# Patient Record
Sex: Female | Born: 1940
Health system: Southern US, Community
[De-identification: ages and names within clinical notes are randomized; demographics above are authoritative.]

## PROBLEM LIST (undated history)

## (undated) DIAGNOSIS — S43006A Unspecified dislocation of unspecified shoulder joint, initial encounter: Secondary | ICD-10-CM

## (undated) DIAGNOSIS — E785 Hyperlipidemia, unspecified: Secondary | ICD-10-CM

## (undated) DIAGNOSIS — M1710 Unilateral primary osteoarthritis, unspecified knee: Secondary | ICD-10-CM

## (undated) DIAGNOSIS — G4733 Obstructive sleep apnea (adult) (pediatric): Principal | ICD-10-CM

## (undated) DIAGNOSIS — M179 Osteoarthritis of knee, unspecified: Secondary | ICD-10-CM

## (undated) DIAGNOSIS — E119 Type 2 diabetes mellitus without complications: Secondary | ICD-10-CM

## (undated) DIAGNOSIS — I10 Essential (primary) hypertension: Secondary | ICD-10-CM

## (undated) HISTORY — DX: Obstructive sleep apnea (adult) (pediatric): G47.33

## (undated) HISTORY — DX: Unilateral primary osteoarthritis, unspecified knee: M17.10

## (undated) HISTORY — DX: Morbid (severe) obesity due to excess calories: E66.01

## (undated) HISTORY — DX: Type 2 diabetes mellitus without complications: E11.9

## (undated) HISTORY — DX: Unspecified dislocation of unspecified shoulder joint, initial encounter: S43.006A

## (undated) HISTORY — DX: Hyperlipidemia, unspecified: E78.5

## (undated) HISTORY — DX: Essential (primary) hypertension: I10

## (undated) HISTORY — DX: Osteoarthritis of knee, unspecified: M17.9

---

## 1977-11-14 HISTORY — PX: TUBAL LIGATION: SHX77

## 1987-11-15 HISTORY — PX: CHOLECYSTECTOMY: SHX55

## 1998-12-17 ENCOUNTER — Ambulatory Visit (HOSPITAL_COMMUNITY): Admission: RE | Admit: 1998-12-17 | Discharge: 1998-12-17 | Payer: Self-pay | Admitting: Internal Medicine

## 1998-12-17 ENCOUNTER — Encounter: Payer: Self-pay | Admitting: Internal Medicine

## 2000-01-31 ENCOUNTER — Other Ambulatory Visit: Admission: RE | Admit: 2000-01-31 | Discharge: 2000-01-31 | Payer: Self-pay | Admitting: Internal Medicine

## 2000-02-02 ENCOUNTER — Ambulatory Visit (HOSPITAL_COMMUNITY): Admission: RE | Admit: 2000-02-02 | Discharge: 2000-02-02 | Payer: Self-pay | Admitting: Internal Medicine

## 2000-02-02 ENCOUNTER — Encounter: Payer: Self-pay | Admitting: Internal Medicine

## 2002-10-25 ENCOUNTER — Other Ambulatory Visit: Admission: RE | Admit: 2002-10-25 | Discharge: 2002-10-25 | Payer: Self-pay | Admitting: Internal Medicine

## 2005-07-08 ENCOUNTER — Ambulatory Visit (HOSPITAL_COMMUNITY): Admission: RE | Admit: 2005-07-08 | Discharge: 2005-07-08 | Payer: Self-pay | Admitting: Internal Medicine

## 2006-08-11 ENCOUNTER — Ambulatory Visit (HOSPITAL_COMMUNITY): Admission: RE | Admit: 2006-08-11 | Discharge: 2006-08-11 | Payer: Self-pay | Admitting: Internal Medicine

## 2007-04-16 ENCOUNTER — Ambulatory Visit: Payer: Self-pay | Admitting: Gastroenterology

## 2007-04-30 ENCOUNTER — Ambulatory Visit: Payer: Self-pay | Admitting: Gastroenterology

## 2007-04-30 LAB — HM COLONOSCOPY

## 2008-08-18 ENCOUNTER — Ambulatory Visit (HOSPITAL_COMMUNITY): Admission: RE | Admit: 2008-08-18 | Discharge: 2008-08-18 | Payer: Self-pay | Admitting: Internal Medicine

## 2008-08-18 LAB — CONVERTED CEMR LAB

## 2008-09-16 ENCOUNTER — Ambulatory Visit: Payer: Self-pay | Admitting: Internal Medicine

## 2008-09-16 DIAGNOSIS — M545 Low back pain, unspecified: Secondary | ICD-10-CM | POA: Insufficient documentation

## 2008-09-16 DIAGNOSIS — E118 Type 2 diabetes mellitus with unspecified complications: Secondary | ICD-10-CM

## 2008-09-16 DIAGNOSIS — E785 Hyperlipidemia, unspecified: Secondary | ICD-10-CM

## 2008-09-16 DIAGNOSIS — I10 Essential (primary) hypertension: Secondary | ICD-10-CM | POA: Insufficient documentation

## 2008-09-16 LAB — CONVERTED CEMR LAB
AST: 21 units/L (ref 0–37)
Albumin: 4.2 g/dL (ref 3.5–5.2)
Alkaline Phosphatase: 82 units/L (ref 39–117)
BUN: 16 mg/dL (ref 6–23)
Cholesterol: 190 mg/dL (ref 0–200)
Creatinine, Ser: 1 mg/dL (ref 0.4–1.2)
GFR calc Af Amer: 72 mL/min
GFR calc non Af Amer: 59 mL/min
Glucose, Bld: 116 mg/dL — ABNORMAL HIGH (ref 70–99)
Hemoglobin: 14.2 g/dL (ref 12.0–15.0)
Hgb A1c MFr Bld: 6.5 % — ABNORMAL HIGH (ref 4.6–6.0)
LDL Cholesterol: 109 mg/dL — ABNORMAL HIGH (ref 0–99)
Lymphocytes Relative: 32 % (ref 12.0–46.0)
MCHC: 33.1 g/dL (ref 30.0–36.0)
Monocytes Relative: 7.4 % (ref 3.0–12.0)
Neutro Abs: 3.3 10*3/uL (ref 1.4–7.7)
Neutrophils Relative %: 57.7 % (ref 43.0–77.0)
Potassium: 4.2 meq/L (ref 3.5–5.1)
RBC: 5.02 M/uL (ref 3.87–5.11)
RDW: 13.5 % (ref 11.5–14.6)
Total CHOL/HDL Ratio: 3
Total Protein: 7.4 g/dL (ref 6.0–8.3)
VLDL: 17 mg/dL (ref 0–40)

## 2008-09-22 ENCOUNTER — Telehealth: Payer: Self-pay | Admitting: Internal Medicine

## 2008-11-14 HISTORY — PX: KNEE ARTHROSCOPY: SUR90

## 2009-05-11 ENCOUNTER — Encounter: Payer: Self-pay | Admitting: Internal Medicine

## 2009-05-13 ENCOUNTER — Telehealth: Payer: Self-pay | Admitting: Internal Medicine

## 2009-06-08 ENCOUNTER — Telehealth: Payer: Self-pay | Admitting: Internal Medicine

## 2009-06-23 ENCOUNTER — Emergency Department (HOSPITAL_COMMUNITY): Admission: EM | Admit: 2009-06-23 | Discharge: 2009-06-23 | Payer: Self-pay | Admitting: Internal Medicine

## 2009-07-24 ENCOUNTER — Encounter: Admission: RE | Admit: 2009-07-24 | Discharge: 2009-07-24 | Payer: Self-pay | Admitting: Orthopedic Surgery

## 2009-08-02 ENCOUNTER — Encounter: Admission: RE | Admit: 2009-08-02 | Discharge: 2009-08-02 | Payer: Self-pay | Admitting: Orthopedic Surgery

## 2009-11-27 ENCOUNTER — Emergency Department (HOSPITAL_COMMUNITY): Admission: EM | Admit: 2009-11-27 | Discharge: 2009-11-28 | Payer: Self-pay | Admitting: Emergency Medicine

## 2010-02-12 ENCOUNTER — Ambulatory Visit: Payer: Self-pay | Admitting: Internal Medicine

## 2010-02-12 DIAGNOSIS — S43006A Unspecified dislocation of unspecified shoulder joint, initial encounter: Secondary | ICD-10-CM | POA: Insufficient documentation

## 2010-02-12 DIAGNOSIS — M1710 Unilateral primary osteoarthritis, unspecified knee: Secondary | ICD-10-CM

## 2010-02-12 LAB — CONVERTED CEMR LAB
ALT: 18 units/L (ref 0–35)
Alkaline Phosphatase: 84 units/L (ref 39–117)
Basophils Relative: 0.4 % (ref 0.0–3.0)
Bilirubin, Direct: 0.1 mg/dL (ref 0.0–0.3)
CO2: 26 meq/L (ref 19–32)
Eosinophils Absolute: 0.2 10*3/uL (ref 0.0–0.7)
Eosinophils Relative: 3.1 % (ref 0.0–5.0)
GFR calc non Af Amer: 71.18 mL/min (ref >60)
Glucose, Bld: 121 mg/dL — ABNORMAL HIGH (ref 70–99)
HCT: 41.3 % (ref 36.0–46.0)
LDL Cholesterol: 103 mg/dL — ABNORMAL HIGH (ref 0–99)
MCHC: 34 g/dL (ref 30.0–36.0)
MCV: 83.9 fL (ref 78.0–100.0)
Monocytes Absolute: 0.5 10*3/uL (ref 0.1–1.0)
Potassium: 3.5 meq/L (ref 3.5–5.1)
RBC: 4.93 M/uL (ref 3.87–5.11)
Sodium: 134 meq/L — ABNORMAL LOW (ref 135–145)
TSH: 3.12 microintl units/mL (ref 0.35–5.50)
Total Bilirubin: 0.8 mg/dL (ref 0.3–1.2)
VLDL: 24.4 mg/dL (ref 0.0–40.0)
WBC: 6.2 10*3/uL (ref 4.5–10.5)

## 2010-06-04 ENCOUNTER — Telehealth: Payer: Self-pay | Admitting: Internal Medicine

## 2010-06-18 ENCOUNTER — Ambulatory Visit (HOSPITAL_COMMUNITY): Admission: RE | Admit: 2010-06-18 | Discharge: 2010-06-18 | Payer: Self-pay | Admitting: Internal Medicine

## 2010-06-24 ENCOUNTER — Encounter: Payer: Self-pay | Admitting: Internal Medicine

## 2010-06-24 ENCOUNTER — Encounter: Admission: RE | Admit: 2010-06-24 | Discharge: 2010-06-24 | Payer: Self-pay | Admitting: Internal Medicine

## 2010-06-24 LAB — HM MAMMOGRAPHY

## 2010-08-03 ENCOUNTER — Ambulatory Visit: Payer: Self-pay | Admitting: Internal Medicine

## 2010-08-05 ENCOUNTER — Encounter: Payer: Self-pay | Admitting: Internal Medicine

## 2010-08-27 ENCOUNTER — Ambulatory Visit: Payer: Self-pay | Admitting: Internal Medicine

## 2010-08-27 LAB — CONVERTED CEMR LAB
ALT: 34 units/L (ref 0–35)
AST: 26 units/L (ref 0–37)
Alkaline Phosphatase: 95 units/L (ref 39–117)
BUN: 17 mg/dL (ref 6–23)
Bilirubin, Direct: 0.2 mg/dL (ref 0.0–0.3)
Cholesterol: 212 mg/dL — ABNORMAL HIGH (ref 0–200)
Creatinine, Ser: 1.1 mg/dL (ref 0.4–1.2)
GFR calc non Af Amer: 66.44 mL/min (ref >60)
Hgb A1c MFr Bld: 7.6 % — ABNORMAL HIGH (ref 4.6–6.5)
Total Protein: 7.5 g/dL (ref 6.0–8.3)

## 2010-08-29 ENCOUNTER — Encounter: Payer: Self-pay | Admitting: Internal Medicine

## 2010-09-08 ENCOUNTER — Telehealth: Payer: Self-pay | Admitting: Internal Medicine

## 2010-12-14 NOTE — Progress Notes (Signed)
  Phone Note Refill Request Message from:  Fax from Pharmacy on June 04, 2010 8:23 AM  Refills Requested: Medication #1:  METFORMIN HCL 500 MG TABS Take 1 tablet by mouth two times a day  Medication #2:  LISINOPRIL-HYDROCHLOROTHIAZIDE 20-25 MG TABS Take 1 tablet by mouth once a day  Medication #3:  SIMVASTATIN 20 MG TABS Take 1 tablet by mouth once a day    Prescriptions: METFORMIN HCL 500 MG TABS (METFORMIN HCL) Take 1 tablet by mouth two times a day  #180 Each x 3   Entered by:   Ami Bullins CMA   Authorized by:   Jacques Navy MD   Signed by:   Bill Salinas CMA on 06/04/2010   Method used:   Electronically to        [facility] (retail)       [address] [place], [place]  [address]       Ph: [phone]       Fax: [phone]   RxID:   [id number] SIMVASTATIN 20 MG TABS (SIMVASTATIN) Take 1 tablet by mouth once a day  #90 Each x 3   Entered by:   Ami Bullins CMA   Authorized by:   Jacques Navy MD   Signed by:   Bill Salinas CMA on 06/04/2010   Method used:   Electronically to        [facility] (retail)       [address] [place], [place]  [address]       Ph: [phone]       Fax: [phone]   RxID:   [id number] LISINOPRIL-HYDROCHLOROTHIAZIDE 20-25 MG TABS (LISINOPRIL-HYDROCHLOROTHIAZIDE) Take 1 tablet by mouth once a day  #90 Each x 3   Entered by:   Ami Bullins CMA   Authorized by:   Jacques Navy MD   Signed by:   Bill Salinas CMA on 06/04/2010   Method used:   Electronically to        [facility] (retail)       [address] [place], [place]  [address]       Ph: [phone]       Fax: [phone]   RxID:   [id number]

## 2010-12-14 NOTE — Letter (Signed)
[facility] [address] [place], [place]  [address] Phone: [phone]      September 02, 2010   [facility] [address] [place], [place] [address]  RE:  LAB RESULTS  Dear  Ms. Muckle,  The following is an interpretation of your most recent lab tests.  Please take note of any instructions provided or changes to medications that have resulted from your lab work.  ELECTROLYTES:  Good - no changes needed  KIDNEY FUNCTION TESTS:  Good - no changes needed  Health professionals look at cholesterol as more involved than just the total cholesterol. We consider the level of LDL (bad) cholesterol, HDL (good), cholesterol, and Triglycerides (Grease) in the blood.  1. Your LDL should be under 100, and the HDL should be over 45, if you have any vascular disease such as heart attack, angina, stroke, TIA (mini stroke), claudication (pain in the legs when you walk due to poor circulation),  Abdominal Aortic Aneurysm (AAA), diabetes or prediabetes.  2. Your LDL should be under 130 if you have any two of the following:     a. Smoke or chew tobacco,     b. High blood pressure (if you are on medication or over 140/90 without medication),     c. Female gender,    d. HDL below 40,    e. A female relative (father, brother, or son), who have had any vascular event          as described in #1. above under the age of 69, or a female relative (mother,       sister, or daughter) who had an event as described above under age 60. (An HDL over 60 will subtract one risk factor from the total, so if you have two items in # 2 above, but an HDL over 60, you then fall into category # 3 below).  3. Your LDL should be under 160 if you have any one of the above.  Triglycerides should be under 200 with the ideal being under 150.  For diabetes or pre-diabetes, the ideal HgbA1C should be under 6.0%.  If you fall into any of the above categories, you should make a follow up appointment to  discuss this with your physician.  LIPID PANEL:  Please see the enclosed Rx for a change in medication Triglyceride: 128.0   Cholesterol: 212   LDL: 103   HDL: 58.70   Chol/HDL%:  4   DIABETIC STUDIES:  Please see the enclosed Rx for a change in medication Blood Glucose: 128   HgbA1C: 7.6      A1C is too high and cholesterol is not well controlled. Will increase simvastatin to 40mg  once a day for better cholesterol control and increase metformin to  1000mg  two times a day for better diabetes control. Of course you need to continue to work on life-style management with diet and exercise. Will repeat lab work in 3 months.   You may double up the doses on the medications you have left and new Rx's have been eScribed to your pharmacy.  Please come see me if you have any questions about these lab results.   Sincerely Yours,    Jacques Navy MD  Patient: Holly [name] Note: All result statuses are Final unless otherwise noted.  Tests: (1) Lipid Panel (LIPID)   Cholesterol          [H]  212 mg/dL  0-200     ATP III Classification            Desirable:  < 200 mg/dL                    Borderline High:  200 - 239 mg/dL               High:  > = 240 mg/dL   Triglycerides             128.0 mg/dL                 4.6-962.9     Normal:  <150 mg/dL     Borderline High:  528 - 199 mg/dL   HDL                       41.32 mg/dL                 >44.01   VLDL Cholesterol          25.6 mg/dL                  0.2-72.5  CHO/HDL Ratio:  CHD Risk                             4                    Men          Women     1/2 Average Risk     3.4          3.3     Average Risk          5.0          4.4     2X Average Risk          9.6          7.1     3X Average Risk          15.0          11.0                           Tests: (2) Hepatic/Liver Function Panel (HEPATIC)   Total Bilirubin      [H]  1.8 mg/dL                   3.6-6.4   Direct Bilirubin          0.2 mg/dL                    4.0-3.4   Alkaline Phosphatase      95 U/L                      39-117   AST                       26 U/L                      0-37   ALT                       34 U/L                      0-35   Total  Protein             7.5 g/dL                    5.5-7.3   Albumin                   4.1 g/dL                    2.2-0.2  Tests: (3) BMP (METABOL)   Sodium                    137 mEq/L                   135-145   Potassium                 4.1 mEq/L                   3.5-5.1   Chloride                  102 mEq/L                   96-112   Carbon Dioxide            27 mEq/L                    19-32   Glucose              [H]  128 mg/dL                   54-27   BUN                       17 mg/dL                    0-62   Creatinine                1.1 mg/dL                   3.7-6.2   Calcium                   9.6 mg/dL                   8.3-15.1   GFR                       66.44 mL/min                >60  Tests: (4) Hemoglobin A1C (A1C)   Hemoglobin A1C       [H]  7.6 %                       4.6-6.5     Glycemic Control Guidelines for People with Diabetes:     Non Diabetic:  <6%     Goal of Therapy: <7%     Additional Action Suggested:  >8%   Tests: (5) Cholesterol LDL - Direct (DIRLDL)  Cholesterol LDL - Direct                             134.8 mg/dL Prescriptions: SIMVASTATIN 40 MG TABS (SIMVASTATIN) 1 by mouth qPM for cholesterol control  #30 x 12   Entered and Authorized by:  Jacques Navy MD   Signed by:   Jacques Navy MD on 08/29/2010   Method used:   Electronically to        [facility] [address] (retail)       [phone]       [place], [place]  [address]       Ph: [phone]       Fax: [phone]   RxID:   [id number] METFORMIN HCL 1000 MG TABS (METFORMIN HCL) 1 by mouth two times a day for diabetes control  #60 x 12   Entered and Authorized by:   Jacques Navy MD   Signed by:   Jacques Navy MD on  08/29/2010   Method used:   Electronically to        [facility] [address] (retail)       [phone]       [place], [place]  [address]       Ph: [phone]       Fax: [phone]   RxID:   [id number]

## 2010-12-14 NOTE — Assessment & Plan Note (Signed)
Summary: YEARLY FU/ [facility] GOLD/  LABS SAME DAY OR LATER/NWS  #    Vital Signs:  Patient profile:   70 year old female Height:      69.5 inches (176.53 cm) Weight:      326.50 pounds (148.41 kg) BMI:     47.70 O2 Sat:      94 % on Room air Temp:     97.8 degrees F (36.56 degrees C) oral Pulse rate:   84 / minute BP sitting:   112 / 70  (left arm) Cuff size:   large  Vitals Entered By: Bill Salinas CMA (February 12, 2010 11:08 AM)/ Sydell Axon, SMA  O2 Flow:  Room air CC: 1 year follow up, pt had a flu shot 08/2009, pt states she had pneumovax at last visit, pt never had bone density, last mammogram was 09/02/08, last pap 08/18/08, per pt, eye exam is overdue. //[place]   Primary Care Provider:  Jacques Navy MD  CC:  1 year follow up, pt had a flu shot 08/2009, pt states she had pneumovax at last visit, pt never had bone density, last mammogram was 09/02/08, last pap 08/18/08, per pt, and eye exam is overdue. //[place].  History of Present Illness: Patients for follow-up of HTN, Lipids, DM. she was last seen in '09. she does monitor her BP at home which has been 120/70s, CBGs 130-90 most days. She has not had any health maintenance _ no mammogram and no labs. She does report having an appt for mammogram next week.     She had arthrsocopy left knee Sept '10 (Dr. Lajoyce Corners) - torn cartilage. She fell and sustained dislocated right shoulder requiring reduction under anesthesia [facility]). She had PT and has recovered normal range of motion.   Current Medications (verified): 1)  Claritin 10 Mg Tabs (Loratadine) .... Take 1 Tablet By Mouth Once A Day 2)  Lisinopril-Hydrochlorothiazide 20-25 Mg Tabs (Lisinopril-Hydrochlorothiazide) .... Take 1 Tablet By Mouth Once A Day 3)  Simvastatin 20 Mg Tabs (Simvastatin) .... Take 1 Tablet By Mouth Once A Day 4)  Metformin Hcl 500 Mg Tabs (Metformin Hcl) .... Take 1 Tablet By Mouth Two Times A Day 5)  Aspirin Adult Low Strength 81 Mg Tbec (Aspirin) .... Take 1  Tablet By Mouth Once A Day 6)  Vitamin D 400 Unit Tabs (Cholecalciferol) .... Take 1 Tablet By Mouth Once A Day 7)  Multivitamins   Tabs (Multiple Vitamin) .... Take 1 Tablet By Mouth Once A Day  Allergies (verified): 1)  ! Pcn  Past History:  Past Medical History: CLOSED DISLOCATION OF SHOULDER UNSPECIFIED SITE (ICD-831.00) DEGENERATIVE JOINT DISEASE, LEFT KNEE (ICD-715.96) ROUTINE GENERAL MEDICAL EXAM@HEALTH  CARE FACL (ICD-V70.0) OBESITY, CLASS III (ICD-278.01) LOW BACK PAIN, CHRONIC (ICD-724.2) HYPERTENSION (ICD-401.9) HYPERLIPIDEMIA (ICD-272.4) DIABETES-TYPE 2 (ICD-250.00)    G2P2  Past Surgical History: Tubal ligation (2376) Cholecystectomy (1989) Arthoscopy left Knee '10 Lajoyce Corners)  Family History: Reviewed history from 09/16/2008 and no changes required. Father died of kidney failure Mother died of massive MI one brother survived ruptured AAA second brother died from head and neck malignancy  Social History: Retired; worked on the [employer] at [employer], then at [employer] at [employer] married for 23 years, currently divorced  has a daughter born '65 and a son born '69; 2 grandchildren - 1 here, 1 in [place] exercises x 3days/week  Review of Systems       lost about 10 lbs.  Physical Exam  General:  Well-developed,well-nourished,in no acute distress; alert,appropriate and cooperative throughout examination Head:  Normocephalic and atraumatic without obvious abnormalities. No apparent alopecia or balding. Eyes:  vision grossly intact, pupils equal, pupils round, corneas and lenses clear, and no injection.   Ears:  R ear normal and L ear normal.   Nose:  no external deformity and no external erythema.   Mouth:  Oral mucosa and oropharynx without lesions or exudates.  Teeth in good repair. Neck:  supple, no masses, and no thyromegaly.   Chest Wall:  no deformities.   Breasts:  No mass, nodules, thickening, tenderness, bulging, retraction,  inflamation, nipple discharge or skin changes noted.   Lungs:  normal respiratory effort and normal breath sounds.   Heart:  normal rate, regular rhythm, and no murmur.   Abdomen:  soft, non-tender, and normal bowel sounds.   Msk:  normal ROM, no joint tenderness, no joint swelling, no joint warmth, and no joint deformities.   Pulses:  2+ radial Extremities:  No clubbing, cyanosis, edema, or deformity noted with normal full range of motion of all joints.   Neurologic:  alert & oriented X3, cranial nerves II-XII intact, strength normal in all extremities, gait normal, and DTRs symmetrical and normal.   Skin:  turgor normal, color normal, no rashes, and no suspicious lesions.   Cervical Nodes:  no anterior cervical adenopathy and no posterior cervical adenopathy.   Psych:  Oriented X3, memory intact for recent and remote, normally interactive, and good eye contact.     Impression & Recommendations:  Problem # 1:  CLOSED DISLOCATION OF SHOULDER UNSPECIFIED SITE (ICD-831.00) Good recovery  Problem # 2:  DEGENERATIVE JOINT DISEASE, LEFT KNEE (ICD-715.96) Doing well. Will check CBC since on asa.  Her updated medication list for this problem includes:    Aspirin Adult Low Strength 81 Mg Tbec (Aspirin) .Marland Kitchen... Take 1 tablet by mouth once a day  Orders: TLB-CBC Platelet - w/Differential (85025-CBCD)  Addendum - normal lab.  Problem # 3:  HYPERTENSION (ICD-401.9)  Her updated medication list for this problem includes:    Lisinopril-hydrochlorothiazide 20-25 Mg Tabs (Lisinopril-hydrochlorothiazide) .Marland Kitchen... Take 1 tablet by mouth once a day  Orders: TLB-BMP (Basic Metabolic Panel-BMET) (80048-METABOL)  BP today: 112/70 Prior BP: 138/84 (09/16/2008)  Excellent control.  Plan continue present regimen  Problem # 4:  HYPERLIPIDEMIA (ICD-272.4)  Her updated medication list for this problem includes:    Simvastatin 20 Mg Tabs (Simvastatin) .Marland Kitchen... Take 1 tablet by mouth once a  day  Orders: TLB-Lipid Panel (80061-LIPID) TLB-Hepatic/Liver Function Pnl (80076-HEPATIC) TLB-TSH (Thyroid Stimulating Hormone) (84443-TSH)  Addendum - LDL very close to goal of 440. No need to change medication. Needs to follow careful low fat diet.   Problem # 5:  DIABETES-TYPE 2 (ICD-250.00)  Her updated medication list for this problem includes:    Lisinopril-hydrochlorothiazide 20-25 Mg Tabs (Lisinopril-hydrochlorothiazide) .Marland Kitchen... Take 1 tablet by mouth once a day    Metformin Hcl 500 Mg Tabs (Metformin hcl) .Marland Kitchen... Take 1 tablet by mouth two times a day    Aspirin Adult Low Strength 81 Mg Tbec (Aspirin) .Marland Kitchen... Take 1 tablet by mouth once a day  Orders: TLB-A1C / Hgb A1C (Glycohemoglobin) (83036-A1C)  Addendum - A1C 6.6% indicating good control on present regimen.  Problem # 6:  Preventive Health Care (ICD-V70.0) Unremarkable history except for ortho. Normal limited physical exam. Lab  results within normal limits with no derangements. She is scheduled, by her report, for mammography. Last colonosocpy in '08.  In summary -  a nice woman with well controlled chronic medical problems. She is asked to return in 6 months and to keep a twice a year visit schedule. She is adamantly encouraged to get an opthal exam and to keep her appt for mammography.  Complete Medication List: 1)  Claritin 10 Mg Tabs (Loratadine) .... Take 1 tablet by mouth once a day 2)  Lisinopril-hydrochlorothiazide 20-25 Mg Tabs (Lisinopril-hydrochlorothiazide) .... Take 1 tablet by mouth once a day 3)  Simvastatin 20 Mg Tabs (Simvastatin) .... Take 1 tablet by mouth once a day 4)  Metformin Hcl 500 Mg Tabs (Metformin hcl) .... Take 1 tablet by mouth two times a day 5)  Aspirin Adult Low Strength 81 Mg Tbec (Aspirin) .... Take 1 tablet by mouth once a day 6)  Vitamin D 400 Unit Tabs (Cholecalciferol) .... Take 1 tablet by mouth once a day 7)  Multivitamins Tabs (Multiple vitamin) .... Take 1 tablet by mouth once a  day  Patient: Holly [name] Note: All result statuses are Final unless otherwise noted.  Tests: (1) BMP (METABOL)   Sodium               [L]  134 mEq/L                   135-145   Potassium                 3.5 mEq/L                   3.5-5.1   Chloride                  101 mEq/L                   96-112   Carbon Dioxide            26 mEq/L                    19-32   Glucose              [H]  121 mg/dL                   57-84   BUN                       17 mg/dL                    6-96   Creatinine                1.0 mg/dL                   2.9-5.2   Calcium                   9.6 mg/dL                   8.4-13.2   GFR                       71.18 mL/min                >60  Tests: (2) Lipid Panel (LIPID)   Cholesterol               188 mg/dL                   4-401     ATP  III Classification            Desirable:  < 200 mg/dL                    Borderline High:  200 - 239 mg/dL               High:  > = 240 mg/dL   Triglycerides             122.0 mg/dL                 7.3-220.2     Normal:  <150 mg/dL     Borderline High:  542 - 199 mg/dL   HDL                       70.62 mg/dL                 >37.62   VLDL Cholesterol          24.4 mg/dL                  8.3-15.1   LDL Cholesterol      [H]  761 mg/dL                   6-07  CHO/HDL Ratio:  CHD Risk                             3                    Men          Women     1/2 Average Risk     3.4          3.3     Average Risk          5.0          4.4     2X Average Risk          9.6          7.1     3X Average Risk          15.0          11.0                           Tests: (3) Hepatic/Liver Function Panel (HEPATIC)   Total Bilirubin           0.8 mg/dL                   3.7-1.0   Direct Bilirubin          0.1 mg/dL                   6.2-6.9   Alkaline Phosphatase      84 U/L                      39-117   AST                       19 U/L                      0-37   ALT  18 U/L                      0-35    Total Protein             7.8 g/dL                    6.2-1.3   Albumin                   4.1 g/dL                    0.8-6.5  Tests: (4) Hemoglobin A1C (A1C)   Hemoglobin A1C       [H]  6.6 %                       4.6-6.5     Glycemic Control Guidelines for People with Diabetes:     Non Diabetic:  <6%     Goal of Therapy: <7%     Additional Action Suggested:  >8%   Tests: (5) CBC Platelet w/Diff (CBCD)   White Cell Count          6.2 K/uL                    4.5-10.5   Red Cell Count            4.93 Mil/uL                 3.87-5.11   Hemoglobin                14.1 g/dL                   78.4-69.6   Hematocrit                41.3 %                      36.0-46.0   MCV                       83.9 fl                     78.0-100.0   MCHC                      34.0 g/dL                   29.5-28.4   RDW                       14.5 %                      11.5-14.6   Platelet Count            237.0 K/uL                  150.0-400.0   Neutrophil %              56.3 %                      43.0-77.0   Lymphocyte %              31.8 %  12.0-46.0   Monocyte %                8.4 %                       3.0-12.0   Eosinophils%              3.1 %                       0.0-5.0   Basophils %               0.4 %                       0.0-3.0   Neutrophill Absolute      3.5 K/uL                    1.4-7.7   Lymphocyte Absolute       2.0 K/uL                    0.7-4.0   Monocyte Absolute         0.5 K/uL                    0.1-1.0  Eosinophils, Absolute                             0.2 K/uL                    0.0-0.7   Basophils Absolute        0.0 K/uL                    0.0-0.1  Tests: (6) TSH (TSH)   FastTSH                   3.12 uIU/mL                 0.35-5.50

## 2010-12-14 NOTE — Progress Notes (Signed)
  Phone Note Call from Patient   Summary of Call: Patient is requesting a call back regarding cholesterol medication. Initial call taken by: Lamar Sprinkles, CMA,  September 08, 2010 9:09 AM  Follow-up for Phone Call        Clarified questions. Needs rx to go to mail order pharmacy Follow-up by: Lamar Sprinkles, CMA,  September 08, 2010 10:39 AM    Prescriptions: METFORMIN HCL 1000 MG TABS (METFORMIN HCL) 1 by mouth two times a day for diabetes control  #180 x 3   Entered by:   Lamar Sprinkles, CMA   Authorized by:   Jacques Navy MD   Signed by:   Lamar Sprinkles, CMA on 09/08/2010   Method used:   Faxed to ...       [facility] (mail-order)             , [place]         Ph: [phone]       Fax: [phone]   RxID:   [id number] SIMVASTATIN 40 MG TABS (SIMVASTATIN) 1 by mouth qPM for cholesterol control  #90 x 3   Entered by:   Lamar Sprinkles, CMA   Authorized by:   Jacques Navy MD   Signed by:   Lamar Sprinkles, CMA on 09/08/2010   Method used:   Faxed to ...       [facility] (mail-order)             , [place]         Ph: [phone]       Fax: [phone]   RxID:   [id number]

## 2010-12-14 NOTE — Progress Notes (Signed)
Summary: Jury Duty  Phone Note Call from Patient Call back at [facility] Phone [phone]   Summary of Call: Pt has jury summons. She wants to get out of it b/c she walks w/a limp from her knee surgery and has trouble w/steps and walking long distances.  Initial call taken by: Lamar Sprinkles, CMA,  September 08, 2010 10:40 AM  Follow-up for Phone Call        the courts will be glad to accomodate her. The courthouse has ramps.  Follow-up by: Jacques Navy MD,  September 08, 2010 2:01 PM  Additional Follow-up for Phone Call Additional follow up Details #1::        Pt informed  Additional Follow-up by: Lamar Sprinkles, CMA,  September 09, 2010 4:02 PM

## 2010-12-14 NOTE — Consult Note (Signed)
Summary: [facility]  [facility]   Imported By: Sherian Rein 08/20/2010 08:05:29  _____________________________________________________________________  External Attachment:    Type:   Image     Comment:   External Document

## 2010-12-14 NOTE — Assessment & Plan Note (Signed)
Summary: ?rash on inner leg/Sd   Vital Signs:  Patient profile:   70 year old female Height:      69.5 inches Weight:      334 pounds BMI:     48.79 O2 Sat:      97 % on Room air Temp:     97.3 degrees F oral Pulse rate:   76 / minute BP sitting:   126 / 78  (left arm) Cuff size:   large  Vitals Entered By: Ami Bullins CMA (August 03, 2010 11:23 AM)  O2 Flow:  Room air CC: pt has blotches with itching on both lower legs x 2 weeks/ ab   Primary Care Provider:  Jacques Navy MD  CC:  pt has blotches with itching on both lower legs x 2 weeks/ ab.  History of Present Illness: Patient presents with a 2 week history of a pruritic rash that started on the medial right ankle, extended up the leg to mid-calve and now involves the medial aspect distal left LE. This is very pruritic. she has not used any new creams or lotions or stockings or detergents. No pain.   Current Medications (verified): 1)  Claritin 10 Mg Tabs (Loratadine) .... Take 1 Tablet By Mouth Once A Day 2)  Lisinopril-Hydrochlorothiazide 20-25 Mg Tabs (Lisinopril-Hydrochlorothiazide) .... Take 1 Tablet By Mouth Once A Day 3)  Simvastatin 20 Mg Tabs (Simvastatin) .... Take 1 Tablet By Mouth Once A Day 4)  Metformin Hcl 500 Mg Tabs (Metformin Hcl) .... Take 1 Tablet By Mouth Two Times A Day 5)  Aspirin Adult Low Strength 81 Mg Tbec (Aspirin) .... Take 1 Tablet By Mouth Once A Day 6)  Vitamin D 400 Unit Tabs (Cholecalciferol) .... Take 1 Tablet By Mouth Once A Day 7)  Multivitamins   Tabs (Multiple Vitamin) .... Take 1 Tablet By Mouth Once A Day  Allergies (verified): 1)  ! Pcn  Past History:  Past Medical History: Last updated: 02/12/2010 CLOSED DISLOCATION OF SHOULDER UNSPECIFIED SITE (ICD-831.00) DEGENERATIVE JOINT DISEASE, LEFT KNEE (ICD-715.96) ROUTINE GENERAL MEDICAL EXAM@HEALTH  CARE FACL (ICD-V70.0) OBESITY, CLASS III (ICD-278.01) LOW BACK PAIN, CHRONIC (ICD-724.2) HYPERTENSION  (ICD-401.9) HYPERLIPIDEMIA (ICD-272.4) DIABETES-TYPE 2 (ICD-250.00)    G2P2  Past Surgical History: Last updated: 02/12/2010 Tubal ligation (1610) Cholecystectomy (1989) Arthoscopy left Knee '10 ([facility]) PSH reviewed for relevance, FH reviewed for relevance  Review of Systems       The patient complains of suspicious skin lesions.  The patient denies anorexia, fever, weight loss, chest pain, dyspnea on exertion, abdominal pain, unusual weight change, and enlarged lymph nodes.    Physical Exam  General:  heavyset AA female in no distress Head:  normocephalic and atraumatic.   Eyes:  corneas and lenses clear.   Lungs:  normal respiratory effort.   Heart:  normal rate and regular rhythm.   Skin:  macular rash medial aspect right ankle to mid-calve; similar rash left leg butit extend higher. NO open lesions or vesicles.   Impression & Recommendations:  Problem # 1:  UNSPECIFIED PRURITIC DISORDER (ICD-698.9) Patient with pruritic rash.  Plan Loratdine 10mg  two times a day (stop zyrtec) and ranitidine 150mg  two times a day for itching        refer to dermatology.   Orders: Dermatology Referral ([facility])  Complete Medication List: 1)  Claritin 10 Mg Tabs (Loratadine) .... Take 1 tablet by mouth once a day 2)  Lisinopril-hydrochlorothiazide 20-25 Mg Tabs (Lisinopril-hydrochlorothiazide) .... Take 1 tablet by mouth once a  day 3)  Simvastatin 20 Mg Tabs (Simvastatin) .... Take 1 tablet by mouth once a day 4)  Metformin Hcl 500 Mg Tabs (Metformin hcl) .... Take 1 tablet by mouth two times a day 5)  Aspirin Adult Low Strength 81 Mg Tbec (Aspirin) .... Take 1 tablet by mouth once a day 6)  Vitamin D 400 Unit Tabs (Cholecalciferol) .... Take 1 tablet by mouth once a day 7)  Multivitamins Tabs (Multiple vitamin) .... Take 1 tablet by mouth once a day  Patient Instructions: 1)  Itching rash on legs - will arrange referral to a dermatologist to help make the diagnosis and recommend  treatment. For itching take over the counter loratadine 10mg  two times a day and rantidine 150mg  two times a day. Cool soaks will also help. Continue all other meds.    Immunization History:  Influenza Immunization History:    Influenza:  historical (07/26/2010)

## 2010-12-14 NOTE — Assessment & Plan Note (Signed)
Summary: 6 month follow up-lb   Vital Signs:  Patient profile:   70 year old female Height:      69.5 inches Weight:      335 pounds BMI:     48.94 O2 Sat:      95 % on Room air Temp:     97.9 degrees F oral Pulse rate:   73 / minute BP sitting:   132 / 92  (left arm) Cuff size:   large  Vitals Entered By: Bill Salinas CMA (August 27, 2010 10:58 AM)  O2 Flow:  Room air CC: pt here for 6 month follow up/ ab   Primary Care Ruthann Angulo:  Jacques Navy MD  CC:  pt here for 6 month follow up/ ab.  History of Present Illness: Patinet reurns for medical follow-up with her last full exam in A;pril '11. In the interval she had a rash which dermatology dx'd as eczematous-dermatitis and is treating with traimcinolone 0.1% cream with good results. She is still having itching.  She has otherwise been healthy. No problems with medications. She is due for follow-up lab today.   Current Medications (verified): 1)  Claritin 10 Mg Tabs (Loratadine) .... Take 1 Tablet By Mouth Once A Day 2)  Lisinopril-Hydrochlorothiazide 20-25 Mg Tabs (Lisinopril-Hydrochlorothiazide) .... Take 1 Tablet By Mouth Once A Day 3)  Simvastatin 20 Mg Tabs (Simvastatin) .... Take 1 Tablet By Mouth Once A Day 4)  Metformin Hcl 500 Mg Tabs (Metformin Hcl) .... Take 1 Tablet By Mouth Two Times A Day 5)  Aspirin Adult Low Strength 81 Mg Tbec (Aspirin) .... Take 1 Tablet By Mouth Once A Day 6)  Vitamin D 400 Unit Tabs (Cholecalciferol) .... Take 1 Tablet By Mouth Once A Day 7)  Multivitamins   Tabs (Multiple Vitamin) .... Take 1 Tablet By Mouth Once A Day  Allergies (verified): 1)  ! Pcn  Past History:  Past Medical History: Last updated: 02/12/2010 CLOSED DISLOCATION OF SHOULDER UNSPECIFIED SITE (ICD-831.00) DEGENERATIVE JOINT DISEASE, LEFT KNEE (ICD-715.96) ROUTINE GENERAL MEDICAL EXAM@HEALTH  CARE FACL (ICD-V70.0) OBESITY, CLASS III (ICD-278.01) LOW BACK PAIN, CHRONIC (ICD-724.2) HYPERTENSION  (ICD-401.9) HYPERLIPIDEMIA (ICD-272.4) DIABETES-TYPE 2 (ICD-250.00)    G2P2  Past Surgical History: Last updated: 02/12/2010 Tubal ligation (1979) Cholecystectomy (1989) Arthoscopy left Knee '10 ([facility]) PSH reviewed for relevance, FH reviewed for relevance  Review of Systems  The patient denies anorexia, fever, weight loss, weight gain, decreased hearing, hoarseness, chest pain, dyspnea on exertion, prolonged cough, hemoptysis, abdominal pain, hematuria, muscle weakness, difficulty walking, and abnormal bleeding.    Physical Exam  General:  verweight woman in no distress Head:  normocephalic and no abnormalities observed.   Eyes:  vision grossly intact, pupils equal, and pupils round.   Ears:  R ear normal and L ear normal.   Nose:  no external deformity and no external erythema.   Neck:  supple and no thyromegaly.   Lungs:  normal respiratory effort and normal breath sounds.   Heart:  normal rate and regular rhythm.   Abdomen:  soft and normal bowel sounds.   Msk:  normal ROM, no joint tenderness, no joint warmth, and no redness over joints.   Pulses:  2+ radial Extremities:  no edema, no deformity Neurologic:  alert & oriented X3, cranial nerves II-XII intact, gait normal, and DTRs symmetrical and normal.   Skin:  turgor normal and color normal.   Cervical Nodes:  no anterior cervical adenopathy and no posterior cervical adenopathy.   Psych:  Oriented X3, normally interactive, and good eye contact.     Impression & Recommendations:  Problem # 1:  LOW BACK PAIN, CHRONIC (ICD-724.2) Stable. No limitation in ADLs  Her updated medication list for this problem includes:    Aspirin Adult Low Strength 81 Mg Tbec (Aspirin) .Marland Kitchen... Take 1 tablet by mouth once a day  Problem # 2:  HYPERTENSION (ICD-401.9)  Her updated medication list for this problem includes:    Lisinopril-hydrochlorothiazide 20-25 Mg Tabs (Lisinopril-hydrochlorothiazide) .Marland Kitchen... Take 1 tablet by mouth once a  day  Orders: TLB-BMP (Basic Metabolic Panel-BMET) (80048-METABOL)  BP today: 132/92 Prior BP: 126/78 (08/03/2010)  adequate control on present medications.  Problem # 3:  HYPERLIPIDEMIA (ICD-272.4) Due for routine follow-up lab with recommendations to follow.   Her updated medication list for this problem includes:    Simvastatin 40 Mg Tabs (Simvastatin) .Marland Kitchen... 1 by mouth qpm for cholesterol control  Orders: TLB-Lipid Panel (80061-LIPID) TLB-Hepatic/Liver Function Pnl (80076-HEPATIC)  Addendum - LDL 134.8 - above goal of 161 or less.   Plan - will increase simvastatin to 40mg  once daily.  Problem # 4:  DIABETES-TYPE 2 (ICD-250.00) Due for follow-up lab with recomendations to follow.  Her updated medication list for this problem includes:    Lisinopril-hydrochlorothiazide 20-25 Mg Tabs (Lisinopril-hydrochlorothiazide) .Marland Kitchen... Take 1 tablet by mouth once a day    Metformin Hcl 1000 Mg Tabs (Metformin hcl) .Marland Kitchen... 1 by mouth two times a day for diabetes control    Aspirin Adult Low Strength 81 Mg Tbec (Aspirin) .Marland Kitchen... Take 1 tablet by mouth once a day  Orders: TLB-BMP (Basic Metabolic Panel-BMET) (80048-METABOL) TLB-A1C / Hgb A1C (Glycohemoglobin) (83036-A1C)  Addendum - A1C 7.6% - above goal of 7% or less.  Plan - increase metformin to 1g two times a day.   Complete Medication List: 1)  Claritin 10 Mg Tabs (Loratadine) .... Take 1 tablet by mouth once a day 2)  Lisinopril-hydrochlorothiazide 20-25 Mg Tabs (Lisinopril-hydrochlorothiazide) .... Take 1 tablet by mouth once a day 3)  Simvastatin 40 Mg Tabs (Simvastatin) .Marland Kitchen.. 1 by mouth qpm for cholesterol control 4)  Metformin Hcl 1000 Mg Tabs (Metformin hcl) .Marland Kitchen.. 1 by mouth two times a day for diabetes control 5)  Aspirin Adult Low Strength 81 Mg Tbec (Aspirin) .... Take 1 tablet by mouth once a day 6)  Vitamin D 400 Unit Tabs (Cholecalciferol) .... Take 1 tablet by mouth once a day 7)  Multivitamins Tabs (Multiple vitamin) ....  Take 1 tablet by mouth once a day

## 2011-01-28 LAB — HM DIABETES FOOT EXAM: HM Diabetic Foot Exam: DECREASED

## 2011-02-28 ENCOUNTER — Other Ambulatory Visit (INDEPENDENT_AMBULATORY_CARE_PROVIDER_SITE_OTHER): Payer: Medicare PPO

## 2011-02-28 ENCOUNTER — Ambulatory Visit (HOSPITAL_COMMUNITY)
Admission: RE | Admit: 2011-02-28 | Discharge: 2011-02-28 | Disposition: A | Discharge status: Home / Self Care | Payer: Medicare PPO | Source: Ambulatory Visit | Attending: Internal Medicine | Admitting: Internal Medicine

## 2011-02-28 ENCOUNTER — Ambulatory Visit (INDEPENDENT_AMBULATORY_CARE_PROVIDER_SITE_OTHER): Payer: Medicare PPO | Admitting: Internal Medicine

## 2011-02-28 DIAGNOSIS — M25569 Pain in unspecified knee: Secondary | ICD-10-CM

## 2011-02-28 DIAGNOSIS — I10 Essential (primary) hypertension: Secondary | ICD-10-CM

## 2011-02-28 DIAGNOSIS — E785 Hyperlipidemia, unspecified: Secondary | ICD-10-CM

## 2011-02-28 DIAGNOSIS — E119 Type 2 diabetes mellitus without complications: Secondary | ICD-10-CM

## 2011-02-28 DIAGNOSIS — M25561 Pain in right knee: Secondary | ICD-10-CM

## 2011-02-28 DIAGNOSIS — M2569 Stiffness of other specified joint, not elsewhere classified: Secondary | ICD-10-CM | POA: Insufficient documentation

## 2011-02-28 LAB — COMPREHENSIVE METABOLIC PANEL
AST: 22 U/L (ref 0–37)
Albumin: 3.9 g/dL (ref 3.5–5.2)
Alkaline Phosphatase: 76 U/L (ref 39–117)
BUN: 20 mg/dL (ref 6–23)
Glucose, Bld: 102 mg/dL — ABNORMAL HIGH (ref 70–99)
Potassium: 4 mEq/L (ref 3.5–5.1)
Total Bilirubin: 1.4 mg/dL — ABNORMAL HIGH (ref 0.3–1.2)

## 2011-02-28 LAB — HEMOGLOBIN A1C: Hgb A1c MFr Bld: 7.1 % — ABNORMAL HIGH (ref 4.6–6.5)

## 2011-02-28 LAB — LIPID PANEL
Cholesterol: 193 mg/dL (ref 0–200)
HDL: 58.3 mg/dL (ref >39.00)
LDL Cholesterol: 108 mg/dL — ABNORMAL HIGH (ref 0–99)
Total CHOL/HDL Ratio: 3
VLDL: 26.8 mg/dL (ref 0.0–40.0)

## 2011-02-28 LAB — HEPATIC FUNCTION PANEL
ALT: 21 U/L (ref 0–35)
Alkaline Phosphatase: 76 U/L (ref 39–117)
Total Protein: 7.2 g/dL (ref 6.0–8.3)

## 2011-02-28 MED ORDER — METHYLPREDNISOLONE ACETATE 80 MG/ML IJ SUSP
80.0000 mg | Freq: Once | INTRAMUSCULAR | Status: AC
  Administered 2011-02-28: 80 mg via INTRA_ARTICULAR

## 2011-02-28 NOTE — Progress Notes (Signed)
Subjective:    Patient ID: Holly [name], female    DOB: Nov 24, 1940, 70 y.o.   MRN: [id number]  HPI Holly [name] presents for acute right knee pain over the past 6 months and is progressive. She has had a problem in the past after an injury and had good results with a steroid injection. She has tried OTC NSAIDs without much relief; tried lineaments. She does not have pain with weight bearing but it is hard to get up and down. There has been no swelling.   She reports that her DM has been variable controlled - which she attributes to decrease in exercise.     Review of Systems  Constitutional: Negative.   HENT: Negative.   Eyes: Negative.   Respiratory: Negative.  Negative for cough, shortness of breath and wheezing.   Cardiovascular: Negative.  Negative for chest pain and palpitations.  Gastrointestinal: Negative.   Musculoskeletal:       [Severe right knee pain without swelling Skin: Negative.   Neurological: Negative for dizziness, tremors, weakness and headaches.  Hematological: Negative.   Psychiatric/Behavioral: Negative.        Objective:   Physical Exam  Constitutional: She is oriented to person, place, and time.       Obese AA female in distress due to knee pain.   HENT:  Head: Normocephalic and atraumatic.  Eyes: Conjunctivae and EOM are normal. Pupils are equal, round, and reactive to light.  Neck: Normal range of motion. Neck supple. No thyromegaly present.  Cardiovascular: Normal rate, regular rhythm and normal heart sounds.   Pulmonary/Chest: Effort normal and breath sounds normal. She has no wheezes.  Musculoskeletal:       Right knee without effusion, erythema. ROM is 90% of normal with some limitation in full extension. Minor crepitus is noted. No instability of the knee  Neurological: She is alert and oriented to person, place, and time. She has normal reflexes.  Skin: Skin is warm and dry.  Psychiatric: She has a normal mood and affect. Her behavior is  normal.    Procedure Note :     Procedure :Joint Injection,   knee   Indication:  Joint osteoarthritis with refractory  chronic pain.   Risks including unsuccessful procedure , bleeding, infection, bruising, skin atrophy and others were explained to the patient in detail as well as the benefits. Informed consent was obtained and signed.   Tthe patient was placed in a comfortable position. Lateral approach was used. Skin was prepped with Betadine and alcohol  and anesthetized with 2 cc of 2% lidocaine and epinephrine, using a 23-gauge 1 inch needle. Then, a 5 cc syringe with a 1 inch long 3-gauge needle was used for a joint injection.. The needle was advanced  Into the knee joint cavity. I aspirated a small amount of intra-articular fluid to confirm correct placement of the needle and injected the joint with .5 mL of 2% lidocaine and 80 mg of Depo-Medrol .  Band-Aid was applied.   Tolerated well. Complications: None. Good pain relief following the procedure.   Postprocedure instructions :    A Band-Aid should be left on for 12 hours. Injection therapy is not a cure itself. It is used in conjunction with other modalities. You can use nonsteroidal anti-inflammatories like ibuprofen , hot and cold compresses. Rest is recommended in the next 24 hours. You need to report immediately  if fever, chills or any signs of infection develop.       Assessment & Plan:  1. Knee pain - suspect OA. Injection done with good results and relief of pain. She is sent for x-ray 2 views right knee.   2. Diabets- for lab today with recommendations to follow.   3. Hyperlipidemia - for lab today with recommendations to follow.  4. Hypertension - good control.

## 2011-03-06 ENCOUNTER — Encounter: Payer: Self-pay | Admitting: Internal Medicine

## 2011-03-06 ENCOUNTER — Telehealth: Payer: Self-pay | Admitting: Internal Medicine

## 2011-03-06 NOTE — Telephone Encounter (Signed)
Right knee x-rays compatible with degenerative joint disease.

## 2011-03-07 NOTE — Telephone Encounter (Signed)
Patient informed. 

## 2011-06-20 ENCOUNTER — Other Ambulatory Visit: Payer: Self-pay | Admitting: *Deleted

## 2011-06-20 MED ORDER — LISINOPRIL-HYDROCHLOROTHIAZIDE 20-25 MG PO TABS: 1.0000 | ORAL_TABLET | Freq: Every day | ORAL | Status: DC

## 2011-06-20 MED ORDER — METFORMIN HCL 1000 MG PO TABS: 1000.0000 mg | ORAL_TABLET | Freq: Two times a day (BID) | ORAL | Status: DC

## 2011-06-20 MED ORDER — SIMVASTATIN 40 MG PO TABS: 40.0000 mg | ORAL_TABLET | Freq: Every day | ORAL | Status: DC

## 2011-06-20 MED ORDER — LORATADINE 10 MG PO TABS: 10.0000 mg | ORAL_TABLET | Freq: Every day | ORAL | Status: DC

## 2011-06-20 NOTE — Telephone Encounter (Signed)
Patient informed. 

## 2011-06-20 NOTE — Telephone Encounter (Signed)
Patient requesting RF of Bp med, cholesterol, & diabetic meds to go to [facility]. She also would like a call back when complete.

## 2011-07-13 ENCOUNTER — Other Ambulatory Visit: Payer: Self-pay | Admitting: Internal Medicine

## 2011-07-13 DIAGNOSIS — Z1231 Encounter for screening mammogram for malignant neoplasm of breast: Secondary | ICD-10-CM

## 2011-07-19 ENCOUNTER — Other Ambulatory Visit (INDEPENDENT_AMBULATORY_CARE_PROVIDER_SITE_OTHER): Payer: Medicare PPO

## 2011-07-19 ENCOUNTER — Ambulatory Visit (INDEPENDENT_AMBULATORY_CARE_PROVIDER_SITE_OTHER): Payer: Medicare PPO | Admitting: Internal Medicine

## 2011-07-19 DIAGNOSIS — I10 Essential (primary) hypertension: Secondary | ICD-10-CM

## 2011-07-19 DIAGNOSIS — M1710 Unilateral primary osteoarthritis, unspecified knee: Secondary | ICD-10-CM

## 2011-07-19 DIAGNOSIS — M1711 Unilateral primary osteoarthritis, right knee: Secondary | ICD-10-CM | POA: Insufficient documentation

## 2011-07-19 DIAGNOSIS — E119 Type 2 diabetes mellitus without complications: Secondary | ICD-10-CM

## 2011-07-19 DIAGNOSIS — Z23 Encounter for immunization: Secondary | ICD-10-CM

## 2011-07-19 DIAGNOSIS — E785 Hyperlipidemia, unspecified: Secondary | ICD-10-CM

## 2011-07-19 LAB — HEPATIC FUNCTION PANEL
Albumin: 4.2 g/dL (ref 3.5–5.2)
Alkaline Phosphatase: 78 U/L (ref 39–117)
Bilirubin, Direct: 0.2 mg/dL (ref 0.0–0.3)

## 2011-07-19 LAB — COMPREHENSIVE METABOLIC PANEL
ALT: 15 U/L (ref 0–35)
Albumin: 4.2 g/dL (ref 3.5–5.2)
BUN: 18 mg/dL (ref 6–23)
CO2: 26 mEq/L (ref 19–32)
Calcium: 9.5 mg/dL (ref 8.4–10.5)
Chloride: 103 mEq/L (ref 96–112)
Creatinine, Ser: 0.9 mg/dL (ref 0.4–1.2)
GFR: 78.03 mL/min (ref >60.00)
Potassium: 3.8 mEq/L (ref 3.5–5.1)

## 2011-07-19 LAB — HEMOGLOBIN A1C: Hgb A1c MFr Bld: 7.4 % — ABNORMAL HIGH (ref 4.6–6.5)

## 2011-07-19 LAB — LIPID PANEL
LDL Cholesterol: 80 mg/dL (ref 0–99)
Total CHOL/HDL Ratio: 3

## 2011-07-19 MED ORDER — IBUPROFEN 400 MG PO TABS: 400.0000 mg | ORAL_TABLET | Freq: Three times a day (TID) | ORAL | Status: AC | PRN

## 2011-07-19 NOTE — Assessment & Plan Note (Signed)
She reports persistent pain in the right knee but is able to bear weight and has no limitations in activity.  Plan - continue NSAIDs - GI precautions           For failure to respond can consider synvisc injections.           Glucosamine

## 2011-07-19 NOTE — Patient Instructions (Signed)
Knee pain - by x-ray degenerative changes consistent with osteoarthritis. Plan - take ibuprofen 400mg  three times a day - be careful and watch for any stomach irritation or change in stools; glucosamine daily as instructed on the botte - an over the counter product; weight management/loss. For continued and unrelieved pain we can arrange for synvisc injections - a product that helps to replace some of the natural lubricant of the joint.   Knee Pain, General The knee is the complex joint between your thigh and your lower leg. It is made up of bones, tendons, ligaments, and cartilage. The bones that make up the knee are:  The femur in the thigh.   The tibia and fibula in the lower leg.   The patella or kneecap riding in the groove on the lower femur.  CAUSES Knee pain is a common complaint with many causes.  A few of these causes are:  Injury, such as:   A ruptured ligament or tendon injury.     Torn cartilage.       Medical conditions, such as:   Gout   Arthritis   Infections   Overuse, over training or overdoing a physical activity.  Knee pain can be minor or severe. Knee pain can accompany debilitating injury. Minor knee problems often respond well to self-care measures or get well on their own. More serious injuries may need medical intervention or even surgery. Symptoms The knee is complex. Symptoms of knee problems can vary widely. Some of the problems are:  Pain with movement and weight bearing.   Swelling and tenderness.     Buckling of the knee.     Inability to straighten or extend your knee.     Your knee locks and you cannot straighten it.   Warmth and redness with pain and fever.     Deformity or dislocation of the kneecap.     DIAGNOSIS Determining what is wrong may be very straight forward such as when there is an injury. It can also be challenging because of the complexity of the knee. Tests to make a diagnosis may include:  Your caregiver taking a  history and doing a physical exam.   Routine X-rays can be used to rule out other problems. X-rays will not reveal a cartilage tear. Some injuries of the knee can be diagnosed by:   Arthroscopy a surgical technique by which a small video camera is inserted through tiny incisions on the sides of the knee. This procedure is used to examine and repair internal knee joint problems. Tiny instruments can be used during arthroscopy to repair the torn knee cartilage (meniscus).   Arthrography is a radiology technique. A contrast liquid is directly injected into the knee joint. Internal structures of the knee joint then become visible on X-ray film.   An MRI scan is a non x-ray radiology procedure in which magnetic fields and a computer produce two- or three-dimensional images of the inside of the knee. Cartilage tears are often visible using an MRI scanner. MRI scans have largely replaced arthrography in diagnosing cartilage tears of the knee.   Blood work.   Examination of the fluid that helps to lubricate the knee joint (synovial fluid). This is done by taking a sample out using a needle and a syringe.  treatment The treatment of knee problems depends on the cause. Some of these treatments are:  Depending on the injury, proper casting, splinting, surgery or physical therapy care will be needed.   Give yourself  adequate recovery time. Do not overuse your joints. If you begin to get sore during workout routines, back off. Slow down or do fewer repetitions.   For repetitive activities such as cycling or running, maintain your strength and nutrition.   Alternate muscle groups. For example if you are a weight lifter, work the upper body on one day and the lower body the next.   Either tight or weak muscles do not give the proper support for your knee. Tight or weak muscles do not absorb the stress placed on the knee joint. Keep the muscles surrounding the knee strong.   Take care of mechanical  problems.   If you have flat feet, orthotics or special shoes may help. See your caregiver if you need help.   Arch supports, sometimes with wedges on the inner or outer aspect of the heel, can help. These can shift pressure away from the side of the knee most bothered by osteoarthritis.   A brace called an "unloader" brace also may be used to help ease the pressure on the most arthritic side of the knee.   If your caregiver has prescribed crutches, braces, wraps or ice, use as directed. The acronym for this is PRICE. This means protection, rest, ice, compression and elevation.   Nonsteroidal anti-inflammatory drugs (NSAID's), can help relieve pain. But if taken immediately after an injury, they may actually increase swelling. Take NSAID's with food in your stomach. Stop them if you develop stomach problems. Do not take these if you have a history of ulcers, stomach pain or bleeding from the bowel. Do not take without your caregiver's approval if you have problems with fluid retention, heart failure, or kidney problems.   For ongoing knee problems, physical therapy may be helpful.   Glucosamine and chondroitin are over-the-counter dietary supplements. Both may help relieve the pain of osteoarthritis in the knee. These medicines are different from the usual anti-inflammatory drugs. Glucosamine may decrease the rate of cartilage destruction.   Injections of a corticosteroid drug into your knee joint may help reduce the symptoms of an arthritis flare-up. They may provide pain relief that lasts a few months. You may have to wait a few months between injections. The injections do have a small increased risk of infection, water retention and elevated blood sugar levels.   Hyaluronic acid injected into damaged joints may ease pain and provide lubrication. These injections may work by reducing inflammation. A series of shots may give relief for as long as 6 months.   Topical painkillers. Applying certain  ointments to your skin may help relieve the pain and stiffness of osteoarthritis. Ask your pharmacist for suggestions. Many over the-counter products are approved for temporary relief of arthritis pain.   In some countries, doctors often prescribe topical NSAID's for relief of chronic conditions such as arthritis and tendinitis. A review of treatment with NSAID creams found that they worked as well as oral medications but without the serious side effects.  Prevention  Maintain a healthy weight. Extra pounds put more strain on your joints.   Get strong, stay limber. Weak muscles are a common cause of knee injuries. Stretching is important. Include flexibility exercises in your workouts.   Be smart about exercise. If you have osteoarthritis, chronic knee pain or recurring injuries, you may need to change the way you exercise. This does not mean you have to stop being active. If your knees ache after jogging or playing basketball, consider switching to swimming, water aerobics or other  low-impact activities, at least for a few days a week. Sometimes limiting high-impact activities will provide relief.   Make sure your shoes fit well. Choose footwear that is right for your sport.   Protect your knees. Use the proper gear for knee-sensitive activities. Use kneepads when playing volleyball or laying carpet. Buckle your seat belt every time you drive. Most shattered kneecaps occur in car accidents.   Rest when you are tired.  SEEK MEDICAL CARE IF: You have knee pain that is continual and does not seem to be getting better.   Seek IMMEDIATE MEDICAL CARE IF:  Your knee joint feels hot to the touch and you have a high fever. MAKE SURE YOU:    Understand these instructions.   Will watch your condition.   Will get help right away if you are not doing well or get worse.  Document Released: 08/28/2007 Document Re-Released: 01/25/2010 [facility] Patient Information 2011 [place], [place].

## 2011-07-19 NOTE — Progress Notes (Signed)
  Subjective:    Patient ID: Holly [name], female    DOB: [birth year], 70 y.o.   MRN: [id number]  HPI Holly [name] presents for continue pain in the right knee. She has had x-ray in April (reviewed) with significant degenerative changes. She had improvement with steroid injection in April. She is able to get around and is not ready to consult with an orthopedic surgeon. She is taking ibuprofen 400mg  once a day.   I have reviewed the patient's medical history in detail and updated the computerized patient record.    Review of Systems System review is negative for any constitutional, cardiac, pulmonary, GI or neuro symptoms or complaints     Objective:   Physical Exam Vitals - stable Gen'l - heavy set AA woman in no distress Chest - clear Cor - RRR Ext - no swelling of the knee, no effusion, normal gait and station  Lab Results  Component Value Date   WBC 6.2 02/12/2010   HGB 14.1 02/12/2010   HCT 41.3 02/12/2010   PLT 237.0 02/12/2010   CHOL 171 07/19/2011   TRIG 147.0 07/19/2011   HDL 61.20 07/19/2011   LDLDIRECT 134.8 08/27/2010   ALT 15 07/19/2011   ALT 15 07/19/2011   AST 21 07/19/2011   AST 21 07/19/2011   NA 139 07/19/2011   K 3.8 07/19/2011   CL 103 07/19/2011   CREATININE 0.9 07/19/2011   BUN 18 07/19/2011   CO2 26 07/19/2011   TSH 4.02 07/19/2011   HGBA1C 7.4* 07/19/2011   Lab Results  Component Value Date   LDLCALC 80 07/19/2011           Assessment & Plan:

## 2011-07-19 NOTE — Assessment & Plan Note (Signed)
BP Readings from Last 3 Encounters:  07/19/11 136/88  02/28/11 138/88  08/27/10 132/92   Good control on present medications. Renal function normal with Creatinine of 0.9  Plan - continue present medications

## 2011-07-19 NOTE — Assessment & Plan Note (Signed)
A1C 7.4 - acceptable control.  Plan - continue present regimen

## 2011-07-19 NOTE — Assessment & Plan Note (Signed)
Lab today with calculated LDL of 80 - great control and definitely below goal of 100.  Plan - continue present meds.

## 2011-07-21 ENCOUNTER — Ambulatory Visit (HOSPITAL_COMMUNITY)
Admission: RE | Admit: 2011-07-21 | Discharge: 2011-07-21 | Disposition: A | Discharge status: Home / Self Care | Payer: Medicare PPO | Source: Ambulatory Visit | Attending: Internal Medicine | Admitting: Internal Medicine

## 2011-07-21 DIAGNOSIS — Z1231 Encounter for screening mammogram for malignant neoplasm of breast: Secondary | ICD-10-CM | POA: Insufficient documentation

## 2011-07-25 ENCOUNTER — Encounter: Payer: Self-pay | Admitting: Internal Medicine

## 2011-10-13 ENCOUNTER — Telehealth: Payer: Self-pay | Admitting: *Deleted

## 2011-10-13 NOTE — Telephone Encounter (Signed)
Pt states that [facility] said her BP Rx was expired and we needed to call them at [phone].

## 2011-10-14 ENCOUNTER — Other Ambulatory Visit: Payer: Self-pay | Admitting: Internal Medicine

## 2011-10-17 ENCOUNTER — Telehealth: Payer: Self-pay

## 2011-10-17 NOTE — Telephone Encounter (Signed)
[facility] Triage Call Report Triage Record Num: [id number] Operator: Virgel Bouquet Patient Name: Holly [name] Call Date & Time: 10/15/2011 1:04:31PM Patient Phone: [phone] PCP: Illene Regulus Patient Gender: Female PCP Fax : [phone] Patient DOB: 07-06-1941 Practice Name: [facility] Reason for Call: Caller: Holly [name]; PCP: Illene Regulus; CB#: [phone]; Call Reason: Patient out of Blood pressure mediation. Pharmacy-[facility] [phone] . Lisinop-hctz 20-25mg . 1 daily. Will be 7-10 days before delivery pharmacy can deliver medication. 8 tabs called in per SO to get her through until delivery. Protocol(s) Used: Office Note Recommended Outcome per Protocol: Information Noted and Sent to Office Reason for Outcome: Caller information to office Care Advice: ~ 12/

## 2011-10-17 NOTE — Telephone Encounter (Signed)
Ok to renew Rxs

## 2012-01-16 ENCOUNTER — Ambulatory Visit: Payer: Medicare PPO | Admitting: Internal Medicine

## 2012-02-14 ENCOUNTER — Other Ambulatory Visit (INDEPENDENT_AMBULATORY_CARE_PROVIDER_SITE_OTHER): Payer: Medicare PPO

## 2012-02-14 ENCOUNTER — Encounter: Payer: Self-pay | Admitting: Internal Medicine

## 2012-02-14 ENCOUNTER — Ambulatory Visit (INDEPENDENT_AMBULATORY_CARE_PROVIDER_SITE_OTHER): Payer: Medicare PPO | Admitting: Internal Medicine

## 2012-02-14 VITALS — BP 142/84 | HR 108 | Temp 97.8°F | Resp 18 | Wt 350.0 lb

## 2012-02-14 DIAGNOSIS — Z23 Encounter for immunization: Secondary | ICD-10-CM

## 2012-02-14 DIAGNOSIS — M1710 Unilateral primary osteoarthritis, unspecified knee: Secondary | ICD-10-CM

## 2012-02-14 DIAGNOSIS — E785 Hyperlipidemia, unspecified: Secondary | ICD-10-CM

## 2012-02-14 DIAGNOSIS — I10 Essential (primary) hypertension: Secondary | ICD-10-CM

## 2012-02-14 DIAGNOSIS — E119 Type 2 diabetes mellitus without complications: Secondary | ICD-10-CM

## 2012-02-14 DIAGNOSIS — IMO0002 Reserved for concepts with insufficient information to code with codable children: Secondary | ICD-10-CM

## 2012-02-14 LAB — TSH: TSH: 0.31 u[IU]/mL — ABNORMAL LOW (ref 0.35–5.50)

## 2012-02-14 LAB — COMPREHENSIVE METABOLIC PANEL
Albumin: 4.2 g/dL (ref 3.5–5.2)
Alkaline Phosphatase: 80 U/L (ref 39–117)
BUN: 16 mg/dL (ref 6–23)
CO2: 31 mEq/L (ref 19–32)
GFR: 71.58 mL/min (ref >60.00)
Glucose, Bld: 161 mg/dL — ABNORMAL HIGH (ref 70–99)
Potassium: 3.8 mEq/L (ref 3.5–5.1)
Sodium: 138 mEq/L (ref 135–145)
Total Protein: 7.6 g/dL (ref 6.0–8.3)

## 2012-02-14 LAB — LIPID PANEL
HDL: 57.6 mg/dL (ref >39.00)
LDL Cholesterol: 52 mg/dL (ref 0–99)
Total CHOL/HDL Ratio: 2
Triglycerides: 120 mg/dL (ref 0.0–149.0)
VLDL: 24 mg/dL (ref 0.0–40.0)

## 2012-02-14 LAB — HEPATIC FUNCTION PANEL
Albumin: 4.2 g/dL (ref 3.5–5.2)
Total Bilirubin: 1.5 mg/dL — ABNORMAL HIGH (ref 0.3–1.2)

## 2012-02-14 LAB — HEMOGLOBIN A1C: Hgb A1c MFr Bld: 7.6 % — ABNORMAL HIGH (ref 4.6–6.5)

## 2012-02-14 NOTE — Assessment & Plan Note (Signed)
BP Readings from Last 3 Encounters:  02/14/12 142/84  07/19/11 136/88  02/28/11 138/88   subopitmal control. On ACE-I and diuretic.  Plan - weight loss.

## 2012-02-14 NOTE — Assessment & Plan Note (Signed)
Have discussed in the past: major health threat for OA, DM, HTN.  Encouraged to loose weight - smart food choices, PORTION SIZE control.

## 2012-02-14 NOTE — Assessment & Plan Note (Addendum)
Tolerating "statin" therapy w/o side effects.  Plan - lipid panel and LFTs with recommendations to follow  Addendum: LDL is above goal of 100 or less. Plan - better dietary adherence - very low fat diet and more exercise, e.g. Water based exercise to make it easier on her knees.           Follow-up lab in 3 months

## 2012-02-14 NOTE — Assessment & Plan Note (Addendum)
Reports she is doing well. There is evidence of peripheral neuropathy on exam.  Plan - A1C with recommendations to follow.  Addendum: A1C at 7.6% is above goal. Plan - continue present medications           Improve dietary adherence.           Repeat A1C 3 months

## 2012-02-14 NOTE — Progress Notes (Signed)
Subjective:    Patient ID: Holly [name], female    DOB: 06/07/1941, 71 y.o.   MRN: [id number]  HPI Mrs. Rodriges presents for routine medical follow-up. She has been doing ok - "samo samo." Her arthritis still bothers her. She is otherwise feeling good.   Past Medical History  Diagnosis Date  . Closed dislocation of shoulder     unspecified site  . DJD (degenerative joint disease) of knee     LEFT  . Obesity, morbid   . Hypertension   . Hyperlipidemia   . DM (diabetes mellitus)    Past Surgical History  Procedure Date  . Tubal ligation 1979  . Cholecystectomy 1989  . Knee arthroscopy 2010    LEFT/ Dr Lajoyce Corners   Family History  Problem Relation Age of Onset  . Heart disease Mother   . Kidney disease Father   . Aortic aneurysm Brother     Survived rupture   History   Social History  . Marital Status: Divorced    Spouse Name: N/A    Number of Children: 2  . Years of Education: N/A   Occupational History  . 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 Smoking status: Never Smoker   . Smokeless tobacco: Not on file  . Alcohol Use: Not on file  . Drug Use: Not on file  . Sexually Active: Not on file   Other Topics Concern  . Not on file   Social History Narrative   RETIREDMarried 23 yrs, currently divorcedDaughter born '65, Son born '69; 2 grandchildren - 1 [place], [place] 40 min x 3 days/wk       Review of Systems Constitutional:  Negative for fever, chills, activity change and unexpected weight change.  HEENT:  Negative for hearing loss, ear pain, congestion, neck stiffness and postnasal drip. Negative for sore throat or swallowing problems. Negative for dental complaints.   Eyes: Negative for vision loss or change in visual acuity.  Respiratory: Negative for chest tightness and wheezing. Negative for DOE.   Cardiovascular: Negative for chest pain or palpitations. No decreased exercise  tolerance Gastrointestinal: No change in bowel habit. No bloating or gas. No reflux or indigestion Genitourinary: Negative for urgency, frequency, flank pain and difficulty urinating.  Musculoskeletal: Negative for myalgias, back pain, arthralgias and gait problem.  Neurological: Negative for dizziness, tremors, weakness and headaches.  Hematological: Negative for adenopathy.  Psychiatric/Behavioral: Negative for behavioral problems and dysphoric mood.      Objective:   Physical Exam Filed Vitals:   02/14/12 0937  BP: 142/84  Pulse: 108  Temp: 97.8 F (36.6 C)  Resp: 18   Weight: 350 lb (158.759 kg)   Gen'l- obese AA woman in no distress HEENT- C&S clear, PERRLA, Fundi - deferred to OD exam August '12 Pulm - normal respirations, lungs CTAP Cor - 2+ radial and DP pulses, RRR Abd - obese Ext - nbo deformity, no peripheral edema Neuro - A&O x 3, CN II-XII normal, Decreased deep vibratory sensation ankle to toe, nl sensation light touch and pin-prick  Lab Results  Component Value Date   WBC 6.2 02/12/2010   HGB 14.1 02/12/2010   HCT 41.3 02/12/2010   PLT 237.0 02/12/2010   GLUCOSE 161* 02/14/2012   CHOL 134 02/14/2012   TRIG 120.0 02/14/2012   HDL 57.60 02/14/2012   LDLDIRECT 134.8 08/27/2010   LDLCALC 52 02/14/2012  ALT 28 02/14/2012   ALT 28 02/14/2012   AST 25 02/14/2012   AST 25 02/14/2012   NA 138 02/14/2012   K 3.8 02/14/2012   CL 99 02/14/2012   CREATININE 1.0 02/14/2012   BUN 16 02/14/2012   CO2 31 02/14/2012   TSH 0.31* 02/14/2012   HGBA1C 7.6* 02/14/2012         Assessment & Plan:

## 2012-02-14 NOTE — Assessment & Plan Note (Signed)
Stable with no worsening. Uses cane for ambulation.

## 2012-02-19 ENCOUNTER — Encounter: Payer: Self-pay | Admitting: Internal Medicine

## 2012-04-24 ENCOUNTER — Other Ambulatory Visit: Payer: Self-pay | Admitting: Internal Medicine

## 2012-04-25 NOTE — Telephone Encounter (Signed)
Medication refill sent to [facility]. Lisinopril/HCTZ

## 2012-05-30 ENCOUNTER — Ambulatory Visit (INDEPENDENT_AMBULATORY_CARE_PROVIDER_SITE_OTHER): Payer: Medicare PPO | Admitting: Internal Medicine

## 2012-05-30 ENCOUNTER — Encounter: Payer: Self-pay | Admitting: Internal Medicine

## 2012-05-30 VITALS — BP 140/98 | HR 84 | Temp 99.5°F | Ht 69.5 in | Wt 342.2 lb

## 2012-05-30 DIAGNOSIS — J019 Acute sinusitis, unspecified: Secondary | ICD-10-CM

## 2012-05-30 DIAGNOSIS — R05 Cough: Secondary | ICD-10-CM

## 2012-05-30 DIAGNOSIS — E119 Type 2 diabetes mellitus without complications: Secondary | ICD-10-CM

## 2012-05-30 MED ORDER — AZITHROMYCIN 250 MG PO TABS: ORAL_TABLET | ORAL | Status: AC

## 2012-05-30 MED ORDER — PROMETHAZINE-CODEINE 6.25-10 MG/5ML PO SYRP: 5.0000 mL | ORAL_SOLUTION | ORAL | Status: AC | PRN

## 2012-05-30 NOTE — Progress Notes (Signed)
  Subjective:    HPI  complains of cold symptoms  Onset 5 days ago, progressive symptoms  associated with rhinorrhea, sneezing, sore throat, mild frontal headache and low grade fever Also myalgias, sinus pressure and shortness of breath, cough - min sputum but thick and green mild relief with OTC meds - but symptoms still progressively worse Precipitated by sick contacts (grandson)  Past Medical History  Diagnosis Date  . Closed dislocation of shoulder     unspecified site  . DJD (degenerative joint disease) of knee     LEFT  . Obesity, morbid   . Hypertension   . Hyperlipidemia   . DM (diabetes mellitus)     Review of Systems Constitutional: No night sweats, no unexpected weight change Pulmonary: No pleurisy or hemoptysis Cardiovascular: No chest pain or palpitations     Objective:   Physical Exam BP 140/98  Pulse 84  Temp 99.5 F (37.5 C) (Oral)  Ht 5' 9.5" (1.765 m)  Wt 342 lb 3.2 oz (155.221 kg)  BMI 49.81 kg/m2  SpO2 95% GEN: overweight, mildly ill appearing and audible head/chest congestion HENT: NCAT, mild sinus tenderness bilaterally, nares without discharge, oropharynx mild erythema, no exudate Eyes: Vision grossly intact, no conjunctivitis Lungs: Clear to auscultation with few rhonchi, no wheeze, no increased work of breathing at rest but cough spasms in office Cardiovascular: Regular rate and rhythm, no bilateral edema  Lab Results  Component Value Date   WBC 6.2 02/12/2010   HGB 14.1 02/12/2010   HCT 41.3 02/12/2010   PLT 237.0 02/12/2010   GLUCOSE 161* 02/14/2012   CHOL 134 02/14/2012   TRIG 120.0 02/14/2012   HDL 57.60 02/14/2012   LDLDIRECT 134.8 08/27/2010   LDLCALC 52 02/14/2012   ALT 28 02/14/2012   ALT 28 02/14/2012   AST 25 02/14/2012   AST 25 02/14/2012   NA 138 02/14/2012   K 3.8 02/14/2012   CL 99 02/14/2012   CREATININE 1.0 02/14/2012   BUN 16 02/14/2012   CO2 31 02/14/2012   TSH 0.31* 02/14/2012   HGBA1C 7.6* 02/14/2012       Assessment & Plan:  Viral URI >  acute sinusitis  Cough, postnasal drip related to above DM2 - exac with acute illness    Empiric antibiotics prescribed due to symptom duration and comorbid dz Prescription cough suppression - new prescriptions done Symptomatic care with Tylenol or Advil, hydration and rest -  salt gargle advised as needed patient advised on anticipated increase in cbgs due to acute illness for next several days - pt will call if cbg>200

## 2012-05-30 NOTE — Patient Instructions (Addendum)
It was good to see you today. Zpak antibiotics and codeine cough syrup - Your prescription(s) have been submitted to your pharmacy. Please take as directed and contact our office if you believe you are  having problem(s) with the medication(s). Alternate between ibuprofen and tylenol for aches, pain and fever symptoms as discussed Watch your sugars and call if over 200 in next few days - being sick can will increase your sugars more than usual for next few days!  Salt Water Gargle This solution will help make your mouth and throat feel better. HOME CARE INSTRUCTIONS    Mix 1 teaspoon of salt in 8 ounces of warm water.   Gargle with this solution as much or often as you need or as directed. Swish and gargle gently if you have any sores or wounds in your mouth.   Do not swallow this mixture.  Document Released: 08/04/2004 Document Revised: 10/20/2011 Document Reviewed: 12/26/2008 [facility] Patient Information 2012 [place], [place].

## 2012-06-26 ENCOUNTER — Other Ambulatory Visit: Payer: Self-pay | Admitting: Internal Medicine

## 2012-07-25 ENCOUNTER — Other Ambulatory Visit: Payer: Self-pay | Admitting: Internal Medicine

## 2012-07-25 DIAGNOSIS — Z1231 Encounter for screening mammogram for malignant neoplasm of breast: Secondary | ICD-10-CM

## 2012-07-31 ENCOUNTER — Ambulatory Visit (HOSPITAL_COMMUNITY)
Admission: RE | Admit: 2012-07-31 | Discharge: 2012-07-31 | Disposition: A | Discharge status: Home / Self Care | Payer: Medicare PPO | Source: Ambulatory Visit | Attending: Internal Medicine | Admitting: Internal Medicine

## 2012-07-31 DIAGNOSIS — Z1231 Encounter for screening mammogram for malignant neoplasm of breast: Secondary | ICD-10-CM | POA: Insufficient documentation

## 2012-08-15 ENCOUNTER — Other Ambulatory Visit (INDEPENDENT_AMBULATORY_CARE_PROVIDER_SITE_OTHER): Payer: Medicare PPO

## 2012-08-15 ENCOUNTER — Encounter: Payer: Self-pay | Admitting: Internal Medicine

## 2012-08-15 ENCOUNTER — Ambulatory Visit (INDEPENDENT_AMBULATORY_CARE_PROVIDER_SITE_OTHER): Payer: Medicare PPO | Admitting: Internal Medicine

## 2012-08-15 VITALS — BP 134/84 | HR 85 | Temp 97.3°F | Resp 16 | Wt 334.0 lb

## 2012-08-15 DIAGNOSIS — E119 Type 2 diabetes mellitus without complications: Secondary | ICD-10-CM

## 2012-08-15 DIAGNOSIS — I10 Essential (primary) hypertension: Secondary | ICD-10-CM

## 2012-08-15 DIAGNOSIS — Z23 Encounter for immunization: Secondary | ICD-10-CM

## 2012-08-15 DIAGNOSIS — E785 Hyperlipidemia, unspecified: Secondary | ICD-10-CM

## 2012-08-15 LAB — HEPATIC FUNCTION PANEL
ALT: 15 U/L (ref 0–35)
AST: 18 U/L (ref 0–37)
Albumin: 4 g/dL (ref 3.5–5.2)
Alkaline Phosphatase: 74 U/L (ref 39–117)
Bilirubin, Direct: 0.2 mg/dL (ref 0.0–0.3)
Total Protein: 7.6 g/dL (ref 6.0–8.3)

## 2012-08-15 LAB — COMPREHENSIVE METABOLIC PANEL
ALT: 15 U/L (ref 0–35)
AST: 18 U/L (ref 0–37)
Alkaline Phosphatase: 74 U/L (ref 39–117)
BUN: 18 mg/dL (ref 6–23)
Chloride: 102 mEq/L (ref 96–112)
Creatinine, Ser: 1 mg/dL (ref 0.4–1.2)
Total Bilirubin: 1.2 mg/dL (ref 0.3–1.2)

## 2012-08-15 LAB — LIPID PANEL
HDL: 55.5 mg/dL (ref >39.00)
Triglycerides: 140 mg/dL (ref 0.0–149.0)
VLDL: 28 mg/dL (ref 0.0–40.0)

## 2012-08-15 NOTE — Patient Instructions (Addendum)
Keep up the good work: sticking to a NO sugar, low carb diet. Continue all your medications. Lab today: results by mail or MyChart  We just want you to be healthy

## 2012-08-17 NOTE — Assessment & Plan Note (Signed)
Holly [name] has lost 16 lbs since April '13 -   Plan - continue weight management program.

## 2012-08-17 NOTE — Assessment & Plan Note (Signed)
A1c @ 7.1% is close to goal.  Plan  Continue diet and exercise and weight loss  Continue present medical regimen.

## 2012-08-17 NOTE — Assessment & Plan Note (Signed)
BP Readings from Last 3 Encounters:  08/15/12 134/84  05/30/12 140/98  02/14/12 142/84   Adequate control on present regimen.  Plan - continue present medications.

## 2012-08-17 NOTE — Progress Notes (Signed)
Subjective:    Patient ID: Holly [name], female    DOB: 02/14/1941, 71 y.o.   MRN: [id number]  HPI Holly [name] presents for follow-up of DM, HTN and lipids. In the interval since her last visit in April she has been healthy, working on good dietary and exercise adherence but is limited by her co-morbidities including weight ,joint pain and stiffness and peripheral edema. She denies chest pain, change in respiratory function or GI complaints.  Past Medical History  Diagnosis Date  . Closed dislocation of shoulder     unspecified site  . DJD (degenerative joint disease) of knee     LEFT  . Obesity, morbid   . Hypertension   . Hyperlipidemia   . DM (diabetes mellitus)    Past Surgical History  Procedure Date  . Tubal ligation 1979  . Cholecystectomy 1989  . Knee arthroscopy 2010    LEFT/ Dr Lajoyce Corners   Family History  Problem Relation Age of Onset  . Heart disease Mother   . Kidney disease Father   . Aortic aneurysm Brother     Survived rupture   History   Social History  . Marital Status: Divorced    Spouse Name: N/A    Number of Children: 2  . Years of Education: N/A   Occupational History  . 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 Smoking status: Never Smoker   . Smokeless tobacco: Not on file  . Alcohol Use: Not on file  . Drug Use: Not on file  . Sexually Active: Not on file   Other Topics Concern  . Not on file   Social History Narrative   RETIREDMarried 23 yrs, currently divorcedDaughter born '65, Son born '69; 2 grandchildren - 1 [place], [place] 40 min x 3 days/wk    Current Outpatient Prescriptions on File Prior to Visit  Medication Sig Dispense Refill  . aspirin 81 MG EC tablet Take 81 mg by mouth daily.        . cetirizine (ZYRTEC ALLERGY) 10 MG tablet Take 10 mg by mouth daily.        . Cholecalciferol (VITAMIN D) 400 UNIT capsule Take 400 Units by mouth daily.        Marland Kitchen  lisinopril-hydrochlorothiazide (PRINZIDE,ZESTORETIC) 20-25 MG per tablet TAKE 1 TABLET DAILY  90 tablet  3  . loratadine (CLARITIN) 10 MG tablet Take 1 tablet (10 mg total) by mouth daily.  90 tablet  3  . metFORMIN (GLUCOPHAGE) 1000 MG tablet TAKE 1 TABLET TWICE DAILY FOR DIABETES CONTROL  180 tablet  0  . multivitamin (THERAGRAN) per tablet Take 1 tablet by mouth daily.        . simvastatin (ZOCOR) 40 MG tablet TAKE 1 TABLET EVERY EVENING FOR CHOLESTEROL CONTROL  90 tablet  0  . vitamin B-12 (CYANOCOBALAMIN) 100 MCG tablet Take 100 mcg by mouth daily.         Review of Systems System review is negative for any constitutional, cardiac, pulmonary, GI or neuro symptoms or complaints other than as described in the HPI.     Objective:   Physical Exam Filed Vitals:   08/15/12 0939  BP: 134/84  Pulse: 85  Temp: 97.3 F (36.3 C)  Resp: 16   Wt Readings from Last 3 Encounters:  08/15/12 334 lb (151.501 kg)  05/30/12 342 lb 3.2 oz (155.221 kg)  02/14/12 350  lb (158.759 kg)   Gen'l- obese AA woman in no distress HEENT- C&S clear, PERRLA Cor- 2+ radial pulse and RRR Pulm - no increased WOB, CTAP Neuro- A&Ox 3 , able to stand w/o assistance and has normal gait.   Lab Results  Component Value Date   WBC 6.2 02/12/2010   HGB 14.1 02/12/2010   HCT 41.3 02/12/2010   PLT 237.0 02/12/2010   GLUCOSE 116* 08/15/2012   CHOL 188 08/15/2012   TRIG 140.0 08/15/2012   HDL 55.50 08/15/2012   LDLDIRECT 134.8 08/27/2010   LDLCALC 105* 08/15/2012        ALT 15 08/15/2012   AST 18 08/15/2012        NA 138 08/15/2012   K 3.4* 08/15/2012   CL 102 08/15/2012   CREATININE 1.0 08/15/2012   BUN 18 08/15/2012   CO2 27 08/15/2012   TSH 0.31* 02/14/2012   HGBA1C 7.1* 08/15/2012          Assessment & Plan:

## 2012-08-17 NOTE — Assessment & Plan Note (Signed)
LDL is very close to goal of 100 or less; HDL is robust.  Plan Continue present medication.

## 2012-10-02 ENCOUNTER — Other Ambulatory Visit: Payer: Self-pay | Admitting: Internal Medicine

## 2012-11-12 ENCOUNTER — Telehealth: Payer: Self-pay | Admitting: Internal Medicine

## 2012-11-12 NOTE — Telephone Encounter (Signed)
Please call pt and schedule appt, to be seen as soon as possible.

## 2012-11-12 NOTE — Telephone Encounter (Signed)
Appt made for Dec 31.

## 2012-11-12 NOTE — Telephone Encounter (Signed)
Patient Information:  Caller Name: Javona  Phone: [phone]  Patient: Holly [name], Holly [name]  Gender: Female  DOB: 27-May-1941  Age: 71 Years  PCP: Illene Regulus (Adults only)  Office Follow Up:  Does the office need to follow up with this patient?: Yes  Instructions For The Office: Needs appt. for dry cough with exertional shortness of breath and wheezing /diabetic   Symptoms  Reason For Call & Symptoms: Has had a dry cough for about a week.  Has some exertional shortness of breath even with talking.  Reviewed Health History In EMR: Yes  Reviewed Medications In EMR: Yes  Reviewed Allergies In EMR: Yes  Reviewed Surgeries / Procedures: Yes  Date of Onset of Symptoms: 11/06/2012  Treatments Tried: Codeine promethizine cough syrup   Used her brothers inhaler -pro-air -this helped the most  Treatments Tried Worked: No  Guideline(s) Used:  Cough  Disposition Per Guideline:   Go to Office Now  Reason For Disposition Reached:   Wheezing is present  Advice Given:  Coughing Spasms:  Drink warm fluids. Inhale warm mist (Reason: both relax the airway and loosen up the phlegm).

## 2012-11-13 ENCOUNTER — Ambulatory Visit (INDEPENDENT_AMBULATORY_CARE_PROVIDER_SITE_OTHER): Payer: Medicare PPO | Admitting: Internal Medicine

## 2012-11-13 ENCOUNTER — Encounter: Payer: Self-pay | Admitting: Internal Medicine

## 2012-11-13 VITALS — BP 120/82 | HR 82 | Temp 98.3°F | Resp 10 | Wt 336.1 lb

## 2012-11-13 DIAGNOSIS — J069 Acute upper respiratory infection, unspecified: Secondary | ICD-10-CM

## 2012-11-13 MED ORDER — PROMETHAZINE-CODEINE 6.25-10 MG/5ML PO SYRP: 5.0000 mL | ORAL_SOLUTION | ORAL | Status: DC | PRN

## 2012-11-13 MED ORDER — BENZONATATE 100 MG PO CAPS: 100.0000 mg | ORAL_CAPSULE | Freq: Three times a day (TID) | ORAL | Status: DC | PRN

## 2012-11-13 NOTE — Patient Instructions (Addendum)
Viral Upper respiratory infection with cough - no sign of bacterial infection or need for antibiotics  Plan Phenergan with codiene for cough every 4 hrs   Tessalon perle 100 mg three times a day for cough  For sinus pressure it is ok to take sudafed (generic) 30 mg twice a day  Hydrate, take 1500 mg daily of vitamin C  Try to have a Happy New Year

## 2012-11-13 NOTE — Progress Notes (Signed)
  Subjective:    Patient ID: Holly [name], female    DOB: 1941-06-30, 71 y.o.   MRN: [id number]  HPI Mrs. Coral presents c/o upper respiratory congestion and persistent cough that is non-productive. She denies F/S/C. She has not had any N/V/D. No increase SOB  PMH, FamHx and SocHx reviewed for any changes and relevance. Current Outpatient Prescriptions on File Prior to Visit  Medication Sig Dispense Refill  . aspirin 81 MG EC tablet Take 81 mg by mouth daily.        . cetirizine (ZYRTEC ALLERGY) 10 MG tablet Take 10 mg by mouth daily.        . Cholecalciferol (VITAMIN D) 400 UNIT capsule Take 400 Units by mouth daily.        Marland Kitchen lisinopril-hydrochlorothiazide (PRINZIDE,ZESTORETIC) 20-25 MG per tablet TAKE 1 TABLET DAILY  90 tablet  3  . loratadine (CLARITIN) 10 MG tablet Take 1 tablet (10 mg total) by mouth daily.  90 tablet  3  . metFORMIN (GLUCOPHAGE) 1000 MG tablet TAKE 1 TABLET TWICE DAILY FOR DIABETES CONTROL  180 tablet  PRN  . multivitamin (THERAGRAN) per tablet Take 1 tablet by mouth daily.        . simvastatin (ZOCOR) 40 MG tablet TAKE 1 TABLET EVERY EVENING FOR CHOLESTEROL CONTROL  90 tablet  PRN  . vitamin B-12 (CYANOCOBALAMIN) 100 MCG tablet Take 100 mcg by mouth daily.         Review of Systems System review is negative for any constitutional, cardiac, pulmonary, GI or neuro symptoms or complaints other than as described in the HPI.     Objective:   Physical Exam Filed Vitals:   11/13/12 1537  BP: 120/82  Pulse: 82  Temp: 98.3 F (36.8 C)  Resp: 10   Gen'l- overweight AA woman in no distress HEENT- no sinus tenderness, throat is clear Cor- RRR Pulm - normal respirations, no rales, wheezes or rhonchi        Assessment & Plan:  Viral Upper respiratory infection with cough - no sign of bacterial infection or need for antibiotics  Plan Phenergan with codiene for cough every 4 hrs   Tessalon perle 100 mg three times a day for cough  For sinus pressure it is  ok to take sudafed (generic) 30 mg twice a day  Hydrate, take 1500 mg daily of vitamin C

## 2012-11-26 ENCOUNTER — Telehealth: Payer: Self-pay | Admitting: Internal Medicine

## 2012-11-26 NOTE — Telephone Encounter (Signed)
Patient Information:  Caller Name: Macala  Phone: [phone]  Patient: Holly, [name]  Gender: Female  DOB: 10-23-1941  Age: 72 Years  PCP: Illene Regulus (Adults only)  Office Follow Up:  Does the office need to follow up with this patient?: Yes  Instructions For The Office: OFFICE PLEASE FOLLOW UP WITH PATIENT IF MEDICATION CAN BE CALLED IN FOR PT  RN Note:  pt is also trying warm tea with honey nothing is helping  Symptoms  Reason For Call & Symptoms: pt reports she was seen in the office on 11/13/12 for cough.  Pt was given cough syrup with codeine and pills for cough.  pt reports cough is not any better.  Pt is not coughing anything up  Reviewed Health History In EMR: Yes  Reviewed Medications In EMR: Yes  Reviewed Allergies In EMR: Yes  Reviewed Surgeries / Procedures: Yes  Date of Onset of Symptoms: 11/13/2012  Treatments Tried: pt used brothers inhaler which seemed to help  Treatments Tried Worked: No  Guideline(s) Used:  Cough  Disposition Per Guideline:   See Today or Tomorrow in Office  Reason For Disposition Reached:   Continuous (nonstop) coughing interferes with work or school and no improvement using cough treatment per Care Advice  Advice Given:  N/A  Patient Refused Recommendation:  Patient Requests Prescription  pt states that she has had this before and was prescribed a Zpak for it,which seemed to help.

## 2012-11-27 NOTE — Telephone Encounter (Signed)
Can you scheduled this pt for an appointment-thanks.

## 2012-11-27 NOTE — Telephone Encounter (Signed)
OV today or tomorrow 

## 2013-03-01 ENCOUNTER — Other Ambulatory Visit: Payer: Self-pay | Admitting: Internal Medicine

## 2013-03-07 ENCOUNTER — Encounter: Payer: Self-pay | Admitting: Gastroenterology

## 2013-05-06 ENCOUNTER — Encounter: Payer: Self-pay | Admitting: Internal Medicine

## 2013-05-06 ENCOUNTER — Other Ambulatory Visit (INDEPENDENT_AMBULATORY_CARE_PROVIDER_SITE_OTHER): Payer: Medicare PPO

## 2013-05-06 ENCOUNTER — Ambulatory Visit (INDEPENDENT_AMBULATORY_CARE_PROVIDER_SITE_OTHER): Payer: Medicare PPO | Admitting: Internal Medicine

## 2013-05-06 VITALS — BP 158/90 | HR 87 | Temp 97.9°F | Ht 69.5 in | Wt 342.4 lb

## 2013-05-06 DIAGNOSIS — I10 Essential (primary) hypertension: Secondary | ICD-10-CM

## 2013-05-06 DIAGNOSIS — E785 Hyperlipidemia, unspecified: Secondary | ICD-10-CM

## 2013-05-06 DIAGNOSIS — Z0000 Encounter for general adult medical examination without abnormal findings: Secondary | ICD-10-CM

## 2013-05-06 DIAGNOSIS — E119 Type 2 diabetes mellitus without complications: Secondary | ICD-10-CM

## 2013-05-06 LAB — COMPREHENSIVE METABOLIC PANEL
ALT: 16 U/L (ref 0–35)
AST: 19 U/L (ref 0–37)
Albumin: 3.9 g/dL (ref 3.5–5.2)
BUN: 16 mg/dL (ref 6–23)
Calcium: 9 mg/dL (ref 8.4–10.5)
Chloride: 103 mEq/L (ref 96–112)
Potassium: 3.7 mEq/L (ref 3.5–5.1)

## 2013-05-06 LAB — HEMOGLOBIN A1C: Hgb A1c MFr Bld: 8.2 % — ABNORMAL HIGH (ref 4.6–6.5)

## 2013-05-06 LAB — HEPATIC FUNCTION PANEL
Albumin: 3.9 g/dL (ref 3.5–5.2)
Alkaline Phosphatase: 80 U/L (ref 39–117)

## 2013-05-06 LAB — TSH: TSH: 3.41 u[IU]/mL (ref 0.35–5.50)

## 2013-05-06 LAB — LIPID PANEL
HDL: 67.5 mg/dL (ref >39.00)
Total CHOL/HDL Ratio: 3

## 2013-05-06 MED ORDER — PIOGLITAZONE HCL 30 MG PO TABS: 30.0000 mg | ORAL_TABLET | Freq: Every day | ORAL | Status: DC

## 2013-05-06 NOTE — Progress Notes (Signed)
Subjective:    Patient ID: Holly [name], female    DOB: [birth year], 72 y.o.   MRN: [id number]  HPI Holly [name] presents annual Medicare wellness examination and management of other chronic and acute problems.   She is having itching and discomfort at the right EAC.  She reports CBGs have been in the 170-180 range. Last A1C October '13  7.1%   The risk factors are reflected in the social history.  The roster of all physicians providing medical care to patient - is listed in the Snapshot section of the chart.  Activities of daily living:  The patient is 100% inedpendent in all ADLs: dressing, toileting, feeding as well as independent mobility  Home safety : The patient has smoke detectors in the home. Falls - no falls in the last 12 months; has fall safe home. They wear seatbelts. Firearm in the home under lock and key. There is no violence in the home.   There is no risks for hepatitis, STDs or HIV. There is no history of blood transfusion. They have no travel history to infectious disease endemic areas of the world.  The patient has seen their dentist in the last six month. They have seen their eye doctor in the last year. They deny any hearing difficulty and have not had audiologic testing in the last year.    They do not  have excessive sun exposure. Discussed the need for sun protection: hats, long sleeves and use of sunscreen if there is significant sun exposure.   Diet: the importance of a healthy diet is discussed. They do have a healthy diet - tries to follow sugar free diet..  The patient has no regular exercise program.  The benefits of regular aerobic exercise were discussed.  Depression screen: there are no signs or vegative symptoms of depression- irritability, change in appetite, anhedonia, sadness/tearfullness.  Cognitive assessment: the patient manages all their financial and personal affairs and is actively engaged.   The following portions of the patient's history  were reviewed and updated as appropriate: allergies, current medications, past family history, past medical history,  past surgical history, past social history  and problem list.  Vision, hearing, body mass index were assessed and reviewed.   During the course of the visit the patient was educated and counseled about appropriate screening and preventive services including : fall prevention , diabetes screening, nutrition counseling, colorectal cancer screening, and recommended immunizations.  Past Medical History  Diagnosis Date  . Closed dislocation of shoulder     unspecified site  . DJD (degenerative joint disease) of knee     LEFT  . Obesity, morbid   . Hypertension   . Hyperlipidemia   . DM (diabetes mellitus)    Past Surgical History  Procedure Laterality Date  . Tubal ligation  1979  . Cholecystectomy  1989  . Knee arthroscopy  2010    LEFT/ Dr Lajoyce Corners   Family History  Problem Relation Age of Onset  . Heart disease Mother   . Kidney disease Father   . Aortic aneurysm Brother     Survived rupture   History   Social History  . Marital Status: Divorced    Spouse Name: N/A    Number of Children: 2  . Years of Education: N/A   Occupational History  . 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 Smoking status: Never Smoker   .  Smokeless tobacco: Not on file  . Alcohol Use: Not on file  . Drug Use: Not on file  . Sexually Active: Not on file   Other Topics Concern  . Not on file   Social History Narrative   RETIRED   Married 23 yrs, currently divorced   Daughter born '65, Son born '69; 2 grandchildren - 1 here, 1 in [place]      Current Outpatient Prescriptions on File Prior to Visit  Medication Sig Dispense Refill  . aspirin 81 MG EC tablet Take 81 mg by mouth daily.        . benzonatate (TESSALON) 100 MG capsule Take 1 capsule (100 mg total) by mouth 3 (three) times daily as needed for cough.  30  capsule  1  . cetirizine (ZYRTEC ALLERGY) 10 MG tablet Take 10 mg by mouth daily.        . Cholecalciferol (VITAMIN D) 400 UNIT capsule Take 400 Units by mouth daily.        Marland Kitchen lisinopril-hydrochlorothiazide (PRINZIDE,ZESTORETIC) 20-25 MG per tablet TAKE 1 TABLET DAILY  90 tablet  PRN  . metFORMIN (GLUCOPHAGE) 1000 MG tablet TAKE 1 TABLET TWICE DAILY FOR DIABETES CONTROL  180 tablet  PRN  . multivitamin (THERAGRAN) per tablet Take 1 tablet by mouth daily.        . simvastatin (ZOCOR) 40 MG tablet TAKE 1 TABLET EVERY EVENING FOR CHOLESTEROL CONTROL  90 tablet  PRN  . vitamin B-12 (CYANOCOBALAMIN) 100 MCG tablet Take 100 mcg by mouth daily.       No current facility-administered medications on file prior to visit.       Review of Systems Constitutional:  Negative for fever, chills, activity change and unexpected weight change.  HEENT:  Negative for hearing loss, ear pain, congestion, neck stiffness and postnasal drip. Negative for sore throat or swallowing problems. Negative for dental complaints.   Eyes: Negative for vision loss or change in visual acuity.  Respiratory: Negative for chest tightness and wheezing. Negative for DOE.   Cardiovascular: Negative for chest pain or palpitations. No decreased exercise tolerance Gastrointestinal: No change in bowel habit. No bloating or gas. No reflux or indigestion Genitourinary: Negative for urgency, frequency, flank pain and difficulty urinating.  Musculoskeletal: Negative for myalgias, back pain, and gait problem. Has bilateral knee pain, R>L Neurological: Negative for dizziness, tremors, weakness and headaches.  Hematological: Negative for adenopathy.  Psychiatric/Behavioral: Negative for behavioral problems and dysphoric mood.       Objective:   Physical Exam Filed Vitals:   05/06/13 0858  BP: 158/90  Pulse: 87  Temp: 97.9 F (36.6 C)   Wt Readings from Last 3 Encounters:  05/06/13 342 lb 6.4 oz (155.312 kg)  11/13/12 336 lb 1.9 oz  (152.463 kg)  08/15/12 334 lb (151.501 kg)   Gen'l: well nourished, well developed, obese, AA Woman in no distress HEENT - Cass/AT, EACs/TMs normal, oropharynx with native dentition in good condition but missing many molars and premolars, no buccal or palatal lesions, posterior pharynx clear, mucous membranes moist. C&S clear, PERRLA, fundi - normal Neck - supple, no thyromegaly Nodes- negative submental, cervical, supraclavicular regions Chest - no deformity, no CVAT Lungs - clear without rales, wheezes. No increased work of breathing Breast - - Skin normal, nipples w/o discharge, no fixed mass or lesion, many small fibrocystic lesions both breasts with no suspicious lesions, no axillary adenopathy. Cardiovascular - regular rate and rhythm, quiet precordium, no murmurs, rubs or gallops, 2+ radial, DP and  PT pulses Abdomen - BS+ x 4, no HSM, no guarding or rebound or tenderness Pelvic - deferred Rectal - deferred Extremities - no clubbing, cyanosis, edema or deformity.  Neuro - A&O x 3, CN II-XII normal, motor strength normal and equal, DTRs 2+ and symmetrical biceps, radial, and patellar tendons. Cerebellar - no tremor, no rigidity, fluid movement and normal gait. Derm - Head, neck, back, abdomen and extremities without suspicious lesions  Recent Results (from the past 2160 hour(s))  LIPID PANEL     Status: Abnormal   Collection Time    05/06/13 10:09 AM      Result Value Range   Cholesterol 176  0 - 200 mg/dL   Comment: ATP III Classification       Desirable:  < 200 mg/dL               Borderline High:  200 - 239 mg/dL          High:  > = 604 mg/dL   Triglycerides 540.9 (*) 0.0 - 149.0 mg/dL   Comment: Normal:  <811 mg/dLBorderline High:  150 - 199 mg/dL   HDL 91.47  >82.95 mg/dL   VLDL 62.1  0.0 - 30.8 mg/dL   LDL Cholesterol 76  0 - 99 mg/dL   Total CHOL/HDL Ratio 3     Comment:                Men          Women1/2 Average Risk     3.4          3.3Average Risk          5.0           4.42X Average Risk          9.6          7.13X Average Risk          15.0          11.0                      TSH     Status: None   Collection Time    05/06/13 10:09 AM      Result Value Range   TSH 3.41  0.35 - 5.50 uIU/mL  HEPATIC FUNCTION PANEL     Status: Abnormal   Collection Time    05/06/13 10:09 AM      Result Value Range   Total Bilirubin 1.4 (*) 0.3 - 1.2 mg/dL   Bilirubin, Direct 0.2  0.0 - 0.3 mg/dL   Alkaline Phosphatase 80  39 - 117 U/L   AST 19  0 - 37 U/L   ALT 16  0 - 35 U/L   Total Protein 7.4  6.0 - 8.3 g/dL   Albumin 3.9  3.5 - 5.2 g/dL  HEMOGLOBIN M5H     Status: Abnormal   Collection Time    05/06/13 10:09 AM      Result Value Range   Hemoglobin A1C 8.2 (*) 4.6 - 6.5 %   Comment: Glycemic Control Guidelines for People with Diabetes:Non Diabetic:  <6%Goal of Therapy: <7%Additional Action Suggested:  >8%   COMPREHENSIVE METABOLIC PANEL     Status: Abnormal   Collection Time    05/06/13 10:09 AM      Result Value Range   Sodium 139  135 - 145 mEq/L   Potassium 3.7  3.5 - 5.1 mEq/L   Chloride 103  96 -  112 mEq/L   CO2 27  19 - 32 mEq/L   Glucose, Bld 169 (*) 70 - 99 mg/dL   BUN 16  6 - 23 mg/dL   Creatinine, Ser 1.1  0.4 - 1.2 mg/dL   Total Bilirubin 1.4 (*) 0.3 - 1.2 mg/dL   Alkaline Phosphatase 80  39 - 117 U/L   AST 19  0 - 37 U/L   ALT 16  0 - 35 U/L   Total Protein 7.4  6.0 - 8.3 g/dL   Albumin 3.9  3.5 - 5.2 g/dL   Calcium 9.0  8.4 - 16.1 mg/dL   GFR 09.60  >45.40 mL/min         Assessment & Plan:

## 2013-05-06 NOTE — Assessment & Plan Note (Signed)
Advised to loose weight - see AVS

## 2013-05-06 NOTE — Assessment & Plan Note (Addendum)
For lab today with recommendations to follow. For now continue your present medication.  Addendum - LDL well controlled - better than goal of 100 or less

## 2013-05-06 NOTE — Assessment & Plan Note (Signed)
BP Readings from Last 3 Encounters:  05/06/13 158/90  11/13/12 120/82  08/15/12 134/84   Generally good control. Will have her monitor BP at home and report back if SBP > 140

## 2013-05-06 NOTE — Assessment & Plan Note (Addendum)
For A1C today with recommendations to follow.  Addendum - A1C 8.2% which is too high  Plan Redouble efforts to follow NO sugar, low carb diet  Regular exercise as discussed.  Add Actos 30 mg once a day to medications - Rx sent to [facility] pharmacy  Repeat A1C in 3 months (order entered)

## 2013-05-06 NOTE — Patient Instructions (Addendum)
Thanks for coming to see me.  Everything looks good. On the EKG you had HARMLESS premature contraction but no sign of any blockage or damage.  For itching ear: you can use sweet oil or a 50:50 mix of white vinegar and rubbing alcohol using a few drops in the ear twice a week until the itch is gone.  Weight - you need to loose weight: Diet management: smart food choices, PORTION SIZE CONTROL, regular exercise. Goal - to loose 1-2 lbs.month. Target weight - 200 lbs  You will get a report that will include your lab results. You will be called for anything really abnormal. If you use a computer please sign up for MyChart.  If all goes well I will see you in 6 month.

## 2013-05-06 NOTE — Assessment & Plan Note (Signed)
Interval history is w/o major medical illness, surgery or injury. Physical exam, sans pelvic, is normal except for weight issues. She is current with colorectal and breast cancer screening. Immunizations are up to date except for shingle - she will check on coverage.  In summary - a very nice woman who needs to work hard on life-style management of diabetes and obesity: Diet management: smart food choices, PORTION SIZE CONTROL, regular exercise. Goal - to loose 1-2 lbs.month. Target weight - 200 lbs

## 2013-06-05 ENCOUNTER — Encounter (HOSPITAL_COMMUNITY): Payer: Self-pay | Admitting: Emergency Medicine

## 2013-06-05 ENCOUNTER — Emergency Department (HOSPITAL_COMMUNITY)
Admission: EM | Admit: 2013-06-05 | Discharge: 2013-06-06 | Disposition: A | Discharge status: Home / Self Care | Payer: Medicare PPO | Attending: Emergency Medicine | Admitting: Emergency Medicine

## 2013-06-05 DIAGNOSIS — E119 Type 2 diabetes mellitus without complications: Secondary | ICD-10-CM | POA: Insufficient documentation

## 2013-06-05 DIAGNOSIS — Z87828 Personal history of other (healed) physical injury and trauma: Secondary | ICD-10-CM | POA: Insufficient documentation

## 2013-06-05 DIAGNOSIS — E785 Hyperlipidemia, unspecified: Secondary | ICD-10-CM | POA: Insufficient documentation

## 2013-06-05 DIAGNOSIS — Z8739 Personal history of other diseases of the musculoskeletal system and connective tissue: Secondary | ICD-10-CM | POA: Insufficient documentation

## 2013-06-05 DIAGNOSIS — I10 Essential (primary) hypertension: Secondary | ICD-10-CM | POA: Insufficient documentation

## 2013-06-05 DIAGNOSIS — Z7982 Long term (current) use of aspirin: Secondary | ICD-10-CM | POA: Insufficient documentation

## 2013-06-05 DIAGNOSIS — IMO0001 Reserved for inherently not codable concepts without codable children: Secondary | ICD-10-CM | POA: Insufficient documentation

## 2013-06-05 DIAGNOSIS — M79609 Pain in unspecified limb: Secondary | ICD-10-CM | POA: Insufficient documentation

## 2013-06-05 DIAGNOSIS — Z79899 Other long term (current) drug therapy: Secondary | ICD-10-CM | POA: Insufficient documentation

## 2013-06-05 DIAGNOSIS — M79661 Pain in right lower leg: Secondary | ICD-10-CM

## 2013-06-05 MED ORDER — ENOXAPARIN SODIUM 80 MG/0.8ML ~~LOC~~ SOLN
80.0000 mg | Freq: Once | SUBCUTANEOUS | Status: AC
  Administered 2013-06-05: 80 mg via SUBCUTANEOUS
  Filled 2013-06-05: qty 0.8

## 2013-06-05 MED ORDER — ENOXAPARIN SODIUM 80 MG/0.8ML ~~LOC~~ SOLN: 80.0000 mg | SUBCUTANEOUS | Status: DC

## 2013-06-05 NOTE — Progress Notes (Signed)
ANTICOAGULATION CONSULT NOTE - Initial Consult  Pharmacy Consult for Lovenox Indication:  DVT Prophylaxis  Allergies  Allergen Reactions  . Penicillins     REACTION: Hives, fever   Patient Measurements:    Wt Readings from Last 3 Encounters:  05/06/13 342 lb 6.4 oz (155.312 kg)  11/13/12 336 lb 1.9 oz (152.463 kg)  08/15/12 334 lb (151.501 kg)   Ht Readings from Last 3 Encounters:  05/06/13 5' 9.5" (1.765 m)  05/30/12 5' 9.5" (1.765 m)  02/28/11 5\' 8"  (1.727 m)   Ideal Body Weight (IBW): 67.35 kg Adjusted Body Weight: 102.6 kg Percent over ibw: 130.88 %  Vital Signs: Temp: 97.4 F (36.3 C) (07/23 2101) Temp src: Oral (07/23 2101) BP: 167/81 mmHg (07/23 2225) Pulse Rate: 75 (07/23 2225)   Medical History: Past Medical History  Diagnosis Date  . Closed dislocation of shoulder     unspecified site  . DJD (degenerative joint disease) of knee     Right knee  . Obesity, morbid   . Hypertension   . Hyperlipidemia   . DM (diabetes mellitus)     Assessment: 72 yo female admitted with c/o right calf pain.  She is morbidly obese with a TBW of 155 kg.  We have been asked to dose adjust her Lovenox for DVT prophylaxis.  Her last labs in June of this year revealed a normal creatinine.  She has no noted bleeding currently and her labs are in process.  Goal of Therapy:  DVT prevention  Plan:  1.  Begin 0.5mg /kg TBW (Lovenox 80mg ) SQ every 24 hours, this will need to be adjusted should she have an actual DVT. 2.  Monitor CBC, renal function  3.  F/U for the need to change to therapeutic vs. prophylaxis dosing.  Nadara Mustard, PharmD., MS Clinical Pharmacist Pager:  [phone] Thank you for allowing pharmacy to be part of this patients care team. 06/05/2013,11:31 PM

## 2013-06-05 NOTE — ED Provider Notes (Signed)
History    CSN: [id number] Arrival date & time 06/05/13  2053  First MD Initiated Contact with Patient 06/05/13 2244     Chief Complaint  Patient presents with  . Leg Pain   (Consider location/radiation/quality/duration/timing/severity/associated sxs/prior Treatment) HPI Comments: 72 y.o. Female with PMHx of morbid obesity, DM, HTN, and left knee arthroscopy presents today complaining of right posterior calf pain. Acute onset Monday when she woke up and stepped out of bed. Pt states pain is severe, worse with walking, better with rest, localized. Pt has taken no interventions. Pt denies a history of travel, immobilization, surgery, fevers, cancer, oral contraceptives or hormone use. The patient has no history of venous thromboembolism.    Patient is a 72 y.o. female presenting with leg pain.  Leg Pain Associated symptoms: no fever and no neck pain    Past Medical History  Diagnosis Date  . Closed dislocation of shoulder     unspecified site  . DJD (degenerative joint disease) of knee     Right knee  . Obesity, morbid   . Hypertension   . Hyperlipidemia   . DM (diabetes mellitus)    Past Surgical History  Procedure Laterality Date  . Tubal ligation  1979  . Cholecystectomy  1989  . Knee arthroscopy  2010    LEFT/ Dr Lajoyce Corners   Family History  Problem Relation Age of Onset  . Heart disease Mother   . Kidney disease Father   . Aortic aneurysm Brother     Survived rupture   History  Substance Use Topics  . Smoking status: Never Smoker   . Smokeless tobacco: Not on file  . Alcohol Use: Not on file   OB History   Grav Para Term Preterm Abortions TAB SAB Ect Mult Living   2 2             Review of Systems  Constitutional: Negative for fever and diaphoresis.  HENT: Negative for neck pain and neck stiffness.   Eyes: Negative for visual disturbance.  Respiratory: Negative for apnea, cough, chest tightness and shortness of breath.   Cardiovascular: Negative for chest  pain and palpitations.  Gastrointestinal: Negative for nausea, vomiting, diarrhea and constipation.  Genitourinary: Negative for dysuria.  Musculoskeletal: Positive for myalgias. Negative for gait problem.       Posterior calf pain  Skin: Negative for rash.  Neurological: Negative for dizziness, weakness, light-headedness, numbness and headaches.    Allergies  Penicillins  Home Medications   Current Outpatient Rx  Name  Route  Sig  Dispense  Refill  . aspirin 81 MG EC tablet   Oral   Take 81 mg by mouth daily.           . cetirizine (ZYRTEC ALLERGY) 10 MG tablet   Oral   Take 10 mg by mouth daily.           . Cholecalciferol (VITAMIN D) 400 UNIT capsule   Oral   Take 400 Units by mouth daily.           Marland Kitchen ibuprofen (ADVIL,MOTRIN) 200 MG tablet   Oral   Take 400-600 mg by mouth every 6 (six) hours as needed for pain.         Marland Kitchen lisinopril-hydrochlorothiazide (PRINZIDE,ZESTORETIC) 20-25 MG per tablet   Oral   Take 1 tablet by mouth daily.         . metFORMIN (GLUCOPHAGE) 1000 MG tablet   Oral   Take 1,000 mg by mouth 2 (  two) times daily with a meal.         . simvastatin (ZOCOR) 40 MG tablet   Oral   Take 40 mg by mouth at bedtime.          BP 167/81  Pulse 75  Temp(Src) 97.4 F (36.3 C) (Oral)  Resp 20  SpO2 97% Physical Exam  Nursing note and vitals reviewed. Constitutional: She is oriented to person, place, and time. No distress.  Morbidly obese  HENT:  Head: Normocephalic and atraumatic.  Eyes: Conjunctivae and EOM are normal.  Neck: Normal range of motion. Neck supple.  No meningeal signs  Cardiovascular: Normal rate, regular rhythm, normal heart sounds and intact distal pulses.  Exam reveals no gallop and no friction rub.   No murmur heard. Pulmonary/Chest: Effort normal and breath sounds normal. No respiratory distress. She has no wheezes. She has no rales. She exhibits no tenderness.  Abdominal: Soft. Bowel sounds are normal. She  exhibits no distension. There is no tenderness. There is no rebound and no guarding.  Musculoskeletal: Normal range of motion. She exhibits no edema and no tenderness.  FROM to upper and lower extremities   Neurological: She is alert and oriented to person, place, and time. No cranial nerve deficit.  Speech is clear and goal oriented, follows commands Sensation normal to light touch and two point discrimination Moves extremities without ataxia, coordination intact Normal strength in upper and lower extremities bilaterally including dorsiflexion and plantar flexion, strong and equal grip strength   Skin: Skin is warm and dry. She is not diaphoretic. No erythema.  Psychiatric: She has a normal mood and affect.    ED Course  Procedures (including critical care time) Labs Reviewed - No data to display No results found. 1. Pain of right lower leg     MDM  Although pt is perc negative, obesity and sedentary life style can pose a risk for DVT. No edema, erythema, or warmth. Good color. No mottling. Good cap refill. After hours for vascular ultrasound. Pt case discussed with and seen by Dr. Ignacia Palma who recommends dose of Lovenox with outpt follow up for ultrasound tomorrow morning at 9am. Left message with vascular lab and provided pt information on discharge papers. Discussed reasons to seek immediate care. Patient expresses understanding and agrees with plan.   Charis Juliana, PA-C 06/06/13 [phone]

## 2013-06-05 NOTE — ED Notes (Signed)
Right Leg pain since Monday am. Right lateral calf. No erythema or swelling. No increased pain with palpation. Negative homans. NO CP, SOB. PT endorses increased pain when standing

## 2013-06-06 ENCOUNTER — Ambulatory Visit (HOSPITAL_COMMUNITY)
Admission: RE | Admit: 2013-06-06 | Discharge: 2013-06-06 | Disposition: A | Discharge status: Home / Self Care | Payer: Medicare PPO | Source: Ambulatory Visit | Attending: Internal Medicine | Admitting: Internal Medicine

## 2013-06-06 ENCOUNTER — Telehealth: Payer: Self-pay | Admitting: *Deleted

## 2013-06-06 DIAGNOSIS — M79609 Pain in unspecified limb: Secondary | ICD-10-CM | POA: Insufficient documentation

## 2013-06-06 NOTE — Progress Notes (Addendum)
VASCULAR LAB PRELIMINARY  PRELIMINARY  PRELIMINARY  PRELIMINARY   Holly [name] October 07, 1941  Right lower extremity venous duplex completed.    Preliminary report:  Right:  No evidence of DVT, superficial thrombosis, or Baker's cyst.  Pastor Sgro, RVT 06/06/2013, 10:22 AM

## 2013-06-06 NOTE — Telephone Encounter (Signed)
[facility] Triage Call Report Triage Record Num: [id number] Operator: Alphonsa Overall Patient Name: Holly [name] Call Date & Time: 06/05/2013 7:58:20PM Patient Phone: [phone] PCP: Illene Regulus Patient Gender: Female PCP Fax : [phone] Patient DOB: [birth year] Practice Name: [facility] Reason for Call: [name] calling about right calf pain. Onset 06/03/13 am. Heating pad, drank vinegar for muscle spasm, Ibuprofen- nothing helping. Tingling pain in lower calf. No redness/swelling. Using cane to get up. Foot right slightly swollen. 6/10pain level. New onset to take unassisted weight bearing steps. See ED immediately. Care advice given per Leg Non injury Protocol. Pt will go to [facility]. Protocol(s) Used: Leg Non-Injury Recommended Outcome per Protocol: See ED Immediately Reason for Outcome: New onset of inability to take unassisted weight-bearing steps Care Advice: ~ Protect the patient from falling or other harm. ~ Another adult should drive. ~ Do not give the patient anything to eat or drink. ~ IMMEDIATE ACTION ~ DO NOT attempt to place any weight on the affected extremity until evaluated by provider. Write down provider's name. List or place the following in a bag for transport with the patient: current prescription and/or nonprescription medications; alternative treatments, therapies and medications; and street drugs. ~ ~ Support part in position of comfort to reduce pain and swelling; avoid unnecessary movement. 06/05/2013 8:21:30PM Page 1 of 1 [email or web address]

## 2013-06-06 NOTE — ED Provider Notes (Signed)
Medical screening examination/treatment/procedure(s) were conducted as a shared visit with non-physician practitioner(s) and myself.  I personally evaluated the patient during the encounter. Pt with right calf pain, suffering from right calf pain.  Advised Lovenox, venous doppler tomorrow morning.   Carleene Cooper III, MD 06/06/13 [phone]

## 2013-06-10 ENCOUNTER — Ambulatory Visit (INDEPENDENT_AMBULATORY_CARE_PROVIDER_SITE_OTHER): Payer: Medicare PPO | Admitting: Internal Medicine

## 2013-06-10 ENCOUNTER — Encounter: Payer: Self-pay | Admitting: Internal Medicine

## 2013-06-10 VITALS — BP 160/90 | HR 74 | Temp 97.9°F | Ht 69.0 in | Wt 342.4 lb

## 2013-06-10 DIAGNOSIS — M79609 Pain in unspecified limb: Secondary | ICD-10-CM

## 2013-06-10 DIAGNOSIS — M79604 Pain in right leg: Secondary | ICD-10-CM

## 2013-06-10 NOTE — Progress Notes (Signed)
  Subjective:    Patient ID: Holly [name], female    DOB: Feb 07, 1941, 72 y.o.   MRN: [id number]  HPI Patient was seen in the ED for the onset of severe right calve pain/cramping.  Hospital records reviewed. She did come to LE venous doppler July 24th that was negative for DVT. She is still having pain but it is not as bad. She does get some relief from ibuprofen. She has not had any injury or had any strain. There is no swelling.   Last lab June 23rd - normal Bmet including potassium, LFTs.  PMH, FamHx and SocHx reviewed for any changes and relevance. Current Outpatient Prescriptions on File Prior to Visit  Medication Sig Dispense Refill  . aspirin 81 MG EC tablet Take 81 mg by mouth daily.        . cetirizine (ZYRTEC ALLERGY) 10 MG tablet Take 10 mg by mouth daily.        . Cholecalciferol (VITAMIN D) 400 UNIT capsule Take 400 Units by mouth daily.        Marland Kitchen ibuprofen (ADVIL,MOTRIN) 200 MG tablet Take 400-600 mg by mouth every 6 (six) hours as needed for pain.      Marland Kitchen lisinopril-hydrochlorothiazide (PRINZIDE,ZESTORETIC) 20-25 MG per tablet Take 1 tablet by mouth daily.      . metFORMIN (GLUCOPHAGE) 1000 MG tablet Take 1,000 mg by mouth 2 (two) times daily with a meal.      . simvastatin (ZOCOR) 40 MG tablet Take 40 mg by mouth at bedtime.       No current facility-administered medications on file prior to visit.      Review of Systems System review is negative for any constitutional, cardiac, pulmonary, GI or neuro symptoms or complaints other than as described in the HPI.     Objective:   Physical Exam Filed Vitals:   06/10/13 1112  BP: 160/90  Pulse: 74  Temp: 97.9 F (36.6 C)   Wt Readings from Last 3 Encounters:  06/10/13 342 lb 6.4 oz (155.312 kg)  05/06/13 342 lb 6.4 oz (155.312 kg)  11/13/12 336 lb 1.9 oz (152.463 kg)   Gen'l - obese AA woman in no distress Cor- 2+ pulse Pulm - normal respirations Ext - no swelling of the calve right, no mass, no palpable muscle  spasm. Very tender along the achilles tendons in the distal calve.         Assessment & Plan:  Leg pain right - suspect muscle injury vs tendonitis.  PLan Continue rub of choice and heat  Continue ibuprofen every 6 hours  For continued pain after another week will need sports medicine follow up with u/s

## 2013-06-10 NOTE — Patient Instructions (Addendum)
Leg pain right - suspect muscle injury vs tendonitis.  PLan Continue rub of choice and heat  Continue ibuprofen every 6 hours  For continued pain after another week will need sports medicine follow up with u/s

## 2013-06-24 ENCOUNTER — Encounter: Payer: Self-pay | Admitting: Family Medicine

## 2013-06-24 ENCOUNTER — Ambulatory Visit (INDEPENDENT_AMBULATORY_CARE_PROVIDER_SITE_OTHER): Payer: Medicare PPO | Admitting: Family Medicine

## 2013-06-24 VITALS — BP 144/88 | HR 85 | Wt 341.0 lb

## 2013-06-24 DIAGNOSIS — S86119A Strain of other muscle(s) and tendon(s) of posterior muscle group at lower leg level, unspecified leg, initial encounter: Secondary | ICD-10-CM | POA: Insufficient documentation

## 2013-06-24 DIAGNOSIS — S86111A Strain of other muscle(s) and tendon(s) of posterior muscle group at lower leg level, right leg, initial encounter: Secondary | ICD-10-CM

## 2013-06-24 DIAGNOSIS — S838X9A Sprain of other specified parts of unspecified knee, initial encounter: Secondary | ICD-10-CM

## 2013-06-24 NOTE — Progress Notes (Signed)
I'm seeing this patient by the request  of:  Dr. Debby Bud  CC: Right calf pain  HPI: Patient is a very pleasant 72 year old female coming in with right calf pain. Patient does not remember any injury but does remember after going to the beach and walking on the sand but the next morning she had trouble with ambulation. Patient states that the pain started been approximately 3 weeks ago. Patient states since that time it is starting to improve slowly. Patient has been doing some stretching that she thinks has been beneficial. Patient states that the pain is mostly on the proximal lateral aspect of the calf and denies any significant radiation. Patient does have known history of degenerative arthritis of the right knee but states this feels like a different pain. Patient describes this as more of a nagging sensation that can catch her from time to time. Patient has been in doing with the aid of a cane. Patient has not tried ice or heat and has stopped her exercising until she came here. Patient puts the severity of 6/10.  Past medical, surgical, family and social history reviewed. Medications reviewed all in the electronic medical record.   Review of Systems: No headache, visual changes, nausea, vomiting, diarrhea, constipation, dizziness, abdominal pain, skin rash, fevers, chills, night sweats, weight loss, swollen lymph nodes, body aches, joint swelling, muscle aches, chest pain, shortness of breath, mood changes.   Objective:    Blood pressure 144/88, pulse 85, weight 341 lb (154.677 kg), SpO2 95.00%.   General: No apparent distress alert and oriented x3 mood and affect normal, dressed appropriately.  HEENT: Pupils equal, extraocular movements intact Respiratory: Patient's speak in full sentences and does not appear short of breath Cardiovascular: No lower extremity edema, non tender, no erythema Skin: Warm dry intact with no signs of infection or rash on extremities or on axial  skeleton. Abdomen: Soft nontender Neuro: Cranial nerves II through XII are intact, neurovascularly intact in all extremities with 2+ DTRs and 2+ pulses. Lymph: No lymphadenopathy of posterior or anterior cervical chain or axillae bilaterally.  Gait this patient with an antalgic gait walking with a cane keeping the foot in dorsiflexion on the right side. MSK: Non tender with full range of motion and good stability and symmetric strength and tone of shoulders, elbows, wrist and ankles bilaterally.  Right calf exam shows the patient is tender to palpation at the musculotendinous junction mostly on the lateral aspect. No defect palpated. Ankle: Right Trace effusion but no swelling. Range of motion is full in all directions. Strength is 5/5 in all directions. Stable lateral and medial ligaments; squeeze test and kleiger test unremarkable; Talar dome nontender; No pain at base of 5th MT; No tenderness over cuboid; No tenderness over N spot or navicular prominence No tenderness on posterior aspects of lateral and medial malleolus No sign of peroneal tendon subluxations or tenderness to palpation Negative tarsal tunnel tinel's Able to walk 4 steps.  Procedure: Limited ultrasound of right calf Device: GE logiq E. Findings: Patient's right calf does show an area of hypoechoic changes ray near the tendo muscular junction. This appears that this would be blood and seems to be reabsorbing. Patient does have an approximate 0.5 cm diameter tear in the lateral head at the musculotendinous junction. There is good neovascularization. It appears that the healing process is Arty started. Impression: Right calf tear Images permanently stored in the unit and are available for review. Impression and Recommendations:     This case  required medical decision making of moderate complexity.

## 2013-06-24 NOTE — Patient Instructions (Signed)
Very nice to meet you Try the compression.  I think it will help I also want you to get a calf compression sleeve to try.  You can get it at [employer], or other sporting goods store.  Do the exercises daily Pool exercises are great. Ice 20 minute 2 times a day Heat before stretching.  Come back again in 3-4 weeks.  At that time if not much bette or knee is worse.

## 2013-06-24 NOTE — Assessment & Plan Note (Signed)
Discussed at length about prognosis and diagnosis. Home exercise program given today. Ace bandage wrap applied and given patient extra and shown how this could be helpful. Discuss getting compression sleeve over-the-counter that can be helpful as well. Icing and ibuprofen protocol suggested. Encouraged pool exercises. Return in 3 weeks for further evaluation. He continued to have pain would look for referred pain such as from her right knee DJD. At followup would get x-rays of knee.

## 2013-07-31 LAB — HM MAMMOGRAPHY

## 2013-08-02 ENCOUNTER — Other Ambulatory Visit: Payer: Self-pay | Admitting: Internal Medicine

## 2013-08-12 ENCOUNTER — Other Ambulatory Visit: Payer: Self-pay | Admitting: Internal Medicine

## 2013-08-12 DIAGNOSIS — Z1231 Encounter for screening mammogram for malignant neoplasm of breast: Secondary | ICD-10-CM

## 2013-08-16 ENCOUNTER — Ambulatory Visit (HOSPITAL_COMMUNITY)
Admission: RE | Admit: 2013-08-16 | Discharge: 2013-08-16 | Disposition: A | Discharge status: Home / Self Care | Payer: Medicare PPO | Source: Ambulatory Visit | Attending: Internal Medicine | Admitting: Internal Medicine

## 2013-08-16 DIAGNOSIS — Z1231 Encounter for screening mammogram for malignant neoplasm of breast: Secondary | ICD-10-CM | POA: Insufficient documentation

## 2013-09-04 ENCOUNTER — Ambulatory Visit (INDEPENDENT_AMBULATORY_CARE_PROVIDER_SITE_OTHER): Payer: Medicare PPO

## 2013-09-04 DIAGNOSIS — Z23 Encounter for immunization: Secondary | ICD-10-CM

## 2013-11-05 ENCOUNTER — Other Ambulatory Visit (INDEPENDENT_AMBULATORY_CARE_PROVIDER_SITE_OTHER): Payer: Medicare PPO

## 2013-11-05 ENCOUNTER — Ambulatory Visit (INDEPENDENT_AMBULATORY_CARE_PROVIDER_SITE_OTHER): Payer: Medicare PPO | Admitting: Internal Medicine

## 2013-11-05 ENCOUNTER — Encounter: Payer: Self-pay | Admitting: Internal Medicine

## 2013-11-05 VITALS — BP 150/98 | HR 84 | Temp 98.1°F | Wt 340.0 lb

## 2013-11-05 DIAGNOSIS — R32 Unspecified urinary incontinence: Secondary | ICD-10-CM

## 2013-11-05 DIAGNOSIS — E785 Hyperlipidemia, unspecified: Secondary | ICD-10-CM

## 2013-11-05 DIAGNOSIS — E119 Type 2 diabetes mellitus without complications: Secondary | ICD-10-CM

## 2013-11-05 DIAGNOSIS — I10 Essential (primary) hypertension: Secondary | ICD-10-CM

## 2013-11-05 LAB — BASIC METABOLIC PANEL
Calcium: 9.5 mg/dL (ref 8.4–10.5)
GFR: 71.22 mL/min (ref >60.00)
Glucose, Bld: 141 mg/dL — ABNORMAL HIGH (ref 70–99)
Potassium: 3.6 mEq/L (ref 3.5–5.1)
Sodium: 139 mEq/L (ref 135–145)

## 2013-11-05 LAB — HEPATIC FUNCTION PANEL
AST: 17 U/L (ref 0–37)
Albumin: 4.2 g/dL (ref 3.5–5.2)
Alkaline Phosphatase: 74 U/L (ref 39–117)
Bilirubin, Direct: 0.2 mg/dL (ref 0.0–0.3)
Total Bilirubin: 1.3 mg/dL — ABNORMAL HIGH (ref 0.3–1.2)

## 2013-11-05 LAB — LIPID PANEL
HDL: 59.7 mg/dL (ref >39.00)
LDL Cholesterol: 95 mg/dL (ref 0–99)
VLDL: 33.8 mg/dL (ref 0.0–40.0)

## 2013-11-05 MED ORDER — AMLODIPINE BESYLATE 10 MG PO TABS: 10.0000 mg | ORAL_TABLET | Freq: Every day | ORAL | Status: DC

## 2013-11-05 NOTE — Patient Instructions (Signed)
Happy Holidays  Your blood pressure is running up a little. BP Readings from Last 3 Encounters:  11/05/13 150/98  06/24/13 144/88  06/10/13 160/90   Plan Continue lisinopril/hct  Add amlodipine 10mg  once a day  Diabetes - your last A1C was 7.5% with a goal of 7%.  Plan  No change in medication  Please be on a strict NO SUGAR, low carb diet   Weight management - Diet management: smart food choices, PORTION SIZE CONTROL, regular exercise. Goal - to loose 1-2 lbs.month. Target weight - 300 lbs. This will still be way over weight but it will make a big difference in diabetes and blood pressure management. This should take you 3 years!!

## 2013-11-05 NOTE — Progress Notes (Signed)
Pre visit review using our clinic review tool, if applicable. No additional management support is needed unless otherwise documented below in the visit note. 

## 2013-11-05 NOTE — Progress Notes (Signed)
   Subjective:    Patient ID: Holly [name], female    DOB: 01-Nov-1941, 72 y.o.   MRN: [id number]  HPI Holly [name] presents for follow-up. She continues to have TMJ right with pain. She has continence which is getting worse. She reports CBGs in the 140's. BP has been running high.  She did have [employer] NP home visit with full exam and lab: A1C October '14 was 7.5%; Fecal immunochemical test - negative. She did have an eye exam, [place], slight glaucoma, no diabetic changes (report pending).    Review of Systems     Objective:   Physical Exam        Assessment & Plan:  h

## 2013-11-06 DIAGNOSIS — R32 Unspecified urinary incontinence: Secondary | ICD-10-CM | POA: Insufficient documentation

## 2013-11-06 NOTE — Assessment & Plan Note (Signed)
Patient c/o incontinence - unable to be specific in her complaint  Plan Refer to [facility] for incontinence clinic evaluation.

## 2013-11-06 NOTE — Assessment & Plan Note (Addendum)
BP Readings from Last 3 Encounters:  11/05/13 150/98  06/24/13 144/88  06/10/13 160/90   Poor control.  Plan Add amlodipine  F/u BP check

## 2013-11-06 NOTE — Assessment & Plan Note (Addendum)
Lab Results  Component Value Date   HGBA1C 8.2* 05/06/2013   Last A1C at outside lab was 7.5%  Plan A1C less than threshold for med change  Better diet and exercise with repeat A1C in March-April '15

## 2013-11-06 NOTE — Assessment & Plan Note (Signed)
Body mass index is 50.19 kg/(m^2).  Diet management: smart food choices, PORTION SIZE CONTROL, regular exercise. Goal - to loose 1-2 lbs.month. Target weight - 300 lbs

## 2013-11-06 NOTE — Assessment & Plan Note (Signed)
Taking and tolerating "statin" therapy w/o adverse effects.   Lab reveals good control with LDL better than goal of 100 or less and HDL better than goal of 40+. Liver functions were normal.

## 2013-11-11 ENCOUNTER — Encounter: Payer: Self-pay | Admitting: Internal Medicine

## 2013-12-10 ENCOUNTER — Encounter: Payer: Self-pay | Admitting: Internal Medicine

## 2013-12-10 ENCOUNTER — Ambulatory Visit (INDEPENDENT_AMBULATORY_CARE_PROVIDER_SITE_OTHER): Payer: Medicare PPO | Admitting: Internal Medicine

## 2013-12-10 VITALS — BP 160/100 | HR 91 | Temp 97.7°F | Wt 336.4 lb

## 2013-12-10 DIAGNOSIS — E119 Type 2 diabetes mellitus without complications: Secondary | ICD-10-CM

## 2013-12-10 DIAGNOSIS — I10 Essential (primary) hypertension: Secondary | ICD-10-CM

## 2013-12-10 MED ORDER — OLMESARTAN MEDOXOMIL-HCTZ 40-25 MG PO TABS: 1.0000 | ORAL_TABLET | Freq: Every day | ORAL | Status: DC

## 2013-12-10 NOTE — Assessment & Plan Note (Signed)
Lab Results  Component Value Date   HGBA1C 8.2* 05/06/2013   Plan Return in 1-2 weeks for A1c with recommendations to follow

## 2013-12-10 NOTE — Patient Instructions (Signed)
Thanks for conming to see me.  1. Blood pressure - Poor control on present medications.  Plan Stop lisinopril/Hct  Start Benicar/Hct 40/25 once a day  Return for lab in 7-10 days to be sure there is no kidney insult  2. Over active Bladder - continue the Toviaz  3. Diabetes - last A1C June '14 8.2% - too high Plan Return in 7-10 days for repeat A1C with recommendations to follow.

## 2013-12-10 NOTE — Progress Notes (Signed)
   Subjective:    Patient ID: Holly [name], female    DOB: [birth year], 73 y.o.   MRN: [id number]  HPI Holly [name] presents for f/u blood pressure. On Lisinopril/hct 20/25 BP is 160/100. She is asymptomatic. She does not check BP at home  She is followed by Dr. McDiarmid for OAB and is on Toviaz. She tolerates this well. She has been advised to avoid caffeine and citrus (?).   Last A1C June '14 = 8.2%, last serum glucose 141.  PMH, FamHx and SocHx reviewed for any changes and relevance.  Current Outpatient Prescriptions on File Prior to Visit  Medication Sig Dispense Refill  . amLODipine (NORVASC) 10 MG tablet Take 1 tablet (10 mg total) by mouth daily.  90 tablet  3  . aspirin 81 MG EC tablet Take 81 mg by mouth daily.        . cetirizine (ZYRTEC ALLERGY) 10 MG tablet Take 10 mg by mouth daily.        . Cholecalciferol (VITAMIN D) 400 UNIT capsule Take 400 Units by mouth daily.        Marland Kitchen ibuprofen (ADVIL,MOTRIN) 200 MG tablet Take 400-600 mg by mouth every 6 (six) hours as needed for pain.      Marland Kitchen lisinopril-hydrochlorothiazide (PRINZIDE,ZESTORETIC) 20-25 MG per tablet Take 1 tablet by mouth daily.      . metFORMIN (GLUCOPHAGE) 1000 MG tablet TAKE 1 TABLET TWICE DAILY FOR DIABETES CONTROL  180 tablet  3  . simvastatin (ZOCOR) 40 MG tablet TAKE 1 TABLET EVERY EVENING FOR CHOLESTEROL CONTROL  90 tablet  3   No current facility-administered medications on file prior to visit.        Review of Systems System review is negative for any constitutional, cardiac, pulmonary, GI or neuro symptoms or complaints other than as described in the HPI.     Objective:   Physical Exam Filed Vitals:   12/10/13 1019  BP: 160/100  Pulse: 91  Temp: 97.7 F (36.5 C)   Wt Readings from Last 3 Encounters:  12/10/13 336 lb 6.4 oz (152.59 kg)  11/05/13 340 lb (154.223 kg)  06/24/13 341 lb (154.677 kg)   Gen'l- obese woman in no distress HEENT- C&S clear Cor - RRR Pulm - normal  respirations Neuro - A&O       Assessment & Plan:

## 2013-12-10 NOTE — Assessment & Plan Note (Signed)
Poor control on present medications.  Plan Stop lisinopril/Hct  Start Benicar/Hct 40/25 once a day  Return for lab in 7-10 days to be sure there is no kidney insult

## 2013-12-10 NOTE — Progress Notes (Signed)
Pre visit review using our clinic review tool, if applicable. No additional management support is needed unless otherwise documented below in the visit note. 

## 2013-12-11 NOTE — Assessment & Plan Note (Signed)
Body mass index is 49.65 kg/(m^2).  She is encouraged to work on [employer]. She does know the importance of this problem.

## 2013-12-13 ENCOUNTER — Telehealth: Payer: Self-pay

## 2013-12-13 NOTE — Telephone Encounter (Signed)
Relevant patient education mailed to patient.  

## 2013-12-18 ENCOUNTER — Telehealth: Payer: Self-pay | Admitting: Internal Medicine

## 2013-12-18 NOTE — Telephone Encounter (Signed)
Relevant patient education mailed to patient.  

## 2013-12-23 ENCOUNTER — Ambulatory Visit: Payer: Medicare PPO | Admitting: *Deleted

## 2013-12-23 ENCOUNTER — Other Ambulatory Visit (INDEPENDENT_AMBULATORY_CARE_PROVIDER_SITE_OTHER): Payer: Medicare PPO

## 2013-12-23 VITALS — BP 140/94

## 2013-12-23 DIAGNOSIS — I10 Essential (primary) hypertension: Secondary | ICD-10-CM

## 2013-12-23 DIAGNOSIS — E119 Type 2 diabetes mellitus without complications: Secondary | ICD-10-CM

## 2013-12-23 LAB — BASIC METABOLIC PANEL
BUN: 26 mg/dL — ABNORMAL HIGH (ref 6–23)
CALCIUM: 9.7 mg/dL (ref 8.4–10.5)
CO2: 24 meq/L (ref 19–32)
Chloride: 102 mEq/L (ref 96–112)
Creatinine, Ser: 1.1 mg/dL (ref 0.4–1.2)
GFR: 62.39 mL/min (ref >60.00)
GLUCOSE: 133 mg/dL — AB (ref 70–99)
Potassium: 3.8 mEq/L (ref 3.5–5.1)
SODIUM: 137 meq/L (ref 135–145)

## 2013-12-23 LAB — HEMOGLOBIN A1C: Hgb A1c MFr Bld: 8.1 % — ABNORMAL HIGH (ref 4.6–6.5)

## 2013-12-24 ENCOUNTER — Telehealth: Payer: Self-pay | Admitting: *Deleted

## 2013-12-24 NOTE — Telephone Encounter (Signed)
Please consider a MyChart account. Bmet normal A1C 8.1% too high - needs OV to discuss change in treatment.

## 2013-12-24 NOTE — Telephone Encounter (Signed)
Patient phoned requesting lab results from 12/23/13.  Please advise.  CB# [phone]

## 2013-12-25 NOTE — Telephone Encounter (Signed)
Notified patient of MD response and recommendations-she doesn't have a computer. Transferred to scheduling for an OV

## 2013-12-26 ENCOUNTER — Ambulatory Visit: Payer: Medicare PPO | Admitting: Internal Medicine

## 2013-12-26 ENCOUNTER — Ambulatory Visit (INDEPENDENT_AMBULATORY_CARE_PROVIDER_SITE_OTHER): Payer: Medicare PPO | Admitting: Internal Medicine

## 2013-12-26 ENCOUNTER — Encounter: Payer: Self-pay | Admitting: Internal Medicine

## 2013-12-26 VITALS — BP 130/82 | HR 84 | Temp 97.7°F | Wt 335.0 lb

## 2013-12-26 DIAGNOSIS — E119 Type 2 diabetes mellitus without complications: Secondary | ICD-10-CM

## 2013-12-26 MED ORDER — LINAGLIPTIN 5 MG PO TABS: 5.0000 mg | ORAL_TABLET | Freq: Every day | ORAL | Status: DC

## 2013-12-26 NOTE — Patient Instructions (Signed)
Your last A1C was 8.1% and we need to make a change  Plan Continue Metformin 1,000mg  twice a day  Start Tradjenta 5 mg once a day - #14 tablets as samples. This is on the [employer] formulary as a tier 3 product.   If you tolerate the tradjenta well - we can prescribe a combination product of tradjenta/metformin in the same pill. Call in 7 to 10 days to report tolerability  Continue to follow a sugar free low carb diet and continue to exercise.  Next A1C in 3 months

## 2013-12-26 NOTE — Progress Notes (Signed)
Pre visit review using our clinic review tool, if applicable. No additional management support is needed unless otherwise documented below in the visit note. 

## 2013-12-27 ENCOUNTER — Telehealth: Payer: Self-pay | Admitting: *Deleted

## 2013-12-27 MED ORDER — RELION ULTIMA GLUCOSE SYSTEM W/DEVICE KIT: 1.0000 | PACK | Freq: Two times a day (BID) | Status: DC

## 2013-12-27 MED ORDER — GLUCOSE BLOOD VI STRP: ORAL_STRIP | Status: DC

## 2013-12-27 NOTE — Telephone Encounter (Signed)
Patient phoned office, at PCP request, to notify us of her blood glucose monitor RELION ULTIMA glucose meter so that it could be added to her MAR and test strip refills submitted to [facility].  Added to [facility] and test strips sent to pharmacy.

## 2013-12-28 NOTE — Assessment & Plan Note (Signed)
Lab Results  Component Value Date   HGBA1C 8.1* 12/23/2013   Plan Add DPP4 to her regimen  Continue diet, try to add exercise  Follow up A1C in 3 months

## 2013-12-28 NOTE — Progress Notes (Signed)
   Subjective:    Patient ID: Holly [name], female    DOB: [birth year], 73 y.o.   MRN: [id number]  HPI Holly [name] presents for adjustment in medication for diabetes control with last A1C of 8.1%. She does continue to work on good diet. Exercise is limited due to weight and knee pain.  PMH, FamHx and SocHx reviewed for any changes and relevance.  Current Outpatient Prescriptions on File Prior to Visit  Medication Sig Dispense Refill  . amLODipine (NORVASC) 10 MG tablet Take 1 tablet (10 mg total) by mouth daily.  90 tablet  3  . aspirin 81 MG EC tablet Take 81 mg by mouth daily.        . cetirizine (ZYRTEC ALLERGY) 10 MG tablet Take 10 mg by mouth daily.        . Cholecalciferol (VITAMIN D) 400 UNIT capsule Take 400 Units by mouth daily.        . fesoterodine (TOVIAZ) 8 MG TB24 tablet Take 8 mg by mouth daily.      Marland Kitchen ibuprofen (ADVIL,MOTRIN) 200 MG tablet Take 400-600 mg by mouth every 6 (six) hours as needed for pain.      . metFORMIN (GLUCOPHAGE) 1000 MG tablet TAKE 1 TABLET TWICE DAILY FOR DIABETES CONTROL  180 tablet  3  . olmesartan-hydrochlorothiazide (BENICAR HCT) 40-25 MG per tablet Take 1 tablet by mouth daily.      . simvastatin (ZOCOR) 40 MG tablet TAKE 1 TABLET EVERY EVENING FOR CHOLESTEROL CONTROL  90 tablet  3   No current facility-administered medications on file prior to visit.      Review of Systems System review is negative for any constitutional, cardiac, pulmonary, GI or neuro symptoms or complaints other than as described in the HPI.     Objective:   Physical Exam Filed Vitals:   12/26/13 1615  BP: 130/82  Pulse: 84  Temp: 97.7 F (36.5 C)   Wt Readings from Last 3 Encounters:  12/26/13 335 lb (151.955 kg)  12/10/13 336 lb 6.4 oz (152.59 kg)  11/05/13 340 lb (154.223 kg)   Gen'l  Obese woman in no acute distress         Assessment & Plan:  (greater than 50% of 20 min visit spent on education and counseling)

## 2014-01-02 ENCOUNTER — Other Ambulatory Visit: Payer: Self-pay

## 2014-01-02 MED ORDER — ACCU-CHEK FASTCLIX LANCETS MISC: Status: DC

## 2014-01-02 MED ORDER — ACCU-CHEK NANO SMARTVIEW W/DEVICE KIT: PACK | Status: DC

## 2014-01-02 MED ORDER — GLUCOSE BLOOD VI STRP: ORAL_STRIP | Status: DC

## 2014-01-06 ENCOUNTER — Telehealth: Payer: Self-pay | Admitting: *Deleted

## 2014-01-06 NOTE — Telephone Encounter (Signed)
Rx may be sent in for Benicar/hct and tradjenta per med list.  Routine lab in 3 months.

## 2014-01-06 NOTE — Telephone Encounter (Signed)
Patient phoned inquiring about recent "add on" bp & diabetic meds that she was provided samples for in last last OV with PCP to see if they would be effective without causing any side effect.  Patient said no side f/x and her bp & bg's were declining.  Samples were provided and available for p/u  If PCP has any further instructions for patient--regarding f/u labs, etc., please advise

## 2014-01-22 LAB — HM DIABETES EYE EXAM

## 2014-02-06 ENCOUNTER — Ambulatory Visit (INDEPENDENT_AMBULATORY_CARE_PROVIDER_SITE_OTHER): Payer: Medicare PPO | Admitting: Internal Medicine

## 2014-02-06 ENCOUNTER — Encounter: Payer: Self-pay | Admitting: Internal Medicine

## 2014-02-06 VITALS — BP 150/88 | HR 87 | Temp 98.0°F | Wt 335.0 lb

## 2014-02-06 DIAGNOSIS — I10 Essential (primary) hypertension: Secondary | ICD-10-CM

## 2014-02-06 DIAGNOSIS — E119 Type 2 diabetes mellitus without complications: Secondary | ICD-10-CM

## 2014-02-06 MED ORDER — LINAGLIPTIN 5 MG PO TABS: 5.0000 mg | ORAL_TABLET | Freq: Every day | ORAL | Status: DC

## 2014-02-06 MED ORDER — OLMESARTAN MEDOXOMIL-HCTZ 40-25 MG PO TABS: 1.0000 | ORAL_TABLET | Freq: Every day | ORAL | Status: DC

## 2014-02-06 NOTE — Progress Notes (Signed)
Pre visit review using our clinic review tool, if applicable. No additional management support is needed unless otherwise documented below in the visit note. 

## 2014-02-06 NOTE — Progress Notes (Signed)
   Subjective:    Patient ID: Holly [name], female    DOB: 10/22/1941, 72 y.o.   MRN: [id number]  HPI Holly [name] presents for follow up - she was started Tradjenta along with metformin - she reports better blood sugar control. She was also changed to Benicar/hct and her BPs have been better.  She needs more samples and Rx sent to pharmacy.   PMH, FamHx and SocHx reviewed for any changes and relevance.  Current Outpatient Prescriptions on File Prior to Visit  Medication Sig Dispense Refill  . ACCU-CHEK FASTCLIX LANCETS MISC Use as directed to test blood sugars Dx: 250.00  102 each  12  . amLODipine (NORVASC) 10 MG tablet Take 1 tablet (10 mg total) by mouth daily.  90 tablet  3  . aspirin 81 MG EC tablet Take 81 mg by mouth daily.        . Blood Glucose Monitoring Suppl (ACCU-CHEK NANO SMARTVIEW) W/DEVICE KIT Use as directed to test blood sugars Dx: 250.00  1 kit  0  . Blood Glucose Monitoring Suppl (RELION ULTIMA GLUCOSE SYSTEM) W/DEVICE KIT 1 each by Does not apply route 2 (two) times daily.  1 kit  0  . cetirizine (ZYRTEC ALLERGY) 10 MG tablet Take 10 mg by mouth daily.        . Cholecalciferol (VITAMIN D) 400 UNIT capsule Take 400 Units by mouth daily.        . fesoterodine (TOVIAZ) 8 MG TB24 tablet Take 8 mg by mouth daily.      . glucose blood (ACCU-CHEK SMARTVIEW) test strip Use as instructed to test blood sugars Dx: 250.00  100 each  12  . glucose blood (RELION ULTIMA TEST) test strip Use as instructed.  Check twice daily and as needed.  100 each  12  . ibuprofen (ADVIL,MOTRIN) 200 MG tablet Take 400-600 mg by mouth every 6 (six) hours as needed for pain.      . metFORMIN (GLUCOPHAGE) 1000 MG tablet TAKE 1 TABLET TWICE DAILY FOR DIABETES CONTROL  180 tablet  3  . simvastatin (ZOCOR) 40 MG tablet TAKE 1 TABLET EVERY EVENING FOR CHOLESTEROL CONTROL  90 tablet  3   No current facility-administered medications on file prior to visit.     Review of Systems System review is  negative for any constitutional, cardiac, pulmonary, GI or neuro symptoms or complaints other than as described in the HPI.     Objective:   Physical Exam Filed Vitals:   02/06/14 0821  BP: 150/88  Pulse: 87  Temp: 98 F (36.7 C)   BP Readings from Last 3 Encounters:  02/06/14 150/88  12/26/13 130/82  12/23/13 140/94   Wt Readings from Last 3 Encounters:  02/06/14 335 lb (151.955 kg)  12/26/13 335 lb (151.955 kg)  12/10/13 336 lb 6.4 oz (152.59 kg)   Body mass index is 49.45 kg/(m^2). Gen'l - obese woman in no distress Cor - RRR Pul  Normal respirations Neuro - awaken and oriented x 3.        Assessment & Plan:   

## 2014-02-06 NOTE — Assessment & Plan Note (Signed)
Doing well on Benicar/Hct with home readings in the 130's/80's  Plan - Rx for Benicar/Hct 40/25

## 2014-02-06 NOTE — Assessment & Plan Note (Signed)
Lab Results  Component Value Date   HGBA1C 8.1* 12/23/2013   Tolerating tradjenta. Home CBGs in the 130's  Plan Continue present regimen.  F/u lab May 14th with OV to follow.

## 2014-02-06 NOTE — Patient Instructions (Signed)
So happy that the new medications are working well for you.  Come back for Lab work May 14th or after. F/u office visit end of May with one of our other providers.  New doctor: Vertell Novak, MD - she starts work in September.

## 2014-02-25 ENCOUNTER — Telehealth: Payer: Self-pay | Admitting: Internal Medicine

## 2014-02-25 DIAGNOSIS — E119 Type 2 diabetes mellitus without complications: Secondary | ICD-10-CM

## 2014-02-25 DIAGNOSIS — I10 Essential (primary) hypertension: Secondary | ICD-10-CM

## 2014-02-25 NOTE — Telephone Encounter (Signed)
Patient states that [facility] sent a fax requesting that a generic for her Linagliptin (TRADJENTA) 5 MG TABS tablet and olmesartan-hydrochlorothiazide (BENICAR HCT) 40-25 MG medications be sent to them. Patient says that the rx's that were sent will cost her $200 which she cannot afford. Patient was switched to Dr. Ronnald Ramp and has an appointment in May. Please advise.

## 2014-02-26 MED ORDER — LOSARTAN POTASSIUM-HCTZ 100-25 MG PO TABS: 1.0000 | ORAL_TABLET | Freq: Every day | ORAL | Status: DC

## 2014-02-26 NOTE — Telephone Encounter (Signed)
There is no generic for tradjenta so just stop that BenicarHCT was changed to a generic

## 2014-02-26 NOTE — Telephone Encounter (Signed)
Pt.notified

## 2014-03-14 ENCOUNTER — Telehealth: Payer: Self-pay | Admitting: Internal Medicine

## 2014-03-14 NOTE — Telephone Encounter (Signed)
[facility] is requesting a rx change from [place] Tab to one of the following: Pioglitazone Tab, Glipizide Tab, or Glipizide ER Tab. The reason for the request is the $ savings to the pt. If one of the alternative are approved please provide the strenght and dosage.  Please advise. Thanks!

## 2014-03-14 NOTE — Telephone Encounter (Signed)
Spoke with Clarene Critchley at [facility] advised of MDs message

## 2014-03-14 NOTE — Telephone Encounter (Signed)
   Her diabetes is poorly controlled at 8.1%; these medications would be relatively contraindicated because of her advanced age and high risk of potential adverse reaction (hypoglycemia or CHF).  If they're not willing to continue the present prescription; I recommend  consult with [facility]

## 2014-04-08 ENCOUNTER — Other Ambulatory Visit (INDEPENDENT_AMBULATORY_CARE_PROVIDER_SITE_OTHER): Payer: Medicare PPO

## 2014-04-08 ENCOUNTER — Encounter: Payer: Self-pay | Admitting: Internal Medicine

## 2014-04-08 ENCOUNTER — Ambulatory Visit (INDEPENDENT_AMBULATORY_CARE_PROVIDER_SITE_OTHER): Payer: Medicare PPO | Admitting: Internal Medicine

## 2014-04-08 VITALS — BP 124/82 | HR 85 | Temp 98.1°F | Resp 16 | Ht 69.0 in | Wt 332.5 lb

## 2014-04-08 DIAGNOSIS — Z23 Encounter for immunization: Secondary | ICD-10-CM

## 2014-04-08 DIAGNOSIS — E119 Type 2 diabetes mellitus without complications: Secondary | ICD-10-CM

## 2014-04-08 DIAGNOSIS — I10 Essential (primary) hypertension: Secondary | ICD-10-CM

## 2014-04-08 DIAGNOSIS — Z0000 Encounter for general adult medical examination without abnormal findings: Secondary | ICD-10-CM

## 2014-04-08 LAB — BASIC METABOLIC PANEL
BUN: 21 mg/dL (ref 6–23)
CALCIUM: 9.6 mg/dL (ref 8.4–10.5)
CO2: 28 mEq/L (ref 19–32)
Chloride: 100 mEq/L (ref 96–112)
Creatinine, Ser: 1 mg/dL (ref 0.4–1.2)
GFR: 73.7 mL/min (ref >60.00)
Glucose, Bld: 148 mg/dL — ABNORMAL HIGH (ref 70–99)
Potassium: 3.6 mEq/L (ref 3.5–5.1)
Sodium: 137 mEq/L (ref 135–145)

## 2014-04-08 LAB — HEMOGLOBIN A1C: Hgb A1c MFr Bld: 6.8 % — ABNORMAL HIGH (ref 4.6–6.5)

## 2014-04-08 LAB — HM DIABETES FOOT EXAM

## 2014-04-08 MED ORDER — PHENTERMINE HCL 37.5 MG PO TABS: 37.5000 mg | ORAL_TABLET | Freq: Every day | ORAL | Status: DC

## 2014-04-08 NOTE — Progress Notes (Signed)
Subjective:    Patient ID: Holly [name], female    DOB: 12/28/1940, 73 y.o.   MRN: [id number]  Hypertension This is a chronic problem. The current episode started more than 1 year ago. The problem has been gradually improving since onset. The problem is controlled. Associated symptoms include peripheral edema (ankle edema). Pertinent negatives include no anxiety, blurred vision, chest pain, headaches, malaise/fatigue, neck pain, orthopnea, palpitations, PND, shortness of breath or sweats. Agents associated with hypertension include NSAIDs. Past treatments include angiotensin blockers, diuretics and calcium channel blockers. The current treatment provides significant improvement. Compliance problems include diet, exercise and medication side effects (she has noticed ankle edema since starting the CCB).       Review of Systems  Constitutional: Negative.  Negative for fever, chills, malaise/fatigue, diaphoresis, appetite change and fatigue.  HENT: Negative.   Eyes: Negative.  Negative for blurred vision.  Respiratory: Negative.  Negative for cough, choking, chest tightness, shortness of breath and stridor.   Cardiovascular: Negative.  Negative for chest pain, palpitations, orthopnea, leg swelling and PND.  Gastrointestinal: Negative.  Negative for nausea, vomiting, abdominal pain, diarrhea, constipation and blood in stool.  Endocrine: Negative.  Negative for polydipsia, polyphagia and polyuria.  Genitourinary: Negative.   Musculoskeletal: Negative.  Negative for arthralgias, back pain, myalgias and neck pain.  Skin: Negative.   Allergic/Immunologic: Negative.   Neurological: Negative for dizziness, tremors, speech difficulty, weakness, light-headedness, numbness and headaches.  Hematological: Negative.  Negative for adenopathy. Does not bruise/bleed easily.  Psychiatric/Behavioral: Negative.        Objective:   Physical Exam  Vitals reviewed. Constitutional: She is oriented to  person, place, and time. She appears well-developed and well-nourished. No distress.  HENT:  Head: Normocephalic and atraumatic.  Mouth/Throat: Oropharynx is clear and moist. No oropharyngeal exudate.  Eyes: Conjunctivae are normal. Right eye exhibits no discharge. Left eye exhibits no discharge. No scleral icterus.  Neck: Normal range of motion. Neck supple. No JVD present. No tracheal deviation present. No thyromegaly present.  Cardiovascular: Normal rate, regular rhythm, normal heart sounds and intact distal pulses.  Exam reveals no gallop and no friction rub.   No murmur heard. Pulmonary/Chest: Effort normal and breath sounds normal. No stridor. No respiratory distress. She has no wheezes. She has no rales. She exhibits no tenderness.  Abdominal: Soft. Bowel sounds are normal. She exhibits no distension and no mass. There is no tenderness. There is no rebound and no guarding.  Musculoskeletal: Normal range of motion. She exhibits no edema and no tenderness.  Lymphadenopathy:    She has no cervical adenopathy.  Neurological: She is oriented to person, place, and time.  Skin: Skin is warm and dry. No rash noted. She is not diaphoretic. No erythema. No pallor.  Psychiatric: She has a normal mood and affect. Her behavior is normal. Judgment and thought content normal.     Lab Results  Component Value Date   WBC 6.2 02/12/2010   HGB 14.1 02/12/2010   HCT 41.3 02/12/2010   PLT 237.0 02/12/2010   GLUCOSE 133* 12/23/2013   CHOL 188 11/05/2013   TRIG 169.0* 11/05/2013   HDL 59.70 11/05/2013   LDLDIRECT 134.8 08/27/2010   LDLCALC 95 11/05/2013   ALT 19 11/05/2013   AST 17 11/05/2013   NA 137 12/23/2013   K 3.8 12/23/2013   CL 102 12/23/2013   CREATININE 1.1 12/23/2013   BUN 26* 12/23/2013   CO2 24 12/23/2013   TSH 3.41 05/06/2013   HGBA1C 8.1*  12/23/2013       Assessment & Plan:

## 2014-04-08 NOTE — Progress Notes (Signed)
Pre visit review using our clinic review tool, if applicable. No additional management support is needed unless otherwise documented below in the visit note. 

## 2014-04-08 NOTE — Assessment & Plan Note (Signed)
She will try phentermine

## 2014-04-08 NOTE — Patient Instructions (Signed)

## 2014-04-09 MED ORDER — LINAGLIPTIN 5 MG PO TABS: 5.0000 mg | ORAL_TABLET | Freq: Every day | ORAL | Status: DC

## 2014-04-09 NOTE — Assessment & Plan Note (Signed)
Her blood sugars are well controlled 

## 2014-04-09 NOTE — Assessment & Plan Note (Signed)

## 2014-04-09 NOTE — Assessment & Plan Note (Signed)
Her BP is well controlled but the addition of the CCB has caused ankle edema so will stop it Her lytes and renal function are stable

## 2014-06-10 ENCOUNTER — Encounter: Payer: Self-pay | Admitting: Internal Medicine

## 2014-06-10 ENCOUNTER — Ambulatory Visit (INDEPENDENT_AMBULATORY_CARE_PROVIDER_SITE_OTHER): Payer: Medicare PPO | Admitting: Internal Medicine

## 2014-06-10 VITALS — BP 134/86 | HR 88 | Temp 97.8°F | Resp 16 | Ht 69.0 in | Wt 326.0 lb

## 2014-06-10 DIAGNOSIS — N904 Leukoplakia of vulva: Secondary | ICD-10-CM

## 2014-06-10 DIAGNOSIS — N760 Acute vaginitis: Secondary | ICD-10-CM | POA: Insufficient documentation

## 2014-06-10 DIAGNOSIS — L940 Localized scleroderma [morphea]: Secondary | ICD-10-CM

## 2014-06-10 MED ORDER — FLUOCINONIDE-E 0.05 % EX CREA: 1.0000 "application " | TOPICAL_CREAM | Freq: Two times a day (BID) | CUTANEOUS | Status: DC

## 2014-06-10 NOTE — Assessment & Plan Note (Signed)
Will treat with lidex cream May consider a biopsy if not better in a few weeks

## 2014-06-10 NOTE — Progress Notes (Signed)
Pre visit review using our clinic review tool, if applicable. No additional management support is needed unless otherwise documented below in the visit note. 

## 2014-06-10 NOTE — Progress Notes (Signed)
   Subjective:    Patient ID: Holly [name], female    DOB: 10-10-1941, 73 y.o.   MRN: [id number]  HPI Comments: She complains of vaginal itching today. This has been present for several weeks, she has applied vaseline with no relief. She wants to start premarin cream.  Rash Pertinent negatives include no cough, diarrhea, fatigue, fever or shortness of breath.      Review of Systems  Constitutional: Negative.  Negative for fever, chills, diaphoresis, appetite change and fatigue.  HENT: Negative.   Eyes: Negative.   Respiratory: Negative.  Negative for cough, choking, chest tightness, shortness of breath and stridor.   Cardiovascular: Negative.  Negative for chest pain, palpitations and leg swelling.  Gastrointestinal: Negative.  Negative for nausea, abdominal pain, diarrhea, constipation and blood in stool.  Endocrine: Negative.  Negative for polydipsia, polyphagia and polyuria.  Genitourinary: Negative.  Negative for dysuria, urgency, frequency, hematuria, flank pain, decreased urine volume, vaginal bleeding, vaginal discharge, enuresis, difficulty urinating, genital sores, vaginal pain, menstrual problem, pelvic pain and dyspareunia.  Musculoskeletal: Negative.   Skin: Positive for rash.  Allergic/Immunologic: Negative.   Neurological: Negative.   Hematological: Negative.  Negative for adenopathy. Does not bruise/bleed easily.  Psychiatric/Behavioral: Negative.        Objective:   Physical Exam  Vitals reviewed. Constitutional: She is oriented to person, place, and time. She appears well-developed and well-nourished. No distress.  HENT:  Head: Normocephalic and atraumatic.  Mouth/Throat: Oropharynx is clear and moist. No oropharyngeal exudate.  Eyes: Conjunctivae are normal. Right eye exhibits no discharge. Left eye exhibits no discharge. No scleral icterus.  Neck: Normal range of motion. Neck supple. No JVD present. No tracheal deviation present. No thyromegaly present.    Cardiovascular: Normal rate, regular rhythm, normal heart sounds and intact distal pulses.  Exam reveals no gallop and no friction rub.   No murmur heard. Pulmonary/Chest: Effort normal and breath sounds normal. No stridor. No respiratory distress. She has no wheezes. She has no rales. She exhibits no tenderness.  Abdominal: Soft. Bowel sounds are normal. She exhibits no distension and no mass. There is no tenderness. There is no rebound and no guarding. Hernia confirmed negative in the right inguinal area and confirmed negative in the left inguinal area.  Genitourinary:    No labial fusion. There is rash on the right labia. There is no tenderness, lesion or injury on the right labia. There is rash and lesion on the left labia. There is no tenderness or injury on the left labia. No erythema, tenderness or bleeding around the vagina. No foreign body around the vagina. No signs of injury around the vagina. No vaginal discharge found.  Musculoskeletal: Normal range of motion. She exhibits no edema and no tenderness.  Lymphadenopathy:    She has no cervical adenopathy.       Right: No inguinal adenopathy present.       Left: No inguinal adenopathy present.  Neurological: She is oriented to person, place, and time.  Skin: Skin is warm and dry. No rash noted. She is not diaphoretic. No erythema. No pallor.  Psychiatric: She has a normal mood and affect. Her behavior is normal. Judgment and thought content normal.          Assessment & Plan:

## 2014-06-10 NOTE — Assessment & Plan Note (Signed)
Will check a PAP to screen for malignancy and wet prep to look for infection

## 2014-06-10 NOTE — Patient Instructions (Signed)
Lichen Sclerosus Lichen sclerosus is a skin problem. It can happen on any part of the body, but it commonly involves the anal or genital areas. Lichen sclerosus is not an infection or a fungus. Girls and women are more commonly affected than boys and men. CAUSES The cause is not known. It could be the result of an overactive immune system or a lack of certain hormones. Lichen sclerosus is not passed from one person to another (not contagious). SYMPTOMS Your skin may have:  Thin, wrinkled, white areas.  Thickened white areas.  Red and swollen patches.  Tears or cracks.  Bruising.  Blood blisters.  Severe itching. You may also have pain, itching, or burning with urination. Constipation is also common in people with lichen sclerosus. DIAGNOSIS Your caregiver will do a physical exam. Sometimes, a tissue sample (biopsy) may be sent for testing. TREATMENT Treatment may involve putting a thin layer of medicated cream (topical steroid) over the areas with lichen sclerosus. Use the cream only as directed by your caregiver.  HOME CARE INSTRUCTIONS  Only take over-the-counter or prescription medicines as directed by your caregiver.  Keep the vaginal area as clean and dry as possible. SEEK MEDICAL CARE IF: You develop increasing pain, swelling, or redness. Document Released: 03/23/2011 Document Revised: 01/23/2012 Document Reviewed: 03/23/2011 [employer] Patient Information 2015 [employer]. This information is not intended to replace advice given to you by your health care provider. Make sure you discuss any questions you have with your health care provider.  

## 2014-06-11 ENCOUNTER — Ambulatory Visit (INDEPENDENT_AMBULATORY_CARE_PROVIDER_SITE_OTHER): Payer: Medicare PPO | Admitting: Family Medicine

## 2014-06-11 ENCOUNTER — Encounter: Payer: Self-pay | Admitting: Family Medicine

## 2014-06-11 ENCOUNTER — Other Ambulatory Visit (INDEPENDENT_AMBULATORY_CARE_PROVIDER_SITE_OTHER): Payer: Medicare PPO

## 2014-06-11 VITALS — BP 134/86 | HR 79 | Ht 69.5 in | Wt 326.0 lb

## 2014-06-11 DIAGNOSIS — M1711 Unilateral primary osteoarthritis, right knee: Secondary | ICD-10-CM

## 2014-06-11 DIAGNOSIS — M25561 Pain in right knee: Secondary | ICD-10-CM

## 2014-06-11 DIAGNOSIS — M25569 Pain in unspecified knee: Secondary | ICD-10-CM

## 2014-06-11 DIAGNOSIS — M1710 Unilateral primary osteoarthritis, unspecified knee: Secondary | ICD-10-CM

## 2014-06-11 NOTE — Progress Notes (Signed)
CC: Right knee pain  HPI: Patient is a very pleasant 73 year old female coming in with right right knee pain. Patient was seen previously approximately one year ago. Patient had strain at that time. Patient states that now it seems to be more knee. Patient was going up significant amount of stairs this weekend and noticed more pain. Patient states most of the pain is on the anterior medial and posterior aspect of the right knee. States that there may be some swelling. Mild radiation into her calf. Has been wearing the calf compression sleeve with minimal benefit. Has not tried any other home modalities at this time. Patient rates the severity of 8/10. No nighttime awakening. Find it difficult to ambulate regularly secondary to pain.  Past medical, surgical, family and social history reviewed. Medications reviewed all in the electronic medical record.   Review of Systems: No headache, visual changes, nausea, vomiting, diarrhea, constipation, dizziness, abdominal pain, skin rash, fevers, chills, night sweats, weight loss, swollen lymph nodes, body aches, joint swelling, muscle aches, chest pain, shortness of breath, mood changes.   Objective:    Blood pressure 134/86, pulse 79, height 5' 9.5" (1.765 m), weight 326 lb (147.873 kg), SpO2 96.00%.   General: No apparent distress alert and oriented x3 mood and affect normal, dressed appropriately.  HEENT: Pupils equal, extraocular movements intact Respiratory: Patient's speak in full sentences and does not appear short of breath Cardiovascular: No lower extremity edema, non tender, no erythema Skin: Warm dry intact with no signs of infection or rash on extremities or on axial skeleton. Abdomen: Soft nontender Neuro: Cranial nerves II through XII are intact, neurovascularly intact in all extremities with 2+ DTRs and 2+ pulses. Lymph: No lymphadenopathy of posterior or anterior cervical chain or axillae bilaterally.  Gait this patient with an antalgic  gait walking with a cane keeping the foot in dorsiflexion on the right side. MSK: Non tender with full range of motion and good stability and symmetric strength and tone of shoulders, elbows, wrist and ankles bilaterally.  Knee: Right Normal to inspection with no erythema or effusion or obvious bony abnormalities. Palpation normal with no warmth, joint line tenderness, patellar tenderness, or condyle tenderness. ROM full in flexion and extension and lower leg rotation. Ligaments with solid consistent endpoints including ACL, PCL, LCL, MCL. Negative Mcmurray's, Apley's, and Thessalonian tests. Non painful patellar compression. Patellar glide without crepitus. Patellar and quadriceps tendons unremarkable. Hamstring and quadriceps strength is normal.    MSK US performed of: right knee.  This study was ordered, performed, and interpreted by Charlann Boxer D.O.  Knee: All structures visualized. Anteromedial, anterolateral, posteromedial, and posterolateral menisci unremarkable without tearing, fraying, effusion, or displacement. Moderate narrowing of medial joint line.  Patellar Tendon unremarkable on long and transverse views without effusion. No abnormality of prepatellar bursa. LCL and MCL unremarkable on long and transverse views. No abnormality of origin of medial or lateral head of the gastrocnemius.  IMPRESSION:  Moderate osteoarthritic change  Procedure: Real-time Ultrasound Guided Injection of right knee Device: GE Logiq E  Ultrasound guided injection is preferred based studies that show increased duration, increased effect, greater accuracy, decreased procedural pain, increased response rate, and decreased cost with ultrasound guided versus blind injection.  Verbal informed consent obtained.  Time-out conducted.  Noted no overlying erythema, induration, or other signs of local infection.  Skin prepped in a sterile fashion.  Local anesthesia: Topical Ethyl chloride.  With sterile  technique and under real time ultrasound guidance: With a 22-gauge 2  inch needle patient was injected with 4 cc of 0.5% Marcaine and 1 cc of Kenalog 40 mg/dL. This was from a superior lateral approach.  Completed without difficulty  Pain immediately resolved suggesting accurate placement of the medication.  Advised to call if fevers/chills, erythema, induration, drainage, or persistent bleeding.  Images permanently stored and available for review in the ultrasound unit.  Impression: Technically successful ultrasound guided injection.    Impression and Recommendations:     This case required medical decision making of moderate complexity.

## 2014-06-11 NOTE — Assessment & Plan Note (Signed)
Patient was given injection today. Previous x-rays were reviewed by me and showed the patient did have moderate osteophytic changes at that time. Patient was given a medial unloader brace was fitted by me today as well. Patient was given home exercise program and icing protocol. Patient will come back and see me again in 3-4 weeks for further evaluation and treatment. Patient continues to have trouble we need to consider formal physical therapy versus potential viscous supplementation.  Spent greater than 25 minutes with patient face-to-face and had greater than 50% of counseling including as described above in assessment and plan.

## 2014-06-11 NOTE — Patient Instructions (Signed)
Good to see you Vitamin D 2000 IU daily can help delay the arthritis.  Ice 20 minutes 2 times daily. Usually after activity and before bed. Tylenol can help 650mg  three times a day Wear brace with activity Gave you an injection today Wear good shoes Come back in 3-4 weeks to check in.

## 2014-07-07 ENCOUNTER — Ambulatory Visit (INDEPENDENT_AMBULATORY_CARE_PROVIDER_SITE_OTHER): Payer: Medicare PPO | Admitting: Family Medicine

## 2014-07-07 ENCOUNTER — Encounter: Payer: Self-pay | Admitting: Family Medicine

## 2014-07-07 VITALS — BP 152/94 | HR 78 | Ht 69.5 in | Wt 322.0 lb

## 2014-07-07 DIAGNOSIS — M1711 Unilateral primary osteoarthritis, right knee: Secondary | ICD-10-CM

## 2014-07-07 DIAGNOSIS — M1710 Unilateral primary osteoarthritis, unspecified knee: Secondary | ICD-10-CM

## 2014-07-07 MED ORDER — DICLOFENAC SODIUM 2 % TD SOLN: TRANSDERMAL | Status: DC

## 2014-07-07 NOTE — Assessment & Plan Note (Signed)
Discussed with patient at great length. We discussed about possibly doing another round of formal physical therapy which she declined. Patient has failed all other conservative therapy so she would warrant viscous supplementation. We did start series today with first injection. Postinjection instructions given. We discussed an icing regimen at home exercise program continuing. Patient will continue the brace but will start wearing a compression sleeve underneath. Patient will try these interventions and we'll see patient back in one week for second injection in the series of 3.

## 2014-07-07 NOTE — Progress Notes (Signed)
  CC: Right knee pain  HPI: Patient is a very pleasant 73 year old female coming in with right right knee pain. Patient was seen one month ago. Patient at corticosteroid injection into the right knee for her underlying knee arthritis. Patient was also given a brace, home exercise program, and icing protocol. Patient states no improvement. Patient is also failed physical therapy previously. Patient continues to wear the brace on a regular basis. Patient states that she is only approximately 5% better after steroid injection.  Past medical, surgical, family and social history reviewed. Medications reviewed all in the electronic medical record.   Review of Systems: No headache, visual changes, nausea, vomiting, diarrhea, constipation, dizziness, abdominal pain, skin rash, fevers, chills, night sweats, weight loss, swollen lymph nodes, body aches, joint swelling, muscle aches, chest pain, shortness of breath, mood changes.   Objective:    Blood pressure 152/94, pulse 78, height 5' 9.5" (1.765 m), weight 322 lb (146.058 kg), SpO2 97.00%.   General: No apparent distress alert and oriented x3 mood and affect normal, dressed appropriately.  HEENT: Pupils equal, extraocular movements intact Respiratory: Patient's speak in full sentences and does not appear short of breath Cardiovascular: No lower extremity edema, non tender, no erythema Skin: Warm dry intact with no signs of infection or rash on extremities or on axial skeleton. Abdomen: Soft nontender Neuro: Cranial nerves II through XII are intact, neurovascularly intact in all extremities with 2+ DTRs and 2+ pulses. Lymph: No lymphadenopathy of posterior or anterior cervical chain or axillae bilaterally.  Gait this patient with an antalgic gait walking with a cane keeping the foot in dorsiflexion on the right side. MSK: Non tender with full range of motion and good stability and symmetric strength and tone of shoulders, elbows, wrist and ankles  bilaterally.  Knee: Right Normal to inspection with no erythema or effusion or obvious bony abnormalities. Palpation normal with no warmth, joint line tenderness, patellar tenderness, or condyle tenderness. ROM full in flexion and extension and lower leg rotation. Ligaments with solid consistent endpoints including ACL, PCL, LCL, MCL. Negative Mcmurray's, Apley's, and Thessalonian tests. Non painful patellar compression. Patellar glide without crepitus. Patellar and quadriceps tendons unremarkable. Hamstring and quadriceps strength is normal.   After informed written and verbal consent, patient was seated on exam table. Right knee was prepped with alcohol swab and utilizing anterolateral approach, patient's right knee space was injected with16 mg/2.5 mL of Synvisc (sodium hyaluronate) in a prefilled syringe was injected easily into the knee through a 22-gauge needle.  Impression and Recommendations:     This case required medical decision making of moderate complexity.

## 2014-07-07 NOTE — Patient Instructions (Signed)
It is good to see you.  Ice is your friend Continue with the bracing and the exercises.  We started synvisc.  Conitnue the brace but get a sleeve of some kind.  See you next week.

## 2014-07-11 ENCOUNTER — Ambulatory Visit (INDEPENDENT_AMBULATORY_CARE_PROVIDER_SITE_OTHER): Payer: Medicare PPO | Admitting: Internal Medicine

## 2014-07-11 ENCOUNTER — Other Ambulatory Visit (INDEPENDENT_AMBULATORY_CARE_PROVIDER_SITE_OTHER): Payer: Medicare PPO

## 2014-07-11 ENCOUNTER — Encounter: Payer: Self-pay | Admitting: Internal Medicine

## 2014-07-11 VITALS — BP 136/88 | HR 91 | Temp 98.1°F | Resp 16 | Ht 69.5 in | Wt 322.0 lb

## 2014-07-11 DIAGNOSIS — I10 Essential (primary) hypertension: Secondary | ICD-10-CM

## 2014-07-11 DIAGNOSIS — IMO0001 Reserved for inherently not codable concepts without codable children: Secondary | ICD-10-CM

## 2014-07-11 DIAGNOSIS — N904 Leukoplakia of vulva: Secondary | ICD-10-CM

## 2014-07-11 DIAGNOSIS — E1165 Type 2 diabetes mellitus with hyperglycemia: Secondary | ICD-10-CM

## 2014-07-11 DIAGNOSIS — L940 Localized scleroderma [morphea]: Secondary | ICD-10-CM

## 2014-07-11 DIAGNOSIS — Z23 Encounter for immunization: Secondary | ICD-10-CM

## 2014-07-11 LAB — BASIC METABOLIC PANEL
BUN: 24 mg/dL — AB (ref 6–23)
CHLORIDE: 102 meq/L (ref 96–112)
CO2: 28 mEq/L (ref 19–32)
Calcium: 9.7 mg/dL (ref 8.4–10.5)
Creatinine, Ser: 1.3 mg/dL — ABNORMAL HIGH (ref 0.4–1.2)
GFR: 53.32 mL/min — ABNORMAL LOW (ref >60.00)
Glucose, Bld: 113 mg/dL — ABNORMAL HIGH (ref 70–99)
POTASSIUM: 3.9 meq/L (ref 3.5–5.1)
Sodium: 138 mEq/L (ref 135–145)

## 2014-07-11 LAB — HEMOGLOBIN A1C: Hgb A1c MFr Bld: 7 % — ABNORMAL HIGH (ref 4.6–6.5)

## 2014-07-11 MED ORDER — PHENTERMINE HCL 37.5 MG PO TABS: 37.5000 mg | ORAL_TABLET | Freq: Every day | ORAL | Status: DC

## 2014-07-11 MED ORDER — LISINOPRIL-HYDROCHLOROTHIAZIDE 20-25 MG PO TABS: 1.0000 | ORAL_TABLET | Freq: Every day | ORAL | Status: DC

## 2014-07-11 NOTE — Progress Notes (Signed)
Pre visit review using our clinic review tool, if applicable. No additional management support is needed unless otherwise documented below in the visit note. 

## 2014-07-11 NOTE — Progress Notes (Signed)
Subjective:    Patient ID: Holly [name], female    DOB: 1941-11-04, 73 y.o.   MRN: [id number]  Diabetes She presents for her follow-up diabetic visit. She has type 2 diabetes mellitus. Her disease course has been stable. There are no hypoglycemic associated symptoms. Pertinent negatives for hypoglycemia include no dizziness, headaches or tremors. There are no diabetic associated symptoms. Pertinent negatives for diabetes include no blurred vision, no chest pain, no fatigue, no foot paresthesias, no foot ulcerations, no polydipsia, no polyphagia, no polyuria, no visual change, no weakness and no weight loss. There are no hypoglycemic complications. There are no diabetic complications. Current diabetic treatment includes oral agent (dual therapy). She is compliant with treatment most of the time. Her weight is decreasing steadily. She is following a generally healthy diet. Meal planning includes avoidance of concentrated sweets. She has not had a previous visit with a dietician. She participates in exercise intermittently. An ACE inhibitor/angiotensin II receptor blocker is being taken. She does not see a podiatrist.Eye exam is current.      Review of Systems  Constitutional: Negative.  Negative for fever, chills, weight loss, diaphoresis, appetite change and fatigue.  HENT: Negative.   Eyes: Negative.  Negative for blurred vision.  Respiratory: Negative.  Negative for cough, choking, chest tightness, shortness of breath, wheezing and stridor.   Cardiovascular: Negative.  Negative for chest pain, palpitations and leg swelling.  Gastrointestinal: Negative.  Negative for nausea, abdominal pain, diarrhea, constipation and blood in stool.  Endocrine: Negative.  Negative for polydipsia, polyphagia and polyuria.  Genitourinary: Negative.   Musculoskeletal: Positive for arthralgias. Negative for back pain, gait problem, joint swelling, myalgias, neck pain and neck stiffness.  Skin: Negative.   Negative for rash.  Allergic/Immunologic: Negative.   Neurological: Negative.  Negative for dizziness, tremors, syncope, weakness, light-headedness, numbness and headaches.  Hematological: Negative.  Negative for adenopathy. Does not bruise/bleed easily.  Psychiatric/Behavioral: Negative.        Objective:   Physical Exam  Vitals reviewed. Constitutional: She is oriented to person, place, and time. She appears well-developed and well-nourished. No distress.  HENT:  Head: Normocephalic and atraumatic.  Mouth/Throat: Oropharynx is clear and moist. No oropharyngeal exudate.  Eyes: Conjunctivae are normal. Right eye exhibits no discharge. Left eye exhibits no discharge. No scleral icterus.  Neck: Normal range of motion. Neck supple. No JVD present. No tracheal deviation present. No thyromegaly present.  Cardiovascular: Normal rate, regular rhythm, normal heart sounds and intact distal pulses.  Exam reveals no gallop and no friction rub.   No murmur heard. Pulmonary/Chest: Effort normal and breath sounds normal. No stridor. No respiratory distress. She has no wheezes. She has no rales. She exhibits no tenderness.  Abdominal: Soft. Bowel sounds are normal. She exhibits no distension and no mass. There is no tenderness. There is no rebound and no guarding.  Genitourinary: There is no rash, tenderness, lesion or injury on the right labia. There is no rash, tenderness, lesion or injury on the left labia.  Musculoskeletal: Normal range of motion. She exhibits no edema and no tenderness.  Lymphadenopathy:    She has no cervical adenopathy.  Neurological: She is oriented to person, place, and time.  Skin: Skin is warm and dry. No rash noted. She is not diaphoretic. No erythema. No pallor.     Lab Results  Component Value Date   WBC 6.2 02/12/2010   HGB 14.1 02/12/2010   HCT 41.3 02/12/2010   PLT 237.0 02/12/2010   GLUCOSE  148* 04/08/2014   CHOL 188 11/05/2013   TRIG 169.0* 11/05/2013   HDL 59.70  11/05/2013   LDLDIRECT 134.8 08/27/2010   LDLCALC 95 11/05/2013   ALT 19 11/05/2013   AST 17 11/05/2013   NA 137 04/08/2014   K 3.6 04/08/2014   CL 100 04/08/2014   CREATININE 1.0 04/08/2014   BUN 21 04/08/2014   CO2 28 04/08/2014   TSH 3.41 05/06/2013   HGBA1C 6.8* 04/08/2014       Assessment & Plan:

## 2014-07-11 NOTE — Patient Instructions (Signed)

## 2014-07-12 ENCOUNTER — Encounter: Payer: Self-pay | Admitting: Internal Medicine

## 2014-07-12 NOTE — Assessment & Plan Note (Signed)
This has resolved with topical steroids

## 2014-07-12 NOTE — Assessment & Plan Note (Signed)
She has lost weight with phentermine and is tolerating it well Will cont for now

## 2014-07-12 NOTE — Assessment & Plan Note (Signed)
Her BP is well controlled but her renal function has declined some (I believe due to the nsaids) She was notified and will try to discontinue her nsaid usage

## 2014-07-12 NOTE — Assessment & Plan Note (Signed)
She can't afford tradjenta - will discontinue this Her blood sugars have been adequately well controlled

## 2014-07-14 ENCOUNTER — Encounter: Payer: Self-pay | Admitting: Family Medicine

## 2014-07-14 ENCOUNTER — Ambulatory Visit (INDEPENDENT_AMBULATORY_CARE_PROVIDER_SITE_OTHER): Payer: Medicare PPO | Admitting: Family Medicine

## 2014-07-14 VITALS — BP 150/92 | HR 82 | Ht 69.5 in | Wt 322.0 lb

## 2014-07-14 DIAGNOSIS — M1710 Unilateral primary osteoarthritis, unspecified knee: Secondary | ICD-10-CM

## 2014-07-14 DIAGNOSIS — M1711 Unilateral primary osteoarthritis, right knee: Secondary | ICD-10-CM

## 2014-07-14 NOTE — Patient Instructions (Signed)
Good to see you  Alvera Singh is your friend.  Continue the brace.  We will try to get a sleeve.  See you on wednesday of next weeks.

## 2014-07-14 NOTE — Progress Notes (Signed)
  CC: Right knee pain  HPI: Patient is a very pleasant 73 year old female coming in with right right knee pain. Patient was seen one month ago. Patient at corticosteroid injection into the right knee for her underlying knee arthritis. Patient was also given a brace, home exercise program, and icing protocol. Patient states no improvement. Patient is also failed physical therapy previously. Patient has failed all other conservative therapy. Patient is here for her second injection of Synvisc. Patient states that she went approximately 50% improvement after the first injection. Continuing the topical anti-inflammatory as well. Overall she does think she's improving.  Past medical, surgical, family and social history reviewed. Medications reviewed all in the electronic medical record.   Review of Systems: No headache, visual changes, nausea, vomiting, diarrhea, constipation, dizziness, abdominal pain, skin rash, fevers, chills, night sweats, weight loss, swollen lymph nodes, body aches, joint swelling, muscle aches, chest pain, shortness of breath, mood changes.   Objective:    Blood pressure 150/92, pulse 82, height 5' 9.5" (1.765 m), weight 322 lb (146.058 kg), SpO2 97.00%.   General: No apparent distress alert and oriented x3 mood and affect normal, dressed appropriately.  HEENT: Pupils equal, extraocular movements intact Respiratory: Patient's speak in full sentences and does not appear short of breath Cardiovascular: No lower extremity edema, non tender, no erythema Skin: Warm dry intact with no signs of infection or rash on extremities or on axial skeleton. Abdomen: Soft nontender Neuro: Cranial nerves II through XII are intact, neurovascularly intact in all extremities with 2+ DTRs and 2+ pulses. Lymph: No lymphadenopathy of posterior or anterior cervical chain or axillae bilaterally.  Gait this patient with an antalgic gait walking with a cane keeping the foot in dorsiflexion on the right  side. MSK: Non tender with full range of motion and good stability and symmetric strength and tone of shoulders, elbows, wrist and ankles bilaterally.  Knee: Right Normal to inspection with no erythema or effusion or obvious bony abnormalities. Palpation normal with no warmth, joint line tenderness, patellar tenderness, or condyle tenderness. ROM full in flexion and extension and lower leg rotation. Ligaments with solid consistent endpoints including ACL, PCL, LCL, MCL. Negative Mcmurray's, Apley's, and Thessalonian tests. Non painful patellar compression. Patellar glide without crepitus. Patellar and quadriceps tendons unremarkable. Hamstring and quadriceps strength is normal.   After informed written and verbal consent, patient was seated on exam table. Right knee was prepped with alcohol swab and utilizing anterolateral approach, patient's right knee space was injected with16 mg/2.5 mL of Synvisc (sodium hyaluronate) in a prefilled syringe was injected easily into the knee through a 22-gauge needle.  Impression and Recommendations:     This case required medical decision making of moderate complexity.

## 2014-07-14 NOTE — Assessment & Plan Note (Signed)
The patient was given second in a series of 3 injections of the Synvisc today. We discussed her continuing S. tubercle, home exercises, and topical anti-inflammatories. Patient will return and see me again in one week for third and final injection.

## 2014-07-17 ENCOUNTER — Other Ambulatory Visit: Payer: Self-pay

## 2014-07-17 MED ORDER — SIMVASTATIN 40 MG PO TABS: ORAL_TABLET | ORAL | Status: DC

## 2014-07-23 ENCOUNTER — Encounter: Payer: Self-pay | Admitting: Family Medicine

## 2014-07-23 ENCOUNTER — Ambulatory Visit (INDEPENDENT_AMBULATORY_CARE_PROVIDER_SITE_OTHER): Payer: Medicare PPO | Admitting: Family Medicine

## 2014-07-23 VITALS — BP 142/88 | HR 68 | Ht 69.5 in | Wt 320.0 lb

## 2014-07-23 DIAGNOSIS — M1711 Unilateral primary osteoarthritis, right knee: Secondary | ICD-10-CM

## 2014-07-23 DIAGNOSIS — M1710 Unilateral primary osteoarthritis, unspecified knee: Secondary | ICD-10-CM

## 2014-07-23 NOTE — Patient Instructions (Addendum)
It is so good to see you Continue the brace when you need it, ice at the end of activity Continue the oil See me again in 1 month.

## 2014-07-23 NOTE — Assessment & Plan Note (Signed)
Patient third and final Synvisc injection today. We discussed an icing protocol home exercises. Patient will try to wear the braces less and less frequently. Patient can continue the topical anti-inflammatory. We'll have patient followup in one month to make sure that she continues to do well.

## 2014-07-23 NOTE — Progress Notes (Signed)
  CC: Right knee pain  HPI: Patient is a very pleasant 73 year old female coming in with right knee pain. Patient was seen one month ago. Patient at corticosteroid injection into the right knee for her underlying knee arthritis. Patient was also given a brace, home exercise program, and icing protocol. Patient states no improvement. Patient is also failed physical therapy previously. Patient has failed all other conservative therapy. Patient is here for her third injection of Synvisc. Patient states she is approximately another 25% better. Patient is no she's been walking more. Patient continues with the icing protocol. Continues with a topical anti-inflammatory. No new symptoms   Past medical, surgical, family and social history reviewed. Medications reviewed all in the electronic medical record.   Review of Systems: No headache, visual changes, nausea, vomiting, diarrhea, constipation, dizziness, abdominal pain, skin rash, fevers, chills, night sweats, weight loss, swollen lymph nodes, body aches, joint swelling, muscle aches, chest pain, shortness of breath, mood changes.   Objective:    Blood pressure 142/88, pulse 68, height 5' 9.5" (1.765 m), weight 320 lb (145.151 kg).   General: No apparent distress alert and oriented x3 mood and affect normal, dressed appropriately.  HEENT: Pupils equal, extraocular movements intact Respiratory: Patient's speak in full sentences and does not appear short of breath Cardiovascular: No lower extremity edema, non tender, no erythema Skin: Warm dry intact with no signs of infection or rash on extremities or on axial skeleton. Abdomen: Soft nontender Neuro: Cranial nerves II through XII are intact, neurovascularly intact in all extremities with 2+ DTRs and 2+ pulses. Lymph: No lymphadenopathy of posterior or anterior cervical chain or axillae bilaterally.  Gait this patient with an antalgic gait walking with a cane keeping the foot in dorsiflexion on the  right side. MSK: Non tender with full range of motion and good stability and symmetric strength and tone of shoulders, elbows, wrist and ankles bilaterally.  Knee: Right Normal to inspection with no erythema or effusion or obvious bony abnormalities. Palpation normal with no warmth, joint line tenderness, patellar tenderness, or condyle tenderness. ROM full in flexion and extension and lower leg rotation. Ligaments with solid consistent endpoints including ACL, PCL, LCL, MCL. Negative Mcmurray's, Apley's, and Thessalonian tests. Non painful patellar compression. Patellar glide without crepitus. Patellar and quadriceps tendons unremarkable. Hamstring and quadriceps strength is normal.   After informed written and verbal consent, patient was seated on exam table. Right knee was prepped with alcohol swab and utilizing anterolateral approach, patient's right knee space was injected with16 mg/2.5 mL of Synvisc (sodium hyaluronate) in a prefilled syringe was injected easily into the knee through a 22-gauge needle.  Impression and Recommendations:     This case required medical decision making of moderate complexity.

## 2014-07-30 ENCOUNTER — Ambulatory Visit: Payer: Medicare PPO | Admitting: Family Medicine

## 2014-08-06 ENCOUNTER — Encounter: Payer: Self-pay | Admitting: Internal Medicine

## 2014-08-06 ENCOUNTER — Other Ambulatory Visit (INDEPENDENT_AMBULATORY_CARE_PROVIDER_SITE_OTHER): Payer: Medicare PPO

## 2014-08-06 ENCOUNTER — Ambulatory Visit (INDEPENDENT_AMBULATORY_CARE_PROVIDER_SITE_OTHER): Payer: Medicare PPO | Admitting: Internal Medicine

## 2014-08-06 VITALS — BP 130/86 | HR 93 | Temp 98.1°F | Resp 16 | Ht 69.5 in | Wt 315.0 lb

## 2014-08-06 DIAGNOSIS — I10 Essential (primary) hypertension: Secondary | ICD-10-CM

## 2014-08-06 DIAGNOSIS — N183 Chronic kidney disease, stage 3 unspecified: Secondary | ICD-10-CM

## 2014-08-06 LAB — URINALYSIS, ROUTINE W REFLEX MICROSCOPIC
BILIRUBIN URINE: NEGATIVE
KETONES UR: NEGATIVE
Nitrite: POSITIVE — AB
Specific Gravity, Urine: 1.025 (ref 1.000–1.030)
Total Protein, Urine: 100 — AB
UROBILINOGEN UA: 0.2 (ref 0.0–1.0)
Urine Glucose: NEGATIVE
pH: 5.5 (ref 5.0–8.0)

## 2014-08-06 LAB — BASIC METABOLIC PANEL
BUN: 19 mg/dL (ref 6–23)
CALCIUM: 9.8 mg/dL (ref 8.4–10.5)
CO2: 28 meq/L (ref 19–32)
CREATININE: 1.1 mg/dL (ref 0.4–1.2)
Chloride: 102 mEq/L (ref 96–112)
GFR: 63.6 mL/min (ref >60.00)
GLUCOSE: 142 mg/dL — AB (ref 70–99)
Potassium: 4.1 mEq/L (ref 3.5–5.1)
Sodium: 137 mEq/L (ref 135–145)

## 2014-08-06 MED ORDER — METFORMIN HCL 1000 MG PO TABS: ORAL_TABLET | ORAL | Status: DC

## 2014-08-06 NOTE — Assessment & Plan Note (Signed)
Her BP is well controlled 

## 2014-08-06 NOTE — Progress Notes (Signed)
Pre visit review using our clinic review tool, if applicable. No additional management support is needed unless otherwise documented below in the visit note. 

## 2014-08-06 NOTE — Progress Notes (Signed)
Subjective:    Patient ID: Holly [name], female    DOB: Oct 19, 1941, 73 y.o.   MRN: [id number]  Hypertension This is a chronic problem. The current episode started more than 1 year ago. The problem has been gradually improving since onset. The problem is controlled. Pertinent negatives include no anxiety, blurred vision, chest pain, headaches, malaise/fatigue, neck pain, orthopnea, palpitations, peripheral edema, PND, shortness of breath or sweats. Agents associated with hypertension include amphetamines. Past treatments include ACE inhibitors and diuretics. The current treatment provides moderate improvement. Compliance problems include diet and exercise.  Hypertensive end-organ damage includes kidney disease. Identifiable causes of hypertension include chronic renal disease.      Review of Systems  Constitutional: Negative.  Negative for fever, chills, malaise/fatigue, diaphoresis, appetite change and fatigue.  HENT: Negative.   Eyes: Negative.  Negative for blurred vision.  Respiratory: Negative.  Negative for cough, choking, chest tightness, shortness of breath and stridor.   Cardiovascular: Negative.  Negative for chest pain, palpitations, orthopnea, leg swelling and PND.  Gastrointestinal: Negative.  Negative for nausea, vomiting, abdominal pain, diarrhea, constipation and blood in stool.  Endocrine: Negative.   Genitourinary: Negative.  Negative for dysuria, urgency, frequency, hematuria, flank pain, decreased urine volume and difficulty urinating.  Musculoskeletal: Negative.  Negative for arthralgias, back pain and neck pain.  Skin: Negative.  Negative for rash.  Allergic/Immunologic: Negative.   Neurological: Negative.  Negative for dizziness, syncope, speech difficulty, weakness, light-headedness, numbness and headaches.  Hematological: Negative.  Negative for adenopathy. Does not bruise/bleed easily.  Psychiatric/Behavioral: Negative.        Objective:   Physical Exam    Vitals reviewed. Constitutional: She is oriented to person, place, and time. She appears well-developed and well-nourished. No distress.  HENT:  Head: Normocephalic and atraumatic.  Mouth/Throat: Oropharynx is clear and moist. No oropharyngeal exudate.  Eyes: Conjunctivae are normal. Right eye exhibits no discharge. Left eye exhibits no discharge. No scleral icterus.  Neck: Normal range of motion. Neck supple. No JVD present. No tracheal deviation present. No thyromegaly present.  Cardiovascular: Normal rate, regular rhythm, normal heart sounds and intact distal pulses.  Exam reveals no gallop and no friction rub.   No murmur heard. Pulmonary/Chest: Effort normal and breath sounds normal. No stridor. No respiratory distress. She has no wheezes. She has no rales. She exhibits no tenderness.  Abdominal: Soft. Bowel sounds are normal. She exhibits no distension and no mass. There is no tenderness. There is no rebound and no guarding.  Musculoskeletal: Normal range of motion. She exhibits no edema and no tenderness.  Lymphadenopathy:    She has no cervical adenopathy.  Neurological: She is oriented to person, place, and time.  Skin: Skin is warm and dry. No rash noted. She is not diaphoretic. No erythema. No pallor.  Psychiatric: She has a normal mood and affect. Her behavior is normal. Judgment and thought content normal.     Lab Results  Component Value Date   WBC 6.2 02/12/2010   HGB 14.1 02/12/2010   HCT 41.3 02/12/2010   PLT 237.0 02/12/2010   GLUCOSE 113* 07/11/2014   CHOL 188 11/05/2013   TRIG 169.0* 11/05/2013   HDL 59.70 11/05/2013   LDLDIRECT 134.8 08/27/2010   LDLCALC 95 11/05/2013   ALT 19 11/05/2013   AST 17 11/05/2013   NA 138 07/11/2014   K 3.9 07/11/2014   CL 102 07/11/2014   CREATININE 1.3* 07/11/2014   BUN 24* 07/11/2014   CO2 28 07/11/2014  TSH 3.41 05/06/2013   HGBA1C 7.0* 07/11/2014       Assessment & Plan:

## 2014-08-06 NOTE — Patient Instructions (Signed)

## 2014-08-06 NOTE — Assessment & Plan Note (Signed)
On recheck today her renal function has improved She will avoid nephrotoxic agents Will cont the current meds for now

## 2014-08-11 ENCOUNTER — Telehealth: Payer: Self-pay | Admitting: Internal Medicine

## 2014-08-11 NOTE — Telephone Encounter (Signed)
Patient states she received labs from Dr. Ronnald Ramp that were good.  She states she is to have an ultrasound tomorrow.  She would like to know if she still needs to go.

## 2014-08-12 ENCOUNTER — Ambulatory Visit
Admission: RE | Admit: 2014-08-12 | Discharge: 2014-08-12 | Disposition: A | Discharge status: Home / Self Care | Payer: Medicare PPO | Source: Ambulatory Visit | Attending: Internal Medicine | Admitting: Internal Medicine

## 2014-08-12 DIAGNOSIS — N183 Chronic kidney disease, stage 3 unspecified: Secondary | ICD-10-CM

## 2014-08-12 NOTE — Telephone Encounter (Signed)
Stretching, ice 20 minutes 2 times daily.  Compression sleeve on the calf.  If not better would have patient come in next week.

## 2014-08-12 NOTE — Telephone Encounter (Signed)
Spoke with patient and notified per MD. While speaking with patient, she c/o rear, bilateral calf and thigh pain x 3 days. States no swelling or change in color, and applying heat helps a little better than ice. She would like advisement from Dr. Tamala Julian as to what may be causing this and what can help to relieve these cramps. Thanks

## 2014-08-12 NOTE — Telephone Encounter (Signed)
yes

## 2014-08-13 NOTE — Telephone Encounter (Signed)
Pt notified and requested to wait and see pcp on 08-18-14.

## 2014-08-18 ENCOUNTER — Encounter: Payer: Self-pay | Admitting: Internal Medicine

## 2014-08-18 ENCOUNTER — Other Ambulatory Visit (INDEPENDENT_AMBULATORY_CARE_PROVIDER_SITE_OTHER): Payer: Medicare PPO

## 2014-08-18 ENCOUNTER — Telehealth: Payer: Self-pay | Admitting: Internal Medicine

## 2014-08-18 ENCOUNTER — Telehealth: Payer: Self-pay | Admitting: *Deleted

## 2014-08-18 ENCOUNTER — Ambulatory Visit (INDEPENDENT_AMBULATORY_CARE_PROVIDER_SITE_OTHER): Payer: Medicare PPO | Admitting: Internal Medicine

## 2014-08-18 VITALS — BP 124/88 | HR 90 | Temp 97.8°F | Resp 16 | Ht 69.5 in | Wt 315.0 lb

## 2014-08-18 DIAGNOSIS — R791 Abnormal coagulation profile: Secondary | ICD-10-CM

## 2014-08-18 DIAGNOSIS — M79662 Pain in left lower leg: Secondary | ICD-10-CM

## 2014-08-18 DIAGNOSIS — R7989 Other specified abnormal findings of blood chemistry: Secondary | ICD-10-CM | POA: Insufficient documentation

## 2014-08-18 DIAGNOSIS — M79661 Pain in right lower leg: Secondary | ICD-10-CM

## 2014-08-18 DIAGNOSIS — I70219 Atherosclerosis of native arteries of extremities with intermittent claudication, unspecified extremity: Secondary | ICD-10-CM

## 2014-08-18 DIAGNOSIS — I10 Essential (primary) hypertension: Secondary | ICD-10-CM

## 2014-08-18 LAB — BASIC METABOLIC PANEL
BUN: 26 mg/dL — AB (ref 6–23)
CHLORIDE: 100 meq/L (ref 96–112)
CO2: 26 mEq/L (ref 19–32)
Calcium: 9.5 mg/dL (ref 8.4–10.5)
Creatinine, Ser: 1 mg/dL (ref 0.4–1.2)
GFR: 71.06 mL/min (ref >60.00)
GLUCOSE: 145 mg/dL — AB (ref 70–99)
POTASSIUM: 3.5 meq/L (ref 3.5–5.1)
Sodium: 133 mEq/L — ABNORMAL LOW (ref 135–145)

## 2014-08-18 LAB — CK: Total CK: 71 U/L (ref 7–177)

## 2014-08-18 LAB — SEDIMENTATION RATE: Sed Rate: 19 mm/hr (ref 0–22)

## 2014-08-18 LAB — D-DIMER, QUANTITATIVE (NOT AT ARMC): D DIMER QUANT: 3.96 ug{FEU}/mL — AB (ref 0.00–0.48)

## 2014-08-18 MED ORDER — HYDROCODONE-ACETAMINOPHEN 5-325 MG PO TABS: 1.0000 | ORAL_TABLET | Freq: Four times a day (QID) | ORAL | Status: DC | PRN

## 2014-08-18 NOTE — Progress Notes (Addendum)
Subjective:    Patient ID: Holly [name], female    DOB: 01-09-1941, 73 y.o.   MRN: [id number]  HPI Comments: She complains of a 2 week history of pain over both knees and calves, the pain is worse with movement and activity especially when she walks. She has a remote history of tobacco abuse.     Review of Systems  Constitutional: Negative.  Negative for fever, chills, diaphoresis, appetite change and fatigue.  HENT: Negative.   Eyes: Negative.   Respiratory: Negative.  Negative for apnea, cough, choking, chest tightness, shortness of breath, wheezing and stridor.   Cardiovascular: Negative.  Negative for chest pain, palpitations and leg swelling.  Gastrointestinal: Negative.  Negative for nausea, vomiting, abdominal pain, diarrhea, constipation, blood in stool, abdominal distention, anal bleeding and rectal pain.  Endocrine: Negative.   Genitourinary: Negative.   Musculoskeletal: Positive for arthralgias, back pain and myalgias (both calves). Negative for gait problem, joint swelling, neck pain and neck stiffness.  Skin: Negative.  Negative for rash.  Allergic/Immunologic: Negative.   Neurological: Negative.  Negative for dizziness, tremors, seizures, syncope, facial asymmetry, speech difficulty, weakness, light-headedness and headaches.  Hematological: Negative.  Negative for adenopathy. Does not bruise/bleed easily.  Psychiatric/Behavioral: Negative.        Objective:   Physical Exam  Vitals reviewed. Constitutional: She is oriented to person, place, and time. She appears well-developed and well-nourished. No distress.  HENT:  Head: Normocephalic and atraumatic.  Mouth/Throat: Oropharynx is clear and moist. No oropharyngeal exudate.  Eyes: Conjunctivae are normal. Right eye exhibits no discharge. Left eye exhibits no discharge. No scleral icterus.  Neck: Normal range of motion. Neck supple. No JVD present. No tracheal deviation present. No thyromegaly present.    Cardiovascular: Normal rate, regular rhythm, S1 normal, S2 normal, normal heart sounds and intact distal pulses.  Exam reveals no gallop and no friction rub.   No murmur heard. Pulses:      Carotid pulses are 2+ on the right side, and 2+ on the left side.      Radial pulses are 2+ on the right side, and 2+ on the left side.       Femoral pulses are 0 on the right side, and 0 on the left side.      Popliteal pulses are 0 on the right side, and 0 on the left side.       Dorsalis pedis pulses are 1+ on the right side, and 1+ on the left side.       Posterior tibial pulses are 0 on the right side, and 0 on the left side.  Pulmonary/Chest: Effort normal and breath sounds normal. No stridor. No respiratory distress. She has no wheezes. She has no rales. She exhibits no tenderness.  Abdominal: Soft. Bowel sounds are normal. She exhibits no distension and no mass. There is no tenderness. There is no rebound and no guarding.  Musculoskeletal: Normal range of motion. She exhibits no edema and no tenderness.       Right knee: Normal. She exhibits normal range of motion, no swelling, no effusion, no ecchymosis, no deformity, no laceration, no erythema, normal alignment, no LCL laxity, normal patellar mobility and no bony tenderness. No tenderness found.       Left knee: Normal. She exhibits normal range of motion, no swelling, no effusion, no ecchymosis, no deformity, no laceration, no erythema, normal alignment, no LCL laxity, normal patellar mobility and no bony tenderness. No tenderness found.  Lumbar back: Normal.  Lymphadenopathy:    She has no cervical adenopathy.  Neurological: She is alert and oriented to person, place, and time. She has normal strength. She displays no atrophy, no tremor and normal reflexes. No cranial nerve deficit or sensory deficit. She exhibits normal muscle tone. She displays a negative Romberg sign. She displays no seizure activity. Coordination and gait normal.  Reflex  Scores:      Tricep reflexes are 1+ on the right side and 1+ on the left side.      Bicep reflexes are 1+ on the right side and 1+ on the left side.      Brachioradialis reflexes are 1+ on the right side and 1+ on the left side.      Patellar reflexes are 1+ on the right side and 1+ on the left side.      Achilles reflexes are 1+ on the right side and 1+ on the left side. Neg SLR in BLE  Skin: Skin is warm and dry. No rash noted. She is not diaphoretic. No erythema. No pallor.  Psychiatric: She has a normal mood and affect. Her behavior is normal. Judgment and thought content normal.     Lab Results  Component Value Date   WBC 6.2 02/12/2010   HGB 14.1 02/12/2010   HCT 41.3 02/12/2010   PLT 237.0 02/12/2010   GLUCOSE 142* 08/06/2014   CHOL 188 11/05/2013   TRIG 169.0* 11/05/2013   HDL 59.70 11/05/2013   LDLDIRECT 134.8 08/27/2010   LDLCALC 95 11/05/2013   ALT 19 11/05/2013   AST 17 11/05/2013   NA 137 08/06/2014   K 4.1 08/06/2014   CL 102 08/06/2014   CREATININE 1.1 08/06/2014   BUN 19 08/06/2014   CO2 28 08/06/2014   TSH 3.41 05/06/2013   HGBA1C 7.0* 07/11/2014       Assessment & Plan:

## 2014-08-18 NOTE — Assessment & Plan Note (Signed)
I have ordered a LE arterial flow study to see how severe this is

## 2014-08-18 NOTE — Telephone Encounter (Signed)
CRITICAL LAB- D-dimer high @ 3.96.../lmb

## 2014-08-18 NOTE — Progress Notes (Signed)
Pre visit review using our clinic review tool, if applicable. No additional management support is needed unless otherwise documented below in the visit note. 

## 2014-08-18 NOTE — Addendum Note (Signed)
Addended by: [name] on: 08/18/2014 02:49 PM   Modules accepted: Orders

## 2014-08-18 NOTE — Assessment & Plan Note (Signed)
Her BP is well controlled 

## 2014-08-18 NOTE — Patient Instructions (Signed)
Intermittent Claudication Blockage of leg arteries results from poor circulation of blood in the leg arteries. This produces an aching, tired, and sometimes burning pain in the legs that is brought on by exercise and made better by rest. Claudication refers to the limping that happens from leg cramps. It is also referred to as Vaso-occlusive disease of the legs, arterial insufficiency of the legs, recurrent leg pain, recurrent leg cramping and calf pain with exercise.  CAUSES  This condition is due to narrowing or blockage of the arteries (muscular vessels which carry blood away from the heart and around the body). Blockage of arteries can occur anywhere in the body. If they occur in the heart, a person may experience angina (chest pain) or even a heart attack. If they occur in the neck or the brain, a person may have a stroke. Intermittent claudication is when the blockage occurs in the legs, most commonly in the calf or the foot.  Atherosclerosis, or blockage of arteries, can occur for many reasons. Some of these are smoking, diabetes, and high cholesterol. SYMPTOMS  Intermittent claudication may occur in both legs, and it often continues to get worse over time. However, some people complain only of weakness in the legs when walking, or a feeling of "tiredness" in the buttocks. Impotence (not able to have an erection) is an occasional complaint in men. Pain while resting is uncommon.  WHAT TO EXPECT AT YOUR HEALTH CARE PROVIDER'S OFFICE: Your medical history will be asked for and a physical examination will be performed. Medical history questions documenting claudication in detail may include:   Time pattern  Do you have leg cramps at night (nocturnal cramps)?  How often does leg pain with cramping occur?  Is it getting worse?  What is the quality of the pain?  Is the pain sharp?  Is there an aching pain with the cramps?  Aggravating factors  Is it worse after you exercise?  Is it  worse after you are standing for a while?  Do you smoke? How much?  Do you drink alcohol? How much?  Are you diabetic? How well is your blood sugar controlled?  Other  What other symptoms are also present?  Has there been impotence (men)?  Is there pain in the back?  Is there a darkening of the skin of the legs, feet or toes?  Is there weakness or paralysis of the legs? The physical examination may include evaluation of the femoral pulse (in the groin) and the other areas where the pulse can be felt in the legs. DIAGNOSIS  Diagnostic tests that may be performed include:  Blood pressure measured in arms and legs for comparison.  Doppler ultrasonography on the legs and the heart.  Duplex Doppler/ultrasound exam of extremity to visualize arterial blood flow.  ECG- to evaluate the activity of your heart.  Aortography- to visualize blockages in your arteries. TREATMENT Surgical treatment may be suggested if claudication interferes with the patient's activities or work, and if the diseased arteries do not seem to be improving after treatment. Be aware that this condition can worsen over time and you should carefully monitor your condition. HOME CARE INSTRUCTIONS  Talk to your caregiver about the cause of your leg cramping and about what to do at home to relieve it.  A healthy diet is important to lessen the likeliness of atherosclerosis.  A program of daily walking for short periods, and stopping for pain or cramping, may help improve function.  It is important to   stop smoking.  Avoid putting hot or cold items on legs.  Avoid tight shoes. SEEK MEDICAL CARE IF: There are many other causes of leg pain such as arthritis or low blood potassium. However, some causes of leg pain may be life threatening such as a blood clot in the legs. Seek medical attention if you have:  Leg pain that does not go away.  Legs that may be red, hot or swollen.  Ulcers or sores appear on your  ankle or foot.  Any chest pain or shortness of breath accompanying leg pain.  Diabetes.  You are pregnant. SEEK IMMEDIATE MEDICAL CARE IF:   Your leg pain becomes severe or will not go away.  Your foot turns blue or a dark color.  Your leg becomes red, hot or swollen or you develop a fever over 102F.  Any chest pain or shortness of breath accompanying leg pain. MAKE SURE YOU:   Understand these instructions.  Will watch your condition.  Will get help right away if you are not doing well or get worse. Document Released: 09/02/2004 Document Revised: 01/23/2012 Document Reviewed: 02/06/2014 [employer] Patient Information 2015 [employer]. This information is not intended to replace advice given to you by your health care provider. Make sure you discuss any questions you have with your health care provider.  

## 2014-08-18 NOTE — Telephone Encounter (Signed)
Labs resulted in epic

## 2014-08-18 NOTE — Telephone Encounter (Signed)
Candace called in from [place] and wanted to give report on pt, Best number to reach her is [phone]

## 2014-08-18 NOTE — Assessment & Plan Note (Signed)
This may be referred pain from [facility], will start norco for the pain Will check a D-dimer to see if there is concern for DVT Will check for myopathy with ESR, CPK, BMP I am concerned about arterial disease as well so have ordered a STAT arterial flow study

## 2014-08-19 ENCOUNTER — Ambulatory Visit (HOSPITAL_COMMUNITY): Payer: Medicare PPO | Attending: Cardiology | Admitting: Radiology

## 2014-08-19 DIAGNOSIS — I10 Essential (primary) hypertension: Secondary | ICD-10-CM | POA: Diagnosis not present

## 2014-08-19 DIAGNOSIS — E119 Type 2 diabetes mellitus without complications: Secondary | ICD-10-CM | POA: Diagnosis not present

## 2014-08-19 DIAGNOSIS — R791 Abnormal coagulation profile: Secondary | ICD-10-CM

## 2014-08-19 DIAGNOSIS — M79605 Pain in left leg: Secondary | ICD-10-CM | POA: Insufficient documentation

## 2014-08-19 DIAGNOSIS — E669 Obesity, unspecified: Secondary | ICD-10-CM | POA: Diagnosis not present

## 2014-08-19 DIAGNOSIS — M79606 Pain in leg, unspecified: Secondary | ICD-10-CM | POA: Diagnosis present

## 2014-08-19 DIAGNOSIS — M79661 Pain in right lower leg: Secondary | ICD-10-CM

## 2014-08-19 DIAGNOSIS — Z87891 Personal history of nicotine dependence: Secondary | ICD-10-CM | POA: Insufficient documentation

## 2014-08-19 DIAGNOSIS — M79604 Pain in right leg: Secondary | ICD-10-CM | POA: Diagnosis not present

## 2014-08-19 DIAGNOSIS — R7989 Other specified abnormal findings of blood chemistry: Secondary | ICD-10-CM

## 2014-08-19 DIAGNOSIS — M79662 Pain in left lower leg: Secondary | ICD-10-CM

## 2014-08-19 NOTE — Progress Notes (Signed)
Lower extremity Venous Duplex performed.

## 2014-08-20 ENCOUNTER — Ambulatory Visit (HOSPITAL_COMMUNITY): Payer: Medicare PPO | Attending: Internal Medicine | Admitting: Cardiology

## 2014-08-20 DIAGNOSIS — I70219 Atherosclerosis of native arteries of extremities with intermittent claudication, unspecified extremity: Secondary | ICD-10-CM

## 2014-08-20 DIAGNOSIS — I10 Essential (primary) hypertension: Secondary | ICD-10-CM | POA: Insufficient documentation

## 2014-08-20 DIAGNOSIS — R0989 Other specified symptoms and signs involving the circulatory and respiratory systems: Secondary | ICD-10-CM

## 2014-08-20 DIAGNOSIS — E0859 Diabetes mellitus due to underlying condition with other circulatory complications: Secondary | ICD-10-CM

## 2014-08-20 DIAGNOSIS — J449 Chronic obstructive pulmonary disease, unspecified: Secondary | ICD-10-CM | POA: Diagnosis not present

## 2014-08-20 DIAGNOSIS — E785 Hyperlipidemia, unspecified: Secondary | ICD-10-CM | POA: Diagnosis not present

## 2014-08-20 DIAGNOSIS — M79662 Pain in left lower leg: Secondary | ICD-10-CM

## 2014-08-20 DIAGNOSIS — E119 Type 2 diabetes mellitus without complications: Secondary | ICD-10-CM | POA: Diagnosis not present

## 2014-08-20 DIAGNOSIS — M79661 Pain in right lower leg: Secondary | ICD-10-CM

## 2014-08-20 NOTE — Progress Notes (Signed)
Lower arterial doppler performed. 

## 2014-08-22 ENCOUNTER — Telehealth: Payer: Self-pay | Admitting: Internal Medicine

## 2014-08-22 NOTE — Telephone Encounter (Signed)
Patient received last lab work.  She would like a call in regards to if Cholesterol meds will be changed in order to stop cramping in legs.

## 2014-08-24 NOTE — Telephone Encounter (Signed)
Stop the cholesterol meds for now and let's recheck in 2-3 months

## 2014-08-25 NOTE — Telephone Encounter (Signed)
Pt.notified

## 2014-09-08 ENCOUNTER — Telehealth: Payer: Self-pay | Admitting: Internal Medicine

## 2014-09-08 NOTE — Telephone Encounter (Signed)
Follow up with Z Tamala Julian

## 2014-09-08 NOTE — Telephone Encounter (Signed)
Pt still having pain both calfs, thighs, low back pain. Pt states she has ultrasounds of legs, no clots. Pt feels like she is living on pain meds, and would prefer getting off them. She wonders what the next step would be, CT? Please advise.

## 2014-09-08 NOTE — Telephone Encounter (Signed)
Called pt with md response. Made appt for tomorrow with Dr. Tamala Julian...Holly [name]

## 2014-09-09 ENCOUNTER — Ambulatory Visit (INDEPENDENT_AMBULATORY_CARE_PROVIDER_SITE_OTHER): Payer: Medicare PPO | Admitting: Family Medicine

## 2014-09-09 ENCOUNTER — Ambulatory Visit (HOSPITAL_COMMUNITY)
Admission: RE | Admit: 2014-09-09 | Discharge: 2014-09-09 | Disposition: A | Discharge status: Home / Self Care | Payer: Medicare PPO | Source: Ambulatory Visit | Attending: Family Medicine | Admitting: Family Medicine

## 2014-09-09 ENCOUNTER — Encounter: Payer: Self-pay | Admitting: Family Medicine

## 2014-09-09 VITALS — BP 146/88 | HR 75 | Ht 69.5 in | Wt 319.0 lb

## 2014-09-09 DIAGNOSIS — M79604 Pain in right leg: Secondary | ICD-10-CM | POA: Diagnosis not present

## 2014-09-09 DIAGNOSIS — M5416 Radiculopathy, lumbar region: Secondary | ICD-10-CM | POA: Diagnosis present

## 2014-09-09 DIAGNOSIS — M79605 Pain in left leg: Secondary | ICD-10-CM | POA: Diagnosis not present

## 2014-09-09 DIAGNOSIS — M1711 Unilateral primary osteoarthritis, right knee: Secondary | ICD-10-CM

## 2014-09-09 MED ORDER — GABAPENTIN 100 MG PO CAPS: 200.0000 mg | ORAL_CAPSULE | Freq: Every day | ORAL | Status: DC

## 2014-09-09 MED ORDER — PREDNISONE 50 MG PO TABS: 50.0000 mg | ORAL_TABLET | Freq: Every day | ORAL | Status: DC

## 2014-09-09 NOTE — Assessment & Plan Note (Addendum)
Patient's pain seemed to be secondary to morbid calf tenderness bilaterally. Patient not making any improvement with electrolyte replacement and patient's knee now improved I'm concerned the patient is having more of a lumbar radiculopathy. We will treat her for an exacerbation with gabapentin at night and prednisone during the day. Home exercise program was given another conservative therapies were suggested. Patient will get x-rays today to further evaluate for any underlying pathology. Patient does not make any significant improvements over the course of the next week advance imaging may be necessary. Would consider an MRI of the lumbar spine. We'll discuss at follow-up. Warned patient while she is on prednisone to check her blood sugars one extra time daily for the next 5 days. Spent greater than 25 minutes with patient face-to-face and had greater than 50% of counseling including as described above in assessment and plan.

## 2014-09-09 NOTE — Patient Instructions (Addendum)
Good to see you We will look at your back.  Prednisone 5 days.  Gabapentin at night 100mg  for first week then 200mg  second week.  Ice is your friend Exercises most days of the week.  Call in 1 week and if not better we will get MRI.  If we get MRI then I want to see you in 1-2 days after and we will go over the results.

## 2014-09-09 NOTE — Progress Notes (Signed)
CC: Right knee pain  HPI: Patient is a very pleasant 73 year old female coming in with right knee pain. Patient had failed conservative therapy and did undergo a Synvisc supplementation. Patient finish this series one month ago. Patient states that her knee is feeling significantly better..  Patient states that she is having worsening back pain. Patient states that it is so severe it is waking her up at night and stopping her from activities. Patient unfortunately continues to walk with the aid of a cane secondary to the pain. Radiation to calf.  MAybe new weakness she states. Patient is accompanied with son who states that she is doing less activity and is becoming weak on her feet. Patient states that pain medication at this time is a limiting that seems to help with some of the pain. Patient has been given exercises before and has not been doing them on a quite of his regular basis. Denies any fevers or chills or any abnormal weight loss. Patient describes the pain in her calf is more of a burning sensation. Standing for a sharp pain that radiates down the posterior aspect of her leg down to her foot if she moves improperly  Past medical, surgical, family and social history reviewed. Medications reviewed all in the electronic medical record.   Review of Systems: No headache, visual changes, nausea, vomiting, diarrhea, constipation, dizziness, abdominal pain, skin rash, fevers, chills, night sweats, weight loss, swollen lymph nodes, body aches, joint swelling, muscle aches, chest pain, shortness of breath, mood changes.   Objective:    Height 5' 9.5" (1.765 m), weight 319 lb (144.697 kg).   General: No apparent distress alert and oriented x3 mood and affect normal, dressed appropriately.  HEENT: Pupils equal, extraocular movements intact Respiratory: Patient's speak in full sentences and does not appear short of breath Cardiovascular: No lower extremity edema, non tender, no erythema Skin: Warm  dry intact with no signs of infection or rash on extremities or on axial skeleton. Abdomen: Soft nontender Neuro: Cranial nerves II through XII are intact, neurovascularly intact in all extremities with 2+ DTRs and 2+ pulses. Lymph: No lymphadenopathy of posterior or anterior cervical chain or axillae bilaterally.  Gait this patient with an antalgic gait walking with a cane keeping the foot in dorsiflexion on the right side. MSK: Non tender with full range of motion and good stability and symmetric strength and tone of shoulders, elbows, wrist and ankles bilaterally.  Knee: Right Normal to inspection with no erythema or effusion or obvious bony abnormalities. Palpation normal with no warmth, joint line tenderness, patellar tenderness, or condyle tenderness. ROM full in flexion and extension and lower leg rotation. Ligaments with solid consistent endpoints including ACL, PCL, LCL, MCL. Negative Mcmurray's, Apley's, and Thessalonian tests. Non painful patellar compression. Patellar glide without crepitus. Patellar and quadriceps tendons unremarkable. Hamstring and quadriceps strength is normal.  Back Exam:  Inspection: Unremarkable but difficult to assess secondary to patient's body habitus Motion: Flexion 45 deg, Extension 35 deg, Side Bending to 45 deg bilaterally,  Rotation to 45 deg bilaterally  SLR laying: Positive right XSLR laying: Negative  Palpable tenderness: Tender over the right paraspinal musculature FABER: Unable to do secondary to pain Sensory change: Gross sensation intact to all lumbar and sacral dermatomes.  Reflexes: 2+ at both patellar tendons, 2+ at achilles tendons, Babinski's downgoing.  Strength at foot  Plantar-flexion: 4/5 Dorsi-flexion: 5/5 Eversion: 4/5 Inversion: 4/5  Leg strength  Quad: 5/5 Hamstring: 5/5 Hip flexor: 5/5 Hip abductors: 4/5     .  Impression and Recommendations:     This case required medical decision making of moderate complexity.

## 2014-09-09 NOTE — Assessment & Plan Note (Signed)
Patient has done remarkable well with the Synvisc injections. Patient knows we can do this with 6 week intervals without any significant discomfort. Patient will continue the home exercises, icing, and bracing.

## 2014-09-15 ENCOUNTER — Telehealth: Payer: Self-pay | Admitting: Internal Medicine

## 2014-09-15 ENCOUNTER — Encounter: Payer: Self-pay | Admitting: Family Medicine

## 2014-09-15 DIAGNOSIS — M5416 Radiculopathy, lumbar region: Secondary | ICD-10-CM

## 2014-09-15 NOTE — Telephone Encounter (Signed)
Ria Comment, can you get the order in and tell her she will need to call and make appointment once MRI is schedule.d  MRI of lumbar spine Thank you.

## 2014-09-15 NOTE — Telephone Encounter (Signed)
Pt states if she was not feeling better to call and tell schedulers to relay the message to Dr. Tamala Julian that she wanted to go through with the MRI/-pt requesting referral and appt be scheduled Please advise.

## 2014-09-15 NOTE — Telephone Encounter (Signed)
Order entered, pt made aware.  

## 2014-09-23 ENCOUNTER — Other Ambulatory Visit: Payer: Medicare PPO

## 2014-09-28 ENCOUNTER — Ambulatory Visit
Admission: RE | Admit: 2014-09-28 | Discharge: 2014-09-28 | Disposition: A | Discharge status: Home / Self Care | Payer: Medicare PPO | Source: Ambulatory Visit | Attending: Family Medicine | Admitting: Family Medicine

## 2014-09-28 DIAGNOSIS — M5416 Radiculopathy, lumbar region: Secondary | ICD-10-CM

## 2014-09-29 ENCOUNTER — Telehealth: Payer: Self-pay | Admitting: Internal Medicine

## 2014-09-29 NOTE — Telephone Encounter (Signed)
I would like to discuss with her if she can.  Please call her back and get her set up when you can.  Thank you!

## 2014-09-29 NOTE — Telephone Encounter (Signed)
Pt had MRI 11/15, as ordered by Dr Tamala Julian per pt. Pt wants to know if she needs to set up appt with Dr Tamala Julian go review results this week? Please advise.

## 2014-10-03 ENCOUNTER — Ambulatory Visit (INDEPENDENT_AMBULATORY_CARE_PROVIDER_SITE_OTHER): Payer: Medicare PPO | Admitting: Family Medicine

## 2014-10-03 VITALS — BP 146/84 | HR 83

## 2014-10-03 DIAGNOSIS — M5416 Radiculopathy, lumbar region: Secondary | ICD-10-CM

## 2014-10-03 DIAGNOSIS — M4806 Spinal stenosis, lumbar region: Secondary | ICD-10-CM

## 2014-10-03 DIAGNOSIS — M48061 Spinal stenosis, lumbar region without neurogenic claudication: Secondary | ICD-10-CM

## 2014-10-03 NOTE — Patient Instructions (Signed)
You have spinal stenosis and bulging discs at L4 and L5  I would recommend an epidural first to diagnose and hopefully help  If the epidural helps we can do up to 3 in a calendar year.  If it only helps a little bit then would consider referral to a surgeon.  Come back 1-2 weeks after epidural and we will discuss.

## 2014-10-04 ENCOUNTER — Encounter: Payer: Self-pay | Admitting: Family Medicine

## 2014-10-04 NOTE — Assessment & Plan Note (Signed)
Based on patient's symptoms and MRI report I do think the patient is having lumbar radiculopathy that is causing the significant amount of pain as well as weakness of her lower extremities. Patient was given multiple different suggestions including formal physical therapy for another round which patient declined. Patient isn't so much pain that I do think that an epidural steroid injection could be helpful diagnostically as well as therapeutically. The patient's symptoms being more on the posterior lateral aspect the leg. I think that this is more of an L5 or S1 nerve root impingement. We will do an epidural steroid injection at L4-L5 for patient has a moderate stenosis. We may need to consider an L5-S1 right-sided facet injection if patient does not respond significantly. Patient does respond we discussed with her that we can do 3 within a year. Patient's has response short-term but continues to have weakness I would consider a neurosurgical consult. Patient will continue with conservative therapy and see me 1-2 weeks after the epidural to discuss further treatment options.  Spent greater than 25 minutes with patient face-to-face and had greater than 50% of counseling including as described above in assessment and plan.

## 2014-10-04 NOTE — Progress Notes (Signed)
  CC: LBP  HPI: Patient is here for follow-up of her lumbar radiculopathy. Patient was seen previously and was having radicular symptoms down both legs right greater than left, as well as weakness in the lower extremities. Patient is now ambulating with the aid of a cane due to the weakness. Patient is also waking up at night. Patient is needing to use her pain medication 2-3 times daily. Patient states it is becoming more difficult to do activities of daily living. Patient was sent for an MRI and is here to discuss findings.  Patient's MRI was reviewed by me. MRI of the lumbar spine shows a grade 1 anterior listhesis of L4 on L5 with moderate spinal stenosis as well as moderate disc bulging with moderate to severe right and moderate left lateral recess stenosis and moderate bilateral neural foraminal stenosis especially on the S1 nerve root on the right side.  Past medical, surgical, family and social history reviewed. Medications reviewed all in the electronic medical record.   Review of Systems: No headache, visual changes, nausea, vomiting, diarrhea, constipation, dizziness, abdominal pain, skin rash, fevers, chills, night sweats, weight loss, swollen lymph nodes, body aches, joint swelling, muscle aches, chest pain, shortness of breath, mood changes.   Objective:    Blood pressure 146/84, pulse 83, SpO2 97 %.   General: No apparent distress alert and oriented x3 mood and affect normal, dressed appropriately.  HEENT: Pupils equal, extraocular movements intact Respiratory: Patient's speak in full sentences and does not appear short of breath Cardiovascular: No lower extremity edema, non tender, no erythema Skin: Warm dry intact with no signs of infection or rash on extremities or on axial skeleton. Abdomen: Soft nontender Neuro: Cranial nerves II through XII are intact, neurovascularly intact in all extremities with 2+ DTRs and 2+ pulses. Lymph: No lymphadenopathy of posterior or anterior  cervical chain or axillae bilaterally.  Gait this patient with an antalgic gait walking with a cane keeping the foot in dorsiflexion on the right side. MSK: Non tender with full range of motion and good stability and symmetric strength and tone of shoulders, elbows, wrist and ankles bilaterally.  Knee: Right Normal to inspection with no erythema or effusion or obvious bony abnormalities. Palpation normal with no warmth, joint line tenderness, patellar tenderness, or condyle tenderness. ROM full in flexion and extension and lower leg rotation. Ligaments with solid consistent endpoints including ACL, PCL, LCL, MCL. Negative Mcmurray's, Apley's, and Thessalonian tests. Non painful patellar compression. Patellar glide without crepitus. Patellar and quadriceps tendons unremarkable. Hamstring and quadriceps strength is normal.  Back Exam:  Inspection: Unremarkable but difficult to assess secondary to patient's body habitus Motion: Flexion 45 deg, Extension 35 deg, Side Bending to 45 deg bilaterally,  Rotation to 45 deg bilaterally  SLR laying: Positive right XSLR laying: Positive Palpable tenderness: Tender over the right paraspinal musculature of the lumbar spine FABER: Unable to do secondary to pain Sensory change: Gross sensation intact to all lumbar and sacral dermatomes.  Reflexes: 2+ at both patellar tendons, 2+ at achilles tendons, Babinski's downgoing.  Strength at foot  Plantar-flexion: 4/5 Dorsi-flexion: 5/5 Eversion: 4/5 Inversion: 4/5  Leg strength  Quad: 5/5 Hamstring: 5/5 Hip flexor: 5/5 Hip abductors: 4/5  Mild worsening on exam today.   .  Impression and Recommendations:     This case required medical decision making of moderate complexity.

## 2014-10-30 ENCOUNTER — Other Ambulatory Visit: Payer: Self-pay | Admitting: Family Medicine

## 2014-10-30 ENCOUNTER — Ambulatory Visit
Admission: RE | Admit: 2014-10-30 | Discharge: 2014-10-30 | Disposition: A | Discharge status: Home / Self Care | Payer: Medicare PPO | Source: Ambulatory Visit | Attending: Family Medicine | Admitting: Family Medicine

## 2014-10-30 DIAGNOSIS — M48061 Spinal stenosis, lumbar region without neurogenic claudication: Secondary | ICD-10-CM

## 2014-10-30 MED ORDER — IOHEXOL 180 MG/ML  SOLN
1.0000 mL | Freq: Once | INTRAMUSCULAR | Status: AC | PRN
  Administered 2014-10-30: 1 mL via EPIDURAL

## 2014-10-30 MED ORDER — METHYLPREDNISOLONE ACETATE 40 MG/ML INJ SUSP (RADIOLOG
120.0000 mg | Freq: Once | INTRAMUSCULAR | Status: AC
  Administered 2014-10-30: 120 mg via EPIDURAL

## 2014-10-30 NOTE — Discharge Instructions (Signed)

## 2014-11-10 ENCOUNTER — Ambulatory Visit (INDEPENDENT_AMBULATORY_CARE_PROVIDER_SITE_OTHER): Payer: Medicare PPO | Admitting: Internal Medicine

## 2014-11-10 ENCOUNTER — Encounter: Payer: Self-pay | Admitting: Internal Medicine

## 2014-11-10 ENCOUNTER — Other Ambulatory Visit (INDEPENDENT_AMBULATORY_CARE_PROVIDER_SITE_OTHER): Payer: Medicare PPO

## 2014-11-10 VITALS — BP 160/100 | HR 77 | Temp 98.0°F | Resp 16 | Ht 69.5 in | Wt 314.0 lb

## 2014-11-10 DIAGNOSIS — M1710 Unilateral primary osteoarthritis, unspecified knee: Secondary | ICD-10-CM

## 2014-11-10 DIAGNOSIS — M5416 Radiculopathy, lumbar region: Secondary | ICD-10-CM

## 2014-11-10 DIAGNOSIS — I10 Essential (primary) hypertension: Secondary | ICD-10-CM

## 2014-11-10 DIAGNOSIS — E118 Type 2 diabetes mellitus with unspecified complications: Secondary | ICD-10-CM

## 2014-11-10 DIAGNOSIS — E785 Hyperlipidemia, unspecified: Secondary | ICD-10-CM

## 2014-11-10 DIAGNOSIS — M1711 Unilateral primary osteoarthritis, right knee: Secondary | ICD-10-CM

## 2014-11-10 LAB — URINALYSIS, ROUTINE W REFLEX MICROSCOPIC
Bilirubin Urine: NEGATIVE
Ketones, ur: NEGATIVE
NITRITE: NEGATIVE
PH: 5.5 (ref 5.0–8.0)
Specific Gravity, Urine: >=1.03 — AB (ref 1.000–1.030)
TOTAL PROTEIN, URINE-UPE24: 100 — AB
UROBILINOGEN UA: 0.2 (ref 0.0–1.0)
Urine Glucose: NEGATIVE

## 2014-11-10 LAB — CBC WITH DIFFERENTIAL/PLATELET
BASOS PCT: 0.4 % (ref 0.0–3.0)
Basophils Absolute: 0 10*3/uL (ref 0.0–0.1)
Eosinophils Absolute: 0.2 10*3/uL (ref 0.0–0.7)
Eosinophils Relative: 2.2 % (ref 0.0–5.0)
HCT: 42.1 % (ref 36.0–46.0)
HEMOGLOBIN: 13.5 g/dL (ref 12.0–15.0)
LYMPHS PCT: 27.4 % (ref 12.0–46.0)
Lymphs Abs: 2 10*3/uL (ref 0.7–4.0)
MCHC: 32 g/dL (ref 30.0–36.0)
MCV: 84.1 fl (ref 78.0–100.0)
Monocytes Absolute: 0.6 10*3/uL (ref 0.1–1.0)
Monocytes Relative: 7.8 % (ref 3.0–12.0)
NEUTROS ABS: 4.6 10*3/uL (ref 1.4–7.7)
Neutrophils Relative %: 62.2 % (ref 43.0–77.0)
Platelets: 256 10*3/uL (ref 150.0–400.0)
RBC: 5.01 Mil/uL (ref 3.87–5.11)
RDW: 14.5 % (ref 11.5–15.5)
WBC: 7.5 10*3/uL (ref 4.0–10.5)

## 2014-11-10 LAB — BASIC METABOLIC PANEL
BUN: 20 mg/dL (ref 6–23)
CALCIUM: 10 mg/dL (ref 8.4–10.5)
CO2: 29 mEq/L (ref 19–32)
CREATININE: 0.9 mg/dL (ref 0.4–1.2)
Chloride: 101 mEq/L (ref 96–112)
GFR: 82.43 mL/min (ref >60.00)
GLUCOSE: 134 mg/dL — AB (ref 70–99)
Potassium: 4.1 mEq/L (ref 3.5–5.1)
Sodium: 138 mEq/L (ref 135–145)

## 2014-11-10 LAB — LIPID PANEL
Cholesterol: 257 mg/dL — ABNORMAL HIGH (ref 0–200)
HDL: 73.2 mg/dL (ref >39.00)
LDL Cholesterol: 157 mg/dL — ABNORMAL HIGH (ref 0–99)
NonHDL: 183.8
TRIGLYCERIDES: 136 mg/dL (ref 0.0–149.0)
Total CHOL/HDL Ratio: 4
VLDL: 27.2 mg/dL (ref 0.0–40.0)

## 2014-11-10 LAB — TSH: TSH: 3.07 u[IU]/mL (ref 0.35–4.50)

## 2014-11-10 LAB — HEMOGLOBIN A1C: Hgb A1c MFr Bld: 7.7 % — ABNORMAL HIGH (ref 4.6–6.5)

## 2014-11-10 LAB — MICROALBUMIN / CREATININE URINE RATIO
Creatinine,U: 177.8 mg/dL
MICROALB UR: 67.1 mg/dL — AB (ref 0.0–1.9)
Microalb Creat Ratio: 37.7 mg/g — ABNORMAL HIGH (ref 0.0–30.0)

## 2014-11-10 MED ORDER — HYDROCODONE-ACETAMINOPHEN 10-325 MG PO TABS: 1.0000 | ORAL_TABLET | Freq: Three times a day (TID) | ORAL | Status: DC | PRN

## 2014-11-10 MED ORDER — NEBIVOLOL HCL 5 MG PO TABS
5.0000 mg | ORAL_TABLET | Freq: Every day | ORAL | Status: DC
Start: 2014-11-10 — End: 2015-01-08

## 2014-11-10 NOTE — Assessment & Plan Note (Signed)
She will cont norco as needed for pain 

## 2014-11-10 NOTE — Patient Instructions (Signed)

## 2014-11-10 NOTE — Assessment & Plan Note (Signed)
I will recheck her A1C today and will treat if needed

## 2014-11-10 NOTE — Assessment & Plan Note (Signed)
Her BP is too high She will stop the nsaids and phentermine Will add bystolic to the ARB/HCTZ combo I will check her labs today to look for end organ damage and secondary causes of HTN

## 2014-11-10 NOTE — Progress Notes (Signed)
   Subjective:    Patient ID: Holly [name], female    DOB: [birth year], 73 y.o.   MRN: [id number]  Hypertension This is a chronic problem. The current episode started more than 1 year ago. The problem has been gradually worsening since onset. The problem is uncontrolled. Pertinent negatives include no anxiety, blurred vision, chest pain, headaches, malaise/fatigue, neck pain, orthopnea, palpitations, peripheral edema, PND, shortness of breath or sweats. Agents associated with hypertension include amphetamines and NSAIDs. Past treatments include angiotensin blockers and diuretics. The current treatment provides mild improvement. Compliance problems include diet and exercise.       Review of Systems  Constitutional: Negative.  Negative for fever, chills, malaise/fatigue, diaphoresis, appetite change and fatigue.  HENT: Negative.   Eyes: Negative.  Negative for blurred vision.  Respiratory: Negative.  Negative for cough, choking, chest tightness, shortness of breath, wheezing and stridor.   Cardiovascular: Negative.  Negative for chest pain, palpitations, orthopnea, leg swelling and PND.  Gastrointestinal: Negative.  Negative for nausea, vomiting, abdominal pain, diarrhea, constipation and blood in stool.  Endocrine: Negative.   Genitourinary: Negative.   Musculoskeletal: Positive for back pain and arthralgias. Negative for myalgias, joint swelling, gait problem, neck pain and neck stiffness.  Skin: Negative.  Negative for rash.  Allergic/Immunologic: Negative.   Neurological: Negative.  Negative for headaches.  Hematological: Negative.  Negative for adenopathy. Does not bruise/bleed easily.  Psychiatric/Behavioral: Negative.        Objective:   Physical Exam  Constitutional: She is oriented to person, place, and time. She appears well-developed and well-nourished. No distress.  HENT:  Head: Normocephalic and atraumatic.  Mouth/Throat: Oropharynx is clear and moist. No oropharyngeal  exudate.  Eyes: Conjunctivae are normal. Right eye exhibits no discharge. Left eye exhibits no discharge. No scleral icterus.  Neck: Normal range of motion. Neck supple. No JVD present. No tracheal deviation present. No thyromegaly present.  Cardiovascular: Normal rate, regular rhythm, normal heart sounds and intact distal pulses.  Exam reveals no gallop and no friction rub.   No murmur heard. Pulmonary/Chest: Effort normal and breath sounds normal. No stridor. No respiratory distress. She has no wheezes. She has no rales. She exhibits no tenderness.  Abdominal: Soft. Bowel sounds are normal. She exhibits no distension and no mass. There is no tenderness. There is no rebound and no guarding.  Musculoskeletal: Normal range of motion. She exhibits no edema or tenderness.  Lymphadenopathy:    She has no cervical adenopathy.  Neurological: She is oriented to person, place, and time.  Skin: Skin is warm and dry. No rash noted. She is not diaphoretic. No erythema. No pallor.      Lab Results  Component Value Date   WBC 6.2 02/12/2010   HGB 14.1 02/12/2010   HCT 41.3 02/12/2010   PLT 237.0 02/12/2010   GLUCOSE 145* 08/18/2014   CHOL 188 11/05/2013   TRIG 169.0* 11/05/2013   HDL 59.70 11/05/2013   LDLDIRECT 134.8 08/27/2010   LDLCALC 95 11/05/2013   ALT 19 11/05/2013   AST 17 11/05/2013   NA 133* 08/18/2014   K 3.5 08/18/2014   CL 100 08/18/2014   CREATININE 1.0 08/18/2014   BUN 26* 08/18/2014   CO2 26 08/18/2014   TSH 3.41 05/06/2013   HGBA1C 7.0* 07/11/2014      Assessment & Plan:

## 2014-11-10 NOTE — Assessment & Plan Note (Signed)
She has had too many side effects from statins so will not take one at this time I will recheck her FLP today

## 2014-11-12 ENCOUNTER — Telehealth: Payer: Self-pay | Admitting: Internal Medicine

## 2014-11-12 DIAGNOSIS — M5416 Radiculopathy, lumbar region: Secondary | ICD-10-CM

## 2014-11-12 NOTE — Telephone Encounter (Signed)
Order entered

## 2014-11-12 NOTE — Telephone Encounter (Signed)
I am find for that. Please place order. Will need to be at least 2 weeks between injections.

## 2014-11-12 NOTE — Telephone Encounter (Signed)
Pt requesting another epidural for her back pain, left leg cramps. [facility] needs refererral from Dr Tamala Julian for another epidural ([place] told pt). [phone]

## 2014-11-21 ENCOUNTER — Other Ambulatory Visit: Payer: Self-pay | Admitting: Family Medicine

## 2014-11-21 ENCOUNTER — Encounter: Payer: Self-pay | Admitting: Family Medicine

## 2014-11-21 ENCOUNTER — Ambulatory Visit (INDEPENDENT_AMBULATORY_CARE_PROVIDER_SITE_OTHER): Payer: Medicare PPO | Admitting: Family Medicine

## 2014-11-21 VITALS — BP 114/82 | HR 73 | Ht 69.5 in | Wt 314.0 lb

## 2014-11-21 DIAGNOSIS — M5416 Radiculopathy, lumbar region: Secondary | ICD-10-CM

## 2014-11-21 MED ORDER — GABAPENTIN 300 MG PO CAPS: ORAL_CAPSULE | ORAL | Status: DC

## 2014-11-21 NOTE — Progress Notes (Signed)
  CC: LBP  HPI: Patient is here for follow-up of her lumbar radiculopathy.  Patient's . MRI of the lumbar spine shows a grade 1 anterior listhesis of L4 on L5 with moderate spinal stenosis as well as moderate disc bulging with moderate to severe right and moderate left lateral recess stenosis and moderate bilateral neural foraminal stenosis especially on the S1 nerve root on the right side. We attended an epidural steroid injection. Patient states that the pain going down her right leg did improve as well as some of the back pain. Patient unfortunately continues to have the radiculopathy down the left leg. Patient states it is so severe that she continues to have to use pain medications on a regular basis and does not feel that the gabapentin is working.   Past medical, surgical, family and social history reviewed. Medications reviewed all in the electronic medical record.   Review of Systems: No headache, visual changes, nausea, vomiting, diarrhea, constipation, dizziness, abdominal pain, skin rash, fevers, chills, night sweats, weight loss, swollen lymph nodes, body aches, joint swelling, muscle aches, chest pain, shortness of breath, mood changes.   Objective:    Blood pressure 114/82, pulse 73, height 5' 9.5" (1.765 m), weight 314 lb (142.429 kg), SpO2 95 %.   General: No apparent distress alert and oriented x3 mood and affect normal, dressed appropriately.  HEENT: Pupils equal, extraocular movements intact Respiratory: Patient's speak in full sentences and does not appear short of breath Cardiovascular: No lower extremity edema, non tender, no erythema Skin: Warm dry intact with no signs of infection or rash on extremities or on axial skeleton. Abdomen: Soft nontender Neuro: Cranial nerves II through XII are intact, neurovascularly intact in all extremities with 2+ DTRs and 2+ pulses. Lymph: No lymphadenopathy of posterior or anterior cervical chain or axillae bilaterally.  Gait this  patient with an antalgic gait walking with a cane keeping the foot in dorsiflexion on the right side. MSK: Non tender with full range of motion and good stability and symmetric strength and tone of shoulders, elbows, wrist and ankles bilaterally.  Back Exam:  Inspection: Unremarkable but difficult to assess secondary to patient's body habitus Motion: Flexion 35 deg, Extension 35 deg, Side Bending to 35 deg bilaterally,  Rotation to 45 deg bilaterally  SLR laying: Positive left XSLR laying: Positive bilateral Palpable tenderness: Tender over the right paraspinal musculature of the lumbar spine diffusely.  FABER: Unable to do secondary to pain Sensory change: Gross sensation intact to all lumbar and sacral dermatomes.  Reflexes: 2+ at both patellar tendons, 2+ at achilles tendons, Babinski's downgoing.  Strength at foot  Plantar-flexion: 4/5 Dorsi-flexion: 5/5 Eversion: 4/5 Inversion: 4/5  Leg strength  Quad: 5/5 Hamstring: 5/5 Hip flexor: 5/5 Hip abductors: 4/5  Worsening SLT.   .  Impression and Recommendations:     This case required medical decision making of moderate complexity.

## 2014-11-21 NOTE — Patient Instructions (Signed)
It is good to see you Increase gabapentin to 300mg  at night.  Continue the pain meds We are going to try 2 injections this time.   I want to see you 1 week after.

## 2014-11-21 NOTE — Assessment & Plan Note (Signed)
did have some benefit for injection.  At this point I think patient's epidural did help more of the spinal listhesis does causing her pain. Patient though I do think that the facet and the foraminal narrowing is likely given her some discomfort. Because his been greater than 2 months we can try another epidural steroid injection and I would like to add a facet injection. After the L5 and S1 facet which I think will be beneficial in the left side. Discussed with patient at this time hopefully this will be helpful. Otherwise we have exhausted all other conservative therapy and patient may need to consider surgical intervention. Patient is in enough pain that she would state she would want to hear her options. Patient's questions were all answered. Patient will continue conservative therapy and we went over the home exercises again in great detail.  Spent  25 minutes with patient face-to-face and had greater than 50% of counseling including as described above in assessment and plan.

## 2014-11-21 NOTE — Addendum Note (Signed)
Addended by: Douglass Rivers T on: 11/21/2014 09:38 AM   Modules accepted: Orders

## 2014-12-01 ENCOUNTER — Ambulatory Visit
Admission: RE | Admit: 2014-12-01 | Discharge: 2014-12-01 | Disposition: A | Discharge status: Home / Self Care | Payer: Medicare PPO | Source: Ambulatory Visit | Attending: Family Medicine | Admitting: Family Medicine

## 2014-12-01 DIAGNOSIS — M5416 Radiculopathy, lumbar region: Secondary | ICD-10-CM

## 2014-12-01 MED ORDER — METHYLPREDNISOLONE ACETATE 40 MG/ML INJ SUSP (RADIOLOG
120.0000 mg | Freq: Once | INTRAMUSCULAR | Status: AC
  Administered 2014-12-01: 120 mg via EPIDURAL

## 2014-12-01 MED ORDER — IOHEXOL 180 MG/ML  SOLN
1.0000 mL | Freq: Once | INTRAMUSCULAR | Status: AC | PRN
  Administered 2014-12-01: 1 mL via EPIDURAL

## 2014-12-01 NOTE — Discharge Instructions (Signed)

## 2014-12-16 ENCOUNTER — Ambulatory Visit (INDEPENDENT_AMBULATORY_CARE_PROVIDER_SITE_OTHER): Payer: Medicare PPO | Admitting: Family Medicine

## 2014-12-16 ENCOUNTER — Encounter: Payer: Self-pay | Admitting: Family Medicine

## 2014-12-16 VITALS — BP 142/90 | HR 72 | Ht 69.5 in | Wt 311.0 lb

## 2014-12-16 DIAGNOSIS — M48061 Spinal stenosis, lumbar region without neurogenic claudication: Secondary | ICD-10-CM

## 2014-12-16 DIAGNOSIS — M4806 Spinal stenosis, lumbar region: Secondary | ICD-10-CM

## 2014-12-16 DIAGNOSIS — M5416 Radiculopathy, lumbar region: Secondary | ICD-10-CM

## 2014-12-16 NOTE — Progress Notes (Signed)
Pre visit review using our clinic review tool, if applicable. No additional management support is needed unless otherwise documented below in the visit note. 

## 2014-12-16 NOTE — Assessment & Plan Note (Signed)
Patient has failed all other conservative therapy at this time including formal physical therapy, chiropractic, natural supple mentation's, medications as well as neuromodulator's, and did have epidural steroid injection as well as facet injection with mild to moderate decrease in pain but complete resolution of the radiculopathy. With these findings I do believe that patient could be a possible surgical candidate and I would like patient to be evaluated by an orthopedic surgeon for further discussion. This will allow her to know her options. Patient has been referred to Dr. Lynann Bologna. Patient knows that she has any questions or concerns she is more than welcome to contact me.  Spent  25 minutes with patient face-to-face and had greater than 50% of counseling including as described above in assessment and plan.

## 2014-12-16 NOTE — Patient Instructions (Signed)
Good to see you I am glad you got a little better with the injection.  Ice still and continue the medicines Dr. Lynann Bologna office will be calling you [phone] [address]  Call or write me if you have any questions.

## 2014-12-16 NOTE — Progress Notes (Signed)
  CC: LBP follow up  HPI: Patient is here for follow-up of her lumbar radiculopathy.  Patient's . MRI of the lumbar spine shows a grade 1 anterior listhesis of L4 on L5 with moderate spinal stenosis as well as moderate disc bulging with moderate to severe right and moderate left lateral recess stenosis and moderate bilateral neural foraminal stenosis especially on the S1 nerve root on the right side. Had epidural with minimal; benefit.  Tried facet injection and patient states that this did help out the radicular symptoms for quite some time. Patient states that for approximately 10 day she was doing very well. Patient has started to notice some of the radicular symptoms again going down the left leg. Patient states that her leg still seemed to be getting fatigue. In addition of this patient's chronic back pain still gets her difficulty that makes her stop some activities of daily living. Patient continues to ambulate with a cane.    Past medical, surgical, family and social history reviewed. Medications reviewed all in the electronic medical record.   Review of Systems: No headache, visual changes, nausea, vomiting, diarrhea, constipation, dizziness, abdominal pain, skin rash, fevers, chills, night sweats, weight loss, swollen lymph nodes, body aches, joint swelling, muscle aches, chest pain, shortness of breath, mood changes.   Objective:    Blood pressure 142/90, pulse 72, height 5' 9.5" (1.765 m), weight 311 lb (141.069 kg), SpO2 95 %.   General: No apparent distress alert and oriented x3 mood and affect normal, dressed appropriately.  HEENT: Pupils equal, extraocular movements intact Respiratory: Patient's speak in full sentences and does not appear short of breath Cardiovascular: No lower extremity edema, non tender, no erythema Skin: Warm dry intact with no signs of infection or rash on extremities or on axial skeleton. Abdomen: Soft nontender Neuro: Cranial nerves II through XII are  intact, neurovascularly intact in all extremities with 2+ DTRs and 2+ pulses. Lymph: No lymphadenopathy of posterior or anterior cervical chain or axillae bilaterally.  Gait this patient with an antalgic gait walking with a cane keeping the foot in dorsiflexion on the right side. MSK: Non tender with full range of motion and good stability and symmetric strength and tone of shoulders, elbows, wrist and ankles bilaterally.  Back Exam:  Inspection: Unremarkable but difficult to assess secondary to patient's body habitus Motion: Flexion 35 deg, Extension 35 deg, Side Bending to 35 deg bilaterally,  Rotation to 40 deg bilaterally  SLR laying: Positive left still present XSLR laying: Positive bilateral Palpable tenderness: Continued tenderness of the paraspinal musculature of the lumbar spine left greater than right Sensory change: Gross sensation intact to all lumbar and sacral dermatomes.  Reflexes: 2+ at both patellar tendons, 2+ at achilles tendons, Babinski's downgoing.  Strength at foot  Plantar-flexion: 4/5 Dorsi-flexion: 5/5 Eversion: 4/5 Inversion: 4/5  Leg strength  Quad: 5/5 Hamstring: 5/5 Hip flexor: 4/5 Hip abductors: 4/5     .  Impression and Recommendations:     This case required medical decision making of moderate complexity.

## 2014-12-30 ENCOUNTER — Ambulatory Visit: Payer: Medicare PPO | Admitting: Internal Medicine

## 2015-01-08 ENCOUNTER — Encounter: Payer: Self-pay | Admitting: Internal Medicine

## 2015-01-08 ENCOUNTER — Ambulatory Visit (INDEPENDENT_AMBULATORY_CARE_PROVIDER_SITE_OTHER): Payer: Medicare PPO | Admitting: Internal Medicine

## 2015-01-08 VITALS — BP 160/98 | HR 84 | Temp 97.3°F | Resp 16 | Ht 69.5 in | Wt 315.0 lb

## 2015-01-08 DIAGNOSIS — N183 Chronic kidney disease, stage 3 unspecified: Secondary | ICD-10-CM

## 2015-01-08 DIAGNOSIS — E118 Type 2 diabetes mellitus with unspecified complications: Secondary | ICD-10-CM

## 2015-01-08 DIAGNOSIS — M1711 Unilateral primary osteoarthritis, right knee: Secondary | ICD-10-CM

## 2015-01-08 DIAGNOSIS — M1710 Unilateral primary osteoarthritis, unspecified knee: Secondary | ICD-10-CM

## 2015-01-08 DIAGNOSIS — M5416 Radiculopathy, lumbar region: Secondary | ICD-10-CM

## 2015-01-08 DIAGNOSIS — I10 Essential (primary) hypertension: Secondary | ICD-10-CM

## 2015-01-08 MED ORDER — NEBIVOLOL HCL 10 MG PO TABS: 10.0000 mg | ORAL_TABLET | Freq: Every day | ORAL | Status: DC

## 2015-01-08 MED ORDER — HYDROCODONE-ACETAMINOPHEN 10-325 MG PO TABS: 1.0000 | ORAL_TABLET | Freq: Three times a day (TID) | ORAL | Status: DC | PRN

## 2015-01-08 NOTE — Progress Notes (Signed)
Subjective:    Patient ID: Holly [name], female    DOB: 03-05-1941, 74 y.o.   MRN: [id number]  Hypertension This is a chronic problem. The current episode started more than 1 year ago. The problem is unchanged. The problem is uncontrolled. Pertinent negatives include no anxiety, blurred vision, chest pain, headaches, malaise/fatigue, neck pain, orthopnea, palpitations, peripheral edema, PND, shortness of breath or sweats. Past treatments include ACE inhibitors, beta blockers and diuretics. The current treatment provides mild improvement. Compliance problems include diet, exercise and psychosocial issues (she has not taken bystolic for 3 days).   Diabetes Pertinent negatives for hypoglycemia include no headaches or sweats. Pertinent negatives for diabetes include no blurred vision, no chest pain, no fatigue, no polydipsia, no polyphagia and no polyuria.  Hyperlipidemia Pertinent negatives include no chest pain or shortness of breath.      Review of Systems  Constitutional: Negative for fever, chills, malaise/fatigue, diaphoresis, appetite change and fatigue.  HENT: Negative.   Eyes: Negative.  Negative for blurred vision.  Respiratory: Negative.  Negative for cough, choking, chest tightness, shortness of breath and stridor.   Cardiovascular: Negative.  Negative for chest pain, palpitations, orthopnea, leg swelling and PND.  Gastrointestinal: Negative.  Negative for nausea, vomiting, abdominal pain, diarrhea, constipation and blood in stool.  Endocrine: Negative.  Negative for polydipsia, polyphagia and polyuria.  Genitourinary: Negative.  Negative for difficulty urinating.  Musculoskeletal: Positive for back pain and arthralgias. Negative for neck pain.  Skin: Negative.  Negative for rash.  Allergic/Immunologic: Negative.   Neurological: Negative.  Negative for headaches.  Hematological: Negative.  Negative for adenopathy. Does not bruise/bleed easily.  Psychiatric/Behavioral:  Negative.        Objective:   Physical Exam  Constitutional: She is oriented to person, place, and time. She appears well-developed and well-nourished. No distress.  HENT:  Head: Normocephalic and atraumatic.  Mouth/Throat: Oropharynx is clear and moist. No oropharyngeal exudate.  Eyes: Conjunctivae are normal. Right eye exhibits no discharge. Left eye exhibits no discharge. No scleral icterus.  Neck: Normal range of motion. Neck supple. No JVD present. No tracheal deviation present. No thyromegaly present.  Cardiovascular: Normal rate, regular rhythm, normal heart sounds and intact distal pulses.  Exam reveals no gallop and no friction rub.   No murmur heard. Pulmonary/Chest: Effort normal and breath sounds normal. No stridor. No respiratory distress. She has no wheezes. She has no rales. She exhibits no tenderness.  Abdominal: Soft. Bowel sounds are normal. She exhibits no distension and no mass. There is no tenderness. There is no rebound and no guarding.  Musculoskeletal: Normal range of motion. She exhibits no edema or tenderness.  Lymphadenopathy:    She has no cervical adenopathy.  Neurological: She is oriented to person, place, and time.  Skin: Skin is warm and dry. No rash noted. She is not diaphoretic. No erythema. No pallor.  Vitals reviewed.   Lab Results  Component Value Date   WBC 7.5 11/10/2014   HGB 13.5 11/10/2014   HCT 42.1 11/10/2014   PLT 256.0 11/10/2014   GLUCOSE 134* 11/10/2014   CHOL 257* 11/10/2014   TRIG 136.0 11/10/2014   HDL 73.20 11/10/2014   LDLDIRECT 134.8 08/27/2010   LDLCALC 157* 11/10/2014   ALT 19 11/05/2013   AST 17 11/05/2013   NA 138 11/10/2014   K 4.1 11/10/2014   CL 101 11/10/2014   CREATININE 0.9 11/10/2014   BUN 20 11/10/2014   CO2 29 11/10/2014   TSH 3.07 11/10/2014  HGBA1C 7.7* 11/10/2014   MICROALBUR 67.1* 11/10/2014        Assessment & Plan:

## 2015-01-08 NOTE — Assessment & Plan Note (Signed)
Her blood sugars are relatively well controlled She is not willing to make any changes in her meds at this time She does agree to work on her lifestyle modifications

## 2015-01-08 NOTE — Assessment & Plan Note (Signed)
Her renal function is stable/improved Will try to get better BP and BS control She will avoid nsaids

## 2015-01-08 NOTE — Assessment & Plan Note (Signed)
She will cont norco as needed for pain 

## 2015-01-08 NOTE — Progress Notes (Signed)
Pre visit review using our clinic review tool, if applicable. No additional management support is needed unless otherwise documented below in the visit note. 

## 2015-01-08 NOTE — Patient Instructions (Signed)

## 2015-01-08 NOTE — Assessment & Plan Note (Signed)
Her BP is not well controlled, partly due to non-compliance Will restart bystolic at the 10 mg per day dose Will cont the other meds She agrees to work on her lifestyle modifications

## 2015-04-02 LAB — HM DIABETES EYE EXAM

## 2015-04-20 ENCOUNTER — Encounter: Payer: Self-pay | Admitting: Internal Medicine

## 2015-04-20 ENCOUNTER — Ambulatory Visit (INDEPENDENT_AMBULATORY_CARE_PROVIDER_SITE_OTHER): Payer: Medicare PPO | Admitting: Internal Medicine

## 2015-04-20 ENCOUNTER — Other Ambulatory Visit (INDEPENDENT_AMBULATORY_CARE_PROVIDER_SITE_OTHER): Payer: Medicare PPO

## 2015-04-20 VITALS — BP 118/82 | HR 67 | Temp 97.9°F | Resp 16 | Ht 69.5 in | Wt 316.0 lb

## 2015-04-20 DIAGNOSIS — I10 Essential (primary) hypertension: Secondary | ICD-10-CM

## 2015-04-20 DIAGNOSIS — N183 Chronic kidney disease, stage 3 unspecified: Secondary | ICD-10-CM

## 2015-04-20 DIAGNOSIS — E118 Type 2 diabetes mellitus with unspecified complications: Secondary | ICD-10-CM

## 2015-04-20 DIAGNOSIS — E785 Hyperlipidemia, unspecified: Secondary | ICD-10-CM

## 2015-04-20 DIAGNOSIS — I70219 Atherosclerosis of native arteries of extremities with intermittent claudication, unspecified extremity: Secondary | ICD-10-CM | POA: Diagnosis not present

## 2015-04-20 LAB — LIPID PANEL
CHOL/HDL RATIO: 3
CHOLESTEROL: 205 mg/dL — AB (ref 0–200)
HDL: 73.9 mg/dL (ref >39.00)
LDL Cholesterol: 106 mg/dL — ABNORMAL HIGH (ref 0–99)
NONHDL: 131.1
TRIGLYCERIDES: 128 mg/dL (ref 0.0–149.0)
VLDL: 25.6 mg/dL (ref 0.0–40.0)

## 2015-04-20 LAB — BASIC METABOLIC PANEL
BUN: 18 mg/dL (ref 6–23)
CO2: 28 mEq/L (ref 19–32)
CREATININE: 0.95 mg/dL (ref 0.40–1.20)
Calcium: 9.7 mg/dL (ref 8.4–10.5)
Chloride: 101 mEq/L (ref 96–112)
GFR: 74.38 mL/min (ref >60.00)
Glucose, Bld: 144 mg/dL — ABNORMAL HIGH (ref 70–99)
Potassium: 3.9 mEq/L (ref 3.5–5.1)
SODIUM: 136 meq/L (ref 135–145)

## 2015-04-20 LAB — HEMOGLOBIN A1C: Hgb A1c MFr Bld: 6.8 % — ABNORMAL HIGH (ref 4.6–6.5)

## 2015-04-20 LAB — TSH: TSH: 3.56 u[IU]/mL (ref 0.35–4.50)

## 2015-04-20 MED ORDER — NEBIVOLOL HCL 10 MG PO TABS: 10.0000 mg | ORAL_TABLET | Freq: Every day | ORAL | Status: DC

## 2015-04-20 NOTE — Progress Notes (Signed)
Subjective:  Patient ID: Holly [name], female    DOB: Nov 28, 1940  Age: 74 y.o. MRN: [id number]  CC: Hypertension; Diabetes; and Hyperlipidemia   HPI [name] presents for follow up, she has chronic LBP but is not a candidate for surgery so she takes meds.. She offers no other complaints.  Outpatient Prescriptions Prior to Visit  Medication Sig Dispense Refill  . ACCU-CHEK FASTCLIX LANCETS MISC Use as directed to test blood sugars Dx: 250.00 102 each 12  . aspirin 81 MG EC tablet Take 81 mg by mouth daily.      . Blood Glucose Monitoring Suppl (ACCU-CHEK NANO SMARTVIEW) W/DEVICE KIT Use as directed to test blood sugars Dx: 250.00 1 kit 0  . cetirizine (ZYRTEC ALLERGY) 10 MG tablet Take 10 mg by mouth daily.      . Cholecalciferol (VITAMIN D) 400 UNIT capsule Take 400 Units by mouth daily.      . fluocinonide-emollient (LIDEX-E) 0.05 % cream Apply 1 application topically 2 (two) times daily. 60 g 1  . gabapentin (NEURONTIN) 300 MG capsule nightly (Patient taking differently: 300 mg 3 (three) times daily. nightly) 30 capsule 3  . glucose blood (ACCU-CHEK SMARTVIEW) test strip Use as instructed to test blood sugars Dx: 250.00 100 each 12  . lisinopril-hydrochlorothiazide (PRINZIDE,ZESTORETIC) 20-25 MG per tablet Take 1 tablet by mouth daily. 90 tablet 3  . metFORMIN (GLUCOPHAGE) 1000 MG tablet TAKE 1 TABLET TWICE DAILY FOR DIABETES CONTROL 180 tablet 3  . oxybutynin (DITROPAN-XL) 5 MG 24 hr tablet     . HYDROcodone-acetaminophen (NORCO) 10-325 MG per tablet Take 1 tablet by mouth every 8 (eight) hours as needed. 90 tablet 0  . nebivolol (BYSTOLIC) 10 MG tablet Take 1 tablet (10 mg total) by mouth daily. 140 tablet 0   No facility-administered medications prior to visit.    ROS Review of Systems  Constitutional: Negative for fever, chills, diaphoresis, activity change, appetite change, fatigue and unexpected weight change.  HENT: Negative.   Eyes: Negative.   Respiratory:  Negative.  Negative for cough, choking, chest tightness, shortness of breath and stridor.   Cardiovascular: Negative.  Negative for chest pain, palpitations and leg swelling.  Gastrointestinal: Negative.  Negative for nausea, vomiting, abdominal pain, diarrhea, constipation and blood in stool.  Endocrine: Negative.  Negative for polydipsia, polyphagia and polyuria.  Genitourinary: Negative.  Negative for dysuria, frequency, hematuria, enuresis and difficulty urinating.  Musculoskeletal: Positive for back pain and arthralgias. Negative for myalgias, joint swelling, gait problem, neck pain and neck stiffness.  Skin: Negative.  Negative for rash.  Allergic/Immunologic: Negative.   Neurological: Negative.  Negative for dizziness, syncope, speech difficulty, weakness, light-headedness, numbness and headaches.  Hematological: Negative.  Negative for adenopathy. Does not bruise/bleed easily.  Psychiatric/Behavioral: Negative.     Objective:  BP 118/82 mmHg  Pulse 67  Temp(Src) 97.9 F (36.6 C) (Oral)  Resp 16  Ht 5' 9.5" (1.765 m)  Wt 316 lb (143.337 kg)  BMI 46.01 kg/m2  SpO2 96%  BP Readings from Last 3 Encounters:  04/20/15 118/82  01/08/15 160/98  12/16/14 142/90    Wt Readings from Last 3 Encounters:  04/20/15 316 lb (143.337 kg)  01/08/15 315 lb (142.883 kg)  12/16/14 311 lb (141.069 kg)    Physical Exam  Constitutional: She is oriented to person, place, and time. She appears well-developed and well-nourished. No distress.  HENT:  Head: Normocephalic and atraumatic.  Mouth/Throat: Oropharynx is clear and moist. No oropharyngeal exudate.  Eyes:  Conjunctivae are normal. Right eye exhibits no discharge. Left eye exhibits no discharge. No scleral icterus.  Neck: Normal range of motion. Neck supple. No JVD present. No tracheal deviation present. No thyromegaly present.  Cardiovascular: Normal rate, regular rhythm, normal heart sounds and intact distal pulses.  Exam reveals no  gallop and no friction rub.   No murmur heard. Pulmonary/Chest: Effort normal and breath sounds normal. No stridor. No respiratory distress. She has no wheezes. She has no rales. She exhibits no tenderness.  Abdominal: Soft. Bowel sounds are normal. She exhibits no distension and no mass. There is no tenderness. There is no rebound and no guarding.  Musculoskeletal: Normal range of motion. She exhibits no edema or tenderness.  Lymphadenopathy:    She has no cervical adenopathy.  Neurological: She is oriented to person, place, and time.  Skin: Skin is warm and dry. No rash noted. She is not diaphoretic. No erythema. No pallor.  Psychiatric: She has a normal mood and affect. Her behavior is normal. Judgment and thought content normal.  Vitals reviewed.   Lab Results  Component Value Date   WBC 7.5 11/10/2014   HGB 13.5 11/10/2014   HCT 42.1 11/10/2014   PLT 256.0 11/10/2014   GLUCOSE 144* 04/20/2015   CHOL 205* 04/20/2015   TRIG 128.0 04/20/2015   HDL 73.90 04/20/2015   LDLDIRECT 134.8 08/27/2010   LDLCALC 106* 04/20/2015   ALT 19 11/05/2013   AST 17 11/05/2013   NA 136 04/20/2015   K 3.9 04/20/2015   CL 101 04/20/2015   CREATININE 0.95 04/20/2015   BUN 18 04/20/2015   CO2 28 04/20/2015   TSH 3.56 04/20/2015   HGBA1C 6.8* 04/20/2015   MICROALBUR 67.1* 11/10/2014    Dg Facet Jt Inj L /s Single Level Left W/fl/ct  12/01/2014   CLINICAL DATA:  Facet mediated lumbago. Lumbosacral spondylosis without myelopathy. LEFT-sided radicular symptoms. Improvement in RIGHT-sided symptoms after the previous lumbar epidural.  EXAM: LEFT L4-5  FACET INJECTION UNDER FLUOROSCOPY  FLUOROSCOPY TIME:  10 seconds corresponding to a Dose Area Product of 12 Gy*m2  TECHNIQUE: Informed written consent was obtained. Time-out procedure was performed.  An appropriate skin entry site was determined fluoroscopically and marked. Operator donned sterile gloves and mask. Site prepped with betadine, draped in usual  sterile fashion, and infiltrated locally with 1% lidocaine. A 22 gauge spinal needle was advanced to the posterior margin of the LEFT L4-5 facet under fluoroscopy. Diagnostic injection of 0.5 ml Omnipaque 180 confirmed intra-articular/juxta-articular spread without any intrathecal or intravascular component. 120.66m Depo-Medrol in 1 ml lidocaine 1% was administered.  IMPRESSION: 1. Technically successful LEFT L4-5 facet injection.   Electronically Signed   By: JRolla FlattenM.D.   On: 12/01/2014 14:50    Assessment & Plan:   BTammarawas seen today for hypertension, diabetes and hyperlipidemia.  Diagnoses and all orders for this visit:  Type II diabetes mellitus with manifestations - her blood sugars are well controlled, improvement nted Orders: -     Basic metabolic panel; Future -     Hemoglobin A1c; Future  Essential hypertension - her BP is well controlled, lytes and renal function are stable Orders: -     Basic metabolic panel; Future -     nebivolol (BYSTOLIC) 10 MG tablet; Take 1 tablet (10 mg total) by mouth daily.  Kidney disease, chronic, stage III (GFR 30-59 ml/min) - her renal function is stable/improved Orders: -     Basic metabolic panel; Future  Atherosclerosis  of native arteries of extremity with intermittent claudication Orders: -     Lipid panel; Future  Hyperlipidemia with target LDL less than 100 - she has achieved her LDL goal Orders: -     Lipid panel; Future -     TSH; Future   I have discontinued Ms. Renfroe's HYDROcodone-acetaminophen. I am also having her maintain her aspirin, Vitamin D, cetirizine, ACCU-CHEK NANO SMARTVIEW, glucose blood, ACCU-CHEK FASTCLIX LANCETS, oxybutynin, fluocinonide-emollient, lisinopril-hydrochlorothiazide, metFORMIN, gabapentin, meloxicam, traMADol, and nebivolol.  Meds ordered this encounter  Medications  . meloxicam (MOBIC) 7.5 MG tablet    Sig:   . traMADol (ULTRAM) 50 MG tablet    Sig:   . nebivolol (BYSTOLIC) 10 MG  tablet    Sig: Take 1 tablet (10 mg total) by mouth daily.    Dispense:  90 tablet    Refill:  3     Follow-up: Return in about 4 months (around 08/20/2015).  Scarlette Calico, MD

## 2015-04-20 NOTE — Patient Instructions (Signed)

## 2015-04-20 NOTE — Progress Notes (Signed)
Pre visit review using our clinic review tool, if applicable. No additional management support is needed unless otherwise documented below in the visit note. 

## 2015-05-09 DIAGNOSIS — E785 Hyperlipidemia, unspecified: Secondary | ICD-10-CM | POA: Diagnosis not present

## 2015-05-09 DIAGNOSIS — M5410 Radiculopathy, site unspecified: Secondary | ICD-10-CM | POA: Diagnosis not present

## 2015-05-09 DIAGNOSIS — N183 Chronic kidney disease, stage 3 (moderate): Secondary | ICD-10-CM | POA: Diagnosis not present

## 2015-05-09 DIAGNOSIS — N3281 Overactive bladder: Secondary | ICD-10-CM | POA: Diagnosis not present

## 2015-05-09 DIAGNOSIS — I129 Hypertensive chronic kidney disease with stage 1 through stage 4 chronic kidney disease, or unspecified chronic kidney disease: Secondary | ICD-10-CM | POA: Diagnosis not present

## 2015-05-09 DIAGNOSIS — E1122 Type 2 diabetes mellitus with diabetic chronic kidney disease: Secondary | ICD-10-CM | POA: Diagnosis not present

## 2015-05-09 DIAGNOSIS — M545 Low back pain: Secondary | ICD-10-CM | POA: Diagnosis not present

## 2015-05-09 DIAGNOSIS — M1711 Unilateral primary osteoarthritis, right knee: Secondary | ICD-10-CM | POA: Diagnosis not present

## 2015-05-29 DIAGNOSIS — M170 Bilateral primary osteoarthritis of knee: Secondary | ICD-10-CM | POA: Diagnosis not present

## 2015-05-29 DIAGNOSIS — M4806 Spinal stenosis, lumbar region: Secondary | ICD-10-CM | POA: Diagnosis not present

## 2015-06-17 ENCOUNTER — Telehealth: Payer: Self-pay | Admitting: Family Medicine

## 2015-06-17 NOTE — Telephone Encounter (Signed)
Patient if she can get her MRI of her pinched nerve sent to [facility], [place]   Phone Fax  Address  [phone]  [address]   [place] [address]

## 2015-06-17 NOTE — Telephone Encounter (Signed)
We can give report but need [place] [facility] if want images.

## 2015-06-18 ENCOUNTER — Ambulatory Visit (INDEPENDENT_AMBULATORY_CARE_PROVIDER_SITE_OTHER): Payer: Medicare PPO | Admitting: Internal Medicine

## 2015-06-18 ENCOUNTER — Encounter: Payer: Self-pay | Admitting: Internal Medicine

## 2015-06-18 VITALS — BP 138/82 | HR 67 | Temp 98.4°F | Resp 16 | Ht 70.0 in | Wt 320.0 lb

## 2015-06-18 DIAGNOSIS — B029 Zoster without complications: Secondary | ICD-10-CM

## 2015-06-18 MED ORDER — OXYCODONE-ACETAMINOPHEN 7.5-325 MG PO TABS: 1.0000 | ORAL_TABLET | ORAL | Status: DC | PRN

## 2015-06-18 MED ORDER — VALACYCLOVIR HCL 1 G PO TABS: 1000.0000 mg | ORAL_TABLET | Freq: Two times a day (BID) | ORAL | Status: AC

## 2015-06-18 NOTE — Telephone Encounter (Signed)
Spoke with pt during OV advised to go to medical records.

## 2015-06-18 NOTE — Progress Notes (Signed)
Subjective:  Patient ID: Holly [name], female    DOB: 09-Mar-1941  Age: 74 y.o. MRN: [id number]  CC: Rash   HPI Holly [name] presents for a 4 day history of painful rash around right back, flank and under right breast.  Outpatient Prescriptions Prior to Visit  Medication Sig Dispense Refill  . ACCU-CHEK FASTCLIX LANCETS MISC Use as directed to test blood sugars Dx: 250.00 102 each 12  . aspirin 81 MG EC tablet Take 81 mg by mouth daily.      . Blood Glucose Monitoring Suppl (ACCU-CHEK NANO SMARTVIEW) W/DEVICE KIT Use as directed to test blood sugars Dx: 250.00 1 kit 0  . cetirizine (ZYRTEC ALLERGY) 10 MG tablet Take 10 mg by mouth daily.      . Cholecalciferol (VITAMIN D) 400 UNIT capsule Take 400 Units by mouth daily.      Marland Kitchen gabapentin (NEURONTIN) 300 MG capsule nightly (Patient taking differently: 300 mg 3 (three) times daily. nightly) 30 capsule 3  . glucose blood (ACCU-CHEK SMARTVIEW) test strip Use as instructed to test blood sugars Dx: 250.00 100 each 12  . lisinopril-hydrochlorothiazide (PRINZIDE,ZESTORETIC) 20-25 MG per tablet Take 1 tablet by mouth daily. 90 tablet 3  . meloxicam (MOBIC) 7.5 MG tablet     . metFORMIN (GLUCOPHAGE) 1000 MG tablet TAKE 1 TABLET TWICE DAILY FOR DIABETES CONTROL 180 tablet 3  . nebivolol (BYSTOLIC) 10 MG tablet Take 1 tablet (10 mg total) by mouth daily. 90 tablet 3  . oxybutynin (DITROPAN-XL) 5 MG 24 hr tablet     . fluocinonide-emollient (LIDEX-E) 0.05 % cream Apply 1 application topically 2 (two) times daily. 60 g 1  . traMADol (ULTRAM) 50 MG tablet      No facility-administered medications prior to visit.    ROS Review of Systems  Constitutional: Negative.  Negative for fever, chills, diaphoresis, appetite change and fatigue.  HENT: Negative.   Eyes: Negative.   Respiratory: Negative.  Negative for cough, choking, chest tightness, shortness of breath and stridor.   Cardiovascular: Negative.  Negative for chest pain, palpitations and  leg swelling.  Gastrointestinal: Negative.  Negative for nausea, vomiting, abdominal pain, diarrhea, constipation and blood in stool.  Endocrine: Negative.   Genitourinary: Negative.   Musculoskeletal: Positive for back pain.  Skin: Positive for rash.  Allergic/Immunologic: Negative.   Neurological: Negative.   Hematological: Negative.   Psychiatric/Behavioral: Negative.     Objective:  BP 138/82 mmHg  Pulse 67  Temp(Src) 98.4 F (36.9 C) (Oral)  Resp 16  Ht _0  (1.778 m)  Wt 320 lb (145.151 kg)  BMI 45.92 kg/m2  SpO2 93%  BP Readings from Last 3 Encounters:  06/18/15 138/82  04/20/15 118/82  01/08/15 160/98    Wt Readings from Last 3 Encounters:  06/18/15 320 lb (145.151 kg)  04/20/15 316 lb (143.337 kg)  01/08/15 315 lb (142.883 kg)    Physical Exam  Constitutional: She is oriented to person, place, and time.  Non-toxic appearance. She does not have a sickly appearance. She does not appear ill. No distress.  HENT:  Mouth/Throat: Oropharynx is clear and moist. No oropharyngeal exudate.  Eyes: Conjunctivae are normal. Right eye exhibits no discharge. Left eye exhibits no discharge. No scleral icterus.  Neck: Normal range of motion. Neck supple. No JVD present. No tracheal deviation present. No thyromegaly present.  Cardiovascular: Normal rate, regular rhythm, normal heart sounds and intact distal pulses.  Exam reveals no gallop and no friction rub.   No  murmur heard. Pulmonary/Chest: Effort normal and breath sounds normal. No stridor. No respiratory distress. She has no wheezes. She has no rales.   She exhibits no mass and no tenderness.    Abdominal: Soft. Bowel sounds are normal. She exhibits no distension and no mass. There is no tenderness. There is no rebound and no guarding.  Musculoskeletal: Normal range of motion. She exhibits no edema or tenderness.  Lymphadenopathy:       Head (right side): No preauricular, no posterior auricular and no occipital  adenopathy present.       Head (left side): No preauricular, no posterior auricular and no occipital adenopathy present.    She has no cervical adenopathy.    She has no axillary adenopathy.       Right axillary: No pectoral and no lateral adenopathy present.       Left axillary: No pectoral and no lateral adenopathy present. Neurological: She is oriented to person, place, and time.  Skin: Skin is warm and dry. Rash noted. No purpura noted. Rash is vesicular. Rash is not macular, not papular, not maculopapular, not nodular, not pustular and not urticarial. She is not diaphoretic. No erythema. No pallor.  Psychiatric: She has a normal mood and affect. Her behavior is normal. Judgment and thought content normal.    Lab Results  Component Value Date   WBC 7.5 11/10/2014   HGB 13.5 11/10/2014   HCT 42.1 11/10/2014   PLT 256.0 11/10/2014   GLUCOSE 144* 04/20/2015   CHOL 205* 04/20/2015   TRIG 128.0 04/20/2015   HDL 73.90 04/20/2015   LDLDIRECT 134.8 08/27/2010   LDLCALC 106* 04/20/2015   ALT 19 11/05/2013   AST 17 11/05/2013   NA 136 04/20/2015   K 3.9 04/20/2015   CL 101 04/20/2015   CREATININE 0.95 04/20/2015   BUN 18 04/20/2015   CO2 28 04/20/2015   TSH 3.56 04/20/2015   HGBA1C 6.8* 04/20/2015   MICROALBUR 67.1* 11/10/2014    Dg Facet Jt Inj L /s Single Level Left W/fl/ct  12/01/2014   CLINICAL DATA:  Facet mediated lumbago. Lumbosacral spondylosis without myelopathy. LEFT-sided radicular symptoms. Improvement in RIGHT-sided symptoms after the previous lumbar epidural.  EXAM: LEFT L4-5  FACET INJECTION UNDER FLUOROSCOPY  FLUOROSCOPY TIME:  10 seconds corresponding to a Dose Area Product of 12 Gy*m2  TECHNIQUE: Informed written consent was obtained. Time-out procedure was performed.  An appropriate skin entry site was determined fluoroscopically and marked. Operator donned sterile gloves and mask. Site prepped with betadine, draped in usual sterile fashion, and infiltrated locally  with 1% lidocaine. A 22 gauge spinal needle was advanced to the posterior margin of the LEFT L4-5 facet under fluoroscopy. Diagnostic injection of 0.5 ml Omnipaque 180 confirmed intra-articular/juxta-articular spread without any intrathecal or intravascular component. 120.76m Depo-Medrol in 1 ml lidocaine 1% was administered.  IMPRESSION: 1. Technically successful LEFT L4-5 facet injection.   Electronically Signed   By: JRolla FlattenM.D.   On: 12/01/2014 14:50    Assessment & Plan:   BShalainewas seen today for rash.  Diagnoses and all orders for this visit:  Shingles rash- I will treat the infection with valacyclovir. Will control the pain with Percocet. When this resolves will encourage her to get a Zostavax. Orders: -     valACYclovir (VALTREX) 1000 MG tablet; Take 1 tablet (1,000 mg total) by mouth 2 (two) times daily. -     oxyCODONE-acetaminophen (PERCOCET) 7.5-325 MG per tablet; Take 1 tablet by mouth every 4 (  four) hours as needed.   I have discontinued Ms. Mantia's fluocinonide-emollient and traMADol. I am also having her start on valACYclovir and oxyCODONE-acetaminophen. Additionally, I am having her maintain her aspirin, Vitamin D, cetirizine, ACCU-CHEK NANO SMARTVIEW, glucose blood, ACCU-CHEK FASTCLIX LANCETS, oxybutynin, lisinopril-hydrochlorothiazide, metFORMIN, gabapentin, meloxicam, and nebivolol.  Meds ordered this encounter  Medications  . valACYclovir (VALTREX) 1000 MG tablet    Sig: Take 1 tablet (1,000 mg total) by mouth 2 (two) times daily.    Dispense:  20 tablet    Refill:  1  . oxyCODONE-acetaminophen (PERCOCET) 7.5-325 MG per tablet    Sig: Take 1 tablet by mouth every 4 (four) hours as needed.    Dispense:  50 tablet    Refill:  0     Follow-up: Return in about 3 weeks (around 07/09/2015).  Scarlette Calico, MD

## 2015-06-18 NOTE — Progress Notes (Signed)
Pre visit review using our clinic review tool, if applicable. No additional management support is needed unless otherwise documented below in the visit note. 

## 2015-06-18 NOTE — Patient Instructions (Signed)

## 2015-06-22 DIAGNOSIS — M5432 Sciatica, left side: Secondary | ICD-10-CM | POA: Diagnosis not present

## 2015-06-22 DIAGNOSIS — M5417 Radiculopathy, lumbosacral region: Secondary | ICD-10-CM | POA: Diagnosis not present

## 2015-06-22 DIAGNOSIS — M5415 Radiculopathy, thoracolumbar region: Secondary | ICD-10-CM | POA: Diagnosis not present

## 2015-06-22 DIAGNOSIS — M9902 Segmental and somatic dysfunction of thoracic region: Secondary | ICD-10-CM | POA: Diagnosis not present

## 2015-06-22 DIAGNOSIS — M9904 Segmental and somatic dysfunction of sacral region: Secondary | ICD-10-CM | POA: Diagnosis not present

## 2015-06-22 DIAGNOSIS — M9903 Segmental and somatic dysfunction of lumbar region: Secondary | ICD-10-CM | POA: Diagnosis not present

## 2015-06-24 ENCOUNTER — Other Ambulatory Visit: Payer: Self-pay | Admitting: Internal Medicine

## 2015-06-25 DIAGNOSIS — M5432 Sciatica, left side: Secondary | ICD-10-CM | POA: Diagnosis not present

## 2015-06-25 DIAGNOSIS — M5417 Radiculopathy, lumbosacral region: Secondary | ICD-10-CM | POA: Diagnosis not present

## 2015-06-25 DIAGNOSIS — M9902 Segmental and somatic dysfunction of thoracic region: Secondary | ICD-10-CM | POA: Diagnosis not present

## 2015-06-25 DIAGNOSIS — M9904 Segmental and somatic dysfunction of sacral region: Secondary | ICD-10-CM | POA: Diagnosis not present

## 2015-06-25 DIAGNOSIS — M5415 Radiculopathy, thoracolumbar region: Secondary | ICD-10-CM | POA: Diagnosis not present

## 2015-06-25 DIAGNOSIS — M9903 Segmental and somatic dysfunction of lumbar region: Secondary | ICD-10-CM | POA: Diagnosis not present

## 2015-06-29 DIAGNOSIS — M9902 Segmental and somatic dysfunction of thoracic region: Secondary | ICD-10-CM | POA: Diagnosis not present

## 2015-06-29 DIAGNOSIS — M5417 Radiculopathy, lumbosacral region: Secondary | ICD-10-CM | POA: Diagnosis not present

## 2015-06-29 DIAGNOSIS — M9903 Segmental and somatic dysfunction of lumbar region: Secondary | ICD-10-CM | POA: Diagnosis not present

## 2015-06-29 DIAGNOSIS — M9904 Segmental and somatic dysfunction of sacral region: Secondary | ICD-10-CM | POA: Diagnosis not present

## 2015-06-29 DIAGNOSIS — M5415 Radiculopathy, thoracolumbar region: Secondary | ICD-10-CM | POA: Diagnosis not present

## 2015-06-29 DIAGNOSIS — M5432 Sciatica, left side: Secondary | ICD-10-CM | POA: Diagnosis not present

## 2015-07-02 DIAGNOSIS — M9902 Segmental and somatic dysfunction of thoracic region: Secondary | ICD-10-CM | POA: Diagnosis not present

## 2015-07-02 DIAGNOSIS — M9903 Segmental and somatic dysfunction of lumbar region: Secondary | ICD-10-CM | POA: Diagnosis not present

## 2015-07-02 DIAGNOSIS — M5415 Radiculopathy, thoracolumbar region: Secondary | ICD-10-CM | POA: Diagnosis not present

## 2015-07-02 DIAGNOSIS — M5432 Sciatica, left side: Secondary | ICD-10-CM | POA: Diagnosis not present

## 2015-07-02 DIAGNOSIS — M9904 Segmental and somatic dysfunction of sacral region: Secondary | ICD-10-CM | POA: Diagnosis not present

## 2015-07-02 DIAGNOSIS — M5417 Radiculopathy, lumbosacral region: Secondary | ICD-10-CM | POA: Diagnosis not present

## 2015-07-13 DIAGNOSIS — M5432 Sciatica, left side: Secondary | ICD-10-CM | POA: Diagnosis not present

## 2015-07-13 DIAGNOSIS — M9903 Segmental and somatic dysfunction of lumbar region: Secondary | ICD-10-CM | POA: Diagnosis not present

## 2015-07-13 DIAGNOSIS — M9902 Segmental and somatic dysfunction of thoracic region: Secondary | ICD-10-CM | POA: Diagnosis not present

## 2015-07-13 DIAGNOSIS — M5415 Radiculopathy, thoracolumbar region: Secondary | ICD-10-CM | POA: Diagnosis not present

## 2015-07-13 DIAGNOSIS — M9904 Segmental and somatic dysfunction of sacral region: Secondary | ICD-10-CM | POA: Diagnosis not present

## 2015-07-13 DIAGNOSIS — M5417 Radiculopathy, lumbosacral region: Secondary | ICD-10-CM | POA: Diagnosis not present

## 2015-07-16 DIAGNOSIS — M5415 Radiculopathy, thoracolumbar region: Secondary | ICD-10-CM | POA: Diagnosis not present

## 2015-07-16 DIAGNOSIS — M5417 Radiculopathy, lumbosacral region: Secondary | ICD-10-CM | POA: Diagnosis not present

## 2015-07-16 DIAGNOSIS — M9904 Segmental and somatic dysfunction of sacral region: Secondary | ICD-10-CM | POA: Diagnosis not present

## 2015-07-16 DIAGNOSIS — M5432 Sciatica, left side: Secondary | ICD-10-CM | POA: Diagnosis not present

## 2015-07-16 DIAGNOSIS — M9902 Segmental and somatic dysfunction of thoracic region: Secondary | ICD-10-CM | POA: Diagnosis not present

## 2015-07-16 DIAGNOSIS — M9903 Segmental and somatic dysfunction of lumbar region: Secondary | ICD-10-CM | POA: Diagnosis not present

## 2015-07-22 DIAGNOSIS — M5415 Radiculopathy, thoracolumbar region: Secondary | ICD-10-CM | POA: Diagnosis not present

## 2015-07-22 DIAGNOSIS — M5432 Sciatica, left side: Secondary | ICD-10-CM | POA: Diagnosis not present

## 2015-07-22 DIAGNOSIS — M5417 Radiculopathy, lumbosacral region: Secondary | ICD-10-CM | POA: Diagnosis not present

## 2015-07-22 DIAGNOSIS — M9902 Segmental and somatic dysfunction of thoracic region: Secondary | ICD-10-CM | POA: Diagnosis not present

## 2015-07-22 DIAGNOSIS — M9903 Segmental and somatic dysfunction of lumbar region: Secondary | ICD-10-CM | POA: Diagnosis not present

## 2015-07-22 DIAGNOSIS — M9904 Segmental and somatic dysfunction of sacral region: Secondary | ICD-10-CM | POA: Diagnosis not present

## 2015-07-24 ENCOUNTER — Encounter: Payer: Self-pay | Admitting: Internal Medicine

## 2015-07-28 DIAGNOSIS — M9904 Segmental and somatic dysfunction of sacral region: Secondary | ICD-10-CM | POA: Diagnosis not present

## 2015-07-28 DIAGNOSIS — M9902 Segmental and somatic dysfunction of thoracic region: Secondary | ICD-10-CM | POA: Diagnosis not present

## 2015-07-28 DIAGNOSIS — M9903 Segmental and somatic dysfunction of lumbar region: Secondary | ICD-10-CM | POA: Diagnosis not present

## 2015-07-28 DIAGNOSIS — M5432 Sciatica, left side: Secondary | ICD-10-CM | POA: Diagnosis not present

## 2015-07-28 DIAGNOSIS — M5417 Radiculopathy, lumbosacral region: Secondary | ICD-10-CM | POA: Diagnosis not present

## 2015-07-28 DIAGNOSIS — M5415 Radiculopathy, thoracolumbar region: Secondary | ICD-10-CM | POA: Diagnosis not present

## 2015-08-04 DIAGNOSIS — M9903 Segmental and somatic dysfunction of lumbar region: Secondary | ICD-10-CM | POA: Diagnosis not present

## 2015-08-04 DIAGNOSIS — M5432 Sciatica, left side: Secondary | ICD-10-CM | POA: Diagnosis not present

## 2015-08-04 DIAGNOSIS — M9902 Segmental and somatic dysfunction of thoracic region: Secondary | ICD-10-CM | POA: Diagnosis not present

## 2015-08-04 DIAGNOSIS — M9904 Segmental and somatic dysfunction of sacral region: Secondary | ICD-10-CM | POA: Diagnosis not present

## 2015-08-04 DIAGNOSIS — M5415 Radiculopathy, thoracolumbar region: Secondary | ICD-10-CM | POA: Diagnosis not present

## 2015-08-04 DIAGNOSIS — M5417 Radiculopathy, lumbosacral region: Secondary | ICD-10-CM | POA: Diagnosis not present

## 2015-08-11 DIAGNOSIS — M9904 Segmental and somatic dysfunction of sacral region: Secondary | ICD-10-CM | POA: Diagnosis not present

## 2015-08-11 DIAGNOSIS — M9903 Segmental and somatic dysfunction of lumbar region: Secondary | ICD-10-CM | POA: Diagnosis not present

## 2015-08-11 DIAGNOSIS — M5417 Radiculopathy, lumbosacral region: Secondary | ICD-10-CM | POA: Diagnosis not present

## 2015-08-11 DIAGNOSIS — M9902 Segmental and somatic dysfunction of thoracic region: Secondary | ICD-10-CM | POA: Diagnosis not present

## 2015-08-11 DIAGNOSIS — M5432 Sciatica, left side: Secondary | ICD-10-CM | POA: Diagnosis not present

## 2015-08-11 DIAGNOSIS — M5415 Radiculopathy, thoracolumbar region: Secondary | ICD-10-CM | POA: Diagnosis not present

## 2015-08-13 ENCOUNTER — Other Ambulatory Visit: Payer: Self-pay

## 2015-08-13 DIAGNOSIS — Z1231 Encounter for screening mammogram for malignant neoplasm of breast: Secondary | ICD-10-CM

## 2015-08-18 DIAGNOSIS — M5415 Radiculopathy, thoracolumbar region: Secondary | ICD-10-CM | POA: Diagnosis not present

## 2015-08-18 DIAGNOSIS — M5432 Sciatica, left side: Secondary | ICD-10-CM | POA: Diagnosis not present

## 2015-08-18 DIAGNOSIS — M9902 Segmental and somatic dysfunction of thoracic region: Secondary | ICD-10-CM | POA: Diagnosis not present

## 2015-08-18 DIAGNOSIS — M9903 Segmental and somatic dysfunction of lumbar region: Secondary | ICD-10-CM | POA: Diagnosis not present

## 2015-08-18 DIAGNOSIS — M9904 Segmental and somatic dysfunction of sacral region: Secondary | ICD-10-CM | POA: Diagnosis not present

## 2015-08-18 DIAGNOSIS — M5417 Radiculopathy, lumbosacral region: Secondary | ICD-10-CM | POA: Diagnosis not present

## 2015-08-20 ENCOUNTER — Ambulatory Visit: Payer: Medicare PPO | Admitting: Internal Medicine

## 2015-08-25 ENCOUNTER — Ambulatory Visit (INDEPENDENT_AMBULATORY_CARE_PROVIDER_SITE_OTHER): Payer: Medicare PPO | Admitting: Internal Medicine

## 2015-08-25 ENCOUNTER — Encounter: Payer: Self-pay | Admitting: Internal Medicine

## 2015-08-25 ENCOUNTER — Other Ambulatory Visit (INDEPENDENT_AMBULATORY_CARE_PROVIDER_SITE_OTHER): Payer: Medicare PPO

## 2015-08-25 VITALS — BP 134/88 | HR 65 | Temp 98.0°F | Resp 16 | Ht 70.0 in | Wt 319.0 lb

## 2015-08-25 DIAGNOSIS — N183 Chronic kidney disease, stage 3 unspecified: Secondary | ICD-10-CM

## 2015-08-25 DIAGNOSIS — I10 Essential (primary) hypertension: Secondary | ICD-10-CM

## 2015-08-25 DIAGNOSIS — I70211 Atherosclerosis of native arteries of extremities with intermittent claudication, right leg: Secondary | ICD-10-CM | POA: Diagnosis not present

## 2015-08-25 DIAGNOSIS — E118 Type 2 diabetes mellitus with unspecified complications: Secondary | ICD-10-CM | POA: Diagnosis not present

## 2015-08-25 LAB — URINALYSIS, ROUTINE W REFLEX MICROSCOPIC
Bilirubin Urine: NEGATIVE
KETONES UR: NEGATIVE
Nitrite: POSITIVE — AB
SPECIFIC GRAVITY, URINE: 1.025 (ref 1.000–1.030)
TOTAL PROTEIN, URINE-UPE24: 100 — AB
URINE GLUCOSE: NEGATIVE
UROBILINOGEN UA: 0.2 (ref 0.0–1.0)
pH: 5.5 (ref 5.0–8.0)

## 2015-08-25 LAB — HEMOGLOBIN A1C: HEMOGLOBIN A1C: 6.9 % — AB (ref 4.6–6.5)

## 2015-08-25 LAB — BASIC METABOLIC PANEL
BUN: 26 mg/dL — ABNORMAL HIGH (ref 6–23)
CO2: 26 meq/L (ref 19–32)
CREATININE: 1.12 mg/dL (ref 0.40–1.20)
Calcium: 10 mg/dL (ref 8.4–10.5)
Chloride: 101 mEq/L (ref 96–112)
GFR: 61.45 mL/min (ref >60.00)
Glucose, Bld: 139 mg/dL — ABNORMAL HIGH (ref 70–99)
Potassium: 4.1 mEq/L (ref 3.5–5.1)
Sodium: 138 mEq/L (ref 135–145)

## 2015-08-25 MED ORDER — LISINOPRIL-HYDROCHLOROTHIAZIDE 20-25 MG PO TABS: 1.0000 | ORAL_TABLET | Freq: Every day | ORAL | Status: DC

## 2015-08-25 MED ORDER — METFORMIN HCL 1000 MG PO TABS: ORAL_TABLET | ORAL | Status: DC

## 2015-08-25 MED ORDER — SIMVASTATIN 40 MG PO TABS: 40.0000 mg | ORAL_TABLET | Freq: Every day | ORAL | Status: DC

## 2015-08-25 NOTE — Progress Notes (Signed)
Pre visit review using our clinic review tool, if applicable. No additional management support is needed unless otherwise documented below in the visit note. 

## 2015-08-25 NOTE — Patient Instructions (Signed)

## 2015-08-25 NOTE — Progress Notes (Signed)
Subjective:  Patient ID: Holly [name], female    DOB: Apr 20, 1941  Age: 74 y.o. MRN: [id number]  CC: Diabetes and Hypertension   HPI [name] presents for follow-up on hypertension and diabetes. She feels well and offers no new complaints.  Outpatient Prescriptions Prior to Visit  Medication Sig Dispense Refill  . ACCU-CHEK FASTCLIX LANCETS MISC Use as directed to test blood sugars Dx: 250.00 102 each 12  . aspirin 81 MG EC tablet Take 81 mg by mouth daily.      . Blood Glucose Monitoring Suppl (ACCU-CHEK NANO SMARTVIEW) W/DEVICE KIT Use as directed to test blood sugars Dx: 250.00 1 kit 0  . cetirizine (ZYRTEC ALLERGY) 10 MG tablet Take 10 mg by mouth daily.      . Cholecalciferol (VITAMIN D) 400 UNIT capsule Take 400 Units by mouth daily.      Marland Kitchen gabapentin (NEURONTIN) 300 MG capsule nightly (Patient taking differently: 300 mg 3 (three) times daily. nightly) 30 capsule 3  . glucose blood (ACCU-CHEK SMARTVIEW) test strip Use as instructed to test blood sugars Dx: 250.00 100 each 12  . nebivolol (BYSTOLIC) 10 MG tablet Take 1 tablet (10 mg total) by mouth daily. 90 tablet 3  . oxybutynin (DITROPAN-XL) 5 MG 24 hr tablet     . oxyCODONE-acetaminophen (PERCOCET) 7.5-325 MG per tablet Take 1 tablet by mouth every 4 (four) hours as needed. 50 tablet 0  . lisinopril-hydrochlorothiazide (PRINZIDE,ZESTORETIC) 20-25 MG per tablet TAKE ONE TABLET BY MOUTH ONCE DAILY 90 tablet 1  . meloxicam (MOBIC) 7.5 MG tablet     . metFORMIN (GLUCOPHAGE) 1000 MG tablet TAKE 1 TABLET TWICE DAILY FOR DIABETES CONTROL 180 tablet 3   No facility-administered medications prior to visit.    ROS Review of Systems  Constitutional: Negative.  Negative for fever, chills, diaphoresis, appetite change and fatigue.  HENT: Negative.   Eyes: Negative.   Respiratory: Negative.  Negative for cough, choking, chest tightness, shortness of breath and stridor.   Cardiovascular: Negative.  Negative for chest pain,  palpitations and leg swelling.  Gastrointestinal: Negative.  Negative for nausea, vomiting, abdominal pain, diarrhea, constipation and blood in stool.  Endocrine: Negative.  Negative for polydipsia, polyphagia and polyuria.  Genitourinary: Positive for frequency. Negative for dysuria, urgency, hematuria, flank pain and difficulty urinating.  Musculoskeletal: Positive for back pain and arthralgias. Negative for myalgias and joint swelling.  Skin: Negative.  Negative for rash.  Allergic/Immunologic: Negative.   Neurological: Negative.  Negative for dizziness, tremors, syncope, numbness and headaches.  Hematological: Negative.  Negative for adenopathy. Does not bruise/bleed easily.  Psychiatric/Behavioral: Negative.     Objective:  BP 134/88 mmHg  Pulse 65  Temp(Src) 98 F (36.7 C) (Oral)  Resp 16  Ht '5\' 10"'  (1.778 m)  Wt 319 lb (144.697 kg)  BMI 45.77 kg/m2  SpO2 96%  BP Readings from Last 3 Encounters:  08/25/15 134/88  06/18/15 138/82  04/20/15 118/82    Wt Readings from Last 3 Encounters:  08/25/15 319 lb (144.697 kg)  06/18/15 320 lb (145.151 kg)  04/20/15 316 lb (143.337 kg)    Physical Exam  Constitutional: She is oriented to person, place, and time. No distress.  HENT:  Head: Normocephalic and atraumatic.  Mouth/Throat: Oropharynx is clear and moist. No oropharyngeal exudate.  Eyes: Conjunctivae are normal. Right eye exhibits no discharge. Left eye exhibits no discharge. No scleral icterus.  Neck: Normal range of motion. Neck supple. No JVD present. No tracheal deviation present. No  thyromegaly present.  Cardiovascular: Normal rate, regular rhythm, normal heart sounds and intact distal pulses.  Exam reveals no gallop and no friction rub.   No murmur heard. Pulses:      Carotid pulses are 1+ on the right side, and 1+ on the left side.      Radial pulses are 1+ on the right side, and 1+ on the left side.       Femoral pulses are 1+ on the right side, and 1+ on the  left side.      Popliteal pulses are 1+ on the right side, and 1+ on the left side.       Dorsalis pedis pulses are 1+ on the right side, and 1+ on the left side.       Posterior tibial pulses are 1+ on the right side, and 1+ on the left side.  Pulmonary/Chest: Effort normal and breath sounds normal. No stridor. No respiratory distress. She has no wheezes. She has no rales. She exhibits no tenderness.  Abdominal: Soft. Bowel sounds are normal. She exhibits no distension and no mass. There is no tenderness. There is no rebound and no guarding.  Musculoskeletal: Normal range of motion. She exhibits no edema or tenderness.  Lymphadenopathy:    She has no cervical adenopathy.  Neurological: She is oriented to person, place, and time.  Skin: Skin is warm and dry. No rash noted. She is not diaphoretic. No erythema. No pallor.  Vitals reviewed.   Lab Results  Component Value Date   WBC 7.5 11/10/2014   HGB 13.5 11/10/2014   HCT 42.1 11/10/2014   PLT 256.0 11/10/2014   GLUCOSE 139* 08/25/2015   CHOL 205* 04/20/2015   TRIG 128.0 04/20/2015   HDL 73.90 04/20/2015   LDLDIRECT 134.8 08/27/2010   LDLCALC 106* 04/20/2015   ALT 19 11/05/2013   AST 17 11/05/2013   NA 138 08/25/2015   K 4.1 08/25/2015   CL 101 08/25/2015   CREATININE 1.12 08/25/2015   BUN 26* 08/25/2015   CO2 26 08/25/2015   TSH 3.56 04/20/2015   HGBA1C 6.9* 08/25/2015   MICROALBUR 50.3* 08/25/2015    Dg Facet Jt Inj L /s Single Level Left W/fl/ct  12/01/2014   CLINICAL DATA:  Facet mediated lumbago. Lumbosacral spondylosis without myelopathy. LEFT-sided radicular symptoms. Improvement in RIGHT-sided symptoms after the previous lumbar epidural.  EXAM: LEFT L4-5  FACET INJECTION UNDER FLUOROSCOPY  FLUOROSCOPY TIME:  10 seconds corresponding to a Dose Area Product of 12 Gy*m2  TECHNIQUE: Informed written consent was obtained. Time-out procedure was performed.  An appropriate skin entry site was determined fluoroscopically and  marked. Operator donned sterile gloves and mask. Site prepped with betadine, draped in usual sterile fashion, and infiltrated locally with 1% lidocaine. A 22 gauge spinal needle was advanced to the posterior margin of the LEFT L4-5 facet under fluoroscopy. Diagnostic injection of 0.5 ml Omnipaque 180 confirmed intra-articular/juxta-articular spread without any intrathecal or intravascular component. 120.25m Depo-Medrol in 1 ml lidocaine 1% was administered.  IMPRESSION: 1. Technically successful LEFT L4-5 facet injection.   Electronically Signed   By: JRolla FlattenM.D.   On: 12/01/2014 14:50    Assessment & Plan:   BKorenawas seen today for diabetes and hypertension.  Diagnoses and all orders for this visit:  Atherosclerosis of native artery of right lower extremity with intermittent claudication ([place]- she has no s/s related to this, will cont the statin, good blood sugar and blood pressure control, and asa  Essential hypertension- her BP is well controlled, lytes and renal function are stable -     Basic metabolic panel; Future -     Urinalysis, Routine w reflex microscopic (not at [facility]); Future  Type 2 diabetes mellitus with complication, without long-term current use of insulin ([place])- her blood sugars are well controlled. She agrees to improve her lifestyle modifications -     Basic metabolic panel; Future -     Microalbumin / creatinine urine ratio; Future -     Urinalysis, Routine w reflex microscopic (not at [facility]); Future -     Hemoglobin A1c; Future  Kidney disease, chronic, stage III (GFR 30-59 ml/min)- she has microalbuminuria, will cont the ACEI, her overall renal function is stable -     Basic metabolic panel; Future -     Urinalysis, Routine w reflex microscopic (not at [facility]); Future  Other orders -     metFORMIN (GLUCOPHAGE) 1000 MG tablet; TAKE 1 TABLET TWICE DAILY FOR DIABETES CONTROL -     lisinopril-hydrochlorothiazide (PRINZIDE,ZESTORETIC) 20-25 MG tablet; Take 1  tablet by mouth daily. -     simvastatin (ZOCOR) 40 MG tablet; Take 1 tablet (40 mg total) by mouth daily.  I have discontinued Ms. Solar's meloxicam. I have also changed her lisinopril-hydrochlorothiazide and simvastatin. Additionally, I am having her maintain her aspirin, Vitamin D, cetirizine, ACCU-CHEK NANO SMARTVIEW, glucose blood, ACCU-CHEK FASTCLIX LANCETS, oxybutynin, gabapentin, nebivolol, oxyCODONE-acetaminophen, and metFORMIN.  Meds ordered this encounter  Medications  . DISCONTD: simvastatin (ZOCOR) 40 MG tablet    Sig:   . metFORMIN (GLUCOPHAGE) 1000 MG tablet    Sig: TAKE 1 TABLET TWICE DAILY FOR DIABETES CONTROL    Dispense:  180 tablet    Refill:  3  . lisinopril-hydrochlorothiazide (PRINZIDE,ZESTORETIC) 20-25 MG tablet    Sig: Take 1 tablet by mouth daily.    Dispense:  90 tablet    Refill:  1  . simvastatin (ZOCOR) 40 MG tablet    Sig: Take 1 tablet (40 mg total) by mouth daily.    Dispense:  90 tablet    Refill:  3     Follow-up: Return in about 4 months (around 12/26/2015).  Scarlette Calico, MD

## 2015-08-26 DIAGNOSIS — M9904 Segmental and somatic dysfunction of sacral region: Secondary | ICD-10-CM | POA: Diagnosis not present

## 2015-08-26 DIAGNOSIS — M9903 Segmental and somatic dysfunction of lumbar region: Secondary | ICD-10-CM | POA: Diagnosis not present

## 2015-08-26 DIAGNOSIS — M5417 Radiculopathy, lumbosacral region: Secondary | ICD-10-CM | POA: Diagnosis not present

## 2015-08-26 DIAGNOSIS — M5415 Radiculopathy, thoracolumbar region: Secondary | ICD-10-CM | POA: Diagnosis not present

## 2015-08-26 DIAGNOSIS — M9902 Segmental and somatic dysfunction of thoracic region: Secondary | ICD-10-CM | POA: Diagnosis not present

## 2015-08-26 DIAGNOSIS — M5432 Sciatica, left side: Secondary | ICD-10-CM | POA: Diagnosis not present

## 2015-08-26 LAB — MICROALBUMIN / CREATININE URINE RATIO
CREATININE, U: 125.6 mg/dL
Microalb Creat Ratio: 40 mg/g — ABNORMAL HIGH (ref 0.0–30.0)
Microalb, Ur: 50.3 mg/dL — ABNORMAL HIGH (ref 0.0–1.9)

## 2015-08-27 ENCOUNTER — Encounter: Payer: Self-pay | Admitting: Internal Medicine

## 2015-08-28 ENCOUNTER — Ambulatory Visit
Admission: RE | Admit: 2015-08-28 | Discharge: 2015-08-28 | Disposition: A | Discharge status: Home / Self Care | Payer: Medicare PPO | Source: Ambulatory Visit

## 2015-08-28 DIAGNOSIS — Z1231 Encounter for screening mammogram for malignant neoplasm of breast: Secondary | ICD-10-CM

## 2015-08-28 LAB — HM MAMMOGRAPHY

## 2015-08-30 NOTE — Addendum Note (Signed)
Addended by: [name] on: 08/30/2015 10:10 AM   Modules accepted: Miquel Dunn

## 2015-08-31 DIAGNOSIS — M170 Bilateral primary osteoarthritis of knee: Secondary | ICD-10-CM | POA: Diagnosis not present

## 2015-09-01 DIAGNOSIS — M9903 Segmental and somatic dysfunction of lumbar region: Secondary | ICD-10-CM | POA: Diagnosis not present

## 2015-09-01 DIAGNOSIS — M9904 Segmental and somatic dysfunction of sacral region: Secondary | ICD-10-CM | POA: Diagnosis not present

## 2015-09-01 DIAGNOSIS — M9902 Segmental and somatic dysfunction of thoracic region: Secondary | ICD-10-CM | POA: Diagnosis not present

## 2015-09-01 DIAGNOSIS — M5415 Radiculopathy, thoracolumbar region: Secondary | ICD-10-CM | POA: Diagnosis not present

## 2015-09-01 DIAGNOSIS — M5432 Sciatica, left side: Secondary | ICD-10-CM | POA: Diagnosis not present

## 2015-09-01 DIAGNOSIS — M5417 Radiculopathy, lumbosacral region: Secondary | ICD-10-CM | POA: Diagnosis not present

## 2015-09-08 DIAGNOSIS — M9904 Segmental and somatic dysfunction of sacral region: Secondary | ICD-10-CM | POA: Diagnosis not present

## 2015-09-08 DIAGNOSIS — M9902 Segmental and somatic dysfunction of thoracic region: Secondary | ICD-10-CM | POA: Diagnosis not present

## 2015-09-08 DIAGNOSIS — M5417 Radiculopathy, lumbosacral region: Secondary | ICD-10-CM | POA: Diagnosis not present

## 2015-09-08 DIAGNOSIS — M5432 Sciatica, left side: Secondary | ICD-10-CM | POA: Diagnosis not present

## 2015-09-08 DIAGNOSIS — M9903 Segmental and somatic dysfunction of lumbar region: Secondary | ICD-10-CM | POA: Diagnosis not present

## 2015-09-08 DIAGNOSIS — M5415 Radiculopathy, thoracolumbar region: Secondary | ICD-10-CM | POA: Diagnosis not present

## 2015-09-15 DIAGNOSIS — M5432 Sciatica, left side: Secondary | ICD-10-CM | POA: Diagnosis not present

## 2015-09-15 DIAGNOSIS — M9902 Segmental and somatic dysfunction of thoracic region: Secondary | ICD-10-CM | POA: Diagnosis not present

## 2015-09-15 DIAGNOSIS — M9904 Segmental and somatic dysfunction of sacral region: Secondary | ICD-10-CM | POA: Diagnosis not present

## 2015-09-15 DIAGNOSIS — M5415 Radiculopathy, thoracolumbar region: Secondary | ICD-10-CM | POA: Diagnosis not present

## 2015-09-15 DIAGNOSIS — M5417 Radiculopathy, lumbosacral region: Secondary | ICD-10-CM | POA: Diagnosis not present

## 2015-09-15 DIAGNOSIS — M9903 Segmental and somatic dysfunction of lumbar region: Secondary | ICD-10-CM | POA: Diagnosis not present

## 2015-09-22 DIAGNOSIS — M5432 Sciatica, left side: Secondary | ICD-10-CM | POA: Diagnosis not present

## 2015-09-22 DIAGNOSIS — M9903 Segmental and somatic dysfunction of lumbar region: Secondary | ICD-10-CM | POA: Diagnosis not present

## 2015-09-22 DIAGNOSIS — M9904 Segmental and somatic dysfunction of sacral region: Secondary | ICD-10-CM | POA: Diagnosis not present

## 2015-09-22 DIAGNOSIS — M5415 Radiculopathy, thoracolumbar region: Secondary | ICD-10-CM | POA: Diagnosis not present

## 2015-09-22 DIAGNOSIS — M5417 Radiculopathy, lumbosacral region: Secondary | ICD-10-CM | POA: Diagnosis not present

## 2015-09-22 DIAGNOSIS — M9902 Segmental and somatic dysfunction of thoracic region: Secondary | ICD-10-CM | POA: Diagnosis not present

## 2015-09-29 DIAGNOSIS — M5415 Radiculopathy, thoracolumbar region: Secondary | ICD-10-CM | POA: Diagnosis not present

## 2015-09-29 DIAGNOSIS — M5417 Radiculopathy, lumbosacral region: Secondary | ICD-10-CM | POA: Diagnosis not present

## 2015-09-29 DIAGNOSIS — M9904 Segmental and somatic dysfunction of sacral region: Secondary | ICD-10-CM | POA: Diagnosis not present

## 2015-09-29 DIAGNOSIS — M9902 Segmental and somatic dysfunction of thoracic region: Secondary | ICD-10-CM | POA: Diagnosis not present

## 2015-09-29 DIAGNOSIS — M5432 Sciatica, left side: Secondary | ICD-10-CM | POA: Diagnosis not present

## 2015-09-29 DIAGNOSIS — M9903 Segmental and somatic dysfunction of lumbar region: Secondary | ICD-10-CM | POA: Diagnosis not present

## 2015-10-13 DIAGNOSIS — M9902 Segmental and somatic dysfunction of thoracic region: Secondary | ICD-10-CM | POA: Diagnosis not present

## 2015-10-13 DIAGNOSIS — M5432 Sciatica, left side: Secondary | ICD-10-CM | POA: Diagnosis not present

## 2015-10-13 DIAGNOSIS — M5415 Radiculopathy, thoracolumbar region: Secondary | ICD-10-CM | POA: Diagnosis not present

## 2015-10-13 DIAGNOSIS — M5417 Radiculopathy, lumbosacral region: Secondary | ICD-10-CM | POA: Diagnosis not present

## 2015-10-13 DIAGNOSIS — M9903 Segmental and somatic dysfunction of lumbar region: Secondary | ICD-10-CM | POA: Diagnosis not present

## 2015-10-13 DIAGNOSIS — M9904 Segmental and somatic dysfunction of sacral region: Secondary | ICD-10-CM | POA: Diagnosis not present

## 2015-10-18 IMAGING — CR DG LUMBAR SPINE COMPLETE 4+V
7 series · 7 of 7 positions shown · non-contrast
Comparison: None.

CLINICAL DATA: Low back and bilateral leg pain.  No known injury.

EXAM:
LUMBAR SPINE - COMPLETE 4+ VIEW

[t l-spine a.p.]
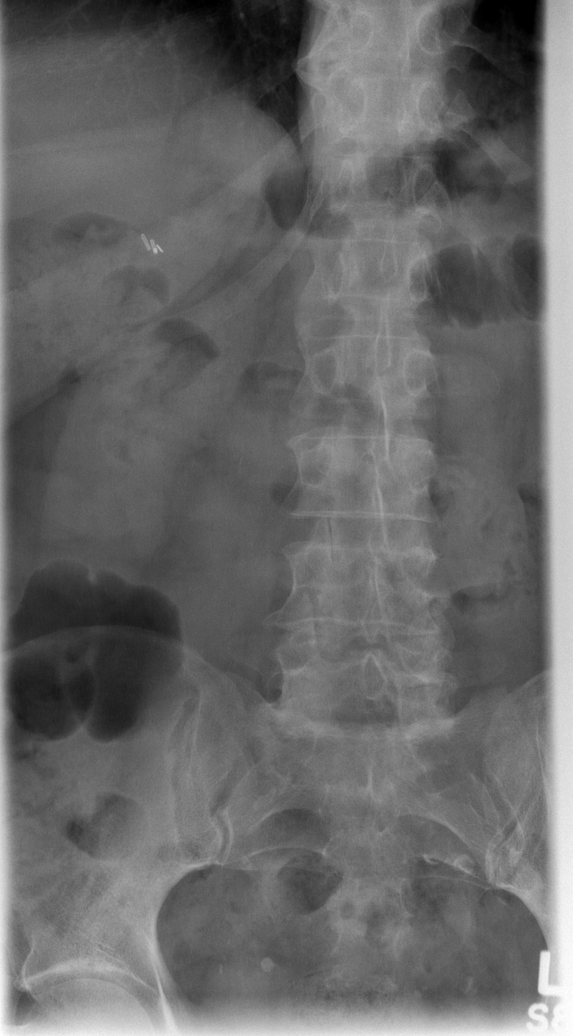

[t l-spine oblique exposure (1 of 3)]
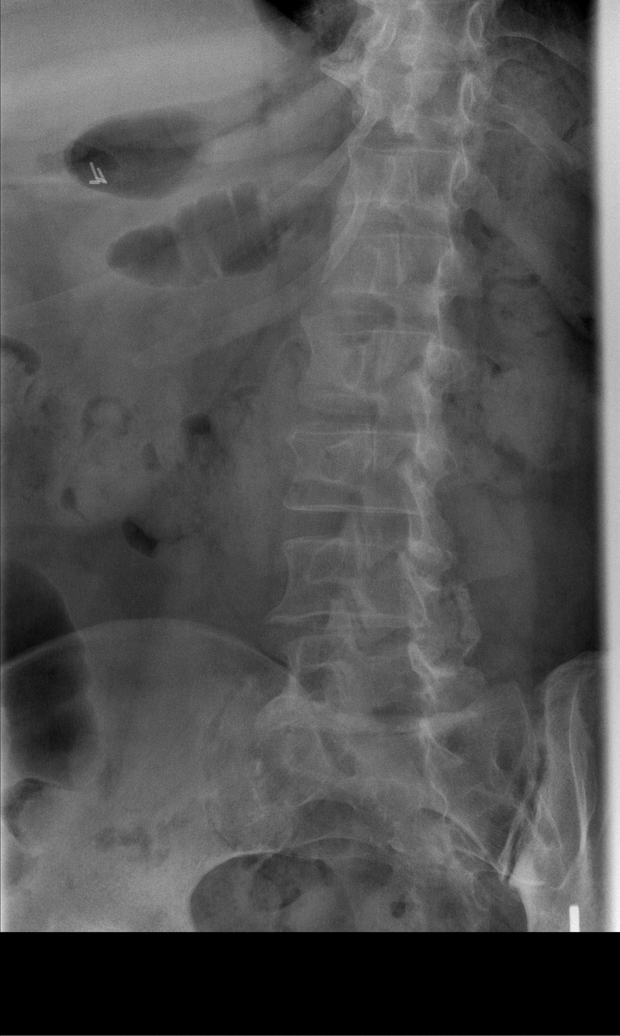

[t l-spine oblique exposure (2 of 3)]
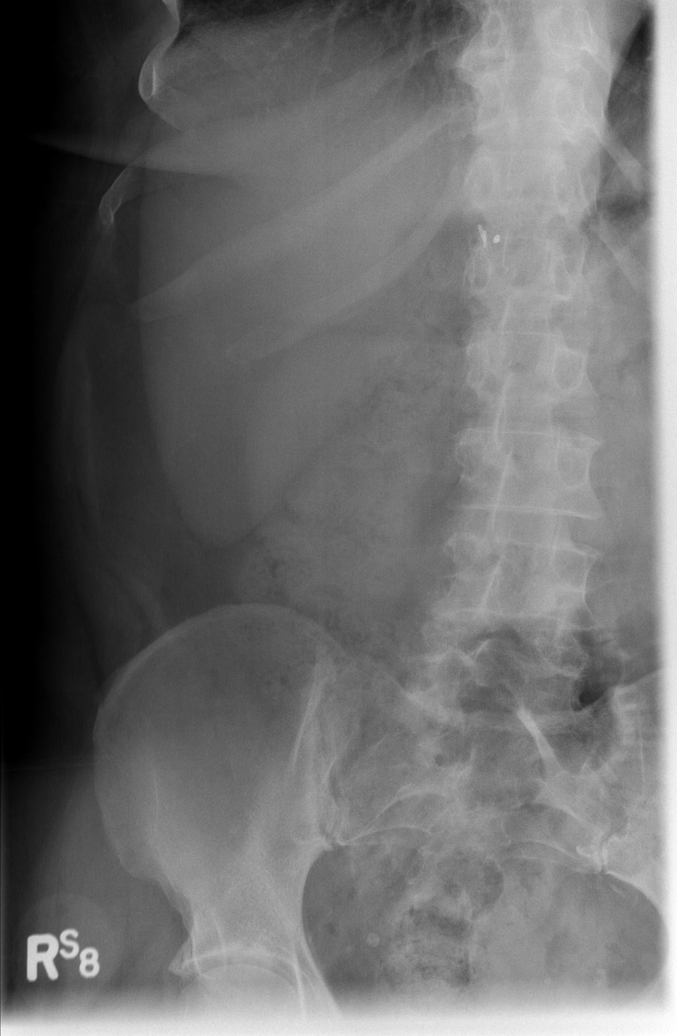

[t l-spine lat (1 of 2)]
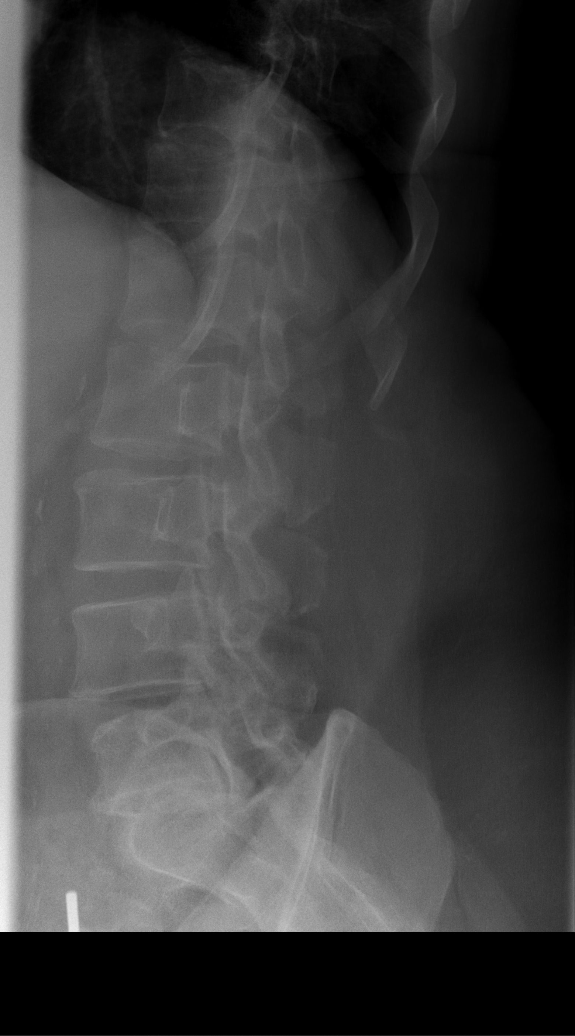

[t l-spine l5-s1 spot]
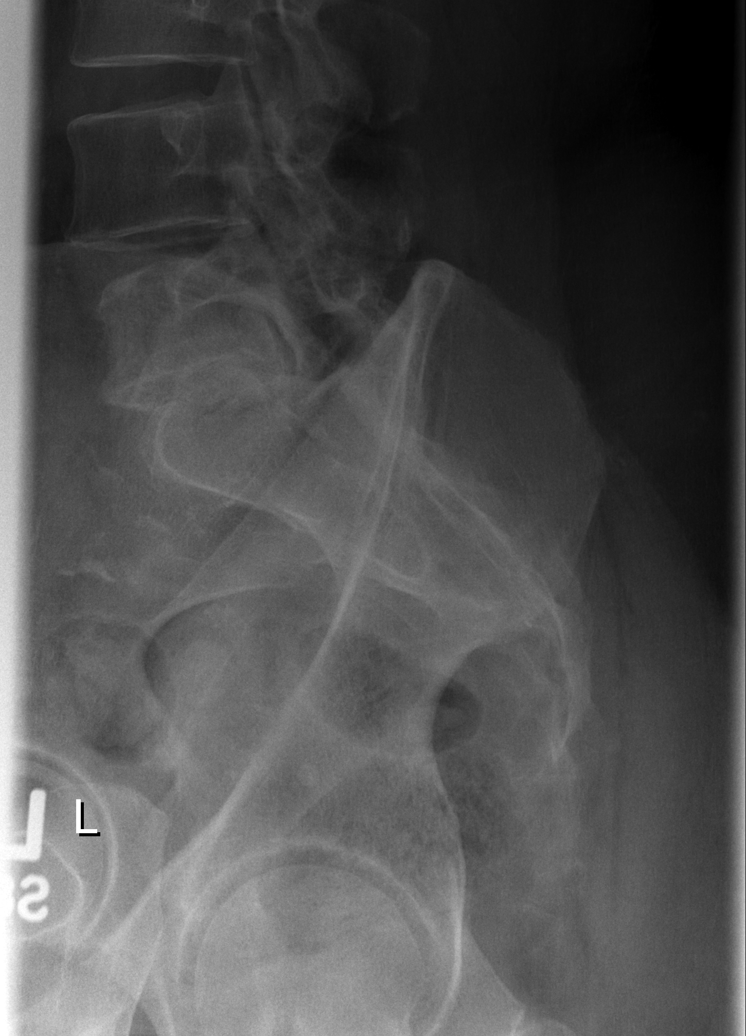

[t l-spine lat (2 of 2)]
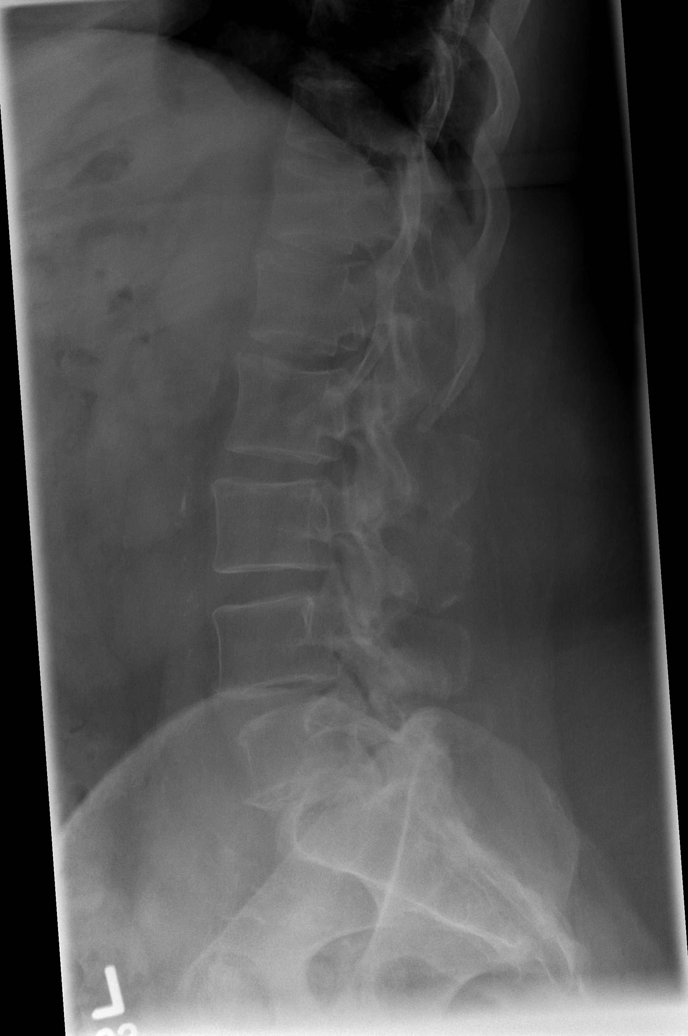

[t l-spine oblique exposure (3 of 3)]
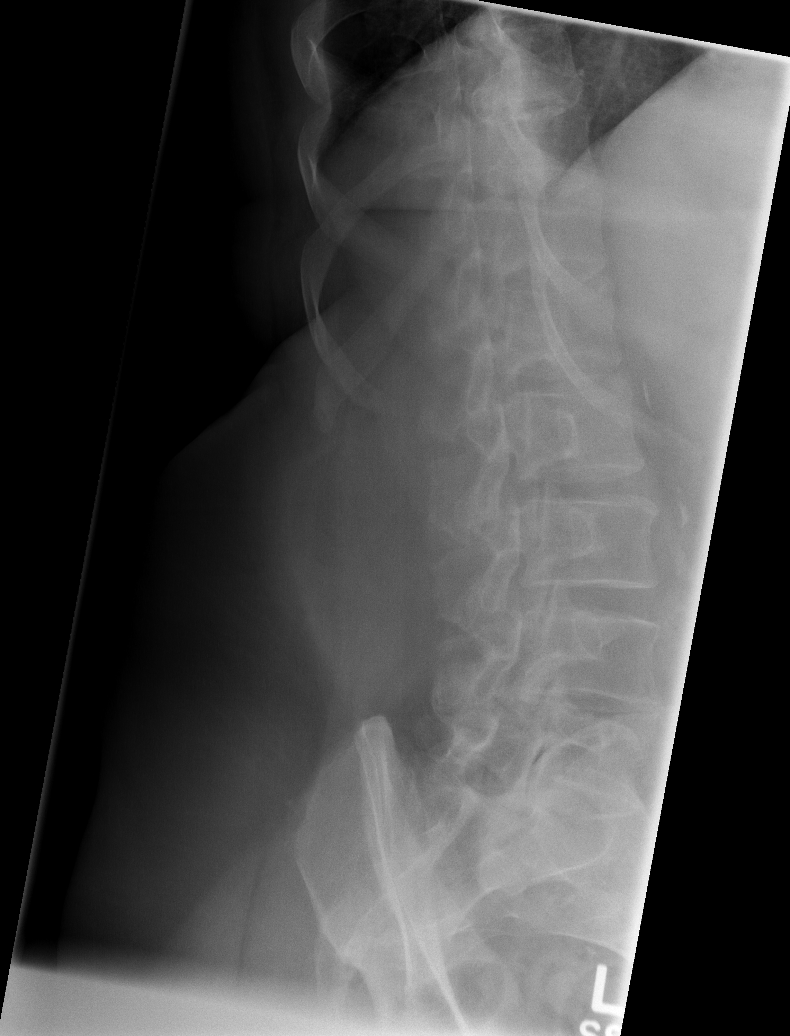

[7 of 7 positions shown; findings below may reference images not displayed]

FINDINGS: Normal alignment of the lumbar vertebral bodies. There is
degenerative disc disease at L5-S1. The other intervertebral disc
spaces are fairly well maintained. No definite pars defects. The
visualized bony pelvis is intact.
IMPRESSION: Normal alignment and no acute bony findings

Degenerative disc disease at L5-S1.

## 2015-10-20 DIAGNOSIS — M5415 Radiculopathy, thoracolumbar region: Secondary | ICD-10-CM | POA: Diagnosis not present

## 2015-10-20 DIAGNOSIS — M9902 Segmental and somatic dysfunction of thoracic region: Secondary | ICD-10-CM | POA: Diagnosis not present

## 2015-10-20 DIAGNOSIS — M9904 Segmental and somatic dysfunction of sacral region: Secondary | ICD-10-CM | POA: Diagnosis not present

## 2015-10-20 DIAGNOSIS — M9903 Segmental and somatic dysfunction of lumbar region: Secondary | ICD-10-CM | POA: Diagnosis not present

## 2015-10-20 DIAGNOSIS — M5417 Radiculopathy, lumbosacral region: Secondary | ICD-10-CM | POA: Diagnosis not present

## 2015-10-20 DIAGNOSIS — M5432 Sciatica, left side: Secondary | ICD-10-CM | POA: Diagnosis not present

## 2015-10-21 ENCOUNTER — Ambulatory Visit: Payer: Medicare PPO

## 2015-10-21 VITALS — BP 155/80 | Ht 69.5 in | Wt 327.0 lb

## 2015-10-21 DIAGNOSIS — Z0000 Encounter for general adult medical examination without abnormal findings: Secondary | ICD-10-CM

## 2015-10-21 NOTE — Progress Notes (Addendum)
Subjective:   Holly [name] is a 74 y.o. female who presents for Medicare Annual (Subsequent) preventive examination.  Review of Systems:  HRA assessment completed during visit; Holly, [name] The Patient was informed that this wellness visit is to identify risk and educate on how to reduce risk for increase disease through lifestyle changes.   ROS deferred to CPE exam with physician Brother had surgery on heart; stent Mother and father had high bp    Medical issues Atherosclerosis; htn; right knee djd, hyperlipidemia  Back pain with pain in left leg  Lipids 04/20/15; chol 205; trigl 128; HDL 73; LDL 106 A1c 6.9 (2015 was 7.7 )  HTN: BP elevated but was in pain this am secondary to back; states she had MRI with herniated disc; Is seeing chiropractor; checks BP at home and is generally good; 120/80/ has gained weight since thanksgiving  BMI: 48.3 Thinking about [facility]  Also given information on the [facility]  Diet; takes medicine on time Breakfast; salad for breakfast; fruit Lunch; late 1 to 2pm cereal; some left overs; Supper; meat and vegetables  Exercise; Like to go walk around the stores and look Can't walk that far;   SAFETY/ brother lives with her; now disabled;  One level; no steps  Safety reviewed for the home; shower at home; railing as needed; bathroom safety; has a friend that does odd jobs; grab bars; high toilet; Warehouse manager; security system; smoke detectors and firearms  Driving accidents no  Sun protection; yes Stressors; pain; 1-10; 7 most of the time  Medication review/ states she is not allergic to simvastatin; will leave on allergy for now; Does have small, scant red rash to lower inner arm; used dove;   Fall assessment/ no   Mobilization and Functional losses in the last year. Uses cane; due to knees hurts Sleep patterns; no   Urinary or fecal incontinence reviewed/ medicine helps but still gets up by 2 and  5pm  Counseling: DEXA  will discuss with Dr. Ronnald Ramp Colonoscopy; 04/30/2007/ due 2018 EKG: 05/06/2013 Hearing: no hearing issues Mammogram 08/28/2015;  Ophthalmology exam; 03/2015 no retinopathy[place] [facility];  Immunizations Due  Flu; 08/2015   Current Care Team reviewed and updated  Cardiac Risk Factors include: advanced age (>57mn, >>49women);dyslipidemia;diabetes mellitus;obesity (BMI >30kg/m2);sedentary lifestyle     Objective:     Vitals: BP 155/80 mmHg  Ht 5' 9.5" (1.765 m)  Wt 327 lb (148.326 kg)  BMI 47.61 kg/m2  Tobacco History  Smoking status  . Never Smoker   Smokeless tobacco  . Never Used     Counseling given: Yes   Past Medical History  Diagnosis Date  . Closed dislocation of shoulder     unspecified site  . DJD (degenerative joint disease) of knee     Right knee  . Obesity, morbid ([place]   . Hypertension   . Hyperlipidemia   . DM (diabetes mellitus) ([place]    Past Surgical History  Procedure Laterality Date  . Tubal ligation  1979  . Cholecystectomy  1989  . Knee arthroscopy  2010    LEFT/ Dr DSharol Given  Family History  Problem Relation Age of Onset  . Heart disease Mother   . Kidney disease Father   . Aortic aneurysm Brother     Survived rupture   History  Sexual Activity  . Sexual Activity: Not Currently    Outpatient Encounter Prescriptions as of 10/21/2015  Medication Sig  . ACCU-CHEK FASTCLIX  LANCETS MISC Use as directed to test blood sugars Dx: 250.00  . aspirin 81 MG EC tablet Take 81 mg by mouth daily.    . Blood Glucose Monitoring Suppl (ACCU-CHEK NANO SMARTVIEW) W/DEVICE KIT Use as directed to test blood sugars Dx: 250.00  . cetirizine (ZYRTEC ALLERGY) 10 MG tablet Take 10 mg by mouth daily.    . Cholecalciferol (VITAMIN D) 400 UNIT capsule Take 400 Units by mouth daily.    Marland Kitchen gabapentin (NEURONTIN) 300 MG capsule nightly (Patient taking differently: 300 mg 3 (three) times daily. nightly)  . glucose blood (ACCU-CHEK  SMARTVIEW) test strip Use as instructed to test blood sugars Dx: 250.00  . lisinopril-hydrochlorothiazide (PRINZIDE,ZESTORETIC) 20-25 MG tablet Take 1 tablet by mouth daily.  . metFORMIN (GLUCOPHAGE) 1000 MG tablet TAKE 1 TABLET TWICE DAILY FOR DIABETES CONTROL  . nebivolol (BYSTOLIC) 10 MG tablet Take 1 tablet (10 mg total) by mouth daily.  Marland Kitchen oxybutynin (DITROPAN-XL) 5 MG 24 hr tablet   . oxyCODONE-acetaminophen (PERCOCET) 7.5-325 MG per tablet Take 1 tablet by mouth every 4 (four) hours as needed.  . simvastatin (ZOCOR) 40 MG tablet Take 1 tablet (40 mg total) by mouth daily.   No facility-administered encounter medications on file as of 10/21/2015.    Activities of Daily Living In your present state of health, do you have any difficulty performing the following activities: 10/21/2015  Hearing? N  Vision? N  Difficulty concentrating or making decisions? N  Walking or climbing stairs? (No Data)  Dressing or bathing? N  Doing errands, shopping? N  Preparing Food and eating ? N  Using the Toilet? N  In the past six months, have you accidently leaked urine? N  Do you have problems with loss of bowel control? N  Managing your Medications? N  Managing your Finances? N  Housekeeping or managing your Housekeeping? N    Patient Care Team: [name], MD as PCP - General (Internal Medicine)    Assessment:     Assessment   Patient presents for yearly preventative medicine examination. Medicare questionnaire screening were completed, i.e. Functional; fall risk; depression, memory loss and hearing all unremarkable  All immunizations and health maintenance protocols were reviewed with the patient. The patient states she did have flu shot at last visit and this was documented today  Education provided for laboratory screens; reviewed chol and A1c which is very good; states she is taking her medicine   Medication reconciliation, past medical history, social history, problem list and  allergies were reviewed in detail with the patient; Allergy to lovastatin but states she is not allergic; thought leg pain was due to myalgia but it was not; is taking without compliant now.  Goals were established with regard to weight loss, [facility] as she has lost weight there before; will start in [place] of life planning was discussed and Advanced Directive reviewed while here at the office; will call for questions and will bring a copy to the office when complete  States she has some itching on her lower forearms, inner aspect and will go to the pharmacy to get cream for "dry skin". Not sure if allergy has not exposed to allergen; will call if it does not subside; Was barely noticeable but "itches"    Exercise Activities and Dietary recommendations Current Exercise Habits:: Exercise is limited by, Limited by:: neurologic condition(s) (back and arthritis )  Goals    . Weight < 300 lb (136.079 kg)  BMI ;48;  reviewed and educated Educated regarding online nutrition programs as [employer]um[employer]nl and http://vang.com/  Plans to go back to [facility] with past success  [facility]   Address: [address] [place], [place] [address]  Phone: [phone]              Fall Risk Fall Risk  10/21/2015 04/20/2015 04/09/2014  Falls in the past year? No No No   Depression Screen PHQ 2/9 Scores 10/21/2015 04/20/2015 04/09/2014  PHQ - 2 Score 0 0 0     Cognitive Testing MMSE - Mini Mental State Exam 10/21/2015  Not completed: (No Data)    Immunization History  Administered Date(s) Administered  . Influenza Split 08/15/2012  . Influenza Whole 07/15/2008, 07/26/2010, 07/19/2011  . Influenza, High Dose Seasonal PF 09/04/2013  . Influenza,inj,Quad PF,36+ Mos 07/11/2014  . Influenza-Unspecified 08/15/2015  . Pneumococcal Conjugate-13 04/08/2014  . Pneumococcal Polysaccharide-23 02/14/2012  .  Td 09/16/2008  . Zoster 07/09/2015   Screening Tests Health Maintenance  Topic Date Due  . DEXA SCAN  05/15/2008  . INFLUENZA VACCINE  06/15/2015  . HEMOGLOBIN A1C  02/23/2016  . OPHTHALMOLOGY EXAM  04/01/2016  . FOOT EXAM  08/24/2016  . URINE MICROALBUMIN  08/24/2016  . COLONOSCOPY  04/29/2017  . MAMMOGRAM  08/27/2017  . TETANUS/TDAP  09/16/2018  . ZOSTAVAX  Addressed  . PNA vac Low Risk Adult  Completed      Plan:   Pl[facility] to [facility] to [facility]    During the course of the visit the patient was educated and counseled about the following appropriate screening and preventive services:   Vaccines to include Pneumoccal, Influenza, Hepatitis B, Td, Zostavax, HCV/ states she had the flu and was documented  Electrocardiogram  Cardiovascular Disease/ HTN; obesity; sedentary due to back pain; but does stay busy and tries to walk as much as possible; Educated regarding weigh loss and to monitor BP which was elevated today  Colorectal cancer screening/ due 04/2017  Bone density screening/ deferred; to discuss with Dr. Ronnald Ramp;   Diabetes screening/ good control; states she is taking her medicine  Glaucoma screening/neg in May / 2016  Mammography/ 08/2015  Nutrition counseling / dicussed the Diabetes and [place] but does not want to go there; Prefers [facility]   Patient Instructions (the written plan) was given to the patient.   HWTUU,EKCMK, RN  10/21/2015  Medical screening examination/treatment/procedure(s) were performed by non-physician practitioner and as supervising physician I was immediately available for consultation/collaboration. I agree with above. Scarlette Calico, MD

## 2015-10-21 NOTE — Patient Instructions (Addendum)
Holly [name] , Thank you for taking time to come for your Medicare Wellness Visit. I appreciate your ongoing commitment to your health goals. Please review the following plan we discussed and let me know if I can assist you in the future.   Will go to Physician Weight loss or other   Did review and will complete ADvanced Directives   These are the goals we discussed: Goals    . Weight < 300 lb (136.079 kg)     BMI ;48;  reviewed and educated Educated regarding online nutrition programs as [email or web address] and [email or web address]  Plans to go back to Physician Weight loss with past success  [facility]   Address: [address], [place], [place] [address]  Phone: [phone]               This is a list of the screening recommended for you and due dates:  Health Maintenance  Topic Date Due  . DEXA scan (bone density measurement)  05/15/2008  . Flu Shot  06/15/2015  . Hemoglobin A1C  02/23/2016  . Eye exam for diabetics  04/01/2016  . Complete foot exam   08/24/2016  . Urine Protein Check  08/24/2016  . Colon Cancer Screening  04/29/2017  . Mammogram  08/27/2017  . Tetanus Vaccine  09/16/2018  . Shingles Vaccine  Addressed  . Pneumonia vaccines  Completed     Fat and Cholesterol Restricted Diet Getting too much fat and cholesterol in your diet may cause health problems. Following this diet helps keep your fat and cholesterol at normal levels. This can keep you from getting sick. WHAT TYPES OF FAT SHOULD I CHOOSE?  Choose monosaturated and polyunsaturated fats. These are found in foods such as olive oil, canola oil, flaxseeds, walnuts, almonds, and seeds.  Eat more omega-3 fats. Good choices include salmon, mackerel, sardines, tuna, flaxseed oil, and ground flaxseeds.  Limit saturated fats. These are in animal products such as meats, butter, and cream. They can also be in plant products such as palm oil, palm kernel oil,  and coconut oil.   Avoid foods with partially hydrogenated oils in them. These contain trans fats. Examples of foods that have trans fats are stick margarine, some tub margarines, cookies, crackers, and other baked goods. WHAT GENERAL GUIDELINES DO I NEED TO FOLLOW?   Check food labels. Look for the words "trans fat" and "saturated fat."  When preparing a meal:  Fill half of your plate with vegetables and green salads.  Fill one fourth of your plate with whole grains. Look for the word "whole" as the first word in the ingredient list.  Fill one fourth of your plate with lean protein foods.  Limit fruit to two servings a day. Choose fruit instead of juice.  Eat more foods with soluble fiber. Examples of foods with this type of fiber are apples, broccoli, carrots, beans, peas, and barley. Try to get 20-30 g (grams) of fiber per day.  Eat more home-cooked foods. Eat less at restaurants and buffets.  Limit or avoid alcohol.  Limit foods high in starch and sugar.  Limit fried foods.  Cook foods without frying them. Baking, boiling, grilling, and broiling are all great options.  Lose weight if you are overweight. Losing even a small amount of weight can help your overall health. It can also help prevent diseases such as diabetes and heart disease. WHAT FOODS CAN I EAT? Grains Whole grains, such as whole wheat  or whole grain breads, crackers, cereals, and pasta. Unsweetened oatmeal, bulgur, barley, quinoa, or brown rice. Corn or whole wheat flour tortillas. Vegetables Fresh or frozen vegetables (raw, steamed, roasted, or grilled). Green salads. Fruits All fresh, canned (in natural juice), or frozen fruits. Meat and Other Protein Products Ground beef (85% or leaner), grass-fed beef, or beef trimmed of fat. Skinless chicken or [place]. Ground chicken or [place]. Pork trimmed of fat. All fish and seafood. Eggs. Dried beans, peas, or lentils. Unsalted nuts or seeds. Unsalted canned or dry  beans. Dairy Low-fat dairy products, such as skim or 1% milk, 2% or reduced-fat cheeses, low-fat ricotta or cottage cheese, or plain low-fat yogurt. Fats and Oils Tub margarines without trans fats. Light or reduced-fat mayonnaise and salad dressings. Avocado. Olive, canola, sesame, or safflower oils. Natural peanut or almond butter (choose ones without added sugar and oil). The items listed above may not be a complete list of recommended foods or beverages. Contact your dietitian for more options. WHAT FOODS ARE NOT RECOMMENDED? Grains White bread. White pasta. White rice. Cornbread. Bagels, pastries, and croissants. Crackers that contain trans fat. Vegetables White potatoes. Corn. Creamed or fried vegetables. Vegetables in a cheese sauce. Fruits Dried fruits. Canned fruit in light or heavy syrup. Fruit juice. Meat and Other Protein Products Fatty cuts of meat. Ribs, chicken wings, bacon, sausage, bologna, salami, chitterlings, fatback, hot dogs, bratwurst, and packaged luncheon meats. Liver and organ meats. Dairy Whole or 2% milk, cream, half-and-half, and cream cheese. Whole milk cheeses. Whole-fat or sweetened yogurt. Full-fat cheeses. Nondairy creamers and whipped toppings. Processed cheese, cheese spreads, or cheese curds. Sweets and Desserts Corn syrup, sugars, honey, and molasses. Candy. Jam and jelly. Syrup. Sweetened cereals. Cookies, pies, cakes, donuts, muffins, and ice cream. Fats and Oils Butter, stick margarine, lard, shortening, ghee, or bacon fat. Coconut, palm kernel, or palm oils. Beverages Alcohol. Sweetened drinks (such as sodas, lemonade, and fruit drinks or punches). The items listed above may not be a complete list of foods and beverages to avoid. Contact your dietitian for more information.   This information is not intended to replace advice given to you by your health care provider. Make sure you discuss any questions you have with your health care provider.     Document Released: 05/01/2012 Document Revised: 11/21/2014 Document Reviewed: 01/30/2014 Elsevier Interactive Patient Education 2016 [place] in the Home  Falls can cause injuries. They can happen to people of all ages. There are many things you can do to make your home safe and to help prevent falls.  WHAT CAN I DO ON THE OUTSIDE OF MY HOME?  Regularly fix the edges of walkways and driveways and fix any cracks.  Remove anything that might make you trip as you walk through a door, such as a raised step or threshold.  Trim any bushes or trees on the path to your home.  Use bright outdoor lighting.  Clear any walking paths of anything that might make someone trip, such as rocks or tools.  Regularly check to see if handrails are loose or broken. Make sure that both sides of any steps have handrails.  Any raised decks and porches should have guardrails on the edges.  Have any leaves, snow, or ice cleared regularly.  Use sand or salt on walking paths during winter.  Clean up any spills in your garage right away. This includes oil or grease spills. WHAT CAN I DO IN THE BATHROOM?   Use night  lights.  Install grab bars by the toilet and in the tub and shower. Do not use towel bars as grab bars.  Use non-skid mats or decals in the tub or shower.  If you need to sit down in the shower, use a plastic, non-slip stool.  Keep the floor dry. Clean up any water that spills on the floor as soon as it happens.  Remove soap buildup in the tub or shower regularly.  Attach bath mats securely with double-sided non-slip rug tape.  Do not have throw rugs and other things on the floor that can make you trip. WHAT CAN I DO IN THE BEDROOM?  Use night lights.  Make sure that you have a light by your bed that is easy to reach.  Do not use any sheets or blankets that are too big for your bed. They should not hang down onto the floor.  Have a firm chair that has side  arms. You can use this for support while you get dressed.  Do not have throw rugs and other things on the floor that can make you trip. WHAT CAN I DO IN THE KITCHEN?  Clean up any spills right away.  Avoid walking on wet floors.  Keep items that you use a lot in easy-to-reach places.  If you need to reach something above you, use a strong step stool that has a grab bar.  Keep electrical cords out of the way.  Do not use floor polish or wax that makes floors slippery. If you must use wax, use non-skid floor wax.  Do not have throw rugs and other things on the floor that can make you trip. WHAT CAN I DO WITH MY STAIRS?  Do not leave any items on the stairs.  Make sure that there are handrails on both sides of the stairs and use them. Fix handrails that are broken or loose. Make sure that handrails are as long as the stairways.  Check any carpeting to make sure that it is firmly attached to the stairs. Fix any carpet that is loose or worn.  Avoid having throw rugs at the top or bottom of the stairs. If you do have throw rugs, attach them to the floor with carpet tape.  Make sure that you have a light switch at the top of the stairs and the bottom of the stairs. If you do not have them, ask someone to add them for you. WHAT ELSE CAN I DO TO HELP PREVENT FALLS?  Wear shoes that:  Do not have high heels.  Have rubber bottoms.  Are comfortable and fit you well.  Are closed at the toe. Do not wear sandals.  If you use a stepladder:  Make sure that it is fully opened. Do not climb a closed stepladder.  Make sure that both sides of the stepladder are locked into place.  Ask someone to hold it for you, if possible.  Clearly mark and make sure that you can see:  Any grab bars or handrails.  First and last steps.  Where the edge of each step is.  Use tools that help you move around (mobility aids) if they are needed. These  include:  Canes.  Walkers.  Scooters.  Crutches.  Turn on the lights when you go into a dark area. Replace any light bulbs as soon as they burn out.  Set up your furniture so you have a clear path. Avoid moving your furniture around.  If any of your  floors are uneven, fix them.  If there are any pets around you, be aware of where they are.  Review your medicines with your doctor. Some medicines can make you feel dizzy. This can increase your chance of falling. Ask your doctor what other things that you can do to help prevent falls.   This information is not intended to replace advice given to you by your health care provider. Make sure you discuss any questions you have with your health care provider.   Document Released: 08/27/2009 Document Revised: 03/17/2015 Document Reviewed: 12/05/2014 Elsevier Interactive Patient Education 2016 [place] Maintenance, Female Adopting a healthy lifestyle and getting preventive care can go a long way to promote health and wellness. Talk with your health care provider about what schedule of regular examinations is right for you. This is a good chance for you to check in with your provider about disease prevention and staying healthy. In between checkups, there are plenty of things you can do on your own. Experts have done a lot of research about which lifestyle changes and preventive measures are most likely to keep you healthy. Ask your health care provider for more information. WEIGHT AND DIET  Eat a healthy diet  Be sure to include plenty of vegetables, fruits, low-fat dairy products, and lean protein.  Do not eat a lot of foods high in solid fats, added sugars, or salt.  Get regular exercise. This is one of the most important things you can do for your health.  Most adults should exercise for at least 150 minutes each week. The exercise should increase your heart rate and make you sweat (moderate-intensity exercise).  Most  adults should also do strengthening exercises at least twice a week. This is in addition to the moderate-intensity exercise.  Maintain a healthy weight  Body mass index (BMI) is a measurement that can be used to identify possible weight problems. It estimates body fat based on height and weight. Your health care provider can help determine your BMI and help you achieve or maintain a healthy weight.  For females 72 years of age and older:   A BMI below 18.5 is considered underweight.  A BMI of 18.5 to 24.9 is normal.  A BMI of 25 to 29.9 is considered overweight.  A BMI of 30 and above is considered obese.  Watch levels of cholesterol and blood lipids  You should start having your blood tested for lipids and cholesterol at 72 years of age, then have this test every 5 years.  You may need to have your cholesterol levels checked more often if:  Your lipid or cholesterol levels are high.  You are older than 72 years of age.  You are at high risk for heart disease.  CANCER SCREENING   Lung Cancer  Lung cancer screening is recommended for adults 35-57 years old who are at high risk for lung cancer because of a history of smoking.  A yearly low-dose CT scan of the lungs is recommended for people who:  Currently smoke.  Have quit within the past 15 years.  Have at least a 30-pack-year history of smoking. A pack year is smoking an average of one pack of cigarettes a day for 1 year.  Yearly screening should continue until it has been 15 years since you quit.  Yearly screening should stop if you develop a health problem that would prevent you from having lung cancer treatment.  Breast Cancer  Practice breast self-awareness. This means  understanding how your breasts normally appear and feel.  It also means doing regular breast self-exams. Let your health care provider know about any changes, no matter how small.  If you are in your 20s or 30s, you should have a clinical  breast exam (CBE) by a health care provider every 1-3 years as part of a regular health exam.  If you are 58 or older, have a CBE every year. Also consider having a breast X-ray (mammogram) every year.  If you have a family history of breast cancer, talk to your health care provider about genetic screening.  If you are at high risk for breast cancer, talk to your health care provider about having an MRI and a mammogram every year.  Breast cancer gene (BRCA) assessment is recommended for women who have family members with BRCA-related cancers. BRCA-related cancers include:  Breast.  Ovarian.  Tubal.  Peritoneal cancers.  Results of the assessment will determine the need for genetic counseling and BRCA1 and BRCA2 testing. Cervical Cancer Your health care provider may recommend that you be screened regularly for cancer of the pelvic organs (ovaries, uterus, and vagina). This screening involves a pelvic examination, including checking for microscopic changes to the surface of your cervix (Pap test). You may be encouraged to have this screening done every 3 years, beginning at age 31.  For women ages 50-65, health care providers may recommend pelvic exams and Pap testing every 3 years, or they may recommend the Pap and pelvic exam, combined with testing for human papilloma virus (HPV), every 5 years. Some types of HPV increase your risk of cervical cancer. Testing for HPV may also be done on women of any age with unclear Pap test results.  Other health care providers may not recommend any screening for nonpregnant women who are considered low risk for pelvic cancer and who do not have symptoms. Ask your health care provider if a screening pelvic exam is right for you.  If you have had past treatment for cervical cancer or a condition that could lead to cancer, you need Pap tests and screening for cancer for at least 20 years after your treatment. If Pap tests have been discontinued, your risk  factors (such as having a new sexual partner) need to be reassessed to determine if screening should resume. Some women have medical problems that increase the chance of getting cervical cancer. In these cases, your health care provider may recommend more frequent screening and Pap tests. Colorectal Cancer  This type of cancer can be detected and often prevented.  Routine colorectal cancer screening usually begins at 72 years of age and continues through 72 years of age.  Your health care provider may recommend screening at an earlier age if you have risk factors for colon cancer.  Your health care provider may also recommend using home test kits to check for hidden blood in the stool.  A small camera at the end of a tube can be used to examine your colon directly (sigmoidoscopy or colonoscopy). This is done to check for the earliest forms of colorectal cancer.  Routine screening usually begins at age 37.  Direct examination of the colon should be repeated every 5-10 years through 72 years of age. However, you may need to be screened more often if early forms of precancerous polyps or small growths are found. Skin Cancer  Check your skin from head to toe regularly.  Tell your health care provider about any new moles or changes in  moles, especially if there is a change in a mole's shape or color.  Also tell your health care provider if you have a mole that is larger than the size of a pencil eraser.  Always use sunscreen. Apply sunscreen liberally and repeatedly throughout the day.  Protect yourself by wearing long sleeves, pants, a wide-brimmed hat, and sunglasses whenever you are outside. HEART DISEASE, DIABETES, AND HIGH BLOOD PRESSURE   High blood pressure causes heart disease and increases the risk of stroke. High blood pressure is more likely to develop in:  People who have blood pressure in the high end of the normal range (130-139/85-89 mm Hg).  People who are overweight or  obese.  People who are African American.  If you are 10-86 years of age, have your blood pressure checked every 3-5 years. If you are 36 years of age or older, have your blood pressure checked every year. You should have your blood pressure measured twice--once when you are at a hospital or clinic, and once when you are not at a hospital or clinic. Record the average of the two measurements. To check your blood pressure when you are not at a hospital or clinic, you can use:  An automated blood pressure machine at a pharmacy.  A home blood pressure monitor.  If you are between 39 years and 37 years old, ask your health care provider if you should take aspirin to prevent strokes.  Have regular diabetes screenings. This involves taking a blood sample to check your fasting blood sugar level.  If you are at a normal weight and have a low risk for diabetes, have this test once every three years after 72 years of age.  If you are overweight and have a high risk for diabetes, consider being tested at a younger age or more often. PREVENTING INFECTION  Hepatitis B  If you have a higher risk for hepatitis B, you should be screened for this virus. You are considered at high risk for hepatitis B if:  You were born in a country where hepatitis B is common. Ask your health care provider which countries are considered high risk.  Your parents were born in a high-risk country, and you have not been immunized against hepatitis B (hepatitis B vaccine).  You have HIV or AIDS.  You use needles to inject street drugs.  You live with someone who has hepatitis B.  You have had sex with someone who has hepatitis B.  You get hemodialysis treatment.  You take certain medicines for conditions, including cancer, organ transplantation, and autoimmune conditions. Hepatitis C  Blood testing is recommended for:  Everyone born from [birth year] through 1963.  Anyone with known risk factors for hepatitis  C. Sexually transmitted infections (STIs)  You should be screened for sexually transmitted infections (STIs) including gonorrhea and chlamydia if:  You are sexually active and are younger than 72 years of age.  You are older than 72 years of age and your health care provider tells you that you are at risk for this type of infection.  Your sexual activity has changed since you were last screened and you are at an increased risk for chlamydia or gonorrhea. Ask your health care provider if you are at risk.  If you do not have HIV, but are at risk, it may be recommended that you take a prescription medicine daily to prevent HIV infection. This is called pre-exposure prophylaxis (PrEP). You are considered at risk if:  You are sexually  active and do not regularly use condoms or know the HIV status of your partner(s).  You take drugs by injection.  You are sexually active with a partner who has HIV. Talk with your health care provider about whether you are at high risk of being infected with HIV. If you choose to begin PrEP, you should first be tested for HIV. You should then be tested every 3 months for as long as you are taking PrEP.  PREGNANCY   If you are premenopausal and you may become pregnant, ask your health care provider about preconception counseling.  If you may become pregnant, take 400 to 800 micrograms (mcg) of folic acid every day.  If you want to prevent pregnancy, talk to your health care provider about birth control (contraception). OSTEOPOROSIS AND MENOPAUSE   Osteoporosis is a disease in which the bones lose minerals and strength with aging. This can result in serious bone fractures. Your risk for osteoporosis can be identified using a bone density scan.  If you are 32 years of age or older, or if you are at risk for osteoporosis and fractures, ask your health care provider if you should be screened.  Ask your health care provider whether you should take a calcium or  vitamin D supplement to lower your risk for osteoporosis.  Menopause may have certain physical symptoms and risks.  Hormone replacement therapy may reduce some of these symptoms and risks. Talk to your health care provider about whether hormone replacement therapy is right for you.  HOME CARE INSTRUCTIONS   Schedule regular health, dental, and eye exams.  Stay current with your immunizations.   Do not use any tobacco products including cigarettes, chewing tobacco, or electronic cigarettes.  If you are pregnant, do not drink alcohol.  If you are breastfeeding, limit how much and how often you drink alcohol.  Limit alcohol intake to no more than 1 drink per day for nonpregnant women. One drink equals 12 ounces of beer, 5 ounces of wine, or 1 ounces of hard liquor.  Do not use street drugs.  Do not share needles.  Ask your health care provider for help if you need support or information about quitting drugs.  Tell your health care provider if you often feel depressed.  Tell your health care provider if you have ever been abused or do not feel safe at home.   This information is not intended to replace advice given to you by your health care provider. Make sure you discuss any questions you have with your health care provider.   Document Released: 05/16/2011 Document Revised: 11/21/2014 Document Reviewed: 10/02/2013 Elsevier Interactive Patient Education [employer].

## 2015-12-01 DIAGNOSIS — M9903 Segmental and somatic dysfunction of lumbar region: Secondary | ICD-10-CM | POA: Diagnosis not present

## 2015-12-01 DIAGNOSIS — M9902 Segmental and somatic dysfunction of thoracic region: Secondary | ICD-10-CM | POA: Diagnosis not present

## 2015-12-01 DIAGNOSIS — M5415 Radiculopathy, thoracolumbar region: Secondary | ICD-10-CM | POA: Diagnosis not present

## 2015-12-01 DIAGNOSIS — M5417 Radiculopathy, lumbosacral region: Secondary | ICD-10-CM | POA: Diagnosis not present

## 2015-12-01 DIAGNOSIS — M5432 Sciatica, left side: Secondary | ICD-10-CM | POA: Diagnosis not present

## 2015-12-01 DIAGNOSIS — M9904 Segmental and somatic dysfunction of sacral region: Secondary | ICD-10-CM | POA: Diagnosis not present

## 2015-12-10 DIAGNOSIS — M9902 Segmental and somatic dysfunction of thoracic region: Secondary | ICD-10-CM | POA: Diagnosis not present

## 2015-12-10 DIAGNOSIS — M5432 Sciatica, left side: Secondary | ICD-10-CM | POA: Diagnosis not present

## 2015-12-10 DIAGNOSIS — M9904 Segmental and somatic dysfunction of sacral region: Secondary | ICD-10-CM | POA: Diagnosis not present

## 2015-12-10 DIAGNOSIS — M5415 Radiculopathy, thoracolumbar region: Secondary | ICD-10-CM | POA: Diagnosis not present

## 2015-12-10 DIAGNOSIS — M5417 Radiculopathy, lumbosacral region: Secondary | ICD-10-CM | POA: Diagnosis not present

## 2015-12-10 DIAGNOSIS — M9903 Segmental and somatic dysfunction of lumbar region: Secondary | ICD-10-CM | POA: Diagnosis not present

## 2015-12-15 DIAGNOSIS — M5417 Radiculopathy, lumbosacral region: Secondary | ICD-10-CM | POA: Diagnosis not present

## 2015-12-15 DIAGNOSIS — M9902 Segmental and somatic dysfunction of thoracic region: Secondary | ICD-10-CM | POA: Diagnosis not present

## 2015-12-15 DIAGNOSIS — M9904 Segmental and somatic dysfunction of sacral region: Secondary | ICD-10-CM | POA: Diagnosis not present

## 2015-12-15 DIAGNOSIS — M9903 Segmental and somatic dysfunction of lumbar region: Secondary | ICD-10-CM | POA: Diagnosis not present

## 2015-12-15 DIAGNOSIS — M5415 Radiculopathy, thoracolumbar region: Secondary | ICD-10-CM | POA: Diagnosis not present

## 2015-12-15 DIAGNOSIS — M5432 Sciatica, left side: Secondary | ICD-10-CM | POA: Diagnosis not present

## 2015-12-22 DIAGNOSIS — M5417 Radiculopathy, lumbosacral region: Secondary | ICD-10-CM | POA: Diagnosis not present

## 2015-12-22 DIAGNOSIS — M9903 Segmental and somatic dysfunction of lumbar region: Secondary | ICD-10-CM | POA: Diagnosis not present

## 2015-12-22 DIAGNOSIS — M5432 Sciatica, left side: Secondary | ICD-10-CM | POA: Diagnosis not present

## 2015-12-22 DIAGNOSIS — M9904 Segmental and somatic dysfunction of sacral region: Secondary | ICD-10-CM | POA: Diagnosis not present

## 2015-12-22 DIAGNOSIS — M5415 Radiculopathy, thoracolumbar region: Secondary | ICD-10-CM | POA: Diagnosis not present

## 2015-12-22 DIAGNOSIS — M9902 Segmental and somatic dysfunction of thoracic region: Secondary | ICD-10-CM | POA: Diagnosis not present

## 2015-12-28 ENCOUNTER — Ambulatory Visit (INDEPENDENT_AMBULATORY_CARE_PROVIDER_SITE_OTHER): Payer: Medicare PPO | Admitting: Internal Medicine

## 2015-12-28 ENCOUNTER — Other Ambulatory Visit (INDEPENDENT_AMBULATORY_CARE_PROVIDER_SITE_OTHER): Payer: Medicare PPO

## 2015-12-28 ENCOUNTER — Encounter: Payer: Self-pay | Admitting: Internal Medicine

## 2015-12-28 VITALS — BP 152/86 | HR 70 | Temp 97.9°F | Resp 16 | Ht 69.5 in | Wt 327.0 lb

## 2015-12-28 DIAGNOSIS — I10 Essential (primary) hypertension: Secondary | ICD-10-CM

## 2015-12-28 DIAGNOSIS — E118 Type 2 diabetes mellitus with unspecified complications: Secondary | ICD-10-CM | POA: Diagnosis not present

## 2015-12-28 DIAGNOSIS — E2839 Other primary ovarian failure: Secondary | ICD-10-CM | POA: Diagnosis not present

## 2015-12-28 DIAGNOSIS — Z1211 Encounter for screening for malignant neoplasm of colon: Secondary | ICD-10-CM

## 2015-12-28 DIAGNOSIS — N183 Chronic kidney disease, stage 3 unspecified: Secondary | ICD-10-CM

## 2015-12-28 DIAGNOSIS — I70211 Atherosclerosis of native arteries of extremities with intermittent claudication, right leg: Secondary | ICD-10-CM | POA: Diagnosis not present

## 2015-12-28 LAB — CBC WITH DIFFERENTIAL/PLATELET
BASOS ABS: 0 10*3/uL (ref 0.0–0.1)
Basophils Relative: 0.7 % (ref 0.0–3.0)
EOS ABS: 0.1 10*3/uL (ref 0.0–0.7)
Eosinophils Relative: 2.2 % (ref 0.0–5.0)
HCT: 42.4 % (ref 36.0–46.0)
Hemoglobin: 13.8 g/dL (ref 12.0–15.0)
LYMPHS ABS: 2.1 10*3/uL (ref 0.7–4.0)
Lymphocytes Relative: 31.6 % (ref 12.0–46.0)
MCHC: 32.4 g/dL (ref 30.0–36.0)
MCV: 84.6 fl (ref 78.0–100.0)
MONO ABS: 0.5 10*3/uL (ref 0.1–1.0)
Monocytes Relative: 7.9 % (ref 3.0–12.0)
NEUTROS ABS: 3.8 10*3/uL (ref 1.4–7.7)
NEUTROS PCT: 57.6 % (ref 43.0–77.0)
PLATELETS: 224 10*3/uL (ref 150.0–400.0)
RBC: 5.02 Mil/uL (ref 3.87–5.11)
RDW: 13.6 % (ref 11.5–15.5)
WBC: 6.5 10*3/uL (ref 4.0–10.5)

## 2015-12-28 LAB — BASIC METABOLIC PANEL
BUN: 23 mg/dL (ref 6–23)
CALCIUM: 9.9 mg/dL (ref 8.4–10.5)
CO2: 29 meq/L (ref 19–32)
CREATININE: 1.06 mg/dL (ref 0.40–1.20)
Chloride: 103 mEq/L (ref 96–112)
GFR: 65.42 mL/min (ref >60.00)
GLUCOSE: 164 mg/dL — AB (ref 70–99)
Potassium: 4 mEq/L (ref 3.5–5.1)
SODIUM: 139 meq/L (ref 135–145)

## 2015-12-28 LAB — HEMOGLOBIN A1C: HEMOGLOBIN A1C: 7.6 % — AB (ref 4.6–6.5)

## 2015-12-28 MED ORDER — VALSARTAN-HYDROCHLOROTHIAZIDE 320-25 MG PO TABS: 1.0000 | ORAL_TABLET | Freq: Every day | ORAL | Status: DC

## 2015-12-28 NOTE — Progress Notes (Signed)
Pre visit review using our clinic review tool, if applicable. No additional management support is needed unless otherwise documented below in the visit note. 

## 2015-12-28 NOTE — Patient Instructions (Signed)
Hypertension Hypertension, commonly called high blood pressure, is when the force of blood pumping through your arteries is too strong. Your arteries are the blood vessels that carry blood from your heart throughout your body. A blood pressure reading consists of a higher number over a lower number, such as 110/72. The higher number (systolic) is the pressure inside your arteries when your heart pumps. The lower number (diastolic) is the pressure inside your arteries when your heart relaxes. Ideally you want your blood pressure below 120/80. Hypertension forces your heart to work harder to pump blood. Your arteries may become narrow or stiff. Having untreated or uncontrolled hypertension can cause heart attack, stroke, kidney disease, and other problems. RISK FACTORS Some risk factors for high blood pressure are controllable. Others are not.  Risk factors you cannot control include:   Race. You may be at higher risk if you are African American.  Age. Risk increases with age.  Gender. Men are at higher risk than women before age 45 years. After age 65, women are at higher risk than men. Risk factors you can control include:  Not getting enough exercise or physical activity.  Being overweight.  Getting too much fat, sugar, calories, or salt in your diet.  Drinking too much alcohol. SIGNS AND SYMPTOMS Hypertension does not usually cause signs or symptoms. Extremely high blood pressure (hypertensive crisis) may cause headache, anxiety, shortness of breath, and nosebleed. DIAGNOSIS To check if you have hypertension, your health care provider will measure your blood pressure while you are seated, with your arm held at the level of your heart. It should be measured at least twice using the same arm. Certain conditions can cause a difference in blood pressure between your right and left arms. A blood pressure reading that is higher than normal on one occasion does not mean that you need treatment. If  it is not clear whether you have high blood pressure, you may be asked to return on a different day to have your blood pressure checked again. Or, you may be asked to monitor your blood pressure at home for 1 or more weeks. TREATMENT Treating high blood pressure includes making lifestyle changes and possibly taking medicine. Living a healthy lifestyle can help lower high blood pressure. You may need to change some of your habits. Lifestyle changes may include:  Following the DASH diet. This diet is high in fruits, vegetables, and whole grains. It is low in salt, red meat, and added sugars.  Keep your sodium intake below 2,300 mg per day.  Getting at least 30-45 minutes of aerobic exercise at least 4 times per week.  Losing weight if necessary.  Not smoking.  Limiting alcoholic beverages.  Learning ways to reduce stress. Your health care provider may prescribe medicine if lifestyle changes are not enough to get your blood pressure under control, and if one of the following is true:  You are 18-59 years of age and your systolic blood pressure is above 140.  You are 60 years of age or older, and your systolic blood pressure is above 150.  Your diastolic blood pressure is above 90.  You have diabetes, and your systolic blood pressure is over 140 or your diastolic blood pressure is over 90.  You have kidney disease and your blood pressure is above 140/90.  You have heart disease and your blood pressure is above 140/90. Your personal target blood pressure may vary depending on your medical conditions, your age, and other factors. HOME CARE INSTRUCTIONS    Have your blood pressure rechecked as directed by your health care provider.   Take medicines only as directed by your health care provider. Follow the directions carefully. Blood pressure medicines must be taken as prescribed. The medicine does not work as well when you skip doses. Skipping doses also puts you at risk for  problems.  Do not smoke.   Monitor your blood pressure at home as directed by your health care provider. SEEK MEDICAL CARE IF:   You think you are having a reaction to medicines taken.  You have recurrent headaches or feel dizzy.  You have swelling in your ankles.  You have trouble with your vision. SEEK IMMEDIATE MEDICAL CARE IF:  You develop a severe headache or confusion.  You have unusual weakness, numbness, or feel faint.  You have severe chest or abdominal pain.  You vomit repeatedly.  You have trouble breathing. MAKE SURE YOU:   Understand these instructions.  Will watch your condition.  Will get help right away if you are not doing well or get worse.   This information is not intended to replace advice given to you by your health care provider. Make sure you discuss any questions you have with your health care provider.   Document Released: 10/31/2005 Document Revised: 03/17/2015 Document Reviewed: 08/23/2013 Elsevier Interactive Patient Education 2016 [employer]  

## 2015-12-28 NOTE — Progress Notes (Signed)
Subjective:  Patient ID: Holly [name], female    DOB: 21-Jun-1941  Age: 75 y.o. MRN: [id number]  CC: Hypertension and Diabetes   HPI Holly [name] presents for follow-up on hypertension and diabetes. She complains that her blood pressure has not been well controlled with a combination of lisinopril, hydrochlorothiazide, and bystolic. She does not have any symptoms related to hypertension. She is frustrated that she has not lost any weight. Her blood sugars have been mostly well-controlled with the highest that she is recorded in the last 2 weeks it about 170.  Outpatient Prescriptions Prior to Visit  Medication Sig Dispense Refill  . ACCU-CHEK FASTCLIX LANCETS MISC Use as directed to test blood sugars Dx: 250.00 102 each 12  . aspirin 81 MG EC tablet Take 81 mg by mouth daily.      . Blood Glucose Monitoring Suppl (ACCU-CHEK NANO SMARTVIEW) W/DEVICE KIT Use as directed to test blood sugars Dx: 250.00 1 kit 0  . cetirizine (ZYRTEC ALLERGY) 10 MG tablet Take 10 mg by mouth daily.      . Cholecalciferol (VITAMIN D) 400 UNIT capsule Take 400 Units by mouth daily.      Marland Kitchen gabapentin (NEURONTIN) 300 MG capsule nightly (Patient taking differently: 300 mg 3 (three) times daily. nightly) 30 capsule 3  . glucose blood (ACCU-CHEK SMARTVIEW) test strip Use as instructed to test blood sugars Dx: 250.00 100 each 12  . metFORMIN (GLUCOPHAGE) 1000 MG tablet TAKE 1 TABLET TWICE DAILY FOR DIABETES CONTROL 180 tablet 3  . nebivolol (BYSTOLIC) 10 MG tablet Take 1 tablet (10 mg total) by mouth daily. 90 tablet 3  . oxybutynin (DITROPAN-XL) 5 MG 24 hr tablet     . oxyCODONE-acetaminophen (PERCOCET) 7.5-325 MG per tablet Take 1 tablet by mouth every 4 (four) hours as needed. 50 tablet 0  . simvastatin (ZOCOR) 40 MG tablet Take 1 tablet (40 mg total) by mouth daily. 90 tablet 3  . lisinopril-hydrochlorothiazide (PRINZIDE,ZESTORETIC) 20-25 MG tablet Take 1 tablet by mouth daily. 90 tablet 1   No  facility-administered medications prior to visit.    ROS Review of Systems  Constitutional: Negative.  Negative for fever, chills, diaphoresis, appetite change and fatigue.  HENT: Negative.   Eyes: Negative.   Respiratory: Negative.  Negative for cough, choking, chest tightness, shortness of breath and stridor.   Cardiovascular: Negative.  Negative for chest pain, palpitations and leg swelling.  Gastrointestinal: Negative.  Negative for nausea, vomiting, abdominal pain, diarrhea, constipation and blood in stool.  Endocrine: Negative.  Negative for polydipsia, polyphagia and polyuria.  Genitourinary: Negative.  Negative for difficulty urinating.  Musculoskeletal: Positive for arthralgias. Negative for myalgias, back pain, joint swelling, gait problem and neck pain.  Skin: Negative.  Negative for color change and rash.  Allergic/Immunologic: Negative.   Neurological: Negative.  Negative for dizziness, tremors, syncope, weakness, light-headedness and numbness.  Hematological: Negative.  Negative for adenopathy. Does not bruise/bleed easily.  Psychiatric/Behavioral: Negative.     Objective:  BP 152/86 mmHg  Pulse 70  Temp(Src) 97.9 F (36.6 C) (Oral)  Resp 16  Ht 5' 9.5" (1.765 m)  Wt 327 lb (148.326 kg)  BMI 47.61 kg/m2  SpO2 97%  BP Readings from Last 3 Encounters:  12/28/15 152/86  10/21/15 155/80  08/25/15 134/88    Wt Readings from Last 3 Encounters:  12/28/15 327 lb (148.326 kg)  10/21/15 327 lb (148.326 kg)  08/25/15 319 lb (144.697 kg)    Physical Exam  Constitutional: She is  oriented to person, place, and time. No distress.  HENT:  Mouth/Throat: Oropharynx is clear and moist. No oropharyngeal exudate.  Eyes: Conjunctivae are normal. Right eye exhibits no discharge. Left eye exhibits no discharge. No scleral icterus.  Neck: Normal range of motion. Neck supple. No JVD present. No tracheal deviation present. No thyromegaly present.  Cardiovascular: Normal rate,  regular rhythm, normal heart sounds and intact distal pulses.  Exam reveals no gallop and no friction rub.   No murmur heard. Pulses:      Carotid pulses are 1+ on the right side, and 1+ on the left side.      Radial pulses are 1+ on the right side, and 1+ on the left side.       Femoral pulses are 1+ on the right side, and 1+ on the left side.      Popliteal pulses are 1+ on the right side, and 1+ on the left side.       Dorsalis pedis pulses are 1+ on the right side, and 1+ on the left side.       Posterior tibial pulses are 1+ on the right side, and 1+ on the left side.  Pulmonary/Chest: Effort normal and breath sounds normal. No stridor. No respiratory distress. She has no wheezes. She has no rales. She exhibits no tenderness.  Abdominal: Soft. Bowel sounds are normal. She exhibits no distension and no mass. There is no tenderness. There is no rebound and no guarding.  Musculoskeletal: Normal range of motion. She exhibits no edema or tenderness.  Lymphadenopathy:    She has no cervical adenopathy.  Neurological: She is oriented to person, place, and time.  Skin: Skin is warm and dry. No rash noted. She is not diaphoretic. No erythema. No pallor.  Psychiatric: She has a normal mood and affect. Her behavior is normal. Judgment and thought content normal.  Vitals reviewed.   Lab Results  Component Value Date   WBC 7.5 11/10/2014   HGB 13.5 11/10/2014   HCT 42.1 11/10/2014   PLT 256.0 11/10/2014   GLUCOSE 139* 08/25/2015   CHOL 205* 04/20/2015   TRIG 128.0 04/20/2015   HDL 73.90 04/20/2015   LDLDIRECT 134.8 08/27/2010   LDLCALC 106* 04/20/2015   ALT 19 11/05/2013   AST 17 11/05/2013   NA 138 08/25/2015   K 4.1 08/25/2015   CL 101 08/25/2015   CREATININE 1.12 08/25/2015   BUN 26* 08/25/2015   CO2 26 08/25/2015   TSH 3.56 04/20/2015   HGBA1C 6.9* 08/25/2015   MICROALBUR 50.3* 08/25/2015    Mm Screening Breast Tomo Bilateral  08/28/2015  CLINICAL DATA:  Screening. EXAM:  DIGITAL SCREENING BILATERAL MAMMOGRAM WITH 3D TOMO WITH CAD COMPARISON:  Previous exam(s). ACR Breast Density Category b: There are scattered areas of fibroglandular density. FINDINGS: There are no findings suspicious for malignancy. Images were processed with CAD. IMPRESSION: No mammographic evidence of malignancy. A result letter of this screening mammogram will be mailed directly to the patient. RECOMMENDATION: Screening mammogram in one year. (Code:SM-B-01Y) BI-RADS CATEGORY  1: Negative. Electronically Signed   By: Ammie Ferrier M.D.   On: 08/28/2015 17:25    Assessment & Plan:   Andreyah was seen today for hypertension and diabetes.  Diagnoses and all orders for this visit:  Estrogen deficiency- she is due for a bone mineral density -     DG Bone Density; Future  Atherosclerosis of native artery of right lower extremity with intermittent claudication ([place])- she has had no  recent episodes of claudication and her pulses are normal today  Essential hypertension- her blood pressure is not adequately well controlled, I have asked her to stop taking the ACE inhibitor as I think an ARB/thiazide diuretic combination would be a more effective antihypertensive. Her electrolytes and renal function are stable. -     Basic metabolic panel; Future -     CBC with Differential/Platelet; Future -     valsartan-hydrochlorothiazide (DIOVAN-HCT) 320-25 MG tablet; Take 1 tablet by mouth daily.  Type 2 diabetes mellitus with complication, without long-term current use of insulin ([place])- her blood sugars are adequately well controlled, she will continue to work on her lifestyle modifications. -     Basic metabolic panel; Future -     CBC with Differential/Platelet; Future -     Hemoglobin A1c; Future -     valsartan-hydrochlorothiazide (DIOVAN-HCT) 320-25 MG tablet; Take 1 tablet by mouth daily.  Kidney disease, chronic, stage III (GFR 30-59 ml/min)- her renal function is stable but she does need better  blood pressure control, will change the ACE inhibitor to an ARB, she agrees to avoid nephrotoxic agents. -     valsartan-hydrochlorothiazide (DIOVAN-HCT) 320-25 MG tablet; Take 1 tablet by mouth daily.  I have discontinued Ms. Loscalzo's lisinopril-hydrochlorothiazide. I am also having her start on valsartan-hydrochlorothiazide. Additionally, I am having her maintain her aspirin, Vitamin D, cetirizine, ACCU-CHEK NANO SMARTVIEW, glucose blood, ACCU-CHEK FASTCLIX LANCETS, oxybutynin, gabapentin, nebivolol, oxyCODONE-acetaminophen, metFORMIN, and simvastatin.  Meds ordered this encounter  Medications  . valsartan-hydrochlorothiazide (DIOVAN-HCT) 320-25 MG tablet    Sig: Take 1 tablet by mouth daily.    Dispense:  90 tablet    Refill:  1     Follow-up: No Follow-up on file.  Scarlette Calico, MD

## 2015-12-29 DIAGNOSIS — M5432 Sciatica, left side: Secondary | ICD-10-CM | POA: Diagnosis not present

## 2015-12-29 DIAGNOSIS — M9903 Segmental and somatic dysfunction of lumbar region: Secondary | ICD-10-CM | POA: Diagnosis not present

## 2015-12-29 DIAGNOSIS — M9902 Segmental and somatic dysfunction of thoracic region: Secondary | ICD-10-CM | POA: Diagnosis not present

## 2015-12-29 DIAGNOSIS — M9904 Segmental and somatic dysfunction of sacral region: Secondary | ICD-10-CM | POA: Diagnosis not present

## 2015-12-29 DIAGNOSIS — M5415 Radiculopathy, thoracolumbar region: Secondary | ICD-10-CM | POA: Diagnosis not present

## 2015-12-29 DIAGNOSIS — M5417 Radiculopathy, lumbosacral region: Secondary | ICD-10-CM | POA: Diagnosis not present

## 2015-12-30 ENCOUNTER — Telehealth: Payer: Self-pay | Admitting: Internal Medicine

## 2015-12-30 NOTE — Telephone Encounter (Signed)
Patient called to inquire on how to proceed for DEXA scan. After chart review, patient exceeds 300 lb weight limit for the table downstairs. Patient is seeking clarification as to where we will send her for her bone density. Please call patient @ # attached

## 2016-01-07 NOTE — Telephone Encounter (Signed)
Pt can go to the breast center of [facility]. Attempted to call pt to inform her, however, none of the 3 phone numbers we have listed were working #s.

## 2016-01-12 DIAGNOSIS — M5415 Radiculopathy, thoracolumbar region: Secondary | ICD-10-CM | POA: Diagnosis not present

## 2016-01-12 DIAGNOSIS — M5417 Radiculopathy, lumbosacral region: Secondary | ICD-10-CM | POA: Diagnosis not present

## 2016-01-12 DIAGNOSIS — M9903 Segmental and somatic dysfunction of lumbar region: Secondary | ICD-10-CM | POA: Diagnosis not present

## 2016-01-12 DIAGNOSIS — M9902 Segmental and somatic dysfunction of thoracic region: Secondary | ICD-10-CM | POA: Diagnosis not present

## 2016-01-12 DIAGNOSIS — M5432 Sciatica, left side: Secondary | ICD-10-CM | POA: Diagnosis not present

## 2016-01-12 DIAGNOSIS — M9904 Segmental and somatic dysfunction of sacral region: Secondary | ICD-10-CM | POA: Diagnosis not present

## 2016-01-13 NOTE — Telephone Encounter (Signed)
Left message for patient to call office.  

## 2016-01-13 NOTE — Telephone Encounter (Signed)
Patient called back in to follow up. States that the [phone] # is correct.  She states that she would prefer [facility].

## 2016-01-20 ENCOUNTER — Ambulatory Visit
Admission: RE | Admit: 2016-01-20 | Discharge: 2016-01-20 | Disposition: A | Discharge status: Home / Self Care | Payer: Medicare PPO | Source: Ambulatory Visit | Attending: Internal Medicine | Admitting: Internal Medicine

## 2016-01-20 DIAGNOSIS — Z780 Asymptomatic menopausal state: Secondary | ICD-10-CM | POA: Diagnosis not present

## 2016-01-20 DIAGNOSIS — E2839 Other primary ovarian failure: Secondary | ICD-10-CM

## 2016-01-20 LAB — HM DEXA SCAN

## 2016-01-20 NOTE — Addendum Note (Signed)
Addended by: [name] on: 01/20/2016 10:42 AM   Modules accepted: Miquel Dunn

## 2016-01-20 NOTE — Addendum Note (Signed)
Addended by: [name] on: 01/20/2016 12:40 PM   Modules accepted: Miquel Dunn

## 2016-02-29 DIAGNOSIS — M1711 Unilateral primary osteoarthritis, right knee: Secondary | ICD-10-CM | POA: Diagnosis not present

## 2016-03-07 DIAGNOSIS — M1711 Unilateral primary osteoarthritis, right knee: Secondary | ICD-10-CM | POA: Diagnosis not present

## 2016-03-14 DIAGNOSIS — M1711 Unilateral primary osteoarthritis, right knee: Secondary | ICD-10-CM | POA: Diagnosis not present

## 2016-05-20 DIAGNOSIS — M4806 Spinal stenosis, lumbar region: Secondary | ICD-10-CM | POA: Diagnosis not present

## 2016-05-31 DIAGNOSIS — M4806 Spinal stenosis, lumbar region: Secondary | ICD-10-CM | POA: Diagnosis not present

## 2016-06-22 DIAGNOSIS — M4806 Spinal stenosis, lumbar region: Secondary | ICD-10-CM | POA: Diagnosis not present

## 2016-06-22 DIAGNOSIS — M1711 Unilateral primary osteoarthritis, right knee: Secondary | ICD-10-CM | POA: Diagnosis not present

## 2016-06-28 ENCOUNTER — Ambulatory Visit (INDEPENDENT_AMBULATORY_CARE_PROVIDER_SITE_OTHER): Payer: Medicare PPO | Admitting: Internal Medicine

## 2016-06-28 ENCOUNTER — Encounter: Payer: Self-pay | Admitting: Internal Medicine

## 2016-06-28 ENCOUNTER — Other Ambulatory Visit (INDEPENDENT_AMBULATORY_CARE_PROVIDER_SITE_OTHER): Payer: Medicare PPO

## 2016-06-28 VITALS — BP 144/88 | HR 86 | Temp 98.2°F | Ht 69.5 in | Wt 322.0 lb

## 2016-06-28 DIAGNOSIS — E785 Hyperlipidemia, unspecified: Secondary | ICD-10-CM

## 2016-06-28 DIAGNOSIS — E118 Type 2 diabetes mellitus with unspecified complications: Secondary | ICD-10-CM

## 2016-06-28 DIAGNOSIS — I10 Essential (primary) hypertension: Secondary | ICD-10-CM

## 2016-06-28 DIAGNOSIS — I70211 Atherosclerosis of native arteries of extremities with intermittent claudication, right leg: Secondary | ICD-10-CM

## 2016-06-28 LAB — LIPID PANEL
Cholesterol: 221 mg/dL — ABNORMAL HIGH (ref 0–200)
HDL: 73.1 mg/dL (ref >39.00)
LDL CALC: 114 mg/dL — AB (ref 0–99)
NONHDL: 148.34
Total CHOL/HDL Ratio: 3
Triglycerides: 172 mg/dL — ABNORMAL HIGH (ref 0.0–149.0)
VLDL: 34.4 mg/dL (ref 0.0–40.0)

## 2016-06-28 LAB — MICROALBUMIN / CREATININE URINE RATIO
Creatinine,U: 176.5 mg/dL
MICROALB/CREAT RATIO: 84 mg/g — AB (ref 0.0–30.0)
Microalb, Ur: 148.2 mg/dL — ABNORMAL HIGH (ref 0.0–1.9)

## 2016-06-28 LAB — COMPREHENSIVE METABOLIC PANEL
ALK PHOS: 83 U/L (ref 39–117)
ALT: 12 U/L (ref 0–35)
AST: 12 U/L (ref 0–37)
Albumin: 4.3 g/dL (ref 3.5–5.2)
BUN: 21 mg/dL (ref 6–23)
CO2: 27 mEq/L (ref 19–32)
CREATININE: 0.96 mg/dL (ref 0.40–1.20)
Calcium: 9.9 mg/dL (ref 8.4–10.5)
Chloride: 101 mEq/L (ref 96–112)
GFR: 73.24 mL/min (ref >60.00)
Glucose, Bld: 175 mg/dL — ABNORMAL HIGH (ref 70–99)
POTASSIUM: 3.9 meq/L (ref 3.5–5.1)
SODIUM: 139 meq/L (ref 135–145)
TOTAL PROTEIN: 7.4 g/dL (ref 6.0–8.3)
Total Bilirubin: 1 mg/dL (ref 0.2–1.2)

## 2016-06-28 LAB — HEMOGLOBIN A1C: HEMOGLOBIN A1C: 8.4 % — AB (ref 4.6–6.5)

## 2016-06-28 MED ORDER — GLUCOSE BLOOD VI STRP: ORAL_STRIP | 12 refills | Status: DC

## 2016-06-28 MED ORDER — ONETOUCH VERIO W/DEVICE KIT: 1.0000 | PACK | Freq: Two times a day (BID) | 0 refills | Status: DC

## 2016-06-28 MED ORDER — CANAGLIFLOZIN 100 MG PO TABS: 100.0000 mg | ORAL_TABLET | Freq: Every day | ORAL | 1 refills | Status: DC

## 2016-06-28 MED ORDER — PHENTERMINE HCL 37.5 MG PO TABS: 37.5000 mg | ORAL_TABLET | Freq: Every day | ORAL | 3 refills | Status: DC

## 2016-06-28 NOTE — Progress Notes (Signed)
Pre visit review using our clinic review tool, if applicable. No additional management support is needed unless otherwise documented below in the visit note. 

## 2016-06-28 NOTE — Progress Notes (Signed)
Subjective:  Patient ID: Holly [name], female    DOB: [birth year]  Age: 75 y.o. MRN: [id number]  CC: Hypertension; Diabetes; and Hyperlipidemia   HPI BRE PECINA presents for follow-up on the above medical problems. She is frustrated that she has not been able to lose weight and requests a refill on phentermine to help her with weight loss. She has had her back thoroughly evaluated and found that she has spinal stenosis but is not a surgical candidate.  She doesn't know if her blood sugars are very well controlled She doesn't check it very frequently but she denies polyuria/polydipsia/or polyphagia.  She tells me her blood pressures well controlled and she has had no recent episodes of chest pain, shortness of breath, palpitations, edema, or fatigue.  Outpatient Medications Prior to Visit  Medication Sig Dispense Refill  . aspirin 81 MG EC tablet Take 81 mg by mouth daily.      . cetirizine (ZYRTEC ALLERGY) 10 MG tablet Take 10 mg by mouth daily.      . Cholecalciferol (VITAMIN D) 400 UNIT capsule Take 400 Units by mouth daily.      . metFORMIN (GLUCOPHAGE) 1000 MG tablet TAKE 1 TABLET TWICE DAILY FOR DIABETES CONTROL 180 tablet 3  . simvastatin (ZOCOR) 40 MG tablet Take 1 tablet (40 mg total) by mouth daily. 90 tablet 3  . valsartan-hydrochlorothiazide (DIOVAN-HCT) 320-25 MG tablet Take 1 tablet by mouth daily. 90 tablet 1  . ACCU-CHEK FASTCLIX LANCETS MISC Use as directed to test blood sugars Dx: 250.00 102 each 12  . Blood Glucose Monitoring Suppl (ACCU-CHEK NANO SMARTVIEW) W/DEVICE KIT Use as directed to test blood sugars Dx: 250.00 1 kit 0  . glucose blood (ACCU-CHEK SMARTVIEW) test strip Use as instructed to test blood sugars Dx: 250.00 100 each 12  . gabapentin (NEURONTIN) 300 MG capsule nightly (Patient not taking: Reported on 06/28/2016) 30 capsule 3  . oxybutynin (DITROPAN-XL) 5 MG 24 hr tablet     . nebivolol (BYSTOLIC) 10 MG tablet Take 1 tablet (10 mg total) by mouth  daily. (Patient not taking: Reported on 06/28/2016) 90 tablet 3  . oxyCODONE-acetaminophen (PERCOCET) 7.5-325 MG per tablet Take 1 tablet by mouth every 4 (four) hours as needed. (Patient not taking: Reported on 06/28/2016) 50 tablet 0   No facility-administered medications prior to visit.     ROS Review of Systems  Constitutional: Negative.  Negative for activity change, appetite change, chills, fatigue and fever.  HENT: Negative.  Negative for trouble swallowing.   Eyes: Negative.  Negative for visual disturbance.  Respiratory: Negative.  Negative for cough, choking, chest tightness, shortness of breath and stridor.   Cardiovascular: Negative.  Negative for chest pain, palpitations and leg swelling.  Gastrointestinal: Negative.  Negative for abdominal pain, constipation, diarrhea, nausea and vomiting.  Endocrine: Negative.  Negative for polydipsia, polyphagia and polyuria.  Genitourinary: Negative.   Musculoskeletal: Positive for back pain. Negative for arthralgias, joint swelling and myalgias.  Skin: Negative.  Negative for color change and rash.  Allergic/Immunologic: Negative.   Neurological: Negative.  Negative for dizziness and weakness.  Hematological: Negative.  Negative for adenopathy. Does not bruise/bleed easily.  Psychiatric/Behavioral: Negative.     Objective:  BP (!) 144/88 (BP Location: Left Arm, Patient Position: Sitting, Cuff Size: Large)   Pulse 86   Temp 98.2 F (36.8 C) (Oral)   Ht 5' 9.5" (1.765 m)   Wt (!) 322 lb (146.1 kg)   SpO2 96%   BMI 46.87  kg/m   BP Readings from Last 3 Encounters:  06/28/16 (!) 144/88  12/28/15 (!) 152/86  10/21/15 (!) 155/80    Wt Readings from Last 3 Encounters:  06/28/16 (!) 322 lb (146.1 kg)  12/28/15 (!) 327 lb (148.3 kg)  10/21/15 (!) 327 lb (148.3 kg)    Physical Exam  Constitutional: She is oriented to person, place, and time.  HENT:  Mouth/Throat: Oropharynx is clear and moist. No oropharyngeal exudate.  Eyes:  Conjunctivae are normal. Right eye exhibits no discharge. Left eye exhibits no discharge. No scleral icterus.  Neck: Normal range of motion. Neck supple. No JVD present. No tracheal deviation present. No thyromegaly present.  Cardiovascular: Normal rate, regular rhythm, normal heart sounds and intact distal pulses.  Exam reveals no gallop and no friction rub.   No murmur heard. Pulmonary/Chest: Effort normal and breath sounds normal. No stridor. No respiratory distress. She has no wheezes. She has no rales. She exhibits no tenderness.  Abdominal: Soft. Bowel sounds are normal. She exhibits no distension. There is no tenderness. There is no rebound and no guarding.  Musculoskeletal: Normal range of motion. She exhibits no edema, tenderness or deformity.  Lymphadenopathy:    She has no cervical adenopathy.  Neurological: She is oriented to person, place, and time.  Skin: Skin is warm and dry. No rash noted. She is not diaphoretic. No erythema. No pallor.  Vitals reviewed.   Lab Results  Component Value Date   WBC 6.5 12/28/2015   HGB 13.8 12/28/2015   HCT 42.4 12/28/2015   PLT 224.0 12/28/2015   GLUCOSE 175 (H) 06/28/2016   CHOL 221 (H) 06/28/2016   TRIG 172.0 (H) 06/28/2016   HDL 73.10 06/28/2016   LDLDIRECT 134.8 08/27/2010   LDLCALC 114 (H) 06/28/2016   ALT 12 06/28/2016   AST 12 06/28/2016   NA 139 06/28/2016   K 3.9 06/28/2016   CL 101 06/28/2016   CREATININE 0.96 06/28/2016   BUN 21 06/28/2016   CO2 27 06/28/2016   TSH 3.56 04/20/2015   HGBA1C 8.4 (H) 06/28/2016   MICROALBUR 148.2 (H) 06/28/2016    Dg Bone Density  Result Date: 01/20/2016 EXAM: DUAL X-RAY ABSORPTIOMETRY (DXA) FOR BONE MINERAL DENSITY IMPRESSION: Referring Physician:  [name] PATIENT: Name: Thara, Searing Patient ID: [id number] Birth Date: 1941/01/27 Height: 69.5 in. Sex: Female Measured: 01/20/2016 Weight: 300.0 lbs. Indications: Estrogen Deficient, Low Calcium Intake (269.3), Postmenopausal  Fractures: Treatments: Vitamin D (E933.5) ASSESSMENT: The BMD measured at AP Spine L1-L4 is 1.099 g/cm2 with a T-score of -0.8. This patient is considered normal according to [facility]) criteria. Site Region Measured Date Measured Age YA BMD Significant CHANGE T-score AP Spine  L1-L4     01[age]20/2016    72.6         -0.8    1.099 g/cm2 DualFemur Neck Left 3/20/2016    [age]         -0.6    0.953 g/cm2 World Health Organization [facility]) criteria for post-menopausal, Caucasian Women: Normal       T-score at or above -1 SD Osteopenia   T-score between -1 and -2.5 SD Osteoporosis T-score at or below -2.5 SD RECOMMENDATION: [place] recommends that FDA-approved medical therapies be considered in postmenopausal women and men age 25 or older with a: 1. Hip or vertebral (clinical or morphometric) fracture. 2. T-score of <-2.5 at the spine or hip. 3. Ten-year fracture probability by FRAX of 3% or greater for hip fracture  or 20% or greater for major osteoporotic fracture. All treatment decisions require clinical judgment and consideration of individual patient factors, including patient preferences, co-morbidities, previous drug use, risk factors not captured in the FRAX model (e.g. falls, vitamin D deficiency, increased bone turnover, interval significant decline in bone density) and possible under - or over-estimation of fracture risk by FRAX. All patients should ensure an adequate intake of dietary calcium (1200 mg/d) and vitamin D (800 IU daily) unless contraindicated. FOLLOW-UP: People with diagnosed cases of osteoporosis or at high risk for fracture should have regular bone mineral density tests. For patients eligible for Medicare, routine testing is allowed once every 2 years. The testing frequency can be increased to one year for patients who have rapidly progressing disease, those who are receiving or discontinuing medical therapy to restore bone mass, or have additional risk  factors. I have reviewed this report, and agree with the above findings. [facility] Radiology Electronically Signed   By: Marijo Conception, M.D.   On: 01/20/2016 10:06    Assessment & Plan:   Samantha was seen today for hypertension, diabetes and hyperlipidemia.  Diagnoses and all orders for this visit:  Essential hypertension- her blood pressure is well-controlled, electrolytes and renal function are stable. -     Comprehensive metabolic panel; Future  Type 2 diabetes mellitus with complication, without long-term current use of insulin ([place])- her A1c is up to 8.4%, in addition to metformin I have asked her to add on an SGLT2 inhibitor to help lower her blood sugars. -     Blood Glucose Monitoring Suppl (ONETOUCH VERIO) w/Device KIT; 1 Act by Does not apply route 2 (two) times daily. -     glucose blood (ONETOUCH VERIO) test strip; Use BID -     Comprehensive metabolic panel; Future -     Hemoglobin A1c; Future -     Microalbumin / creatinine urine ratio; Future -     Ambulatory referral to Ophthalmology -     canagliflozin (INVOKANA) 100 MG TABS tablet; Take 1 tablet (100 mg total) by mouth daily before breakfast.  Morbid obesity due to excess calories ([place])- in addition to lifestyle modifications will try phentermine to help reduce her caloric intake and help her lose weight. -     phentermine (ADIPEX-P) 37.5 MG tablet; Take 1 tablet (37.5 mg total) by mouth daily before breakfast.  Hyperlipidemia with target LDL less than 100- he has achieved her LDL goal is doing well on statin therapy. -     Lipid panel; Future  Atherosclerosis of native artery of right lower extremity with intermittent claudication ([place])- she is had no recent episodes of claudication, will continue to control her risk factors with good blood pressure and blood sugar control, aggressive statin therapy, and an aspirin a day. -     Lipid panel; Future   I have discontinued Ms. Trumbo's ACCU-CHEK NANO SMARTVIEW,  glucose blood, ACCU-CHEK FASTCLIX LANCETS, nebivolol, and oxyCODONE-acetaminophen. I am also having her start on ONETOUCH VERIO, glucose blood, phentermine, and canagliflozin. Additionally, I am having her maintain her aspirin, Vitamin D, cetirizine, oxybutynin, gabapentin, metFORMIN, simvastatin, and valsartan-hydrochlorothiazide.  Meds ordered this encounter  Medications  . Blood Glucose Monitoring Suppl (ONETOUCH VERIO) w/Device KIT    Sig: 1 Act by Does not apply route 2 (two) times daily.    Dispense:  2 kit    Refill:  0  . glucose blood (ONETOUCH VERIO) test strip    Sig: Use BID    Dispense:  100 each    Refill:  12  . phentermine (ADIPEX-P) 37.5 MG tablet    Sig: Take 1 tablet (37.5 mg total) by mouth daily before breakfast.    Dispense:  30 tablet    Refill:  3  . canagliflozin (INVOKANA) 100 MG TABS tablet    Sig: Take 1 tablet (100 mg total) by mouth daily before breakfast.    Dispense:  90 tablet    Refill:  1     Follow-up: Return in about 4 months (around 10/28/2016).  Scarlette Calico, MD

## 2016-06-28 NOTE — Patient Instructions (Signed)

## 2016-07-01 ENCOUNTER — Other Ambulatory Visit: Payer: Self-pay | Admitting: Internal Medicine

## 2016-07-01 DIAGNOSIS — I10 Essential (primary) hypertension: Secondary | ICD-10-CM

## 2016-07-01 DIAGNOSIS — E118 Type 2 diabetes mellitus with unspecified complications: Secondary | ICD-10-CM

## 2016-07-01 DIAGNOSIS — N183 Chronic kidney disease, stage 3 unspecified: Secondary | ICD-10-CM

## 2016-07-04 ENCOUNTER — Other Ambulatory Visit: Payer: Self-pay | Admitting: *Deleted

## 2016-07-04 ENCOUNTER — Telehealth: Payer: Self-pay | Admitting: Emergency Medicine

## 2016-07-04 MED ORDER — GLUCOSE BLOOD VI STRP: 1.0000 | ORAL_STRIP | Freq: Two times a day (BID) | 3 refills | Status: DC

## 2016-07-04 MED ORDER — ACCU-CHEK SOFTCLIX LANCETS MISC: 1.0000 | Freq: Two times a day (BID) | 3 refills | Status: DC

## 2016-07-04 MED ORDER — ACCU-CHEK AVIVA PLUS W/DEVICE KIT: PACK | 0 refills | Status: DC

## 2016-07-04 NOTE — Telephone Encounter (Signed)
Pt called and wants to know if she can get a prescription for an Accucheck glucose meter. The insurance will cover the strips for that actual meter. Pharmacy is [facility]. Please advise thanks.

## 2016-07-04 NOTE — Telephone Encounter (Signed)
Tried to call pt back, no voicemail to leave a message.

## 2016-07-04 NOTE — Telephone Encounter (Signed)
Rec'd fax stating pt insurance plan does not cover One touch. Accu-chek or True Metrix is preferred. Sending rx for accu-chek...Johny Chess

## 2016-08-18 DIAGNOSIS — Z1231 Encounter for screening mammogram for malignant neoplasm of breast: Secondary | ICD-10-CM | POA: Diagnosis not present

## 2016-08-18 LAB — HM MAMMOGRAPHY

## 2016-08-23 ENCOUNTER — Encounter: Payer: Self-pay | Admitting: Internal Medicine

## 2016-08-23 LAB — HM MAMMOGRAPHY

## 2016-08-23 NOTE — Progress Notes (Signed)
Negative for malignancy. Recommended routine evaluation in 1 year.

## 2016-08-29 ENCOUNTER — Other Ambulatory Visit: Payer: Self-pay | Admitting: Internal Medicine

## 2016-09-05 DIAGNOSIS — E785 Hyperlipidemia, unspecified: Secondary | ICD-10-CM | POA: Diagnosis not present

## 2016-09-05 DIAGNOSIS — Z7984 Long term (current) use of oral hypoglycemic drugs: Secondary | ICD-10-CM | POA: Diagnosis not present

## 2016-09-05 DIAGNOSIS — Z6841 Body Mass Index (BMI) 40.0 and over, adult: Secondary | ICD-10-CM | POA: Diagnosis not present

## 2016-09-05 DIAGNOSIS — M545 Low back pain: Secondary | ICD-10-CM | POA: Diagnosis not present

## 2016-09-05 DIAGNOSIS — M159 Polyosteoarthritis, unspecified: Secondary | ICD-10-CM | POA: Diagnosis not present

## 2016-09-05 DIAGNOSIS — I10 Essential (primary) hypertension: Secondary | ICD-10-CM | POA: Diagnosis not present

## 2016-09-05 DIAGNOSIS — E119 Type 2 diabetes mellitus without complications: Secondary | ICD-10-CM | POA: Diagnosis not present

## 2016-09-17 ENCOUNTER — Other Ambulatory Visit: Payer: Self-pay | Admitting: Internal Medicine

## 2016-09-19 DIAGNOSIS — M1711 Unilateral primary osteoarthritis, right knee: Secondary | ICD-10-CM | POA: Diagnosis not present

## 2016-09-19 DIAGNOSIS — M48061 Spinal stenosis, lumbar region without neurogenic claudication: Secondary | ICD-10-CM | POA: Diagnosis not present

## 2016-09-27 ENCOUNTER — Ambulatory Visit (INDEPENDENT_AMBULATORY_CARE_PROVIDER_SITE_OTHER): Payer: Medicare PPO

## 2016-09-27 DIAGNOSIS — Z23 Encounter for immunization: Secondary | ICD-10-CM

## 2016-10-25 ENCOUNTER — Ambulatory Visit (INDEPENDENT_AMBULATORY_CARE_PROVIDER_SITE_OTHER)
Admission: RE | Admit: 2016-10-25 | Discharge: 2016-10-25 | Disposition: A | Discharge status: Home / Self Care | Payer: Medicare PPO | Source: Ambulatory Visit | Attending: Internal Medicine | Admitting: Internal Medicine

## 2016-10-25 ENCOUNTER — Encounter: Payer: Self-pay | Admitting: Internal Medicine

## 2016-10-25 ENCOUNTER — Ambulatory Visit (INDEPENDENT_AMBULATORY_CARE_PROVIDER_SITE_OTHER): Payer: Medicare PPO | Admitting: Internal Medicine

## 2016-10-25 VITALS — BP 162/100 | HR 83 | Temp 98.2°F | Resp 16 | Ht 69.5 in | Wt 326.0 lb

## 2016-10-25 DIAGNOSIS — I10 Essential (primary) hypertension: Secondary | ICD-10-CM | POA: Diagnosis not present

## 2016-10-25 DIAGNOSIS — R059 Cough, unspecified: Secondary | ICD-10-CM

## 2016-10-25 DIAGNOSIS — R05 Cough: Secondary | ICD-10-CM

## 2016-10-25 DIAGNOSIS — R0683 Snoring: Secondary | ICD-10-CM

## 2016-10-25 DIAGNOSIS — N183 Chronic kidney disease, stage 3 unspecified: Secondary | ICD-10-CM

## 2016-10-25 DIAGNOSIS — J069 Acute upper respiratory infection, unspecified: Secondary | ICD-10-CM

## 2016-10-25 DIAGNOSIS — B9789 Other viral agents as the cause of diseases classified elsewhere: Secondary | ICD-10-CM

## 2016-10-25 DIAGNOSIS — E118 Type 2 diabetes mellitus with unspecified complications: Secondary | ICD-10-CM

## 2016-10-25 LAB — POCT GLUCOSE (DEVICE FOR HOME USE)
Glucose Fasting, POC: 240 mg/dL — AB (ref 70–99)
POC GLUCOSE: 240 mg/dL — AB (ref 70–99)

## 2016-10-25 LAB — POCT GLYCOSYLATED HEMOGLOBIN (HGB A1C): Hemoglobin A1C: 9.1

## 2016-10-25 MED ORDER — CANAGLIFLOZIN 300 MG PO TABS: 300.0000 mg | ORAL_TABLET | Freq: Every day | ORAL | 1 refills | Status: DC

## 2016-10-25 MED ORDER — NEBIVOLOL HCL 10 MG PO TABS: 10.0000 mg | ORAL_TABLET | Freq: Every day | ORAL | 0 refills | Status: DC

## 2016-10-25 MED ORDER — HYDROCODONE-HOMATROPINE 5-1.5 MG/5ML PO SYRP: 5.0000 mL | ORAL_SOLUTION | Freq: Three times a day (TID) | ORAL | 0 refills | Status: DC | PRN

## 2016-10-25 NOTE — Progress Notes (Signed)
Pre visit review using our clinic review tool, if applicable. No additional management support is needed unless otherwise documented below in the visit note. 

## 2016-10-25 NOTE — Patient Instructions (Signed)
Hypertension Hypertension, commonly called high blood pressure, is when the force of blood pumping through your arteries is too strong. Your arteries are the blood vessels that carry blood from your heart throughout your body. A blood pressure reading consists of a higher number over a lower number, such as 110/72. The higher number (systolic) is the pressure inside your arteries when your heart pumps. The lower number (diastolic) is the pressure inside your arteries when your heart relaxes. Ideally you want your blood pressure below 120/80. Hypertension forces your heart to work harder to pump blood. Your arteries may become narrow or stiff. Having untreated or uncontrolled hypertension can cause heart attack, stroke, kidney disease, and other problems. What increases the risk? Some risk factors for high blood pressure are controllable. Others are not. Risk factors you cannot control include:  Race. You may be at higher risk if you are African American.  Age. Risk increases with age.  Gender. Men are at higher risk than women before age 45 years. After age 65, women are at higher risk than men. Risk factors you can control include:  Not getting enough exercise or physical activity.  Being overweight.  Getting too much fat, sugar, calories, or salt in your diet.  Drinking too much alcohol. What are the signs or symptoms? Hypertension does not usually cause signs or symptoms. Extremely high blood pressure (hypertensive crisis) may cause headache, anxiety, shortness of breath, and nosebleed. How is this diagnosed? To check if you have hypertension, your health care provider will measure your blood pressure while you are seated, with your arm held at the level of your heart. It should be measured at least twice using the same arm. Certain conditions can cause a difference in blood pressure between your right and left arms. A blood pressure reading that is higher than normal on one occasion does  not mean that you need treatment. If it is not clear whether you have high blood pressure, you may be asked to return on a different day to have your blood pressure checked again. Or, you may be asked to monitor your blood pressure at home for 1 or more weeks. How is this treated? Treating high blood pressure includes making lifestyle changes and possibly taking medicine. Living a healthy lifestyle can help lower high blood pressure. You may need to change some of your habits. Lifestyle changes may include:  Following the DASH diet. This diet is high in fruits, vegetables, and whole grains. It is low in salt, red meat, and added sugars.  Keep your sodium intake below 2,300 mg per day.  Getting at least 30-45 minutes of aerobic exercise at least 4 times per week.  Losing weight if necessary.  Not smoking.  Limiting alcoholic beverages.  Learning ways to reduce stress. Your health care provider may prescribe medicine if lifestyle changes are not enough to get your blood pressure under control, and if one of the following is true:  You are 18-59 years of age and your systolic blood pressure is above 140.  You are 60 years of age or older, and your systolic blood pressure is above 150.  Your diastolic blood pressure is above 90.  You have diabetes, and your systolic blood pressure is over 140 or your diastolic blood pressure is over 90.  You have kidney disease and your blood pressure is above 140/90.  You have heart disease and your blood pressure is above 140/90. Your personal target blood pressure may vary depending on your medical   conditions, your age, and other factors. Follow these instructions at home:  Have your blood pressure rechecked as directed by your health care provider.  Take medicines only as directed by your health care provider. Follow the directions carefully. Blood pressure medicines must be taken as prescribed. The medicine does not work as well when you skip  doses. Skipping doses also puts you at risk for problems.  Do not smoke.  Monitor your blood pressure at home as directed by your health care provider. Contact a health care provider if:  You think you are having a reaction to medicines taken.  You have recurrent headaches or feel dizzy.  You have swelling in your ankles.  You have trouble with your vision. Get help right away if:  You develop a severe headache or confusion.  You have unusual weakness, numbness, or feel faint.  You have severe chest or abdominal pain.  You vomit repeatedly.  You have trouble breathing. This information is not intended to replace advice given to you by your health care provider. Make sure you discuss any questions you have with your health care provider. Document Released: 10/31/2005 Document Revised: 04/07/2016 Document Reviewed: 08/23/2013 Elsevier Interactive Patient Education  2017 [employer]  

## 2016-10-25 NOTE — Progress Notes (Signed)
Subjective:  Patient ID: Holly [name], female    DOB: 1941/07/14  Age: 75 y.o. MRN: [id number]  CC: Cough; Hypertension; and Diabetes   HPI Holly [name] presents for f/up - she complains of a 2 day hx of NP cough with runny nose, itchy eyes, and scratchy throat. Her BP has not been well controlled, she complains of snoring and possible apnea. She denies HA/BV/CP/DOE/SOB/EDEMA. Her blood sugars have not been well controlled either.  Outpatient Medications Prior to Visit  Medication Sig Dispense Refill  . ACCU-CHEK SOFTCLIX LANCETS lancets 1 each by Other route 2 (two) times daily. Use to check blood sugars twice a day Dx e11.9 100 each 3  . aspirin 81 MG EC tablet Take 81 mg by mouth daily.      . Blood Glucose Monitoring Suppl (ACCU-CHEK AVIVA PLUS) w/Device KIT Use to check blood sugars daily Dx E11.9 1 kit 0  . cetirizine (ZYRTEC ALLERGY) 10 MG tablet Take 10 mg by mouth daily.      . Cholecalciferol (VITAMIN D) 400 UNIT capsule Take 400 Units by mouth daily.      Marland Kitchen gabapentin (NEURONTIN) 300 MG capsule nightly 30 capsule 3  . glucose blood (ACCU-CHEK AVIVA PLUS) test strip 1 each by Other route 2 (two) times daily. Use to check blood sugars twice a day Dx E11.9 100 each 3  . metFORMIN (GLUCOPHAGE) 1000 MG tablet TAKE ONE TABLET BY MOUTH TWICE DAILY FOR DIABETES CONTROL 180 tablet 1  . oxybutynin (DITROPAN-XL) 5 MG 24 hr tablet     . simvastatin (ZOCOR) 40 MG tablet TAKE 1 TABLET EVERY DAY 90 tablet 3  . valsartan-hydrochlorothiazide (DIOVAN-HCT) 320-25 MG tablet TAKE ONE TABLET BY MOUTH ONCE DAILY 90 tablet 1  . canagliflozin (INVOKANA) 100 MG TABS tablet Take 1 tablet (100 mg total) by mouth daily before breakfast. 90 tablet 1  . phentermine (ADIPEX-P) 37.5 MG tablet Take 1 tablet (37.5 mg total) by mouth daily before breakfast. 30 tablet 3   No facility-administered medications prior to visit.     ROS Review of Systems  Constitutional: Negative for activity change,  appetite change, chills, fatigue, fever and unexpected weight change.  HENT: Positive for congestion and sore throat. Negative for facial swelling, sinus pressure and trouble swallowing.   Eyes: Negative.  Negative for visual disturbance.  Respiratory: Positive for apnea and cough. Negative for choking, chest tightness, shortness of breath, wheezing and stridor.   Cardiovascular: Negative for chest pain, palpitations and leg swelling.  Gastrointestinal: Negative for abdominal pain, constipation, diarrhea, nausea and vomiting.  Endocrine: Negative for cold intolerance, heat intolerance, polydipsia, polyphagia and polyuria.  Genitourinary: Negative.  Negative for difficulty urinating, dysuria, frequency and urgency.  Musculoskeletal: Negative.  Negative for back pain, myalgias and neck pain.  Skin: Negative.  Negative for color change and rash.  Allergic/Immunologic: Negative.   Neurological: Negative.  Negative for dizziness, weakness and numbness.  Hematological: Negative for adenopathy. Does not bruise/bleed easily.  Psychiatric/Behavioral: Negative.     Objective:  BP (!) 162/100 (BP Location: Left Arm, Patient Position: Sitting, Cuff Size: Large)   Pulse 83   Temp 98.2 F (36.8 C) (Oral)   Resp 16   Ht 5' 9.5" (1.765 m)   Wt (!) 326 lb (147.9 kg)   SpO2 96%   BMI 47.45 kg/m   BP Readings from Last 3 Encounters:  10/25/16 (!) 162/100  06/28/16 (!) 144/88  12/28/15 (!) 152/86    Wt Readings from Last 3  Encounters:  10/25/16 (!) 326 lb (147.9 kg)  06/28/16 (!) 322 lb (146.1 kg)  12/28/15 (!) 327 lb (148.3 kg)    Physical Exam  Constitutional: She is oriented to person, place, and time. She appears well-developed and well-nourished.  Non-toxic appearance. She does not have a sickly appearance. She does not appear ill. No distress.  HENT:  Mouth/Throat: Oropharynx is clear and moist. No oropharyngeal exudate.  Eyes: Conjunctivae are normal. Right eye exhibits no discharge.  Left eye exhibits no discharge. No scleral icterus.  Neck: Normal range of motion. Neck supple. No JVD present. No tracheal deviation present. No thyromegaly present.  Cardiovascular: Normal rate, regular rhythm, normal heart sounds and intact distal pulses.  Exam reveals no gallop and no friction rub.   No murmur heard. Pulmonary/Chest: Effort normal and breath sounds normal. No stridor. No respiratory distress. She has no wheezes. She has no rales. She exhibits no tenderness.  Abdominal: Soft. Bowel sounds are normal. She exhibits no distension and no mass. There is no tenderness. There is no rebound and no guarding.  Musculoskeletal: Normal range of motion. She exhibits no edema, tenderness or deformity.  Lymphadenopathy:    She has no cervical adenopathy.  Neurological: She is oriented to person, place, and time.  Skin: Skin is warm and dry. No rash noted. She is not diaphoretic. No erythema. No pallor.  Vitals reviewed.   Lab Results  Component Value Date   WBC 6.5 12/28/2015   HGB 13.8 12/28/2015   HCT 42.4 12/28/2015   PLT 224.0 12/28/2015   GLUCOSE 175 (H) 06/28/2016   CHOL 221 (H) 06/28/2016   TRIG 172.0 (H) 06/28/2016   HDL 73.10 06/28/2016   LDLDIRECT 134.8 08/27/2010   LDLCALC 114 (H) 06/28/2016   ALT 12 06/28/2016   AST 12 06/28/2016   NA 139 06/28/2016   K 3.9 06/28/2016   CL 101 06/28/2016   CREATININE 0.96 06/28/2016   BUN 21 06/28/2016   CO2 27 06/28/2016   TSH 3.56 04/20/2015   HGBA1C 9.1 10/25/2016   MICROALBUR 148.2 (H) 06/28/2016    Dg Bone Density  Result Date: 01/20/2016 EXAM: DUAL X-RAY ABSORPTIOMETRY (DXA) FOR BONE MINERAL DENSITY IMPRESSION: Referring Physician:  Janith [name] PATIENT: Name: Holly [name], Holly [name] Patient ID: [id number] Birth Date: [birth year] Height: 69.5 in. Sex: Female Measured: 01/20/2016 Weight: 300.0 lbs. Indications: Estrogen Deficient, Low Calcium Intake (269.3), Postmenopausal Fractures: Treatments: Vitamin D (E933.5) ASSESSMENT:  The BMD measured at AP Spine L1-L4 is 1.099 g/cm2 with a T-score of -0.8. This patient is considered normal according to [facility]) criteria. Site Region Measured Date Measured Age YA BMD Significant CHANGE T-score AP Spine  L1-L4     01/20/2016    72.6         -0.8    1.099 g/cm2 DualFemur Neck Left 01/20/2016    72.6         -0.6    0.953 g/cm2 World Health Organization [facility]) criteria for post-menopausal, Caucasian Women: Normal       T-score at or above -1 SD Osteopenia   T-score between -1 and -2.5 SD Osteoporosis T-score at or below -2.5 SD RECOMMENDATION: [place] recommends that FDA-approved medical therapies be considered in postmenopausal women and men age 24 or older with a: 1. Hip or vertebral (clinical or morphometric) fracture. 2. T-score of <-2.5 at the spine or hip. 3. Ten-year fracture probability by FRAX of 3% or greater for hip fracture or 20% or greater for  major osteoporotic fracture. All treatment decisions require clinical judgment and consideration of individual patient factors, including patient preferences, co-morbidities, previous drug use, risk factors not captured in the FRAX model (e.g. falls, vitamin D deficiency, increased bone turnover, interval significant decline in bone density) and possible under - or over-estimation of fracture risk by FRAX. All patients should ensure an adequate intake of dietary calcium (1200 mg/d) and vitamin D (800 IU daily) unless contraindicated. FOLLOW-UP: People with diagnosed cases of osteoporosis or at high risk for fracture should have regular bone mineral density tests. For patients eligible for Medicare, routine testing is allowed once every 2 years. The testing frequency can be increased to one year for patients who have rapidly progressing disease, those who are receiving or discontinuing medical therapy to restore bone mass, or have additional risk factors. I have reviewed this report, and agree with  the above findings. [facility] Electronically Signed   By: Marijo Conception, M.D.   On: 01/20/2016 10:06    Assessment & Plan:   Hosanna was seen today for cough, hypertension and diabetes.  Diagnoses and all orders for this visit:  Essential hypertension- her blood pressure is not well controlled on the combination of an ARB and thiazide diuretic. I've asked her to add 10 mg of Bystolic each day. Will also screen her for sleep apnea. -     Basic metabolic panel; Future -     nebivolol (BYSTOLIC) 10 MG tablet; Take 1 tablet (10 mg total) by mouth daily.  Type 2 diabetes mellitus with complication, without long-term current use of insulin ([place])- her A1c is up to 9.1%, her blood sugars are not well-controlled. I will increase the dose of Invega, from 100 mg a day to 300 mg a day. Will continue metformin at the current dose. -     Basic metabolic panel; Future -     Cancel: Hemoglobin A1c; Future -     POCT glycosylated hemoglobin (Hb A1C) -     POCT Glucose (Device for Home Use) -     canagliflozin (INVOKANA) 300 MG TABS tablet; Take 1 tablet (300 mg total) by mouth daily.  Kidney disease, chronic, stage III (GFR 30-59 ml/min)  Cough- her exam and chest x-ray are normal, will treat for viral URI. -     DG Chest 2 View; Future -     HYDROcodone-homatropine (HYCODAN) 5-1.5 MG/5ML syrup; Take 5 mLs by mouth every 8 (eight) hours as needed for cough.  Viral upper respiratory tract infection- will control her symptoms with Hycodan. -     HYDROcodone-homatropine (HYCODAN) 5-1.5 MG/5ML syrup; Take 5 mLs by mouth every 8 (eight) hours as needed for cough.  Snoring -     Ambulatory referral to Sleep Studies   I have discontinued Ms. Zobrist's phentermine and canagliflozin. I am also having her start on nebivolol, HYDROcodone-homatropine, and canagliflozin. Additionally, I am having her maintain her aspirin, Vitamin D, cetirizine, oxybutynin, gabapentin, valsartan-hydrochlorothiazide,  glucose blood, ACCU-CHEK AVIVA PLUS, ACCU-CHEK SOFTCLIX LANCETS, metFORMIN, and simvastatin.  Meds ordered this encounter  Medications  . nebivolol (BYSTOLIC) 10 MG tablet    Sig: Take 1 tablet (10 mg total) by mouth daily.    Dispense:  49 tablet    Refill:  0  . HYDROcodone-homatropine (HYCODAN) 5-1.5 MG/5ML syrup    Sig: Take 5 mLs by mouth every 8 (eight) hours as needed for cough.    Dispense:  120 mL    Refill:  0  . canagliflozin (INVOKANA)  300 MG TABS tablet    Sig: Take 1 tablet (300 mg total) by mouth daily.    Dispense:  90 tablet    Refill:  1     Follow-up: Return in about 6 weeks (around 12/06/2016).  Scarlette Calico, MD

## 2016-12-06 ENCOUNTER — Ambulatory Visit (INDEPENDENT_AMBULATORY_CARE_PROVIDER_SITE_OTHER): Payer: Medicare PPO | Admitting: Internal Medicine

## 2016-12-06 VITALS — BP 160/84 | HR 74 | Temp 98.2°F | Resp 16 | Ht 69.5 in | Wt 319.0 lb

## 2016-12-06 DIAGNOSIS — H60393 Other infective otitis externa, bilateral: Secondary | ICD-10-CM | POA: Insufficient documentation

## 2016-12-06 DIAGNOSIS — I10 Essential (primary) hypertension: Secondary | ICD-10-CM

## 2016-12-06 DIAGNOSIS — E118 Type 2 diabetes mellitus with unspecified complications: Secondary | ICD-10-CM

## 2016-12-06 DIAGNOSIS — R0683 Snoring: Secondary | ICD-10-CM

## 2016-12-06 LAB — POCT GLUCOSE (DEVICE FOR HOME USE): GLUCOSE FASTING, POC: 185 mg/dL — AB (ref 70–99)

## 2016-12-06 MED ORDER — EXENATIDE ER 2 MG/0.85ML ~~LOC~~ AUIJ: 1.0000 | AUTO-INJECTOR | SUBCUTANEOUS | 3 refills | Status: DC

## 2016-12-06 MED ORDER — NEOMYCIN-POLYMYXIN-HC 3.5-10000-1 OT SUSP: 3.0000 [drp] | Freq: Four times a day (QID) | OTIC | 1 refills | Status: DC

## 2016-12-06 NOTE — Progress Notes (Signed)
Subjective:  Patient ID: Holly [name], female    DOB: 1940-11-25  Age: 76 y.o. MRN: [id number]  CC: Hypertension and Diabetes   HPI Holly [name] presents for a recheck on hypertension and diabetes. She complains that her blood sugar still spikes up to 180-200. When I last saw I was concerned about snoring and sleep apnea and she was referred to have a sleep study done but she has not pursued that yet. She tells me her blood pressure is better but is still a little bit too high. She has had no recent episodes of headache, chest pain, shortness of breath, palpitations, edema, or fatigue. She complains that her ear canals itch intermittently.  Outpatient Medications Prior to Visit  Medication Sig Dispense Refill  . ACCU-CHEK SOFTCLIX LANCETS lancets 1 each by Other route 2 (two) times daily. Use to check blood sugars twice a day Dx e11.9 100 each 3  . aspirin 81 MG EC tablet Take 81 mg by mouth daily.      . Blood Glucose Monitoring Suppl (ACCU-CHEK AVIVA PLUS) w/Device KIT Use to check blood sugars daily Dx E11.9 1 kit 0  . canagliflozin (INVOKANA) 300 MG TABS tablet Take 1 tablet (300 mg total) by mouth daily. 90 tablet 1  . cetirizine (ZYRTEC ALLERGY) 10 MG tablet Take 10 mg by mouth daily.      . Cholecalciferol (VITAMIN D) 400 UNIT capsule Take 400 Units by mouth daily.      Marland Kitchen gabapentin (NEURONTIN) 300 MG capsule nightly 30 capsule 3  . glucose blood (ACCU-CHEK AVIVA PLUS) test strip 1 each by Other route 2 (two) times daily. Use to check blood sugars twice a day Dx E11.9 100 each 3  . metFORMIN (GLUCOPHAGE) 1000 MG tablet TAKE ONE TABLET BY MOUTH TWICE DAILY FOR DIABETES CONTROL 180 tablet 1  . nebivolol (BYSTOLIC) 10 MG tablet Take 1 tablet (10 mg total) by mouth daily. 49 tablet 0  . oxybutynin (DITROPAN-XL) 5 MG 24 hr tablet     . simvastatin (ZOCOR) 40 MG tablet TAKE 1 TABLET EVERY DAY 90 tablet 3  . valsartan-hydrochlorothiazide (DIOVAN-HCT) 320-25 MG tablet TAKE ONE  TABLET BY MOUTH ONCE DAILY 90 tablet 1  . HYDROcodone-homatropine (HYCODAN) 5-1.5 MG/5ML syrup Take 5 mLs by mouth every 8 (eight) hours as needed for cough. 120 mL 0   No facility-administered medications prior to visit.     ROS Review of Systems  Constitutional: Negative for activity change, chills, fatigue and unexpected weight change.  HENT: Negative.  Negative for ear discharge, ear pain, hearing loss and sore throat.   Eyes: Negative for visual disturbance.  Respiratory: Positive for apnea. Negative for shortness of breath.   Cardiovascular: Negative.  Negative for chest pain, palpitations and leg swelling.  Gastrointestinal: Negative for abdominal pain, constipation, diarrhea, nausea and vomiting.  Endocrine: Positive for polyuria. Negative for polydipsia and polyphagia.  Genitourinary: Negative.  Negative for decreased urine volume, difficulty urinating, dysuria, frequency and urgency.  Musculoskeletal: Negative.  Negative for back pain and myalgias.  Skin: Negative.   Allergic/Immunologic: Negative.   Neurological: Negative.   Hematological: Negative.  Negative for adenopathy. Does not bruise/bleed easily.  Psychiatric/Behavioral: Negative.     Objective:  BP (!) 160/84 (BP Location: Left Arm, Patient Position: Sitting, Cuff Size: Large)   Pulse 74   Temp 98.2 F (36.8 C) (Oral)   Resp 16   Ht 5' 9.5" (1.765 m)   Wt (!) 319 lb 0.6 oz (144.7 kg)  SpO2 96%   BMI 46.44 kg/m   BP Readings from Last 3 Encounters:  12/06/16 (!) 160/84  10/25/16 (!) 162/100  06/28/16 (!) 144/88    Wt Readings from Last 3 Encounters:  12/06/16 (!) 319 lb 0.6 oz (144.7 kg)  10/25/16 (!) 326 lb (147.9 kg)  06/28/16 (!) 322 lb (146.1 kg)    Physical Exam  Constitutional: She is oriented to person, place, and time. No distress.  HENT:  Right Ear: Hearing, tympanic membrane and external ear normal.  Left Ear: Hearing, tympanic membrane and external ear normal.  Mouth/Throat:  Oropharynx is clear and moist. No oropharyngeal exudate.  There is mild flaking in both external auditory canals  Eyes: Conjunctivae are normal. Right eye exhibits no discharge. Left eye exhibits no discharge. No scleral icterus.  Neck: Normal range of motion. Neck supple. No JVD present. No tracheal deviation present. No thyromegaly present.  Cardiovascular: Normal rate, regular rhythm, normal heart sounds and intact distal pulses.  Exam reveals no gallop and no friction rub.   No murmur heard. Pulmonary/Chest: Effort normal and breath sounds normal. No stridor. No respiratory distress. She has no wheezes. She has no rales. She exhibits no tenderness.  Abdominal: Soft. Bowel sounds are normal. She exhibits no distension and no mass. There is no tenderness. There is no rebound and no guarding.  Musculoskeletal: Normal range of motion. She exhibits no edema, tenderness or deformity.  Lymphadenopathy:    She has no cervical adenopathy.  Neurological: She is oriented to person, place, and time.  Skin: Skin is warm and dry. No rash noted. She is not diaphoretic. No erythema. No pallor.  Vitals reviewed.   Lab Results  Component Value Date   WBC 6.5 12/28/2015   HGB 13.8 12/28/2015   HCT 42.4 12/28/2015   PLT 224.0 12/28/2015   GLUCOSE 175 (H) 06/28/2016   CHOL 221 (H) 06/28/2016   TRIG 172.0 (H) 06/28/2016   HDL 73.10 06/28/2016   LDLDIRECT 134.8 08/27/2010   LDLCALC 114 (H) 06/28/2016   ALT 12 06/28/2016   AST 12 06/28/2016   NA 139 06/28/2016   K 3.9 06/28/2016   CL 101 06/28/2016   CREATININE 0.96 06/28/2016   BUN 21 06/28/2016   CO2 27 06/28/2016   TSH 3.56 04/20/2015   HGBA1C 9.1 10/25/2016   MICROALBUR 148.2 (H) 06/28/2016    Dg Chest 2 View  Result Date: 10/25/2016 CLINICAL DATA:  Two days of cough and sinus congestion. Onset of headache today. History of diabetes, hypertension, morbid obesity. EXAM: CHEST  2 VIEW COMPARISON:  Report of a chest x-ray of December 17, 1998  FINDINGS: The lungs are adequately inflated and clear. The heart is normal in size. There is tortuosity of the ascending and descending thoracic aorta. There is no pleural effusion. The mediastinum is not abnormally widened. There is mild multilevel degenerative disc disease of the thoracic spine. IMPRESSION: There is no pneumonia, CHF, nor other acute cardiopulmonary abnormality. Under coiling of the thoracic aorta is consistent with atherosclerotic changes as well as chronic hypertension. Electronically Signed   By: David  Martinique M.D.   On: 10/25/2016 08:54    Assessment & Plan:   Adel was seen today for hypertension and diabetes.  Diagnoses and all orders for this visit:  Essential hypertension- her blood pressure is not well controlled but she is also already on 4 agents, I think the best approach to lower her blood pressure is to have her evaluated for possible sleep apnea.  Type 2 diabetes mellitus with complication, without long-term current use of insulin ([place])- her blood sugars are not adequately well controlled despite being on metformin and SGLT 2 inhibitor, I've asked her to add a GLP-1 agonist -     Exenatide ER (BYDUREON BCISE) 2 MG/0.85ML AUIJ; Inject 1 Act into the skin once a week. -     POCT Glucose (Device for Home Use)  Snoring -     Ambulatory referral to Pulmonology  Infective otitis externa of both ears- I've asked her to try Cortisporin Otic suspension to see if this helps with her symptoms  Other orders -     neomycin-polymyxin-hydrocortisone (CORTISPORIN) 3.5-10000-1 otic suspension; Place 3 drops into both ears 4 (four) times daily.   I have discontinued Ms. Lukach's HYDROcodone-homatropine. I am also having her start on Exenatide ER and neomycin-polymyxin-hydrocortisone. Additionally, I am having her maintain her aspirin, Vitamin D, cetirizine, oxybutynin, gabapentin, valsartan-hydrochlorothiazide, glucose blood, ACCU-CHEK AVIVA PLUS, ACCU-CHEK SOFTCLIX LANCETS,  metFORMIN, simvastatin, nebivolol, and canagliflozin.  Meds ordered this encounter  Medications  . Exenatide ER (BYDUREON BCISE) 2 MG/0.85ML AUIJ    Sig: Inject 1 Act into the skin once a week.    Dispense:  12 pen    Refill:  3  . neomycin-polymyxin-hydrocortisone (CORTISPORIN) 3.5-10000-1 otic suspension    Sig: Place 3 drops into both ears 4 (four) times daily.    Dispense:  10 mL    Refill:  1     Follow-up: Return in about 4 months (around 04/05/2017).  Scarlette Calico, MD

## 2016-12-06 NOTE — Patient Instructions (Signed)
Hypertension Hypertension, commonly called high blood pressure, is when the force of blood pumping through your arteries is too strong. Your arteries are the blood vessels that carry blood from your heart throughout your body. A blood pressure reading consists of a higher number over a lower number, such as 110/72. The higher number (systolic) is the pressure inside your arteries when your heart pumps. The lower number (diastolic) is the pressure inside your arteries when your heart relaxes. Ideally you want your blood pressure below 120/80. Hypertension forces your heart to work harder to pump blood. Your arteries may become narrow or stiff. Having untreated or uncontrolled hypertension can cause heart attack, stroke, kidney disease, and other problems. What increases the risk? Some risk factors for high blood pressure are controllable. Others are not. Risk factors you cannot control include:  Race. You may be at higher risk if you are African American.  Age. Risk increases with age.  Gender. Men are at higher risk than women before age 45 years. After age 65, women are at higher risk than men. Risk factors you can control include:  Not getting enough exercise or physical activity.  Being overweight.  Getting too much fat, sugar, calories, or salt in your diet.  Drinking too much alcohol. What are the signs or symptoms? Hypertension does not usually cause signs or symptoms. Extremely high blood pressure (hypertensive crisis) may cause headache, anxiety, shortness of breath, and nosebleed. How is this diagnosed? To check if you have hypertension, your health care provider will measure your blood pressure while you are seated, with your arm held at the level of your heart. It should be measured at least twice using the same arm. Certain conditions can cause a difference in blood pressure between your right and left arms. A blood pressure reading that is higher than normal on one occasion does  not mean that you need treatment. If it is not clear whether you have high blood pressure, you may be asked to return on a different day to have your blood pressure checked again. Or, you may be asked to monitor your blood pressure at home for 1 or more weeks. How is this treated? Treating high blood pressure includes making lifestyle changes and possibly taking medicine. Living a healthy lifestyle can help lower high blood pressure. You may need to change some of your habits. Lifestyle changes may include:  Following the DASH diet. This diet is high in fruits, vegetables, and whole grains. It is low in salt, red meat, and added sugars.  Keep your sodium intake below 2,300 mg per day.  Getting at least 30-45 minutes of aerobic exercise at least 4 times per week.  Losing weight if necessary.  Not smoking.  Limiting alcoholic beverages.  Learning ways to reduce stress. Your health care provider may prescribe medicine if lifestyle changes are not enough to get your blood pressure under control, and if one of the following is true:  You are 18-59 years of age and your systolic blood pressure is above 140.  You are 60 years of age or older, and your systolic blood pressure is above 150.  Your diastolic blood pressure is above 90.  You have diabetes, and your systolic blood pressure is over 140 or your diastolic blood pressure is over 90.  You have kidney disease and your blood pressure is above 140/90.  You have heart disease and your blood pressure is above 140/90. Your personal target blood pressure may vary depending on your medical   conditions, your age, and other factors. Follow these instructions at home:  Have your blood pressure rechecked as directed by your health care provider.  Take medicines only as directed by your health care provider. Follow the directions carefully. Blood pressure medicines must be taken as prescribed. The medicine does not work as well when you skip  doses. Skipping doses also puts you at risk for problems.  Do not smoke.  Monitor your blood pressure at home as directed by your health care provider. Contact a health care provider if:  You think you are having a reaction to medicines taken.  You have recurrent headaches or feel dizzy.  You have swelling in your ankles.  You have trouble with your vision. Get help right away if:  You develop a severe headache or confusion.  You have unusual weakness, numbness, or feel faint.  You have severe chest or abdominal pain.  You vomit repeatedly.  You have trouble breathing. This information is not intended to replace advice given to you by your health care provider. Make sure you discuss any questions you have with your health care provider. Document Released: 10/31/2005 Document Revised: 04/07/2016 Document Reviewed: 08/23/2013 Elsevier Interactive Patient Education  2017 [employer]  

## 2016-12-06 NOTE — Progress Notes (Signed)
Pre visit review using our clinic review tool, if applicable. No additional management support is needed unless otherwise documented below in the visit note. 

## 2016-12-07 ENCOUNTER — Encounter: Payer: Self-pay | Admitting: Internal Medicine

## 2016-12-08 ENCOUNTER — Other Ambulatory Visit: Payer: Self-pay | Admitting: Internal Medicine

## 2016-12-08 DIAGNOSIS — E118 Type 2 diabetes mellitus with unspecified complications: Secondary | ICD-10-CM

## 2016-12-08 DIAGNOSIS — N183 Chronic kidney disease, stage 3 unspecified: Secondary | ICD-10-CM

## 2016-12-08 DIAGNOSIS — I10 Essential (primary) hypertension: Secondary | ICD-10-CM

## 2016-12-13 ENCOUNTER — Telehealth: Payer: Self-pay | Admitting: Internal Medicine

## 2016-12-13 NOTE — Telephone Encounter (Signed)
Routing to dr jones, please advise, thanks 

## 2016-12-13 NOTE — Telephone Encounter (Signed)
Patient advised.

## 2016-12-13 NOTE — Telephone Encounter (Signed)
yes

## 2016-12-13 NOTE — Telephone Encounter (Signed)
Patient was given Bydureor Bcise 2mg  injections.  Patient states she had one injection last week and another injection today.  Patient would like to know if she is to continue this?

## 2016-12-23 ENCOUNTER — Encounter: Payer: Self-pay | Admitting: Pulmonary Disease

## 2016-12-23 ENCOUNTER — Ambulatory Visit (INDEPENDENT_AMBULATORY_CARE_PROVIDER_SITE_OTHER): Payer: Medicare PPO | Admitting: Pulmonary Disease

## 2016-12-23 VITALS — BP 160/96 | HR 74 | Ht 69.5 in | Wt 310.2 lb

## 2016-12-23 DIAGNOSIS — Z6841 Body Mass Index (BMI) 40.0 and over, adult: Secondary | ICD-10-CM

## 2016-12-23 DIAGNOSIS — R0683 Snoring: Secondary | ICD-10-CM

## 2016-12-23 NOTE — Patient Instructions (Signed)
Will arrange for home sleep study Will call to arrange for follow up after sleep study reviewed  

## 2016-12-23 NOTE — Progress Notes (Signed)
   Subjective:    Patient ID: Holly [name], female    DOB: 11-16-1940, 76 y.o.   MRN: [id number]  HPI    Review of Systems  Constitutional: Negative for fever and unexpected weight change.  HENT: Positive for ear pain. Negative for congestion, dental problem, nosebleeds, postnasal drip, rhinorrhea, sinus pressure, sneezing, sore throat and trouble swallowing.   Eyes: Negative for redness and itching.  Respiratory: Negative for cough, chest tightness, shortness of breath and wheezing.   Cardiovascular: Negative for palpitations and leg swelling.  Gastrointestinal: Negative for nausea and vomiting.  Genitourinary: Negative for dysuria.  Musculoskeletal: Negative for joint swelling.  Skin: Negative for rash.  Neurological: Negative for headaches.  Hematological: Does not bruise/bleed easily.  Psychiatric/Behavioral: Negative for dysphoric mood. The patient is not nervous/anxious.        Objective:   Physical Exam        Assessment & Plan:

## 2016-12-23 NOTE — Progress Notes (Signed)
Past Surgical History She  has a past surgical history that includes Tubal ligation (1979); Cholecystectomy (1989); and Knee arthroscopy (2010).  Allergies  Allergen Reactions  . Amlodipine Other (See Comments)    edema  . Penicillins Hives and Other (See Comments)    fever    Family History Her family history includes Aortic aneurysm in her brother; Heart disease in her mother; Kidney disease in her father.  Social History She  reports that she has quit smoking. Her smoking use included Cigarettes. She has a 12.50 pack-year smoking history. She has never used smokeless tobacco. She reports that she does not drink alcohol or use drugs.  Review of systems Constitutional: Negative for fever and unexpected weight change.  HENT: Positive for ear pain. Negative for congestion, dental problem, nosebleeds, postnasal drip, rhinorrhea, sinus pressure, sneezing, sore throat and trouble swallowing.   Eyes: Negative for redness and itching.  Respiratory: Negative for cough, chest tightness, shortness of breath and wheezing.   Cardiovascular: Negative for palpitations and leg swelling.  Gastrointestinal: Negative for nausea and vomiting.  Genitourinary: Negative for dysuria.  Musculoskeletal: Negative for joint swelling.  Skin: Negative for rash.  Neurological: Negative for headaches.  Hematological: Does not bruise/bleed easily.  Psychiatric/Behavioral: Negative for dysphoric mood. The patient is not nervous/anxious.     Current Outpatient Prescriptions on File Prior to Visit  Medication Sig  . ACCU-CHEK SOFTCLIX LANCETS lancets 1 each by Other route 2 (two) times daily. Use to check blood sugars twice a day Dx e11.9  . aspirin 81 MG EC tablet Take 81 mg by mouth daily.    . Blood Glucose Monitoring Suppl (ACCU-CHEK AVIVA PLUS) w/Device KIT Use to check blood sugars daily Dx E11.9  . canagliflozin (INVOKANA) 300 MG TABS tablet Take 1 tablet (300 mg total) by mouth daily.  . cetirizine  (ZYRTEC ALLERGY) 10 MG tablet Take 10 mg by mouth daily.    . Cholecalciferol (VITAMIN D) 400 UNIT capsule Take 400 Units by mouth daily.    . Exenatide ER (BYDUREON BCISE) 2 MG/0.85ML AUIJ Inject 1 Act into the skin once a week.  . gabapentin (NEURONTIN) 300 MG capsule nightly  . glucose blood (ACCU-CHEK AVIVA PLUS) test strip 1 each by Other route 2 (two) times daily. Use to check blood sugars twice a day Dx E11.9  . metFORMIN (GLUCOPHAGE) 1000 MG tablet TAKE ONE TABLET BY MOUTH TWICE DAILY FOR DIABETES CONTROL  . nebivolol (BYSTOLIC) 10 MG tablet Take 1 tablet (10 mg total) by mouth daily.  Marland Kitchen neomycin-polymyxin-hydrocortisone (CORTISPORIN) 3.5-10000-1 otic suspension Place 3 drops into both ears 4 (four) times daily.  Marland Kitchen oxybutynin (DITROPAN-XL) 5 MG 24 hr tablet   . simvastatin (ZOCOR) 40 MG tablet TAKE 1 TABLET EVERY DAY  . valsartan-hydrochlorothiazide (DIOVAN-HCT) 320-25 MG tablet TAKE ONE TABLET BY MOUTH ONCE DAILY   No current facility-administered medications on file prior to visit.     Chief Complaint  Patient presents with  . Sleep Consult    Referred by Dr Ronnald Ramp. Epworth Score: 6    Past medical history She  has a past medical history of Closed dislocation of shoulder; DJD (degenerative joint disease) of knee; DM (diabetes mellitus) ([place]); Hyperlipidemia; Hypertension; and Obesity, morbid ([place]).  Vital signs BP (!) 160/96 (BP Location: Left Arm, Cuff Size: Normal)   Pulse 74   Ht 5' 9.5" (1.765 m)   Wt (!) 310 lb 3.2 oz (140.7 kg)   SpO2 95%   BMI 45.15 kg/m   History  of Present Illness Holly [name] is a 76 y.o. female for evaluation of sleep problems.  She was seen by PCP.  She has HTN and DM.  She has snoring, and sleep disruption.  There was concern she could have sleep apnea.  She has been told she stops breathing while asleep.  She will wake up hearing herself snore.  She gets dry mouth at night, and wakes up in dreams frequently.    She goes to sleep at 8  pm pm.  She falls asleep after 1 hour of watching TV.  She wakes up 2 or 3 times to use the bathroom.  She gets out of bed at 6 am.  She feels okay in the morning.  She denies morning headache.  She does not use anything to help her fall sleep.  She drinks a cup of coffee in the morning.  She denies sleep walking, sleep talking, bruxism, or nightmares.  There is no history of restless legs.  She denies sleep hallucinations, sleep paralysis, or cataplexy.  The Epworth score is 6 out of 24.   Physical Exam:  General - No distress ENT - No sinus tenderness, no oral exudate, no LAN, no thyromegaly, TM clear, pupils equal/reactive, MP 4 Cardiac - s1s2 regular, no murmur, pulses symmetric Chest - No wheeze/rales/dullness, good air entry, normal respiratory excursion Back - No focal tenderness Abd - Soft, non-tender, no organomegaly, + bowel sounds Ext - No edema Neuro - Normal strength, cranial nerves intact Skin - No rashes Psych - Normal mood, and behavior  Discussion: She has snoring, sleep disruption, witnessed apnea, and daytime sleepiness.  She has hx or refractory HTN and DM.  Her BMI is > 35.  I am concerned she could have sleep apnea.  We discussed how sleep apnea can affect various health problems, including risks for hypertension, cardiovascular disease, and diabetes.  We also discussed how sleep disruption can increase risks for accidents, such as while driving.  Weight loss as a means of improving sleep apnea was also reviewed.  Additional treatment options discussed were CPAP therapy, oral appliance, and surgical intervention.   Assessment/plan:  Snoring with concern for obstructive sleep apnea. - will arrange for home sleep study  Obesity. - discussed importance of weight loss   Patient Instructions  Will arrange for home sleep study Will call to arrange for follow up after sleep study reviewed     Chesley Mires, M.D. Pager [phone] 12/23/2016, 11:31 AM

## 2017-01-09 DIAGNOSIS — Z7982 Long term (current) use of aspirin: Secondary | ICD-10-CM | POA: Diagnosis not present

## 2017-01-09 DIAGNOSIS — I10 Essential (primary) hypertension: Secondary | ICD-10-CM | POA: Diagnosis not present

## 2017-01-09 DIAGNOSIS — Z6841 Body Mass Index (BMI) 40.0 and over, adult: Secondary | ICD-10-CM | POA: Diagnosis not present

## 2017-01-09 DIAGNOSIS — E785 Hyperlipidemia, unspecified: Secondary | ICD-10-CM | POA: Diagnosis not present

## 2017-01-09 DIAGNOSIS — E119 Type 2 diabetes mellitus without complications: Secondary | ICD-10-CM | POA: Diagnosis not present

## 2017-01-09 DIAGNOSIS — Z008 Encounter for other general examination: Secondary | ICD-10-CM | POA: Diagnosis not present

## 2017-01-09 DIAGNOSIS — Z7984 Long term (current) use of oral hypoglycemic drugs: Secondary | ICD-10-CM | POA: Diagnosis not present

## 2017-01-25 DIAGNOSIS — G4733 Obstructive sleep apnea (adult) (pediatric): Secondary | ICD-10-CM | POA: Diagnosis not present

## 2017-02-03 ENCOUNTER — Encounter: Payer: Self-pay | Admitting: Pulmonary Disease

## 2017-02-03 ENCOUNTER — Telehealth: Payer: Self-pay | Admitting: Pulmonary Disease

## 2017-02-03 DIAGNOSIS — G4733 Obstructive sleep apnea (adult) (pediatric): Secondary | ICD-10-CM

## 2017-02-03 HISTORY — DX: Obstructive sleep apnea (adult) (pediatric): G47.33

## 2017-02-03 NOTE — Telephone Encounter (Signed)
HST 01/25/17 >> AHI 18.8, SaO2 low 81%   Will have my nurse inform pt that sleep study shows moderate sleep apnea.  Options are 1) CPAP now, 2) ROV first.  If She is agreeable to CPAP, then please send order for auto CPAP range 5 to 15 cm H2O with heated humidity and mask of choice.  Have download sent 1 month after starting CPAP and set up ROV 2 months after starting CPAP.  ROV can be with me or NP.

## 2017-02-06 DIAGNOSIS — G4733 Obstructive sleep apnea (adult) (pediatric): Secondary | ICD-10-CM | POA: Diagnosis not present

## 2017-02-08 ENCOUNTER — Other Ambulatory Visit: Payer: Self-pay | Admitting: *Deleted

## 2017-02-08 DIAGNOSIS — R0683 Snoring: Secondary | ICD-10-CM

## 2017-02-14 NOTE — Telephone Encounter (Signed)
Spoke with pt and advised results. She agreed to start CPAP. Order placed.

## 2017-02-15 NOTE — Telephone Encounter (Signed)
Called patient back to schedule 2 month ROV as it was no mentioned in telephone note. Pt states that an appt was not mentioned nor schedule, this has been taken care of, pt is scheduled for 05/03/17 at 4:30. Nothing further needed.

## 2017-02-17 DIAGNOSIS — G4733 Obstructive sleep apnea (adult) (pediatric): Secondary | ICD-10-CM | POA: Diagnosis not present

## 2017-02-28 ENCOUNTER — Encounter: Payer: Self-pay | Admitting: Gastroenterology

## 2017-03-07 ENCOUNTER — Other Ambulatory Visit: Payer: Self-pay | Admitting: Internal Medicine

## 2017-03-07 ENCOUNTER — Encounter: Payer: Self-pay | Admitting: Internal Medicine

## 2017-03-07 ENCOUNTER — Telehealth: Payer: Self-pay | Admitting: Internal Medicine

## 2017-03-07 ENCOUNTER — Ambulatory Visit (INDEPENDENT_AMBULATORY_CARE_PROVIDER_SITE_OTHER): Payer: Medicare PPO | Admitting: Internal Medicine

## 2017-03-07 VITALS — BP 148/82 | HR 74 | Temp 98.4°F | Resp 16 | Ht 69.5 in | Wt 317.0 lb

## 2017-03-07 DIAGNOSIS — Z794 Long term (current) use of insulin: Secondary | ICD-10-CM | POA: Diagnosis not present

## 2017-03-07 DIAGNOSIS — E118 Type 2 diabetes mellitus with unspecified complications: Secondary | ICD-10-CM

## 2017-03-07 DIAGNOSIS — I10 Essential (primary) hypertension: Secondary | ICD-10-CM

## 2017-03-07 LAB — POCT GLUCOSE (DEVICE FOR HOME USE): POC Glucose: 147 mg/dl — AB (ref 70–99)

## 2017-03-07 LAB — POCT GLYCOSYLATED HEMOGLOBIN (HGB A1C): Hemoglobin A1C: 6.4

## 2017-03-07 MED ORDER — NEBIVOLOL HCL 10 MG PO TABS: 10.0000 mg | ORAL_TABLET | Freq: Every day | ORAL | 1 refills | Status: DC

## 2017-03-07 NOTE — Progress Notes (Signed)
Pre visit review using our clinic review tool, if applicable. No additional management support is needed unless otherwise documented below in the visit note. 

## 2017-03-07 NOTE — Progress Notes (Signed)
Subjective:  Patient ID: Holly [name], female    DOB: Feb 10, 1941  Age: 76 y.o. MRN: [id number]  CC: Hypertension; Diabetes; and Annual Exam   HPI Holly [name] presents for f/up - she complains that bydureon is too expensive but offers no other complaints today.  Outpatient Medications Prior to Visit  Medication Sig Dispense Refill  . ACCU-CHEK SOFTCLIX LANCETS lancets 1 each by Other route 2 (two) times daily. Use to check blood sugars twice a day Dx e11.9 100 each 3  . aspirin 81 MG EC tablet Take 81 mg by mouth daily.      . Blood Glucose Monitoring Suppl (ACCU-CHEK AVIVA PLUS) w/Device KIT Use to check blood sugars daily Dx E11.9 1 kit 0  . canagliflozin (INVOKANA) 300 MG TABS tablet Take 1 tablet (300 mg total) by mouth daily. 90 tablet 1  . cetirizine (ZYRTEC ALLERGY) 10 MG tablet Take 10 mg by mouth daily.      . Cholecalciferol (VITAMIN D) 400 UNIT capsule Take 400 Units by mouth daily.      Marland Kitchen gabapentin (NEURONTIN) 300 MG capsule nightly 30 capsule 3  . glucose blood (ACCU-CHEK AVIVA PLUS) test strip 1 each by Other route 2 (two) times daily. Use to check blood sugars twice a day Dx E11.9 100 each 3  . oxybutynin (DITROPAN-XL) 5 MG 24 hr tablet     . simvastatin (ZOCOR) 40 MG tablet TAKE 1 TABLET EVERY DAY 90 tablet 3  . valsartan-hydrochlorothiazide (DIOVAN-HCT) 320-25 MG tablet TAKE ONE TABLET BY MOUTH ONCE DAILY 90 tablet 1  . Exenatide ER (BYDUREON BCISE) 2 MG/0.85ML AUIJ Inject 1 Act into the skin once a week. 12 pen 3  . metFORMIN (GLUCOPHAGE) 1000 MG tablet TAKE ONE TABLET BY MOUTH TWICE DAILY FOR DIABETES CONTROL 180 tablet 1  . nebivolol (BYSTOLIC) 10 MG tablet Take 1 tablet (10 mg total) by mouth daily. 49 tablet 0  . neomycin-polymyxin-hydrocortisone (CORTISPORIN) 3.5-10000-1 otic suspension Place 3 drops into both ears 4 (four) times daily. 10 mL 1   No facility-administered medications prior to visit.     ROS Review of Systems  Constitutional: Negative  for appetite change, chills, fatigue and fever.  HENT: Negative.   Eyes: Negative for visual disturbance.  Respiratory: Negative for cough, chest tightness, shortness of breath and wheezing.   Cardiovascular: Negative for chest pain, palpitations and leg swelling.  Gastrointestinal: Negative for abdominal pain, constipation, diarrhea, nausea and vomiting.  Endocrine: Negative.  Negative for cold intolerance, heat intolerance, polydipsia, polyphagia and polyuria.  Genitourinary: Negative.  Negative for difficulty urinating, dysuria and frequency.  Musculoskeletal: Negative.  Negative for myalgias.  Skin: Negative.   Allergic/Immunologic: Negative.   Neurological: Negative.  Negative for dizziness.  Hematological: Negative for adenopathy. Does not bruise/bleed easily.  Psychiatric/Behavioral: Negative.     Objective:  BP (!) 148/82 (BP Location: Left Arm, Patient Position: Sitting, Cuff Size: Large)   Pulse 74   Temp 98.4 F (36.9 C) (Oral)   Resp 16   Ht 5' 9.5" (1.765 m)   Wt (!) 317 lb (143.8 kg)   SpO2 98%   BMI 46.14 kg/m   BP Readings from Last 3 Encounters:  03/07/17 (!) 148/82  12/23/16 (!) 160/96  12/06/16 (!) 160/84    Wt Readings from Last 3 Encounters:  03/07/17 (!) 317 lb (143.8 kg)  12/23/16 (!) 310 lb 3.2 oz (140.7 kg)  12/06/16 (!) 319 lb 0.6 oz (144.7 kg)    Physical Exam  Constitutional:  She is oriented to person, place, and time. No distress.  HENT:  Mouth/Throat: Oropharynx is clear and moist. No oropharyngeal exudate.  Eyes: Conjunctivae are normal. Right eye exhibits no discharge. Left eye exhibits no discharge. No scleral icterus.  Neck: Normal range of motion. Neck supple. No JVD present. No tracheal deviation present. No thyromegaly present.  Cardiovascular: Normal rate, regular rhythm, normal heart sounds and intact distal pulses.  Exam reveals no gallop and no friction rub.   No murmur heard. Pulmonary/Chest: Effort normal and breath sounds  normal. No stridor. No respiratory distress. She has no wheezes. She has no rales. She exhibits no tenderness.  Abdominal: Soft. Bowel sounds are normal. She exhibits no distension and no mass. There is no tenderness. There is no rebound and no guarding.  Musculoskeletal: Normal range of motion. She exhibits no edema, tenderness or deformity.  Lymphadenopathy:    She has no cervical adenopathy.  Neurological: She is oriented to person, place, and time.  Skin: Skin is warm and dry. No rash noted. She is not diaphoretic. No erythema. No pallor.  Vitals reviewed.   Lab Results  Component Value Date   WBC 6.5 12/28/2015   HGB 13.8 12/28/2015   HCT 42.4 12/28/2015   PLT 224.0 12/28/2015   GLUCOSE 175 (H) 06/28/2016   CHOL 221 (H) 06/28/2016   TRIG 172.0 (H) 06/28/2016   HDL 73.10 06/28/2016   LDLDIRECT 134.8 08/27/2010   LDLCALC 114 (H) 06/28/2016   ALT 12 06/28/2016   AST 12 06/28/2016   NA 139 06/28/2016   K 3.9 06/28/2016   CL 101 06/28/2016   CREATININE 0.96 06/28/2016   BUN 21 06/28/2016   CO2 27 06/28/2016   TSH 3.56 04/20/2015   HGBA1C 6.4 03/07/2017   MICROALBUR 148.2 (H) 06/28/2016    Dg Chest 2 View  Result Date: 10/25/2016 CLINICAL DATA:  Two days of cough and sinus congestion. Onset of headache today. History of diabetes, hypertension, morbid obesity. EXAM: CHEST  2 VIEW COMPARISON:  Report of a chest x-ray of December 17, 1998 FINDINGS: The lungs are adequately inflated and clear. The heart is normal in size. There is tortuosity of the ascending and descending thoracic aorta. There is no pleural effusion. The mediastinum is not abnormally widened. There is mild multilevel degenerative disc disease of the thoracic spine. IMPRESSION: There is no pneumonia, CHF, nor other acute cardiopulmonary abnormality. Under coiling of the thoracic aorta is consistent with atherosclerotic changes as well as chronic hypertension. Electronically Signed   By: David  Martinique M.D.   On:  10/25/2016 08:54    Assessment & Plan:   Holly [name] was seen today for hypertension, diabetes and annual exam.  Diagnoses and all orders for this visit:  Essential hypertension- her BP is well controlled, I will monitor her lytes and renal fxn -     Basic metabolic panel; Future -     nebivolol (BYSTOLIC) 10 MG tablet; Take 1 tablet (10 mg total) by mouth daily.  Type 2 diabetes mellitus with complication, with long-term current use of insulin ([place])- her A1C is 6.4%, her blood sugar is well controlled, she will but buy bydureon anymore, will cont the other meds for BS control -     Basic metabolic panel; Future -     Cancel: Hemoglobin A1c; Future -     Ambulatory referral to Ophthalmology -     POCT glycosylated hemoglobin (Hb A1C) -     POCT Glucose (Device for Home Use)  I have discontinued Ms. Dougher's Exenatide ER and neomycin-polymyxin-hydrocortisone. I am also having her maintain her aspirin, Vitamin D, cetirizine, oxybutynin, gabapentin, glucose blood, ACCU-CHEK AVIVA PLUS, ACCU-CHEK SOFTCLIX LANCETS, simvastatin, canagliflozin, valsartan-hydrochlorothiazide, and nebivolol.  Meds ordered this encounter  Medications  . nebivolol (BYSTOLIC) 10 MG tablet    Sig: Take 1 tablet (10 mg total) by mouth daily.    Dispense:  90 tablet    Refill:  1     Follow-up: Return in about 6 months (around 09/06/2017).  Scarlette Calico, MD

## 2017-03-07 NOTE — Patient Instructions (Signed)

## 2017-03-07 NOTE — Telephone Encounter (Signed)
Pt ws given Bystolic, she likes the medication. Can she be prescibed this medication.  [facility] on [address]

## 2017-03-19 DIAGNOSIS — G4733 Obstructive sleep apnea (adult) (pediatric): Secondary | ICD-10-CM | POA: Diagnosis not present

## 2017-04-19 DIAGNOSIS — G4733 Obstructive sleep apnea (adult) (pediatric): Secondary | ICD-10-CM | POA: Diagnosis not present

## 2017-05-03 ENCOUNTER — Ambulatory Visit: Payer: Medicare PPO | Admitting: Pulmonary Disease

## 2017-05-19 DIAGNOSIS — G4733 Obstructive sleep apnea (adult) (pediatric): Secondary | ICD-10-CM | POA: Diagnosis not present

## 2017-05-29 ENCOUNTER — Encounter: Payer: Self-pay | Admitting: Acute Care

## 2017-05-29 ENCOUNTER — Ambulatory Visit (INDEPENDENT_AMBULATORY_CARE_PROVIDER_SITE_OTHER): Payer: Medicare PPO | Admitting: Acute Care

## 2017-05-29 VITALS — BP 116/80 | HR 77 | Ht 69.5 in | Wt 315.0 lb

## 2017-05-29 DIAGNOSIS — G4733 Obstructive sleep apnea (adult) (pediatric): Secondary | ICD-10-CM

## 2017-05-29 NOTE — Patient Instructions (Addendum)
We will place a DME order for sim card for CPAP for download. Place order for down Load within 30 days of receiving CPAP machine sim card. If you don't have a sim card in 1 week, please call the office so we can follow up. Continue on CPAP at bedtime. You appear to be benefiting from the treatment Goal is to wear for at least 4-6 hours each night for maximal clinical benefit. Continue to work on weight loss, as the link between excess weight  and sleep apnea is well established.  Clean mask, tubing and reservoir once weekly with soapy water. Do not drive if sleepy. Follow up with 3 months with Judson Roch, NP or before as needed.  Please contact office for sooner follow up if symptoms do not improve or worsen or seek emergency care  We will need follow up O&O after sleep apnea is addressed.

## 2017-05-29 NOTE — Progress Notes (Signed)
I have reviewed and agree with assessment/plan.  Chesley Mires, MD [facility] Pulmonary/Critical Care 05/29/2017, 1:08 PM Pager:  [phone]

## 2017-05-29 NOTE — Progress Notes (Signed)
History of Present Illness Holly [name] is a 76 y.o. female with OSA.She was started on CPAP 02/2017. She is followed by Dr. Halford Chessman.   05/29/2017 Follow up for compliance of CPAP use.She states she has been using her CPAP machine every night. She states she has had less daytime sleepiness. She states she is sleeping longer each night.She states she is getting at least 4 hours of sleep each night.She is clearly benefiting from treatment. Her machine does not have a sim card. She did call [employer] and ask for a card , and she was told some machines don't have a card. She denies chest pain, fever, orthopnea of hemoptysis.   Test Results: HST 01/25/17 >> AHI 18.8, SaO2 low 81% Placed on Auto CPAP 5-15 cm H2O  No download available  CBC Latest Ref Rng & Units 12/28/2015 11/10/2014 02/12/2010  WBC 4.0 - 10.5 K/uL 6.5 7.5 6.2  Hemoglobin 12.0 - 15.0 g/dL 13.8 13.5 14.1  Hematocrit 36.0 - 46.0 % 42.4 42.1 41.3  Platelets 150.0 - 400.0 K/uL 224.0 256.0 237.0    BMP Latest Ref Rng & Units 06/28/2016 12/28/2015 08/25/2015  Glucose 70 - 99 mg/dL 175(H) 164(H) 139(H)  BUN 6 - 23 mg/dL 21 23 26(H)  Creatinine 0.40 - 1.20 mg/dL 0.96 1.06 1.12  Sodium 135 - 145 mEq/L 139 139 138  Potassium 3.5 - 5.1 mEq/L 3.9 4.0 4.1  Chloride 96 - 112 mEq/L 101 103 101  CO2 19 - 32 mEq/L '27 29 26  ' Calcium 8.4 - 10.5 mg/dL 9.9 9.9 10.0      Past medical hx Past Medical History:  Diagnosis Date  . Closed dislocation of shoulder    unspecified site  . DJD (degenerative joint disease) of knee    Right knee  . DM (diabetes mellitus) ([place])   . Hyperlipidemia   . Hypertension   . Obesity, morbid ([place])   . OSA (obstructive sleep apnea) 02/03/2017     Social History  Substance Use Topics  . Smoking status: Former Smoker    Packs/day: 0.50    Years: 25.00    Types: Cigarettes  . Smokeless tobacco: Never Used  . Alcohol use No    Tobacco Cessation: Former smoker, no quit date noted in [place].  Past surgical  hx, Family hx, Social hx all reviewed.  Current Outpatient Prescriptions on File Prior to Visit  Medication Sig  . ACCU-CHEK SOFTCLIX LANCETS lancets 1 each by Other route 2 (two) times daily. Use to check blood sugars twice a day Dx e11.9  . aspirin 81 MG EC tablet Take 81 mg by mouth daily.    . Blood Glucose Monitoring Suppl (ACCU-CHEK AVIVA PLUS) w/Device KIT Use to check blood sugars daily Dx E11.9  . canagliflozin (INVOKANA) 300 MG TABS tablet Take 1 tablet (300 mg total) by mouth daily.  . cetirizine (ZYRTEC ALLERGY) 10 MG tablet Take 10 mg by mouth daily.    . Cholecalciferol (VITAMIN D) 400 UNIT capsule Take 400 Units by mouth daily.    Marland Kitchen gabapentin (NEURONTIN) 300 MG capsule nightly  . glucose blood (ACCU-CHEK AVIVA PLUS) test strip 1 each by Other route 2 (two) times daily. Use to check blood sugars twice a day Dx E11.9  . metFORMIN (GLUCOPHAGE) 1000 MG tablet TAKE ONE TABLET BY MOUTH TWICE DAILY FOR  DIABETES  CONTROL  . nebivolol (BYSTOLIC) 10 MG tablet Take 1 tablet (10 mg total) by mouth daily.  Marland Kitchen oxybutynin (DITROPAN-XL) 5 MG 24 hr tablet   .  simvastatin (ZOCOR) 40 MG tablet TAKE 1 TABLET EVERY DAY  . valsartan-hydrochlorothiazide (DIOVAN-HCT) 320-25 MG tablet TAKE ONE TABLET BY MOUTH ONCE DAILY   No current facility-administered medications on file prior to visit.      Allergies  Allergen Reactions  . Amlodipine Other (See Comments)    edema  . Penicillins Hives and Other (See Comments)    fever    Review Of Systems:  Constitutional:   No  weight loss, night sweats,  Fevers, chills, fatigue, or  lassitude.  HEENT:   No headaches,  Difficulty swallowing,  Tooth/dental problems, or  Sore throat,                No sneezing, itching, ear ache, nasal congestion, post nasal drip,   CV:  No chest pain,  Orthopnea, PND, swelling in lower extremities, anasarca, dizziness, palpitations, syncope.   GI  No heartburn, indigestion, abdominal pain, nausea, vomiting,  diarrhea, change in bowel habits, loss of appetite, bloody stools.   Resp: No shortness of breath with exertion or at rest.  No excess mucus, no productive cough,  No non-productive cough,  No coughing up of blood.  No change in color of mucus.  No wheezing.  No chest wall deformity  Skin: no rash or lesions.  GU: no dysuria, change in color of urine, no urgency or frequency.  No flank pain, no hematuria   MS:  No joint pain or swelling.  No decreased range of motion.  No back pain.  Psych:  No change in mood or affect. No depression or anxiety.  No memory loss.   Vital Signs BP 116/80 (BP Location: Left Arm, Patient Position: Sitting, Cuff Size: Normal)   Pulse 77   Ht 5' 9.5" (1.765 m)   Wt (!) 315 lb (142.9 kg)   SpO2 98%   BMI 45.85 kg/m    Physical Exam:  General- No distress,  A&Ox3, pleasant ENT: No sinus tenderness, TM clear, pale nasal mucosa, no oral exudate,no post nasal drip, no LAN Cardiac: S1, S2, regular rate and rhythm, no murmur Chest: No wheeze/ rales/ dullness; no accessory muscle use, no nasal flaring, no sternal retractions Abd.: Soft Non-tender, Non-distended, obese. Ext: No clubbing cyanosis, edema Neuro:  normal strength Skin: No rashes, warm and dry Psych: normal mood and behavior   Assessment/Plan  OSA (obstructive sleep apnea) OSA, initiated CPAP 02/2017 No down Load available 2/2 no Sim Card provided by DME Huey Romans) Plan: We will place a DME order for sim card for CPAP for download. Place order for down Load within 30 days of receiving CPAP machine sim card. If you don't have a sim card in 1 week, please call the office so we can follow up. Continue on CPAP at bedtime. You appear to be benefiting from the treatment Goal is to wear for at least 4-6 hours each night for maximal clinical benefit. Continue to work on weight loss, as the link between excess weight  and sleep apnea is well established.  Clean mask, tubing and reservoir once weekly  with soapy water. Do not drive if sleepy. Follow up with 3 months with Judson Roch, NP or before as needed.  Please contact office for sooner follow up if symptoms do not improve or worsen or seek emergency care  Will need O&O after next visit to assure OSA is better.     Magdalen Spatz, NP 05/29/2017  9:54 AM

## 2017-05-29 NOTE — Assessment & Plan Note (Addendum)
OSA, initiated CPAP 02/2017 No down Load available 2/2 no Sim Card provided by DME Huey Romans) Plan: We will place a DME order for sim card for CPAP for download. Place order for down Load within 30 days of receiving CPAP machine sim card. If you don't have a sim card in 1 week, please call the office so we can follow up. Continue on CPAP at bedtime. You appear to be benefiting from the treatment Goal is to wear for at least 4-6 hours each night for maximal clinical benefit. Continue to work on weight loss, as the link between excess weight  and sleep apnea is well established.  Clean mask, tubing and reservoir once weekly with soapy water. Do not drive if sleepy. Follow up with 3 months with Holly Roch, NP or before as needed.  Please contact office for sooner follow up if symptoms do not improve or worsen or seek emergency care  Will need O&O after next visit to assure OSA is better.

## 2017-05-30 DIAGNOSIS — G4733 Obstructive sleep apnea (adult) (pediatric): Secondary | ICD-10-CM | POA: Diagnosis not present

## 2017-05-31 ENCOUNTER — Telehealth: Payer: Self-pay | Admitting: Internal Medicine

## 2017-05-31 DIAGNOSIS — E118 Type 2 diabetes mellitus with unspecified complications: Secondary | ICD-10-CM

## 2017-05-31 MED ORDER — CANAGLIFLOZIN 300 MG PO TABS: 300.0000 mg | ORAL_TABLET | Freq: Every day | ORAL | 1 refills | Status: DC

## 2017-05-31 NOTE — Telephone Encounter (Signed)
Pt had samples of the invocona and would like Dr Ronnald Ramp to sent In a script to [facility] on [address]   300mg 

## 2017-05-31 NOTE — Telephone Encounter (Signed)
erx sent in

## 2017-06-05 ENCOUNTER — Telehealth: Payer: Self-pay | Admitting: Acute Care

## 2017-06-05 NOTE — Telephone Encounter (Signed)
We were unable to get a download on this machine the last time the patient came for an office visit. Please ask [employer]-to do whatever they need to do to the machine to make sure that we can get a download. I don't care if it's a sim  card, or enrollment for Internet access for downloads. We just need to be able to access data on the machine which we have been unable to do. Thank so much

## 2017-06-05 NOTE — Telephone Encounter (Signed)
Spoke with pt, advised that her machine is compatible with [employer] but that their website had been down Xseveral weeks, but it is now running and we have access to this.  Pt expressed understanding, was fine with this.  Pt is requesting the results of her cpap download.  Download printed and placed in SG's look-at folder.    SG please advise on recs.  Thanks!

## 2017-06-05 NOTE — Telephone Encounter (Signed)
Called and spoke with pt and she stated that [place] wanted her to call back if she never did receive the SD card.    I called [employer] and they stated that the pts machine that she has is able to do the DL remotely.  They stated that they can send her the card if she wanted but she was leaving that up to [place].  SG please advise. Thanks.

## 2017-06-05 NOTE — Telephone Encounter (Signed)
Spoke with pt,aware of recs.  Nothing further needed.  

## 2017-06-05 NOTE — Telephone Encounter (Signed)
Please call patient and let her know her usage was 87% 26 of 30 days. Please have her increase her usage  goal is 100% She used it for greater than 4 hours 22 days, and less than 4 hours for 4 days.  Usage hour average was 4 hours and 37 minutes per day On AutoSet 5-15 cm her events per hour 0.5, this is excellent. Average pressure was 5.3 cm of water She appears to be benefiting from CPAP therapy. Please encourage her to increase her usage to greater approximately 6 hours a night, and to have a goal of greater than 87% in regard to days used Thank so much

## 2017-06-19 DIAGNOSIS — G4733 Obstructive sleep apnea (adult) (pediatric): Secondary | ICD-10-CM | POA: Diagnosis not present

## 2017-06-28 DIAGNOSIS — M791 Myalgia: Secondary | ICD-10-CM | POA: Diagnosis not present

## 2017-06-28 DIAGNOSIS — M1711 Unilateral primary osteoarthritis, right knee: Secondary | ICD-10-CM | POA: Diagnosis not present

## 2017-06-28 DIAGNOSIS — M48061 Spinal stenosis, lumbar region without neurogenic claudication: Secondary | ICD-10-CM | POA: Diagnosis not present

## 2017-07-04 ENCOUNTER — Other Ambulatory Visit: Payer: Self-pay | Admitting: Internal Medicine

## 2017-07-04 ENCOUNTER — Telehealth: Payer: Self-pay | Admitting: Internal Medicine

## 2017-07-04 DIAGNOSIS — I10 Essential (primary) hypertension: Secondary | ICD-10-CM

## 2017-07-04 DIAGNOSIS — E118 Type 2 diabetes mellitus with unspecified complications: Secondary | ICD-10-CM

## 2017-07-04 MED ORDER — IRBESARTAN-HYDROCHLOROTHIAZIDE 300-12.5 MG PO TABS: 1.0000 | ORAL_TABLET | Freq: Every day | ORAL | 1 refills | Status: DC

## 2017-07-04 NOTE — Telephone Encounter (Signed)
Pt called stating she got a letter about her valsartan-hydrochlorothiazide (DIOVAN-HCT) 320-25 MG tablet  [facility] [address]-she wants to stay with generic Please advsie

## 2017-07-04 NOTE — Telephone Encounter (Signed)
Notified pt MD sent alternative BP med to [facility]...Holly [name]

## 2017-07-04 NOTE — Telephone Encounter (Signed)
changed

## 2017-07-13 DIAGNOSIS — M48061 Spinal stenosis, lumbar region without neurogenic claudication: Secondary | ICD-10-CM | POA: Diagnosis not present

## 2017-07-20 DIAGNOSIS — G4733 Obstructive sleep apnea (adult) (pediatric): Secondary | ICD-10-CM | POA: Diagnosis not present

## 2017-08-14 ENCOUNTER — Ambulatory Visit (INDEPENDENT_AMBULATORY_CARE_PROVIDER_SITE_OTHER): Payer: Medicare PPO | Admitting: *Deleted

## 2017-08-14 VITALS — BP 142/84 | HR 72 | Resp 20 | Ht 70.0 in | Wt 308.0 lb

## 2017-08-14 DIAGNOSIS — Z0000 Encounter for general adult medical examination without abnormal findings: Secondary | ICD-10-CM | POA: Diagnosis not present

## 2017-08-14 DIAGNOSIS — Z23 Encounter for immunization: Secondary | ICD-10-CM

## 2017-08-14 NOTE — Progress Notes (Signed)
Pre visit review using our clinic review tool, if applicable. No additional management support is needed unless otherwise documented below in the visit note. 

## 2017-08-14 NOTE — Patient Instructions (Addendum)
Please ask eye doctor during your upcoming appointment to send results to Dr. Ronnald Ramp.  Continue doing brain stimulating activities (puzzles, reading, adult coloring books, staying active) to keep memory sharp.   Continue to eat heart healthy diet (full of fruits, vegetables, whole grains, lean protein, water--limit salt, fat, and sugar intake) and increase physical activity as tolerated.   [name] , Thank you for taking time to come for your Medicare Wellness Visit. I appreciate your ongoing commitment to your health goals. Please review the following plan we discussed and let me know if I can assist you in the future.   These are the goals we discussed: Goals    . lose weight           Increase the amount of physical activity like chair exercises and continue decrease carbohydrates and sugar,  drink plenty of water, enjoy life and family.    . Weight < 300 lb (136.079 kg)          BMI ;48;  reviewed and educated Educated regarding online nutrition programs as GumSearch.nl and http://vang.com/  Plans to go back to Physician Weight loss with past success  [facility]   Address: [address], [place], [place] [address]  Phone: [phone]               This is a list of the screening recommended for you and due dates:  Health Maintenance  Topic Date Due  . Eye exam for diabetics  04/01/2016  . Colon Cancer Screening  04/29/2017  . Flu Shot  06/14/2017  . Hemoglobin A1C  09/06/2017  . Complete foot exam   03/07/2018  . Mammogram  08/23/2018  . Tetanus Vaccine  09/16/2018  . DEXA scan (bone density measurement)  Completed  . Pneumonia vaccines  Completed   Influenza Virus Vaccine injection What is this medicine? INFLUENZA VIRUS VACCINE (in floo EN zuh VAHY ruhs vak SEEN) helps to reduce the risk of getting influenza also known as the flu. The vaccine only helps protect you against some strains of the flu. This  medicine may be used for other purposes; ask your health care provider or pharmacist if you have questions. COMMON BRAND NAME(S): Afluria, Agriflu, Alfuria, FLUAD, Fluarix, Fluarix Quadrivalent, Flublok, Flublok Quadrivalent, FLUCELVAX, Flulaval, Fluvirin, Fluzone, Fluzone High-Dose, Fluzone Intradermal What should I tell my health care provider before I take this medicine? They need to know if you have any of these conditions: -bleeding disorder like hemophilia -fever or infection -Guillain-Barre syndrome or other neurological problems -immune system problems -infection with the human immunodeficiency virus (HIV) or AIDS -low blood platelet counts -multiple sclerosis -Holly [name] unusual or allergic reaction to influenza virus vaccine, latex, other medicines, foods, dyes, or preservatives. Different brands of vaccines contain different allergens. Some may contain latex or eggs. Talk to your doctor about your allergies to make sure that you get the right vaccine. -pregnant or trying to get pregnant -breast-feeding How should I use this medicine? This vaccine is for injection into a muscle or under the skin. It is given by a health care professional. A copy of Vaccine Information Statements will be given before each vaccination. Read this sheet carefully each time. The sheet may change frequently. Talk to your healthcare provider to see which vaccines are right for you. Some vaccines should not be used in all age groups. Overdosage: If you think you have taken too much of this medicine contact a poison control center  or emergency room at once. NOTE: This medicine is only for you. Do not share this medicine with others. What if I miss a dose? This does not apply. What may interact with this medicine? -chemotherapy or radiation therapy -medicines that lower your immune system like etanercept, anakinra, infliximab, and adalimumab -medicines that treat or prevent blood clots like  warfarin -phenytoin -steroid medicines like prednisone or cortisone -theophylline -vaccines This list may not describe all possible interactions. Give your health care provider a list of all the medicines, herbs, non-prescription drugs, or dietary supplements you use. Also tell them if you smoke, drink alcohol, or use illegal drugs. Some items may interact with your medicine. What should I watch for while using this medicine? Report any side effects that do not go away within 3 days to your doctor or health care professional. Call your health care provider if any unusual symptoms occur within 6 weeks of receiving this vaccine. You may still catch the flu, but the illness is not usually as bad. You cannot get the flu from the vaccine. The vaccine will not protect against colds or other illnesses that may cause fever. The vaccine is needed every year. What side effects may I notice from receiving this medicine? Side effects that you should report to your doctor or health care professional as soon as possible: -allergic reactions like skin rash, itching or hives, swelling of the face, lips, or tongue Side effects that usually do not require medical attention (report to your doctor or health care professional if they continue or are bothersome): -fever -headache -muscle aches and pains -pain, tenderness, redness, or swelling at the injection site -tiredness This list may not describe all possible side effects. Call your doctor for medical advice about side effects. You may report side effects to FDA at 1-800-FDA-1088. Where should I keep my medicine? The vaccine will be given by a health care professional in a clinic, pharmacy, doctor's office, or other health care setting. You will not be given vaccine doses to store at home. NOTE: This sheet is a summary. It may not cover all possible information. If you have questions about this medicine, talk to your doctor, pharmacist, or health care  provider.  2018 Elsevier/Gold Standard (2015-05-22 10:07:28)

## 2017-08-14 NOTE — Progress Notes (Signed)
Medical screening examination/treatment/procedure(s) were performed by non-physician practitioner and as supervising physician I was immediately available for consultation/collaboration. I agree with above. Elizabeth A Crawford, MD 

## 2017-08-14 NOTE — Progress Notes (Signed)
Subjective:   Holly [name] is a 76 y.o. female who presents for Medicare Annual (Subsequent) preventive examination.  Review of Systems:  No ROS.  Medicare Wellness Visit. Additional risk factors are reflected in the social history.  Cardiac Risk Factors include: advanced age (>63mn, >>74women);diabetes mellitus;dyslipidemia;hypertension;obesity (BMI >30kg/m2);sedentary lifestyle Sleep patterns: gets up 1-2 times nightly to void and sleeps 6-7 hours nightly.    Home Safety/Smoke Alarms: Feels safe in home. Smoke alarms in place.  Living environment; residence and Firearm Safety: 1-story house/ trailer, no firearmsLives with brother, no needs for DME, good support system  Seat Belt Safety/Bike Helmet: Wears seat belt.     Objective:     Vitals: BP (!) 142/84   Pulse 72   Resp 20   Ht '5\' 10"'  (1.778 m)   Wt (!) 308 lb (139.7 kg)   SpO2 98%   BMI 44.19 kg/m   Body mass index is 44.19 kg/m.   Tobacco History  Smoking Status  . Former Smoker  . Packs/day: 0.50  . Years: 25.00  . Types: Cigarettes  Smokeless Tobacco  . Never Used     Counseling given: Not Answered   Past Medical History:  Diagnosis Date  . Closed dislocation of shoulder    unspecified site  . DJD (degenerative joint disease) of knee    Right knee  . DM (diabetes mellitus) ([facility]   . Hyperlipidemia   . Hypertension   . Obesity, morbid ([facility]   . OSA (obstructive sleep apnea) 02/03/2017   Past Surgical History:  Procedure Laterality Date  . CHOLECYSTECTOMY  1989  . KNEE ARTHROSCOPY  2010   LEFT/ Dr DSharol Given . TUBAL LIGATION  1979   Family History  Problem Relation Age of Onset  . Heart disease Mother   . Kidney disease Father   . Aortic aneurysm Brother        Survived rupture   History  Sexual Activity  . Sexual activity: Not Currently    Outpatient Encounter Prescriptions as of 08/14/2017  Medication Sig  . ACCU-CHEK SOFTCLIX LANCETS lancets 1 each by Other route 2 (two) times  daily. Use to check blood sugars twice a day Dx e11.9  . aspirin 81 MG EC tablet Take 81 mg by mouth daily.    . Blood Glucose Monitoring Suppl (ACCU-CHEK AVIVA PLUS) w/Device KIT Use to check blood sugars daily Dx E11.9  . canagliflozin (INVOKANA) 300 MG TABS tablet Take 1 tablet (300 mg total) by mouth daily.  . cetirizine (ZYRTEC ALLERGY) 10 MG tablet Take 10 mg by mouth daily.    . Cholecalciferol (VITAMIN D) 400 UNIT capsule Take 400 Units by mouth daily.    . Cyanocobalamin (VITAMIN B-12 CR PO) Take 1 tablet by mouth daily.  .Marland Kitchenglucose blood (ACCU-CHEK AVIVA PLUS) test strip 1 each by Other route 2 (two) times daily. Use to check blood sugars twice a day Dx E11.9  . irbesartan-hydrochlorothiazide (AVALIDE) 300-12.5 MG tablet Take 1 tablet by mouth daily.  . metFORMIN (GLUCOPHAGE) 1000 MG tablet TAKE ONE TABLET BY MOUTH TWICE DAILY FOR  DIABETES  CONTROL  . nebivolol (BYSTOLIC) 10 MG tablet Take 1 tablet (10 mg total) by mouth daily.  . simvastatin (ZOCOR) 40 MG tablet TAKE 1 TABLET EVERY DAY  . [DISCONTINUED] gabapentin (NEURONTIN) 300 MG capsule nightly (Patient not taking: Reported on 08/14/2017)  . [DISCONTINUED] oxybutynin (DITROPAN-XL) 5 MG 24 hr tablet    No facility-administered encounter medications on file as of 08/14/2017.  Activities of Daily Living In your present state of health, do you have any difficulty performing the following activities: 08/14/2017  Hearing? N  Vision? N  Difficulty concentrating or making decisions? N  Walking or climbing stairs? N  Dressing or bathing? N  Doing errands, shopping? N  Preparing Food and eating ? N  Using the Toilet? N  In the past six months, have you accidently leaked urine? Y  Do you have problems with loss of bowel control? N  Managing your Medications? N  Managing your Finances? N  Housekeeping or managing your Housekeeping? N  Some recent data might be hidden    Patient Care Team: [name], MD as PCP -  General (Internal Medicine)    Assessment:    Physical assessment deferred to PCP.  Exercise Activities and Dietary recommendations Current Exercise Habits: The patient does not participate in regular exercise at present (Chair exercise pamphlets provided), Exercise limited by: orthopedic condition(s)  Diet (meal preparation, eat out, water intake, caffeinated beverages, dairy products, fruits and vegetables): in general, a "healthy" diet  , well balanced   Reviewed heart healthy and diabetic diet. Discussed weight loss tips and portion control. Diet education was provided via handout.  Goals    . lose weight           Increase the amount of physical activity like chair exercises and continue decrease carbohydrates and sugar,  drink plenty of water, enjoy life and family.    . Weight < 300 lb (136.079 kg)          BMI ;48;  reviewed and educated Educated regarding online nutrition programs as GumSearch.nl and http://vang.com/  Plans to go back to Physician Weight loss with past success  [facility]   Address: [address] [place], [place] [address]  Phone: [phone]              Fall Risk Fall Risk  08/14/2017 03/07/2017 10/21/2015 04/20/2015 04/09/2014  Falls in the past year? No No No No No  Risk for fall due to : Impaired balance/gait;Impaired mobility - - - -   Depression Screen PHQ 2/9 Scores 08/14/2017 03/07/2017 10/21/2015 04/20/2015  PHQ - 2 Score 0 0 0 0  PHQ- 9 Score 0 - - -     Cognitive Function MMSE - Mini Mental State Exam 08/14/2017 10/21/2015  Not completed: - (No Data)  Orientation to time 5 -  Orientation to Place 5 -  Registration 3 -  Attention/ Calculation 5 -  Recall 2 -  Language- name 2 objects 2 -  Language- repeat 1 -  Language- follow 3 step command 3 -  Language- read & follow direction 1 -  Write a sentence 1 -  Copy design 1 -  Total score 29 -        Immunization History    Administered Date(s) Administered  . Influenza Split 08/15/2012  . Influenza Whole 07/15/2008, 07/26/2010, 07/19/2011  . Influenza, High Dose Seasonal PF 09/04/2013, 09/27/2016, 08/14/2017  . Influenza,inj,Quad PF,6+ Mos 07/11/2014  . Influenza-Unspecified 08/15/2015  . Pneumococcal Conjugate-13 04/08/2014  . Pneumococcal Polysaccharide-23 02/14/2012  . Td 09/16/2008  . Zoster 07/09/2015   Screening Tests Health Maintenance  Topic Date Due  . OPHTHALMOLOGY EXAM  04/01/2016  . COLONOSCOPY  04/29/2017  . HEMOGLOBIN A1C  09/06/2017  . FOOT EXAM  03/07/2018  . MAMMOGRAM  08/23/2018  . TETANUS/TDAP  09/16/2018  . INFLUENZA VACCINE  Completed  .  DEXA SCAN  Completed  . PNA vac Low Risk Adult  Completed      Plan:    Please ask eye doctor during your upcoming appointment to send results to Dr. Ronnald Ramp.  Continue doing brain stimulating activities (puzzles, reading, adult coloring books, staying active) to keep memory sharp.   Continue to eat heart healthy diet (full of fruits, vegetables, whole grains, lean protein, water--limit salt, fat, and sugar intake) and increase physical activity as tolerated.  I have personally reviewed and noted the following in the patient's chart:   . Medical and social history . Use of alcohol, tobacco or illicit drugs  . Current medications and supplements . Functional ability and status . Nutritional status . Physical activity . Advanced directives . List of other physicians . Vitals . Screenings to include cognitive, depression, and falls . Referrals and appointments  In addition, I have reviewed and discussed with patient certain preventive protocols, quality metrics, and best practice recommendations. A written personalized care plan for preventive services as well as general preventive health recommendations were provided to patient.     Michiel Cowboy, RN  08/14/2017

## 2017-08-19 DIAGNOSIS — G4733 Obstructive sleep apnea (adult) (pediatric): Secondary | ICD-10-CM | POA: Diagnosis not present

## 2017-08-24 DIAGNOSIS — Z1231 Encounter for screening mammogram for malignant neoplasm of breast: Secondary | ICD-10-CM | POA: Diagnosis not present

## 2017-08-24 LAB — HM MAMMOGRAPHY

## 2017-08-25 ENCOUNTER — Encounter: Payer: Self-pay | Admitting: Internal Medicine

## 2017-08-31 ENCOUNTER — Ambulatory Visit (INDEPENDENT_AMBULATORY_CARE_PROVIDER_SITE_OTHER): Payer: Medicare PPO | Admitting: Acute Care

## 2017-08-31 ENCOUNTER — Encounter: Payer: Self-pay | Admitting: Acute Care

## 2017-08-31 VITALS — BP 126/88 | HR 74 | Ht 69.5 in | Wt 308.8 lb

## 2017-08-31 DIAGNOSIS — G4733 Obstructive sleep apnea (adult) (pediatric): Secondary | ICD-10-CM | POA: Diagnosis not present

## 2017-08-31 MED ORDER — PREDNISONE 10 MG PO TABS: ORAL_TABLET | ORAL | 0 refills | Status: DC

## 2017-08-31 MED ORDER — AMOXICILLIN-POT CLAVULANATE 875-125 MG PO TABS: 1.0000 | ORAL_TABLET | Freq: Two times a day (BID) | ORAL | 0 refills | Status: DC

## 2017-08-31 NOTE — Patient Instructions (Signed)
It is good to see you today. Continue on CPAP at bedtime. You appear to be benefiting from the treatment Goal is to wear for at least 6 hours each night for maximal clinical benefit. Continue to work on weight loss, as the link between excess weight  and sleep apnea is well established.  Do not drive if sleepy. Remember to clean mask, tubing, filter, and reservoir once weekly with soapy water.  Follow up with Dr. Halford Chessman  In 6 months or before as needed. Please contact office for sooner follow up if symptoms do not improve or worsen or seek emergency care

## 2017-08-31 NOTE — Assessment & Plan Note (Signed)
Good Compliance/ obvious benefit of therapy AHI of 0.9 Plan: Continue on CPAP at bedtime. You appear to be benefiting from the treatment Goal is to wear for at least 6 hours each night for maximal clinical benefit. Continue to work on weight loss, as the link between excess weight  and sleep apnea is well established.  Do not drive if sleepy. Remember to clean mask, tubing, filter, and reservoir once weekly with soapy water.  Follow up with Dr. Halford Chessman  In 6 months or before as needed. Please contact office for sooner follow up if symptoms do not improve or worsen or seek emergency care

## 2017-08-31 NOTE — Progress Notes (Signed)
History of Present Illness Holly [name] is a 76 y.o. female former smoker  with OSA.She was started on CPAP 02/2017. She is followed by Dr. Halford Chessman.   08/31/2017 CPAP Follow Up with Down Load: Pt. Presents for CPAP follow up. She was seen 05/29/2017 as follow up for initiation of CPAP therapy, but patient's machine had no SIM card and we had no way of obtaining a down load. She returns today for down Load and compliance check. Patient states she is doing well. She states she has less daytime sleepiness, and feels she is more focused during the day.She has no issues with equipment, or supplies. We reviewed the importance of CPAP compliance and cleaning her machine weekly with soapy water. She denies fever, chest pain, orthopnea or hemoptysis.    Test Results: Down Load 08/01/2017-08/31/2017>> AirSense 10 AutoSet 5-15 cm H2O Compliance 23 of 30 days > 4 hours 21 days < 4 hours 2 days Average usage 5 hours 21 minutes Median  pressure is 5.6 cm H2O Max pressure is 9.8 cm H2O AHI= 0.9   HST 01/25/17 >> AHI 18.8, SaO2 low 81% Placed on Auto CPAP 5-15 cm H2O  CBC Latest Ref Rng & Units 12/28/2015 11/10/2014 02/12/2010  WBC 4.0 - 10.5 K/uL 6.5 7.5 6.2  Hemoglobin 12.0 - 15.0 g/dL 13.8 13.5 14.1  Hematocrit 36.0 - 46.0 % 42.4 42.1 41.3  Platelets 150.0 - 400.0 K/uL 224.0 256.0 237.0    BMP Latest Ref Rng & Units 06/28/2016 12/28/2015 08/25/2015  Glucose 70 - 99 mg/dL 175(H) 164(H) 139(H)  BUN 6 - 23 mg/dL 21 23 26(H)  Creatinine 0.40 - 1.20 mg/dL 0.96 1.06 1.12  Sodium 135 - 145 mEq/L 139 139 138  Potassium 3.5 - 5.1 mEq/L 3.9 4.0 4.1  Chloride 96 - 112 mEq/L 101 103 101  CO2 19 - 32 mEq/L _0 Calcium 8.4 - 10.5 mg/dL 9.9 9.9 10.0       Past medical hx Past Medical History:  Diagnosis Date  . Closed dislocation of shoulder    unspecified site  . DJD (degenerative joint disease) of knee    Right knee  . DM (diabetes mellitus) ([place])   . Hyperlipidemia   . Hypertension   .  Obesity, morbid ([place])   . OSA (obstructive sleep apnea) 02/03/2017     Social History  Substance Use Topics  . Smoking status: Former Smoker    Packs/day: 0.50    Years: 25.00    Types: Cigarettes  . Smokeless tobacco: Never Used  . Alcohol use No    Ms.Liotta reports that she has quit smoking. Her smoking use included Cigarettes. She has a 12.50 pack-year smoking history. She has never used smokeless tobacco. She reports that she does not drink alcohol or use drugs.  Tobacco Cessation: Former smoker  Past surgical hx, Family hx, Social hx all reviewed.  Current Outpatient Prescriptions on File Prior to Visit  Medication Sig  . ACCU-CHEK SOFTCLIX LANCETS lancets 1 each by Other route 2 (two) times daily. Use to check blood sugars twice a day Dx e11.9  . aspirin 81 MG EC tablet Take 81 mg by mouth daily.    . Blood Glucose Monitoring Suppl (ACCU-CHEK AVIVA PLUS) w/Device KIT Use to check blood sugars daily Dx E11.9  . canagliflozin (INVOKANA) 300 MG TABS tablet Take 1 tablet (300 mg total) by mouth daily.  . cetirizine (ZYRTEC ALLERGY) 10 MG tablet Take 10 mg by mouth daily.    Marland Kitchen  Cholecalciferol (VITAMIN D) 400 UNIT capsule Take 400 Units by mouth daily.    . Cyanocobalamin (VITAMIN B-12 CR PO) Take 1 tablet by mouth daily.  Marland Kitchen glucose blood (ACCU-CHEK AVIVA PLUS) test strip 1 each by Other route 2 (two) times daily. Use to check blood sugars twice a day Dx E11.9  . irbesartan-hydrochlorothiazide (AVALIDE) 300-12.5 MG tablet Take 1 tablet by mouth daily.  . metFORMIN (GLUCOPHAGE) 1000 MG tablet TAKE ONE TABLET BY MOUTH TWICE DAILY FOR  DIABETES  CONTROL  . nebivolol (BYSTOLIC) 10 MG tablet Take 1 tablet (10 mg total) by mouth daily.  . simvastatin (ZOCOR) 40 MG tablet TAKE 1 TABLET EVERY DAY   No current facility-administered medications on file prior to visit.      Allergies  Allergen Reactions  . Amlodipine Other (See Comments)    edema  . Oxycodone     nausea  .  Penicillins Hives and Other (See Comments)    fever    Review Of Systems:  Constitutional:   No  weight loss, night sweats,  Fevers, chills, fatigue, or  lassitude.  HEENT:   No headaches,  Difficulty swallowing,  Tooth/dental problems, or  Sore throat,                No sneezing, itching, ear ache, nasal congestion, post nasal drip,   CV:  No chest pain,  Orthopnea, PND, swelling in lower extremities, anasarca, dizziness, palpitations, syncope.   GI  No heartburn, indigestion, abdominal pain, nausea, vomiting, diarrhea, change in bowel habits, loss of appetite, bloody stools.   Resp: No shortness of breath with exertion or at rest.  No excess mucus, no productive cough,  No non-productive cough,  No coughing up of blood.  No change in color of mucus.  No wheezing.  No chest wall deformity  Skin: no rash or lesions.  GU: no dysuria, change in color of urine, no urgency or frequency.  No flank pain, no hematuria   MS:  No joint pain or swelling.  No decreased range of motion.  No back pain.  Psych:  No change in mood or affect. No depression or anxiety.  No memory loss.   Vital Signs BP 126/88 (BP Location: Left Arm, Cuff Size: Normal)   Pulse 74   Ht 5' 9.5" (1.765 m)   Wt (!) 308 lb 12.8 oz (140.1 kg)   SpO2 98%   BMI 44.95 kg/m    Physical Exam:  General- No distress,  A&Ox3, pleasant ENT: No sinus tenderness, TM clear, pale nasal mucosa, no oral exudate,no post nasal drip, no LAN Cardiac: S1, S2, regular rate and rhythm, no murmur Chest: No wheeze/ rales/ dullness; no accessory muscle use, no nasal flaring, no sternal retractions Abd.: Soft Non-tender, obese Ext: No clubbing cyanosis, edema Neuro:  normal strength, cranial nerves intact Skin: No rashes, warm and dry Psych: normal mood and behavior   Assessment/Plan  OSA (obstructive sleep apnea) Good Compliance/ obvious benefit of therapy AHI of 0.9 Plan: Continue on CPAP at bedtime. You appear to be  benefiting from the treatment Goal is to wear for at least 6 hours each night for maximal clinical benefit. Continue to work on weight loss, as the link between excess weight  and sleep apnea is well established.  Do not drive if sleepy. Remember to clean mask, tubing, filter, and reservoir once weekly with soapy water.  Follow up with Dr. Halford Chessman  In 6 months or before as needed. Please contact office for  sooner follow up if symptoms do not improve or worsen or seek emergency care       Magdalen Spatz, NP 08/31/2017  9:57 AM

## 2017-09-01 NOTE — Progress Notes (Signed)
I have reviewed and agree with assessment/plan.  Chesley Mires, MD [facility]monary[facility]Critical [facility] 09/01/2017, 10:37 AM Pager:  [phone]

## 2017-09-04 ENCOUNTER — Other Ambulatory Visit: Payer: Self-pay | Admitting: Internal Medicine

## 2017-09-04 DIAGNOSIS — I10 Essential (primary) hypertension: Secondary | ICD-10-CM

## 2017-09-06 ENCOUNTER — Other Ambulatory Visit: Payer: Self-pay | Admitting: Internal Medicine

## 2017-09-06 DIAGNOSIS — Z1212 Encounter for screening for malignant neoplasm of rectum: Secondary | ICD-10-CM | POA: Diagnosis not present

## 2017-09-06 DIAGNOSIS — Z1211 Encounter for screening for malignant neoplasm of colon: Secondary | ICD-10-CM | POA: Diagnosis not present

## 2017-09-06 LAB — COLOGUARD: COLOGUARD: NEGATIVE

## 2017-09-08 ENCOUNTER — Telehealth: Payer: Self-pay | Admitting: Internal Medicine

## 2017-09-08 DIAGNOSIS — I10 Essential (primary) hypertension: Secondary | ICD-10-CM

## 2017-09-11 ENCOUNTER — Other Ambulatory Visit: Payer: Self-pay | Admitting: Internal Medicine

## 2017-09-11 DIAGNOSIS — I10 Essential (primary) hypertension: Secondary | ICD-10-CM

## 2017-09-12 MED ORDER — NEBIVOLOL HCL 10 MG PO TABS: 10.0000 mg | ORAL_TABLET | Freq: Every day | ORAL | 1 refills | Status: DC

## 2017-09-12 MED ORDER — METFORMIN HCL 1000 MG PO TABS: 1000.0000 mg | ORAL_TABLET | Freq: Two times a day (BID) | ORAL | 1 refills | Status: DC

## 2017-09-12 NOTE — Telephone Encounter (Signed)
Per chart MD addiditant sent metformin on 09/08/17 received confirmation that pharmacy received. Resent both med to [employer] [facility]...lmb

## 2017-09-12 NOTE — Addendum Note (Signed)
Addended by: Earnstine Regal on: 09/12/2017 02:04 PM   Modules accepted: Orders

## 2017-09-12 NOTE — Telephone Encounter (Signed)
Pt called in and said that pharmacy does not have her metformin or her bystolic

## 2017-09-14 LAB — COLOGUARD: COLOGUARD: NEGATIVE

## 2017-09-15 LAB — HM DIABETES EYE EXAM

## 2017-09-19 DIAGNOSIS — G4733 Obstructive sleep apnea (adult) (pediatric): Secondary | ICD-10-CM | POA: Diagnosis not present

## 2017-10-19 DIAGNOSIS — G4733 Obstructive sleep apnea (adult) (pediatric): Secondary | ICD-10-CM | POA: Diagnosis not present

## 2017-11-19 DIAGNOSIS — G4733 Obstructive sleep apnea (adult) (pediatric): Secondary | ICD-10-CM | POA: Diagnosis not present

## 2017-12-03 IMAGING — DX DG CHEST 2V
2 series · 2 of 2 positions shown · non-contrast
Comparison: Report of a chest x-ray December 17, 1998

CLINICAL DATA: Two days of cough and sinus congestion. Onset of
headache today. History of diabetes, hypertension, morbid obesity.

EXAM:
CHEST  2 VIEW

[chest pa]
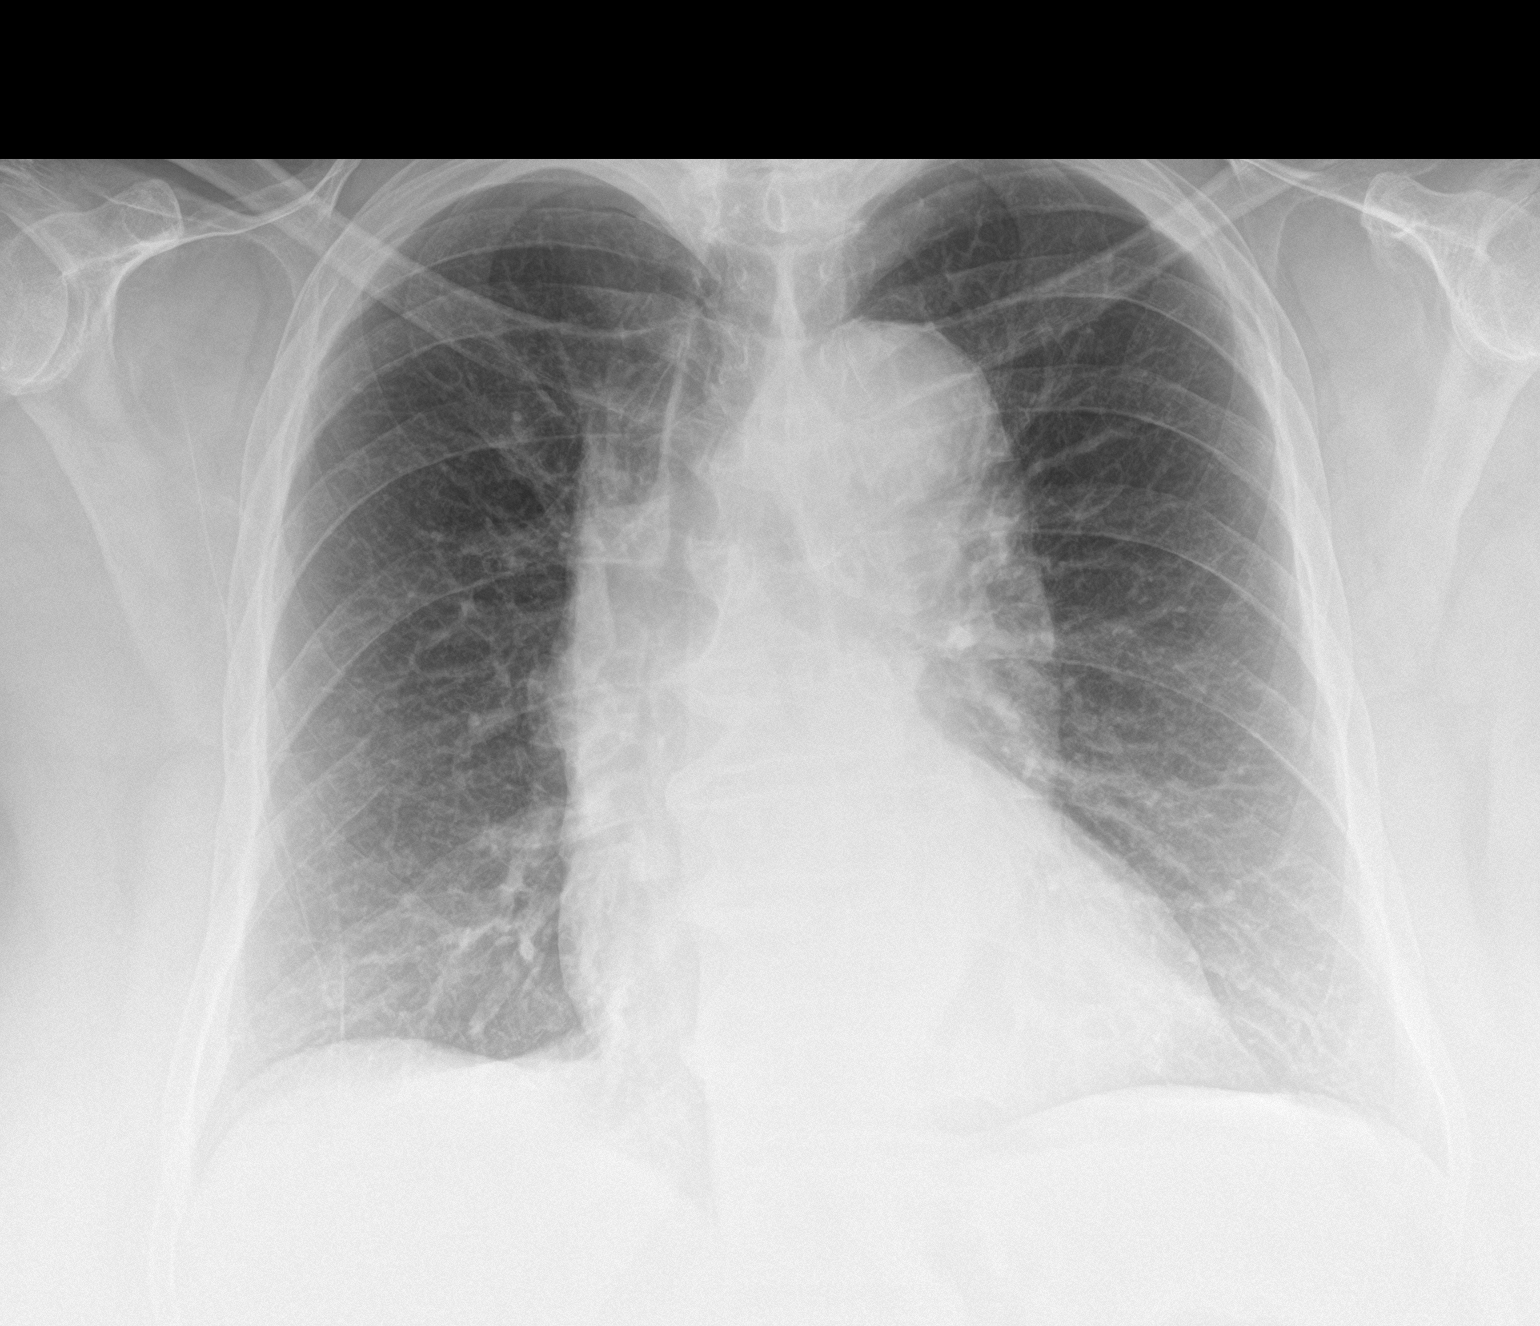

[chest lat]
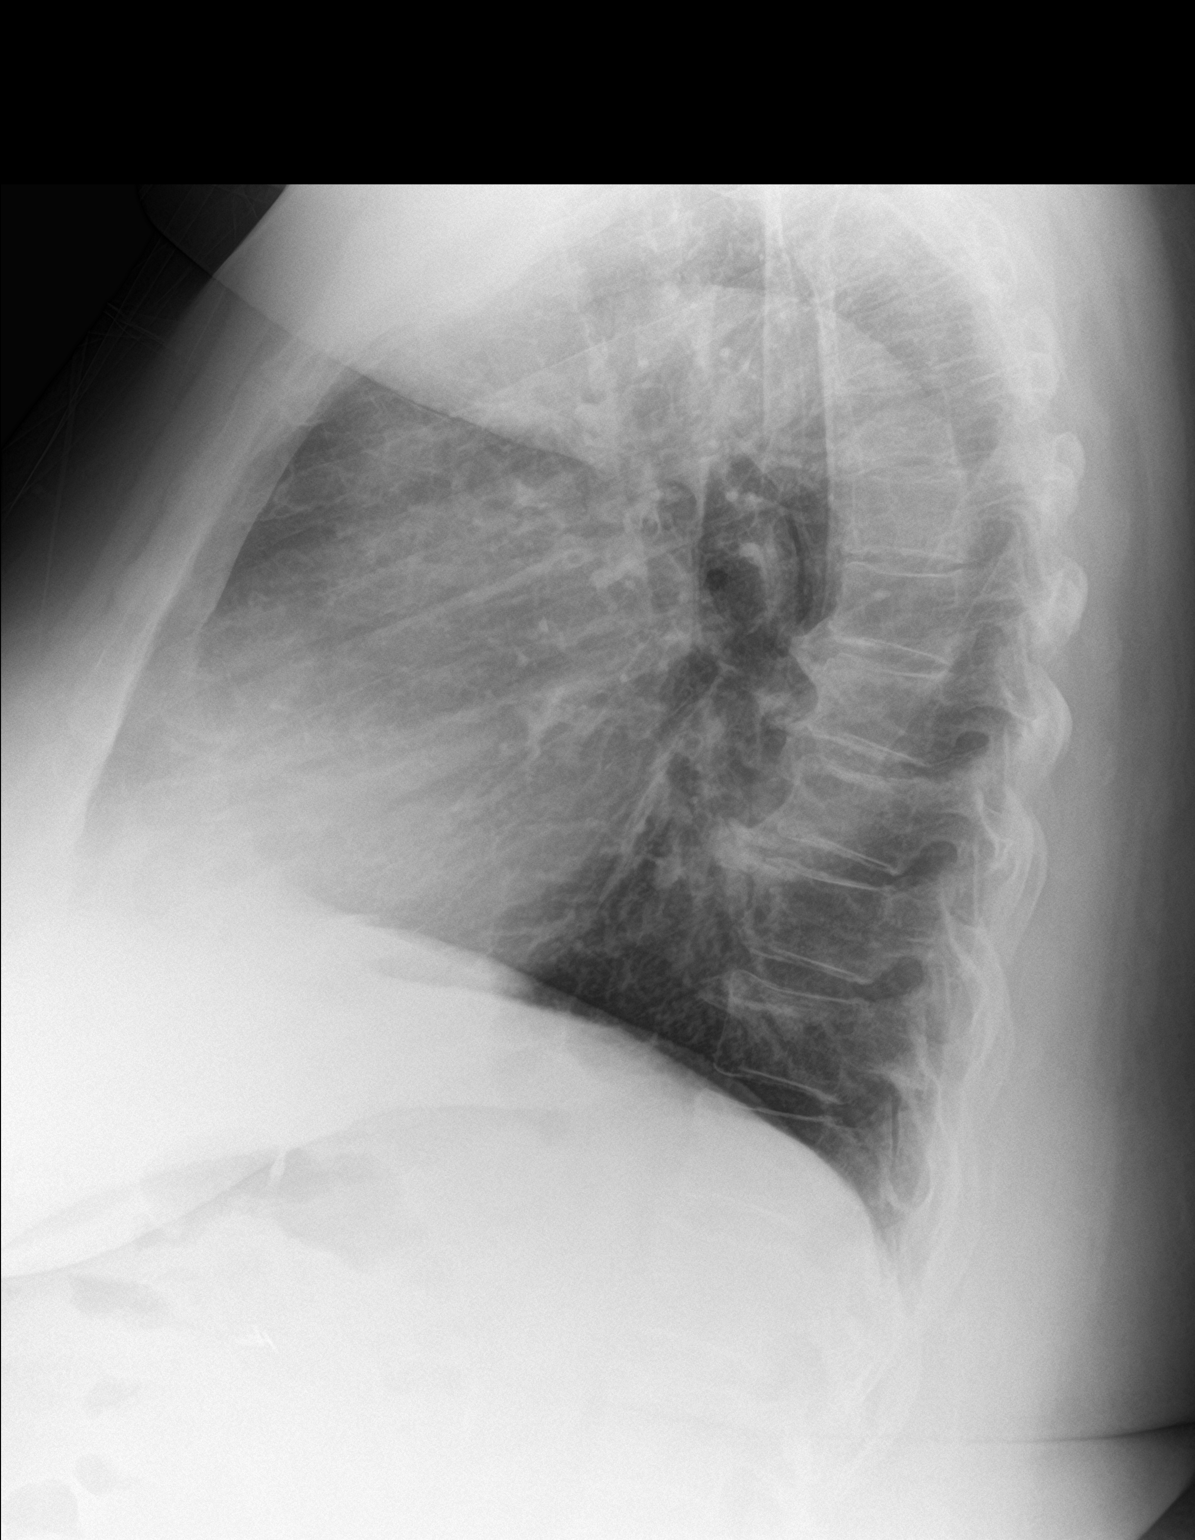

[2 of 2 positions shown; findings below may reference images not displayed]

FINDINGS: The lungs are adequately inflated and clear. The heart is normal in
size. There is tortuosity of the ascending and descending thoracic
aorta. There is no pleural effusion. The mediastinum is not
abnormally widened. There is mild multilevel degenerative disc
disease of the thoracic spine.
IMPRESSION: There is no pneumonia, CHF, nor other acute cardiopulmonary
abnormality. Under coiling of the thoracic aorta is consistent with
atherosclerotic changes as well as chronic hypertension.

## 2017-12-12 ENCOUNTER — Other Ambulatory Visit: Payer: Self-pay | Admitting: Internal Medicine

## 2017-12-18 ENCOUNTER — Telehealth: Payer: Self-pay | Admitting: Internal Medicine

## 2017-12-18 ENCOUNTER — Other Ambulatory Visit: Payer: Self-pay | Admitting: Internal Medicine

## 2017-12-18 DIAGNOSIS — I10 Essential (primary) hypertension: Secondary | ICD-10-CM

## 2017-12-18 DIAGNOSIS — E118 Type 2 diabetes mellitus with unspecified complications: Secondary | ICD-10-CM

## 2017-12-18 MED ORDER — TELMISARTAN-HCTZ 80-12.5 MG PO TABS: 1.0000 | ORAL_TABLET | Freq: Every day | ORAL | 0 refills | Status: DC

## 2017-12-18 NOTE — Telephone Encounter (Signed)
Notified pt w/MD response.../lmb 

## 2017-12-18 NOTE — Telephone Encounter (Signed)
Copied from [place]. Topic: Inquiry >> Dec 18, 2017  9:48 AM Corie Chiquito, NT wrote: Reason for CRM: Patient calling because her Irbesartan 12.5mg  and Invokana 300mg  are on recall and she would like to know if Dr.Jones could call her in different medications. Patient also stated that she is out of her bp medication and needs this taking care of asap. She would like the medication to be sent to the [facility] in [place] [address] [phone]

## 2017-12-18 NOTE — Telephone Encounter (Signed)
Irbesartan/HCT has been changed to telmisartan/HCT  I do not think Invokana has been recalled

## 2017-12-19 ENCOUNTER — Telehealth: Payer: Self-pay

## 2017-12-19 NOTE — Telephone Encounter (Signed)
Key: [id number]   Prior authorization started today on cover my meds for Telmisartin HCTZ

## 2017-12-20 DIAGNOSIS — G4733 Obstructive sleep apnea (adult) (pediatric): Secondary | ICD-10-CM | POA: Diagnosis not present

## 2017-12-21 ENCOUNTER — Telehealth: Payer: Self-pay

## 2017-12-21 ENCOUNTER — Other Ambulatory Visit: Payer: Self-pay | Admitting: Internal Medicine

## 2017-12-21 ENCOUNTER — Ambulatory Visit: Payer: Medicare PPO | Admitting: Internal Medicine

## 2017-12-21 DIAGNOSIS — E118 Type 2 diabetes mellitus with unspecified complications: Secondary | ICD-10-CM

## 2017-12-21 DIAGNOSIS — I10 Essential (primary) hypertension: Secondary | ICD-10-CM

## 2017-12-21 MED ORDER — LOSARTAN POTASSIUM-HCTZ 100-12.5 MG PO TABS: 1.0000 | ORAL_TABLET | Freq: Every day | ORAL | 1 refills | Status: DC

## 2017-12-22 ENCOUNTER — Telehealth: Payer: Self-pay | Admitting: Internal Medicine

## 2017-12-22 NOTE — Telephone Encounter (Signed)
Copied from [place]. Topic: Quick Communication - See Telephone Encounter >> Dec 22, 2017 11:12 AM Bea Graff, NT wrote: CRM for notification. See Telephone encounter for: [place] calling needing additional information to process a PA request for this pt. He is needing clinical information. CB#: [phone] Ref#: [id number]  12/22/17.

## 2017-12-22 NOTE — Telephone Encounter (Signed)
Can the losartan-HCTZ be split into two medications.

## 2017-12-22 NOTE — Telephone Encounter (Signed)
lvm for [employer] at the number listed.

## 2017-12-22 NOTE — Telephone Encounter (Signed)
Copied from [place]. Topic: Quick Communication - See Telephone Encounter >> Dec 22, 2017 11:12 AM Bea Graff, NT wrote: CRM for notification. See Telephone encounter for: [place] calling needing additional information to process a PA request for this pt. He is needing clinical information. CB#: [phone] Ref#: [id number]  12/22/17. >> Dec 22, 2017  3:10 PM Neva Seat wrote: Elizbeth Squires - [employer]  Telmisartan was approved.

## 2017-12-22 NOTE — Telephone Encounter (Signed)
Pt called and states that the losartan was going to be to expensive and she wants to see if she still needs to get his medication but wait for the PA to go through, or can the doctor order a different medication? Please contact pt.

## 2017-12-22 NOTE — Telephone Encounter (Signed)
Tried to call the CB number. Left message for [employer] to call back.

## 2017-12-25 ENCOUNTER — Encounter: Payer: Self-pay | Admitting: Internal Medicine

## 2017-12-25 ENCOUNTER — Ambulatory Visit: Payer: Medicare PPO | Admitting: Internal Medicine

## 2017-12-25 ENCOUNTER — Other Ambulatory Visit (INDEPENDENT_AMBULATORY_CARE_PROVIDER_SITE_OTHER): Payer: Medicare PPO

## 2017-12-25 VITALS — BP 140/88 | HR 60 | Temp 98.2°F | Ht 69.5 in | Wt 318.0 lb

## 2017-12-25 DIAGNOSIS — I10 Essential (primary) hypertension: Secondary | ICD-10-CM

## 2017-12-25 DIAGNOSIS — I70211 Atherosclerosis of native arteries of extremities with intermittent claudication, right leg: Secondary | ICD-10-CM

## 2017-12-25 DIAGNOSIS — M79661 Pain in right lower leg: Secondary | ICD-10-CM

## 2017-12-25 DIAGNOSIS — E118 Type 2 diabetes mellitus with unspecified complications: Secondary | ICD-10-CM | POA: Diagnosis not present

## 2017-12-25 DIAGNOSIS — E785 Hyperlipidemia, unspecified: Secondary | ICD-10-CM

## 2017-12-25 DIAGNOSIS — Z1211 Encounter for screening for malignant neoplasm of colon: Secondary | ICD-10-CM

## 2017-12-25 DIAGNOSIS — Z794 Long term (current) use of insulin: Secondary | ICD-10-CM | POA: Diagnosis not present

## 2017-12-25 DIAGNOSIS — M7989 Other specified soft tissue disorders: Secondary | ICD-10-CM

## 2017-12-25 LAB — COMPREHENSIVE METABOLIC PANEL
ALBUMIN: 4 g/dL (ref 3.5–5.2)
ALK PHOS: 69 U/L (ref 39–117)
ALT: 13 U/L (ref 0–35)
AST: 14 U/L (ref 0–37)
BILIRUBIN TOTAL: 1.5 mg/dL — AB (ref 0.2–1.2)
BUN: 18 mg/dL (ref 6–23)
CO2: 29 mEq/L (ref 19–32)
Calcium: 9.5 mg/dL (ref 8.4–10.5)
Chloride: 102 mEq/L (ref 96–112)
Creatinine, Ser: 0.96 mg/dL (ref 0.40–1.20)
GFR: 72.94 mL/min (ref >60.00)
GLUCOSE: 163 mg/dL — AB (ref 70–99)
Potassium: 3.6 mEq/L (ref 3.5–5.1)
SODIUM: 141 meq/L (ref 135–145)
TOTAL PROTEIN: 7 g/dL (ref 6.0–8.3)

## 2017-12-25 LAB — BASIC METABOLIC PANEL
BUN: 18 mg/dL (ref 6–23)
CO2: 29 mEq/L (ref 19–32)
CREATININE: 0.96 mg/dL (ref 0.40–1.20)
Calcium: 9.5 mg/dL (ref 8.4–10.5)
Chloride: 102 mEq/L (ref 96–112)
GFR: 72.94 mL/min (ref >60.00)
Glucose, Bld: 163 mg/dL — ABNORMAL HIGH (ref 70–99)
Potassium: 3.6 mEq/L (ref 3.5–5.1)
Sodium: 141 mEq/L (ref 135–145)

## 2017-12-25 LAB — LIPID PANEL
CHOLESTEROL: 209 mg/dL — AB (ref 0–200)
HDL: 65.5 mg/dL (ref >39.00)
LDL Cholesterol: 117 mg/dL — ABNORMAL HIGH (ref 0–99)
NONHDL: 143.87
Total CHOL/HDL Ratio: 3
Triglycerides: 136 mg/dL (ref 0.0–149.0)
VLDL: 27.2 mg/dL (ref 0.0–40.0)

## 2017-12-25 LAB — TSH: TSH: 2.68 (ref 0.41–5.90)

## 2017-12-25 LAB — CBC AND DIFFERENTIAL
HCT: 42 (ref 36–46)
Hemoglobin: 13.6 (ref 12.0–16.0)
Platelets: 230 (ref 150–399)
WBC: 5.4

## 2017-12-25 LAB — HEMOGLOBIN A1C: Hgb A1c MFr Bld: 7.4 % — ABNORMAL HIGH (ref 4.6–6.5)

## 2017-12-25 MED ORDER — CANAGLIFLOZIN 300 MG PO TABS: 300.0000 mg | ORAL_TABLET | Freq: Every day | ORAL | 1 refills | Status: DC

## 2017-12-25 NOTE — Progress Notes (Signed)
Subjective:  Patient ID: Holly [name], female    DOB: 05/18/1941  Age: 77 y.o. MRN: [id number]  CC: Hypertension; Hyperlipidemia; and Diabetes   HPI Holly [name] presents for f/up - She complains of a several week history of pain and swelling in her right foot.  Says her symptoms are not related to activity and she denies claudication.  The right side is more swollen than the left side.  She thinks her blood pressure and blood sugar have been well controlled.  She denies any recent episodes of headache/blurred vision/chest pain/shortness of breath/palpitations/fatigue.  Outpatient Medications Prior to Visit  Medication Sig Dispense Refill  . ACCU-CHEK SOFTCLIX LANCETS lancets 1 each by Other route 2 (two) times daily. Use to check blood sugars twice a day Dx e11.9 100 each 3  . aspirin 81 MG EC tablet Take 81 mg by mouth daily.      . Blood Glucose Monitoring Suppl (ACCU-CHEK AVIVA PLUS) w/Device KIT Use to check blood sugars daily Dx E11.9 1 kit 0  . cetirizine (ZYRTEC ALLERGY) 10 MG tablet Take 10 mg by mouth daily.      . Cholecalciferol (VITAMIN D) 400 UNIT capsule Take 400 Units by mouth daily.      . Cyanocobalamin (VITAMIN B-12 CR PO) Take 1 tablet by mouth daily.    Marland Kitchen glucose blood (ACCU-CHEK AVIVA PLUS) test strip 1 each by Other route 2 (two) times daily. Use to check blood sugars twice a day Dx E11.9 100 each 3  . losartan-hydrochlorothiazide (HYZAAR) 100-12.5 MG tablet Take 1 tablet by mouth daily. 90 tablet 1  . metFORMIN (GLUCOPHAGE) 1000 MG tablet Take 1 tablet (1,000 mg total) by mouth 2 (two) times daily with a meal. 180 tablet 1  . nebivolol (BYSTOLIC) 10 MG tablet Take 1 tablet (10 mg total) by mouth daily. 90 tablet 1  . simvastatin (ZOCOR) 40 MG tablet TAKE 1 TABLET EVERY DAY 90 tablet 1  . canagliflozin (INVOKANA) 300 MG TABS tablet Take 1 tablet (300 mg total) by mouth daily. 90 tablet 1   No facility-administered medications prior to visit.      ROS Review of Systems  Constitutional: Negative for appetite change, diaphoresis, fatigue and unexpected weight change.  HENT: Negative.   Eyes: Negative.   Respiratory: Negative.  Negative for cough, chest tightness, shortness of breath and wheezing.   Cardiovascular: Positive for leg swelling. Negative for chest pain and palpitations.  Gastrointestinal: Negative for abdominal pain, blood in stool, constipation, diarrhea, nausea and vomiting.  Endocrine: Negative for polydipsia, polyphagia and polyuria.  Genitourinary: Negative.  Negative for difficulty urinating.  Musculoskeletal: Positive for arthralgias. Negative for myalgias and neck pain.  Skin: Negative.  Negative for color change and rash.  Allergic/Immunologic: Negative.   Neurological: Negative.  Negative for dizziness, weakness and light-headedness.  Hematological: Negative for adenopathy. Does not bruise/bleed easily.  Psychiatric/Behavioral: Negative.     Objective:  BP 140/88 (BP Location: Left Arm, Patient Position: Sitting, Cuff Size: Large)   Pulse 60   Temp 98.2 F (36.8 C) (Oral)   Ht 5' 9.5" (1.765 m)   Wt (!) 318 lb (144.2 kg)   SpO2 99%   BMI 46.29 kg/m   BP Readings from Last 3 Encounters:  12/25/17 140/88  08/31/17 126/88  08/14/17 (!) 142/84    Wt Readings from Last 3 Encounters:  12/25/17 (!) 318 lb (144.2 kg)  08/31/17 (!) 308 lb 12.8 oz (140.1 kg)  08/14/17 (!) 308 lb (139.7 kg)  Physical Exam  Constitutional: No distress.  HENT:  Mouth/Throat: Oropharynx is clear and moist. No oropharyngeal exudate.  Eyes: Left eye exhibits no discharge. No scleral icterus.  Neck: Normal range of motion. Neck supple. No JVD present. No thyromegaly present.  Cardiovascular: Normal rate, regular rhythm and normal heart sounds. Exam reveals no gallop.  No murmur heard. Pulmonary/Chest: Effort normal and breath sounds normal. No respiratory distress. She has no wheezes. She has no rales.  Abdominal:  Soft. Bowel sounds are normal. She exhibits no distension and no mass. There is no tenderness.  Musculoskeletal:  Right foot has 2+ non-pitting edema on the dorsum.  Capillary refill is diffusely intact.  Pulses are barely palpable.  There are no ischemic ischemic changes.  Left foot has trace nonpitting edema on the dorsum of the foot.  Capillary refill is intact.  Pulses are barely palpable.  Lymphadenopathy:    She has no cervical adenopathy.  Skin: She is not diaphoretic.  Vitals reviewed.   Lab Results  Component Value Date   WBC 5.4 12/25/2017   HGB 13.6 12/25/2017   HCT 42.0 12/25/2017   PLT 230 12/25/2017   GLUCOSE 163 (H) 12/25/2017   GLUCOSE 163 (H) 12/25/2017   CHOL 209 (H) 12/25/2017   TRIG 136.0 12/25/2017   HDL 65.50 12/25/2017   LDLDIRECT 134.8 08/27/2010   LDLCALC 117 (H) 12/25/2017   ALT 13 12/25/2017   AST 14 12/25/2017   NA 141 12/25/2017   NA 141 12/25/2017   K 3.6 12/25/2017   K 3.6 12/25/2017   CL 102 12/25/2017   CL 102 12/25/2017   CREATININE 0.96 12/25/2017   CREATININE 0.96 12/25/2017   BUN 18 12/25/2017   BUN 18 12/25/2017   CO2 29 12/25/2017   CO2 29 12/25/2017   TSH 2.68 12/25/2017   HGBA1C 7.4 (H) 12/25/2017   MICROALBUR 100.5 (H) 12/26/2017    Dg Chest 2 View  Result Date: 10/25/2016 CLINICAL DATA:  Two days of cough and sinus congestion. Onset of headache today. History of diabetes, hypertension, morbid obesity. EXAM: CHEST  2 VIEW COMPARISON:  Report of a chest x-ray of December 17, 1998 FINDINGS: The lungs are adequately inflated and clear. The heart is normal in size. There is tortuosity of the ascending and descending thoracic aorta. There is no pleural effusion. The mediastinum is not abnormally widened. There is mild multilevel degenerative disc disease of the thoracic spine. IMPRESSION: There is no pneumonia, CHF, nor other acute cardiopulmonary abnormality. Under coiling of the thoracic aorta is consistent with atherosclerotic  changes as well as chronic hypertension. Electronically Signed   By: David  Martinique M.D.   On: 10/25/2016 08:54     Assessment & Plan:   Holly [name] was seen today for hypertension, hyperlipidemia and diabetes.  Diagnoses and all orders for this visit:  Type 2 diabetes mellitus with complication, without long-term current use of insulin ([place])- Her A1c is at 7.4%.  Her blood sugars are adequately well controlled. -     Comprehensive metabolic panel; Future -     Microalbumin / creatinine urine ratio; Future -     Urinalysis, Routine w reflex microscopic; Future -     Hemoglobin A1c; Future -     canagliflozin (INVOKANA) 300 MG TABS tablet; Take 1 tablet (300 mg total) by mouth daily.  Essential hypertension- Her blood pressure is well controlled.  Electrolytes and renal function are normal. -     Cancel: CBC with Differential/Platelet; Future -  Comprehensive metabolic panel; Future -     Thyroid Panel With TSH; Future -     Urinalysis, Routine w reflex microscopic; Future -     CBC with Differential/Platelet; Future  Hyperlipidemia with target LDL less than 100- She has achieved her LDL goal and is doing well on the statin. -     Lipid panel; Future -     Thyroid Panel With TSH; Future  Atherosclerosis of native artery of right lower extremity with intermittent claudication ([place])- I do not think her current symptoms are related to arterial flow. -     Lipid panel; Future  Morbid obesity ([place])- She is working on her lifestyle modifications to lose weight.  Pain and swelling of right lower leg- Her d-dimer and BNP are negative which is reassuring that she does not have a DVT or fluid overload.  I think the edema is related to the obesity. -     Cancel: Brain natriuretic peptide; Future -     D-dimer, quantitative (not at [facility]); Future -     Brain natriuretic peptide; Future   I am having [name]. Elpidio Eric maintain her aspirin, Vitamin D, cetirizine, glucose blood, ACCU-CHEK AVIVA  PLUS, ACCU-CHEK SOFTCLIX LANCETS, simvastatin, Cyanocobalamin (VITAMIN B-12 CR PO), metFORMIN, nebivolol, losartan-hydrochlorothiazide, and canagliflozin.  Meds ordered this encounter  Medications  . canagliflozin (INVOKANA) 300 MG TABS tablet    Sig: Take 1 tablet (300 mg total) by mouth daily.    Dispense:  90 tablet    Refill:  1     Follow-up: Return in about 3 months (around 03/24/2018).  Scarlette Calico, MD

## 2017-12-25 NOTE — Patient Instructions (Signed)

## 2017-12-26 ENCOUNTER — Other Ambulatory Visit: Payer: Self-pay | Admitting: Physical Medicine and Rehabilitation

## 2017-12-26 ENCOUNTER — Ambulatory Visit
Admission: RE | Admit: 2017-12-26 | Discharge: 2017-12-26 | Disposition: A | Discharge status: Home / Self Care | Payer: Medicare PPO | Source: Ambulatory Visit | Attending: Physical Medicine and Rehabilitation | Admitting: Physical Medicine and Rehabilitation

## 2017-12-26 ENCOUNTER — Other Ambulatory Visit (INDEPENDENT_AMBULATORY_CARE_PROVIDER_SITE_OTHER): Payer: Medicare PPO

## 2017-12-26 DIAGNOSIS — M7989 Other specified soft tissue disorders: Secondary | ICD-10-CM | POA: Diagnosis not present

## 2017-12-26 DIAGNOSIS — M25561 Pain in right knee: Secondary | ICD-10-CM | POA: Diagnosis not present

## 2017-12-26 DIAGNOSIS — E118 Type 2 diabetes mellitus with unspecified complications: Secondary | ICD-10-CM | POA: Diagnosis not present

## 2017-12-26 DIAGNOSIS — M79604 Pain in right leg: Secondary | ICD-10-CM

## 2017-12-26 DIAGNOSIS — I10 Essential (primary) hypertension: Secondary | ICD-10-CM | POA: Diagnosis not present

## 2017-12-26 LAB — MICROALBUMIN / CREATININE URINE RATIO
Creatinine,U: 93.1 mg/dL
MICROALB UR: 100.5 mg/dL — AB (ref 0.0–1.9)
Microalb Creat Ratio: 107.9 mg/g — ABNORMAL HIGH (ref 0.0–30.0)

## 2017-12-26 LAB — CBC WITH DIFFERENTIAL/PLATELET
BASOS PCT: 0.6 %
Basophils Absolute: 32 cells/uL (ref 0–200)
EOS ABS: 103 {cells}/uL (ref 15–500)
Eosinophils Relative: 1.9 %
HEMATOCRIT: 42 % (ref 35.0–45.0)
HEMOGLOBIN: 13.6 g/dL (ref 11.7–15.5)
LYMPHS ABS: 1598 {cells}/uL (ref 850–3900)
MCH: 27.2 pg (ref 27.0–33.0)
MCHC: 32.4 g/dL (ref 32.0–36.0)
MCV: 84 fL (ref 80.0–100.0)
MPV: 12 fL (ref 7.5–12.5)
Monocytes Relative: 9.6 %
NEUTROS ABS: 3148 {cells}/uL (ref 1500–7800)
Neutrophils Relative %: 58.3 %
PLATELETS: 230 10*3/uL (ref 140–400)
RBC: 5 10*6/uL (ref 3.80–5.10)
RDW: 13 % (ref 11.0–15.0)
TOTAL LYMPHOCYTE: 29.6 %
WBC: 5.4 10*3/uL (ref 3.8–10.8)
WBCMIX: 518 {cells}/uL (ref 200–950)

## 2017-12-26 LAB — URINALYSIS, ROUTINE W REFLEX MICROSCOPIC
Bilirubin Urine: NEGATIVE
KETONES UR: NEGATIVE
LEUKOCYTES UA: NEGATIVE
Nitrite: NEGATIVE
Specific Gravity, Urine: 1.025 (ref 1.000–1.030)
Total Protein, Urine: >=300 — AB
UROBILINOGEN UA: 0.2 (ref 0.0–1.0)
pH: 7.5 (ref 5.0–8.0)

## 2017-12-26 LAB — THYROID PANEL WITH TSH
Free Thyroxine Index: 2.5 (ref 1.4–3.8)
T3 UPTAKE: 28 % (ref 22–35)
T4 TOTAL: 8.8 ug/dL (ref 5.1–11.9)
TSH: 2.68 mIU/L (ref 0.40–4.50)

## 2017-12-26 LAB — D-DIMER, QUANTITATIVE: D-Dimer, Quant: 0.67 mcg/mL FEU — ABNORMAL HIGH (ref <0.50)

## 2017-12-26 LAB — BRAIN NATRIURETIC PEPTIDE: Brain Natriuretic Peptide: 32 pg/mL (ref <100)

## 2017-12-28 ENCOUNTER — Encounter: Payer: Self-pay | Admitting: Internal Medicine

## 2017-12-28 NOTE — Telephone Encounter (Signed)
PCP sent in losartan to take the place of the telmisartan that was denied.

## 2017-12-28 NOTE — Addendum Note (Signed)
Addended by: Marcina Millard on: 12/28/2017 02:15 PM   Modules accepted: Orders

## 2017-12-28 NOTE — Progress Notes (Signed)
In Labs Tab

## 2017-12-28 NOTE — Telephone Encounter (Signed)
PA denied. PCP sent in losartan to take the place of the telmisartan that was denied.

## 2018-01-01 DIAGNOSIS — M170 Bilateral primary osteoarthritis of knee: Secondary | ICD-10-CM | POA: Diagnosis not present

## 2018-01-01 DIAGNOSIS — M7989 Other specified soft tissue disorders: Secondary | ICD-10-CM | POA: Diagnosis not present

## 2018-01-04 ENCOUNTER — Other Ambulatory Visit: Payer: Self-pay | Admitting: Internal Medicine

## 2018-01-05 ENCOUNTER — Encounter: Payer: Self-pay | Admitting: Internal Medicine

## 2018-01-05 ENCOUNTER — Telehealth: Payer: Self-pay | Admitting: Internal Medicine

## 2018-01-05 NOTE — Telephone Encounter (Signed)
Called [facility] and they are faxing the result (Negative).   [facility] made aware that pt does not have any results in their site.   Results abstracted.

## 2018-01-05 NOTE — Telephone Encounter (Signed)
Copied from [place] [phone]. Topic: Quick Communication - See Telephone Encounter >> Jan 05, 2018 11:36 AM Synthia Innocent wrote: CRM for notification. See Telephone encounter for: patient is requesting to know why she needs another cologuard, had one in Oct 2018. Please advise  01/05/18.

## 2018-01-17 ENCOUNTER — Other Ambulatory Visit: Payer: Self-pay | Admitting: Internal Medicine

## 2018-01-17 ENCOUNTER — Telehealth: Payer: Self-pay

## 2018-01-17 DIAGNOSIS — G4733 Obstructive sleep apnea (adult) (pediatric): Secondary | ICD-10-CM | POA: Diagnosis not present

## 2018-01-17 DIAGNOSIS — I10 Essential (primary) hypertension: Secondary | ICD-10-CM

## 2018-01-17 MED ORDER — CARVEDILOL 6.25 MG PO TABS: 6.2500 mg | ORAL_TABLET | Freq: Two times a day (BID) | ORAL | 1 refills | Status: DC

## 2018-01-17 NOTE — Telephone Encounter (Signed)
[employer] sent a request to change the bystolic to atenolol or metoprolol succinate ER to save the patient money.   Please advise.

## 2018-01-19 NOTE — Telephone Encounter (Signed)
Pt contacted and informed of rx change to carvedilol. Pt stated that she had already been called.

## 2018-01-19 NOTE — Telephone Encounter (Signed)
Copied from [place] [phone]. Topic: Quick Communication - Rx Refill/Question >> Jan 19, 2018 12:23 PM Neva Seat wrote:   [place], [address] [place] [place] [address] Phone: [phone] Fax: [phone]

## 2018-01-19 NOTE — Progress Notes (Signed)
Spoke with patient to inform her Coreg was sent in to replace the [place].

## 2018-01-23 ENCOUNTER — Encounter: Payer: Self-pay | Admitting: Internal Medicine

## 2018-01-23 NOTE — Progress Notes (Signed)
Result abstracted and sent to scan ° °

## 2018-01-30 DIAGNOSIS — M1711 Unilateral primary osteoarthritis, right knee: Secondary | ICD-10-CM | POA: Diagnosis not present

## 2018-02-02 DIAGNOSIS — G4733 Obstructive sleep apnea (adult) (pediatric): Secondary | ICD-10-CM | POA: Diagnosis not present

## 2018-02-06 DIAGNOSIS — M1711 Unilateral primary osteoarthritis, right knee: Secondary | ICD-10-CM | POA: Diagnosis not present

## 2018-02-13 DIAGNOSIS — M1711 Unilateral primary osteoarthritis, right knee: Secondary | ICD-10-CM | POA: Diagnosis not present

## 2018-02-17 DIAGNOSIS — G4733 Obstructive sleep apnea (adult) (pediatric): Secondary | ICD-10-CM | POA: Diagnosis not present

## 2018-02-21 ENCOUNTER — Other Ambulatory Visit: Payer: Self-pay | Admitting: Internal Medicine

## 2018-02-21 DIAGNOSIS — E118 Type 2 diabetes mellitus with unspecified complications: Secondary | ICD-10-CM

## 2018-02-27 ENCOUNTER — Ambulatory Visit: Payer: Medicare PPO | Admitting: Pulmonary Disease

## 2018-03-06 DIAGNOSIS — I10 Essential (primary) hypertension: Secondary | ICD-10-CM | POA: Diagnosis not present

## 2018-03-06 DIAGNOSIS — E785 Hyperlipidemia, unspecified: Secondary | ICD-10-CM | POA: Diagnosis not present

## 2018-03-06 DIAGNOSIS — Z7984 Long term (current) use of oral hypoglycemic drugs: Secondary | ICD-10-CM | POA: Diagnosis not present

## 2018-03-06 DIAGNOSIS — M170 Bilateral primary osteoarthritis of knee: Secondary | ICD-10-CM | POA: Diagnosis not present

## 2018-03-06 DIAGNOSIS — E119 Type 2 diabetes mellitus without complications: Secondary | ICD-10-CM | POA: Diagnosis not present

## 2018-03-06 DIAGNOSIS — Z6841 Body Mass Index (BMI) 40.0 and over, adult: Secondary | ICD-10-CM | POA: Diagnosis not present

## 2018-03-13 ENCOUNTER — Encounter: Payer: Self-pay | Admitting: Family Medicine

## 2018-03-13 ENCOUNTER — Ambulatory Visit: Payer: Medicare PPO | Admitting: Family Medicine

## 2018-03-13 DIAGNOSIS — L723 Sebaceous cyst: Secondary | ICD-10-CM | POA: Diagnosis not present

## 2018-03-13 MED ORDER — DOXYCYCLINE HYCLATE 100 MG PO TABS: 100.0000 mg | ORAL_TABLET | Freq: Two times a day (BID) | ORAL | 0 refills | Status: DC

## 2018-03-13 NOTE — Assessment & Plan Note (Signed)
Appears to be a cyst. Likely has a mild overlying infection  - drainage of cyst today  - doxycycline  - given indications to follow up.

## 2018-03-13 NOTE — Patient Instructions (Addendum)
Please try to keep the area clean  Please take the antibiotic  Please follow up with me if your symptoms don't improve.

## 2018-03-13 NOTE — Progress Notes (Signed)
Holly [name] - 77 y.o. female MRN [id number]  Date of birth: 08-31-1941  SUBJECTIVE:  Including CC & ROS.  Chief Complaint  Patient presents with  . Recurrent Skin Infections    Holly [name] is a 77 y.o. female that is presenting with a boil. She noticed the boil one week ago. She has no history of boils. Denies fevers. Her daughter popped and it drained. Denies tenderness. Admits to itching. She states the area has improved. She has never had something like this before. No changes in size. Still having pain with it.    Review of Systems  Constitutional: Negative for fever.  HENT: Negative for congestion.   Respiratory: Negative for cough.   Cardiovascular: Negative for chest pain.  Gastrointestinal: Negative for abdominal pain.  Musculoskeletal: Negative for back pain.  Skin: Positive for color change.  Neurological: Negative for weakness.  Hematological: Negative for adenopathy.  Psychiatric/Behavioral: Negative for agitation.    HISTORY: Past Medical, Surgical, Social, and Family History Reviewed & Updated per EMR.   Pertinent Historical Findings include:  Past Medical History:  Diagnosis Date  . Closed dislocation of shoulder    unspecified site  . DJD (degenerative joint disease) of knee    Right knee  . DM (diabetes mellitus) ([place])   . Hyperlipidemia   . Hypertension   . Obesity, morbid ([place])   . OSA (obstructive sleep apnea) 02/03/2017    Past Surgical History:  Procedure Laterality Date  . CHOLECYSTECTOMY  1989  . KNEE ARTHROSCOPY  2010   LEFT/ Dr Sharol Given  . TUBAL LIGATION  1979    Allergies  Allergen Reactions  . Amlodipine Other (See Comments)    edema  . Oxycodone     nausea  . Penicillins Hives and Other (See Comments)    fever    Family History  Problem Relation Age of Onset  . Heart disease Mother   . Kidney disease Father   . Aortic aneurysm Brother        Survived rupture     Social History   Socioeconomic History  . Marital  status: Divorced    Spouse name: Not on file  . Number of children: 2  . Years of education: Not on file  . Highest education level: Not on file  Occupational History  . Occupation: Assembly Engineer, manufacturing   . Occupation: Conservation officer, historic buildings: [employer]    Comment: [employer]  . Financial resource strain: Not on file  . Food insecurity:    Worry: Not on file    Inability: Not on file  . Transportation needs:    Medical: Not on file    Non-medical: Not on file  Tobacco Use  . Smoking status: Former Smoker    Packs/day: 0.50    Years: 25.00    Pack years: 12.50    Types: Cigarettes  . Smokeless tobacco: Never Used  Substance and Sexual Activity  . Alcohol use: No    Alcohol/week: 0.0 oz  . Drug use: No  . Sexual activity: Not Currently  Lifestyle  . Physical activity:    Days per week: Not on file    Minutes per session: Not on file  . Stress: Not on file  Relationships  . Social connections:    Talks on phone: Not on file    Gets together: Not on file    Attends religious service: Not on file    Active member  of club or organization: Not on file    Attends meetings of clubs or organizations: Not on file    Relationship status: Not on file  . Intimate partner violence:    Fear of current or ex partner: Not on file    Emotionally abused: Not on file    Physically abused: Not on file    Forced sexual activity: Not on file  Other Topics Concern  . Not on file  Social History Narrative   RETIRED   Married 29 yrs, currently divorced   Daughter born '65, Son born '69; 2 grandchildren - 1 here, 1 in [place] 40 min x 3 days/wk     PHYSICAL EXAM:  VS: BP (!) 148/86 (BP Location: Left Arm, Patient Position: Sitting, Cuff Size: Large)   Pulse 69   Temp 98.4 F (36.9 C) (Oral)   Ht 5' 9.5" (1.765 m)   Wt (!) 319 lb (144.7 kg)   SpO2 98%   BMI 46.43 kg/m  Physical Exam Gen: NAD, alert, cooperative with exam,  well-appearing ENT: normal lips, normal nasal mucosa,  Eye: normal EOM, normal conjunctiva and lids CV:  no edema, +2 pedal pulses   Resp: no accessory muscle use, non-labored,  Skin: Area on the left upper trapezius with mild fluctuance and redness.  No streaking. Neuro: normal tone, normal sensation to touch Psych:  normal insight, alert and oriented MSK: Normal gait, normal strength  Limited ultrasound: Left upper back:  Hypoechoic change underneath the area of tenderness.  Summary: Cystic formation  Ultrasound and interpretation by Clearance Coots, MD      Incision and Drainage Procedure Note:  The affected area was cleaned and draped in a sterile fashion. Anesthesia was achieved using 5 mL of 1% Lidocaine without epinephrine injected around the wound area using a 25-guage 1.5 inch needle.  An 18-gauge needle was used to insert into the cyst and aspirate.  The material was expressed from the cyst.  A sterile dressing was applied to the area.The patient tolerated the procedure well. No complications were encountered.     ASSESSMENT & PLAN:   Sebaceous cyst Appears to be a cyst. Likely has a mild overlying infection  - drainage of cyst today  - doxycycline  - given indications to follow up.

## 2018-04-04 ENCOUNTER — Other Ambulatory Visit (INDEPENDENT_AMBULATORY_CARE_PROVIDER_SITE_OTHER): Payer: Medicare PPO

## 2018-04-04 ENCOUNTER — Encounter: Payer: Self-pay | Admitting: Internal Medicine

## 2018-04-04 ENCOUNTER — Ambulatory Visit: Payer: Medicare PPO | Admitting: Internal Medicine

## 2018-04-04 VITALS — BP 170/100 | HR 77 | Resp 16 | Ht 69.5 in | Wt 318.0 lb

## 2018-04-04 DIAGNOSIS — E118 Type 2 diabetes mellitus with unspecified complications: Secondary | ICD-10-CM

## 2018-04-04 DIAGNOSIS — E559 Vitamin D deficiency, unspecified: Secondary | ICD-10-CM | POA: Diagnosis not present

## 2018-04-04 DIAGNOSIS — N904 Leukoplakia of vulva: Secondary | ICD-10-CM | POA: Diagnosis not present

## 2018-04-04 DIAGNOSIS — N760 Acute vaginitis: Secondary | ICD-10-CM

## 2018-04-04 DIAGNOSIS — I10 Essential (primary) hypertension: Secondary | ICD-10-CM

## 2018-04-04 LAB — VITAMIN D 25 HYDROXY (VIT D DEFICIENCY, FRACTURES): VITD: 30.66 ng/mL (ref 30.00–100.00)

## 2018-04-04 LAB — HEMOGLOBIN A1C: HEMOGLOBIN A1C: 7.3 % — AB (ref 4.6–6.5)

## 2018-04-04 LAB — BASIC METABOLIC PANEL
BUN: 15 mg/dL (ref 6–23)
CALCIUM: 9.5 mg/dL (ref 8.4–10.5)
CO2: 27 mEq/L (ref 19–32)
CREATININE: 1.02 mg/dL (ref 0.40–1.20)
Chloride: 105 mEq/L (ref 96–112)
GFR: 67.96 mL/min (ref >60.00)
GLUCOSE: 159 mg/dL — AB (ref 70–99)
Potassium: 3.9 mEq/L (ref 3.5–5.1)
Sodium: 140 mEq/L (ref 135–145)

## 2018-04-04 MED ORDER — AZILSARTAN-CHLORTHALIDONE 40-12.5 MG PO TABS: 1.0000 | ORAL_TABLET | Freq: Every day | ORAL | 0 refills | Status: DC

## 2018-04-04 MED ORDER — FLUOCINONIDE-E 0.05 % EX CREA: 1.0000 "application " | TOPICAL_CREAM | Freq: Two times a day (BID) | CUTANEOUS | 1 refills | Status: DC

## 2018-04-04 MED ORDER — CHOLECALCIFEROL 50 MCG (2000 UT) PO TABS: 1.0000 | ORAL_TABLET | Freq: Every day | ORAL | 1 refills | Status: AC

## 2018-04-04 NOTE — Progress Notes (Signed)
Subjective:  Patient ID: Holly [name], female    DOB: 09-Jan-1941  Age: 77 y.o. MRN: [id number]  CC: Hypertension and Diabetes   HPI ZAYRA DEVITO presents for f/up - She complains that her blood pressure is not well controlled.  She tells me she is compliant with all of her current antihypertensives.  She has mild, chronic lower extremity edema worse on the left than the right.  She denies any recent episodes of headache, blurred vision, chest pain, or shortness of breath.  She is being treated for sleep apnea.  Outpatient Medications Prior to Visit  Medication Sig Dispense Refill  . ACCU-CHEK SOFTCLIX LANCETS lancets 1 each by Other route 2 (two) times daily. Use to check blood sugars twice a day Dx e11.9 100 each 3  . aspirin 81 MG EC tablet Take 81 mg by mouth daily.      . Blood Glucose Monitoring Suppl (ACCU-CHEK AVIVA PLUS) w/Device KIT Use to check blood sugars daily Dx E11.9 1 kit 0  . canagliflozin (INVOKANA) 300 MG TABS tablet Take 1 tablet (300 mg total) by mouth daily. 90 tablet 1  . carvedilol (COREG) 6.25 MG tablet Take 1 tablet (6.25 mg total) by mouth 2 (two) times daily with a meal. 180 tablet 1  . cetirizine (ZYRTEC ALLERGY) 10 MG tablet Take 10 mg by mouth daily.      . Cyanocobalamin (VITAMIN B-12 CR PO) Take 1 tablet by mouth daily.    Marland Kitchen glucose blood (ACCU-CHEK AVIVA PLUS) test strip 1 each by Other route 2 (two) times daily. Use to check blood sugars twice a day Dx E11.9 100 each 3  . INVOKANA 300 MG TABS tablet TAKE 1 TABLET BY MOUTH ONCE DAILY 90 tablet 1  . metFORMIN (GLUCOPHAGE) 1000 MG tablet Take 1 tablet (1,000 mg total) by mouth 2 (two) times daily with a meal. 180 tablet 1  . simvastatin (ZOCOR) 40 MG tablet TAKE 1 TABLET EVERY DAY 90 tablet 1  . Cholecalciferol (VITAMIN D) 400 UNIT capsule Take 400 Units by mouth daily.      Marland Kitchen doxycycline (VIBRA-TABS) 100 MG tablet Take 1 tablet (100 mg total) by mouth 2 (two) times daily. 20 tablet 0  .  losartan-hydrochlorothiazide (HYZAAR) 100-12.5 MG tablet Take 1 tablet by mouth daily. 90 tablet 1   No facility-administered medications prior to visit.     ROS Review of Systems  Constitutional: Negative for diaphoresis, fatigue and unexpected weight change.  HENT: Negative.   Eyes: Negative for pain and visual disturbance.  Respiratory: Positive for apnea. Negative for shortness of breath and wheezing.   Cardiovascular: Positive for leg swelling. Negative for chest pain and palpitations.  Gastrointestinal: Negative.  Negative for abdominal pain, constipation, diarrhea, nausea and vomiting.  Endocrine: Negative.  Negative for polydipsia, polyphagia and polyuria.  Genitourinary: Negative.  Negative for decreased urine volume, difficulty urinating, dysuria, frequency and urgency.       + vaginal rash and itching  Musculoskeletal: Negative for arthralgias and myalgias.  Skin: Negative.   Neurological: Negative.  Negative for dizziness, weakness and light-headedness.  Hematological: Negative for adenopathy. Does not bruise/bleed easily.  Psychiatric/Behavioral: Negative.     Objective:  BP (!) 170/100 (BP Location: Left Arm, Patient Position: Sitting, Cuff Size: Large)   Pulse 77   Resp 16   Ht 5' 9.5" (1.765 m)   Wt (!) 318 lb (144.2 kg)   SpO2 97%   BMI 46.29 kg/m   BP Readings from Last  3 Encounters:  04/04/18 (!) 170/100  03/13/18 (!) 148/86  12/25/17 140/88    Wt Readings from Last 3 Encounters:  04/04/18 (!) 318 lb (144.2 kg)  03/13/18 (!) 319 lb (144.7 kg)  12/25/17 (!) 318 lb (144.2 kg)    Physical Exam  Constitutional: She is oriented to person, place, and time. No distress.  HENT:  Mouth/Throat: Oropharynx is clear and moist. No oropharyngeal exudate.  Eyes: Conjunctivae are normal. No scleral icterus.  Neck: Normal range of motion. Neck supple. No JVD present. No thyromegaly present.  Cardiovascular: Normal rate, regular rhythm and normal heart sounds. Exam  reveals no gallop.  No murmur heard. Pulmonary/Chest: Effort normal and breath sounds normal. No respiratory distress. She has no wheezes. She has no rales.  Abdominal: Soft. Bowel sounds are normal. She exhibits no mass. There is no hepatosplenomegaly. There is no tenderness.  Musculoskeletal: Normal range of motion. She exhibits edema (trace pitting edema BLE). She exhibits no tenderness or deformity.  Neurological: She is alert and oriented to person, place, and time.  Skin: Skin is warm and dry. She is not diaphoretic.  Vitals reviewed.   Lab Results  Component Value Date   WBC 5.4 12/25/2017   HGB 13.6 12/25/2017   HCT 42.0 12/25/2017   PLT 230 12/25/2017   GLUCOSE 159 (H) 04/04/2018   CHOL 209 (H) 12/25/2017   TRIG 136.0 12/25/2017   HDL 65.50 12/25/2017   LDLDIRECT 134.8 08/27/2010   LDLCALC 117 (H) 12/25/2017   ALT 13 12/25/2017   AST 14 12/25/2017   NA 140 04/04/2018   K 3.9 04/04/2018   CL 105 04/04/2018   CREATININE 1.02 04/04/2018   BUN 15 04/04/2018   CO2 27 04/04/2018   TSH 2.68 12/25/2017   HGBA1C 7.3 (H) 04/04/2018   MICROALBUR 100.5 (H) 12/26/2017    US Venous Img Lower Unilateral Right  Result Date: 12/26/2017 CLINICAL DATA:  77 year old female with a history of right knee pain and swelling EXAM: RIGHT LOWER EXTREMITY VENOUS DOPPLER ULTRASOUND TECHNIQUE: Gray-scale sonography with graded compression, as well as color Doppler and duplex ultrasound were performed to evaluate the lower extremity deep venous systems from the level of the common femoral vein and including the common femoral, femoral, profunda femoral, popliteal and calf veins including the posterior tibial, peroneal and gastrocnemius veins when visible. The superficial great saphenous vein was also interrogated. Spectral Doppler was utilized to evaluate flow at rest and with distal augmentation maneuvers in the common femoral, femoral and popliteal veins. COMPARISON:  None. FINDINGS: Contralateral  Common Femoral Vein: Respiratory phasicity is normal and symmetric with the symptomatic side. No evidence of thrombus. Normal compressibility. Common Femoral Vein: No evidence of thrombus. Normal compressibility, respiratory phasicity and response to augmentation. Saphenofemoral Junction: No evidence of thrombus. Normal compressibility and flow on color Doppler imaging. Profunda Femoral Vein: No evidence of thrombus. Normal compressibility and flow on color Doppler imaging. Femoral Vein: No evidence of thrombus. Normal compressibility, respiratory phasicity and response to augmentation. Popliteal Vein: No evidence of thrombus. Normal compressibility, respiratory phasicity and response to augmentation. Calf Veins: No evidence of thrombus. Normal compressibility and flow on color Doppler imaging. Superficial Great Saphenous Vein: No evidence of thrombus. Normal compressibility and flow on color Doppler imaging. Other Findings: Lentiform fluid collection along the medial right knee, nonspecific. IMPRESSION: Sonographic survey of the right lower extremity negative for DVT. Lentiform fluid collection at the medial right knee, nonspecific though potentially Baker's cyst. Electronically Signed   By: York Cerise  Earleen Newport D.O.   On: 12/26/2017 14:55    Assessment & Plan:   Gabryela was seen today for hypertension and diabetes.  Diagnoses and all orders for this visit:  Essential hypertension - Her blood pressure is not adequately well controlled.  I will treat the vitamin D deficiency.  I will upgrade her to a more potent ARB and thiazide diuretic.  I will screen her for primary aldosteronism. -     Basic metabolic panel; Future -     VITAMIN D 25 Hydroxy (Vit-D Deficiency, Fractures); Future -     Aldosterone + renin activity w/ ratio; Future -     Azilsartan-Chlorthalidone (EDARBYCLOR) 40-12.5 MG TABS; Take 1 tablet by mouth daily.  Type 2 diabetes mellitus with complication, without long-term current use of insulin  ([place])- Her A1c is at 7.3%.  Her blood sugars are adequately well controlled.  She was encouraged to improve her lifestyle modifications. -     Basic metabolic panel; Future -     Hemoglobin A1c; Future -     Azilsartan-Chlorthalidone (EDARBYCLOR) 40-12.5 MG TABS; Take 1 tablet by mouth daily.  Lichen sclerosus et atrophicus of the vulva -     Discontinue: fluocinonide-emollient (LIDEX-E) 0.05 % cream; Apply 1 application topically 2 (two) times daily.  Vaginitis and vulvovaginitis -     Discontinue: fluocinonide-emollient (LIDEX-E) 0.05 % cream; Apply 1 application topically 2 (two) times daily.  Vitamin D deficiency -     Cholecalciferol 2000 units TABS; Take 1 tablet (2,000 Units total) by mouth daily.   I have discontinued [name]. [place]iller's Vitamin D, losartan-hydrochlorothiazide, doxycycline, and fluocinonide-emollient. I am also having her start on Azilsartan-Chlorthalidone and Cholecalciferol. Additionally, I am having her maintain her aspirin, cetirizine, glucose blood, ACCU-CHEK AVIVA PLUS, ACCU-CHEK SOFTCLIX LANCETS, Cyanocobalamin (VITAMIN B-12 CR PO), metFORMIN, canagliflozin, simvastatin, carvedilol, and INVOKANA.  Meds ordered this encounter  Medications  . Azilsartan-Chlorthalidone (EDARBYCLOR) 40-12.5 MG TABS    Sig: Take 1 tablet by mouth daily.    Dispense:  42 tablet    Refill:  0  . DISCONTD: fluocinonide-emollient (LIDEX-E) 0.05 % cream    Sig: Apply 1 application topically 2 (two) times daily.    Dispense:  60 g    Refill:  1  . Cholecalciferol 2000 units TABS    Sig: Take 1 tablet (2,000 Units total) by mouth daily.    Dispense:  90 tablet    Refill:  1     Follow-up: Return in about 6 weeks (around 05/16/2018).  Scarlette Calico, MD

## 2018-04-04 NOTE — Patient Instructions (Signed)

## 2018-04-08 LAB — ALDOSTERONE + RENIN ACTIVITY W/ RATIO
ALDO / PRA RATIO: 7.7 ratio (ref 0.9–28.9)
ALDOSTERONE: 6 ng/dL
RENIN ACTIVITY: 0.78 ng/mL/h (ref 0.25–5.82)

## 2018-05-16 ENCOUNTER — Ambulatory Visit: Payer: Medicare PPO | Admitting: Internal Medicine

## 2018-05-16 ENCOUNTER — Encounter: Payer: Self-pay | Admitting: Internal Medicine

## 2018-05-16 VITALS — BP 122/78 | HR 78 | Temp 98.4°F | Resp 16 | Ht 69.5 in | Wt 311.0 lb

## 2018-05-16 DIAGNOSIS — I10 Essential (primary) hypertension: Secondary | ICD-10-CM

## 2018-05-16 DIAGNOSIS — E118 Type 2 diabetes mellitus with unspecified complications: Secondary | ICD-10-CM

## 2018-05-16 LAB — HM DIABETES FOOT EXAM

## 2018-05-16 MED ORDER — AZILSARTAN-CHLORTHALIDONE 40-12.5 MG PO TABS: 1.0000 | ORAL_TABLET | Freq: Every day | ORAL | 1 refills | Status: DC

## 2018-05-16 NOTE — Progress Notes (Signed)
Subjective:  Patient ID: Holly [name], female    DOB: 1941/01/20  Age: 77 y.o. MRN: [id number]  CC: Hypertension   HPI Holly [name] presents for f/up - she tells me her blood pressure has been well controlled.  She feels well and offers no complaints.  Outpatient Medications Prior to Visit  Medication Sig Dispense Refill  . ACCU-CHEK SOFTCLIX LANCETS lancets 1 each by Other route 2 (two) times daily. Use to check blood sugars twice a day Dx e11.9 100 each 3  . aspirin 81 MG EC tablet Take 81 mg by mouth daily.      . Blood Glucose Monitoring Suppl (ACCU-CHEK AVIVA PLUS) w/Device KIT Use to check blood sugars daily Dx E11.9 1 kit 0  . canagliflozin (INVOKANA) 300 MG TABS tablet Take 1 tablet (300 mg total) by mouth daily. 90 tablet 1  . carvedilol (COREG) 6.25 MG tablet Take 1 tablet (6.25 mg total) by mouth 2 (two) times daily with a meal. 180 tablet 1  . cetirizine (ZYRTEC ALLERGY) 10 MG tablet Take 10 mg by mouth daily.      . Cholecalciferol 2000 units TABS Take 1 tablet (2,000 Units total) by mouth daily. 90 tablet 1  . Cyanocobalamin (VITAMIN B-12 CR PO) Take 1 tablet by mouth daily.    Marland Kitchen glucose blood (ACCU-CHEK AVIVA PLUS) test strip 1 each by Other route 2 (two) times daily. Use to check blood sugars twice a day Dx E11.9 100 each 3  . INVOKANA 300 MG TABS tablet TAKE 1 TABLET BY MOUTH ONCE DAILY 90 tablet 1  . metFORMIN (GLUCOPHAGE) 1000 MG tablet Take 1 tablet (1,000 mg total) by mouth 2 (two) times daily with a meal. 180 tablet 1  . simvastatin (ZOCOR) 40 MG tablet TAKE 1 TABLET EVERY DAY 90 tablet 1  . Azilsartan-Chlorthalidone (EDARBYCLOR) 40-12.5 MG TABS Take 1 tablet by mouth daily. 42 tablet 0   No facility-administered medications prior to visit.     ROS Review of Systems  Constitutional: Negative.  Negative for chills, diaphoresis, fatigue and fever.  HENT: Negative.   Eyes: Negative for visual disturbance.  Respiratory: Negative.  Negative for cough,  chest tightness, shortness of breath and wheezing.   Cardiovascular: Negative for chest pain, palpitations and leg swelling.  Gastrointestinal: Negative for abdominal pain, constipation, diarrhea, nausea and vomiting.  Endocrine: Negative.   Genitourinary: Negative.  Negative for difficulty urinating.  Musculoskeletal: Negative.  Negative for arthralgias and myalgias.  Skin: Negative.  Negative for color change and pallor.  Neurological: Negative.  Negative for dizziness, weakness and light-headedness.  Hematological: Negative.  Negative for adenopathy. Does not bruise/bleed easily.    Objective:  BP 122/78 (BP Location: Left Arm, Patient Position: Sitting, Cuff Size: Large)   Pulse 78   Temp 98.4 F (36.9 C) (Oral)   Resp 16   Ht 5' 9.5" (1.765 m)   Wt (!) 311 lb (141.1 kg)   SpO2 95%   BMI 45.27 kg/m   BP Readings from Last 3 Encounters:  05/16/18 122/78  04/04/18 (!) 170/100  03/13/18 (!) 148/86    Wt Readings from Last 3 Encounters:  05/16/18 (!) 311 lb (141.1 kg)  04/04/18 (!) 318 lb (144.2 kg)  03/13/18 (!) 319 lb (144.7 kg)    Physical Exam  Constitutional: She is oriented to person, place, and time. No distress.  HENT:  Mouth/Throat: Oropharynx is clear and moist. No oropharyngeal exudate.  Eyes: Conjunctivae are normal. No scleral icterus.  Neck:  Normal range of motion. Neck supple. No JVD present. No thyromegaly present.  Cardiovascular: Normal rate, regular rhythm and normal heart sounds.  No murmur heard. Pulmonary/Chest: Effort normal and breath sounds normal. She has no wheezes. She has no rhonchi. She has no rales.  Abdominal: Soft. Bowel sounds are normal. She exhibits no mass. There is no hepatosplenomegaly. There is no tenderness.  Musculoskeletal: Normal range of motion. She exhibits no edema, tenderness or deformity.  Lymphadenopathy:    She has no cervical adenopathy.  Neurological: She is alert and oriented to person, place, and time.  Skin: Skin  is warm and dry. No rash noted. She is not diaphoretic. No pallor.  Vitals reviewed.   Lab Results  Component Value Date   WBC 5.4 12/25/2017   HGB 13.6 12/25/2017   HCT 42.0 12/25/2017   PLT 230 12/25/2017   GLUCOSE 159 (H) 04/04/2018   CHOL 209 (H) 12/25/2017   TRIG 136.0 12/25/2017   HDL 65.50 12/25/2017   LDLDIRECT 134.8 08/27/2010   LDLCALC 117 (H) 12/25/2017   ALT 13 12/25/2017   AST 14 12/25/2017   NA 140 04/04/2018   K 3.9 04/04/2018   CL 105 04/04/2018   CREATININE 1.02 04/04/2018   BUN 15 04/04/2018   CO2 27 04/04/2018   TSH 2.68 12/25/2017   HGBA1C 7.3 (H) 04/04/2018   MICROALBUR 100.5 (H) 12/26/2017    US Venous Img Lower Unilateral Right  Result Date: 12/26/2017 CLINICAL DATA:  77 year old female with a history of right knee pain and swelling EXAM: RIGHT LOWER EXTREMITY VENOUS DOPPLER ULTRASOUND TECHNIQUE: Gray-scale sonography with graded compression, as well as color Doppler and duplex ultrasound were performed to evaluate the lower extremity deep venous systems from the level of the common femoral vein and including the common femoral, femoral, profunda femoral, popliteal and calf veins including the posterior tibial, peroneal and gastrocnemius veins when visible. The superficial great saphenous vein was also interrogated. Spectral Doppler was utilized to evaluate flow at rest and with distal augmentation maneuvers in the common femoral, femoral and popliteal veins. COMPARISON:  None. FINDINGS: Contralateral Common Femoral Vein: Respiratory phasicity is normal and symmetric with the symptomatic side. No evidence of thrombus. Normal compressibility. Common Femoral Vein: No evidence of thrombus. Normal compressibility, respiratory phasicity and response to augmentation. Saphenofemoral Junction: No evidence of thrombus. Normal compressibility and flow on color Doppler imaging. Profunda Femoral Vein: No evidence of thrombus. Normal compressibility and flow on color Doppler  imaging. Femoral Vein: No evidence of thrombus. Normal compressibility, respiratory phasicity and response to augmentation. Popliteal Vein: No evidence of thrombus. Normal compressibility, respiratory phasicity and response to augmentation. Calf Veins: No evidence of thrombus. Normal compressibility and flow on color Doppler imaging. Superficial Great Saphenous Vein: No evidence of thrombus. Normal compressibility and flow on color Doppler imaging. Other Findings: Lentiform fluid collection along the medial right knee, nonspecific. IMPRESSION: Sonographic survey of the right lower extremity negative for DVT. Lentiform fluid collection at the medial right knee, nonspecific though potentially Baker's cyst. Electronically Signed   By: Corrie Mckusick D.O.   On: 12/26/2017 14:55    Assessment & Plan:   Cristle was seen today for hypertension.  Diagnoses and all orders for this visit:  Essential hypertension- Her blood pressure is well controlled.  I will monitor her electrolytes and renal function. -     Basic metabolic panel; Future -     Azilsartan-Chlorthalidone (EDARBYCLOR) 40-12.5 MG TABS; Take 1 tablet by mouth daily.  Type 2 diabetes  mellitus with complication, without long-term current use of insulin ([place])- her recent A1c was 7.3%.  Her blood sugars are adequately well controlled. -     Azilsartan-Chlorthalidone (EDARBYCLOR) 40-12.5 MG TABS; Take 1 tablet by mouth daily.   I am having [name]. Elpidio Eric maintain her aspirin, cetirizine, glucose blood, ACCU-CHEK AVIVA PLUS, ACCU-CHEK SOFTCLIX LANCETS, Cyanocobalamin (VITAMIN B-12 CR PO), metFORMIN, canagliflozin, simvastatin, carvedilol, INVOKANA, Cholecalciferol, and Azilsartan-Chlorthalidone.  Meds ordered this encounter  Medications  . Azilsartan-Chlorthalidone (EDARBYCLOR) 40-12.5 MG TABS    Sig: Take 1 tablet by mouth daily.    Dispense:  90 tablet    Refill:  1     Follow-up: Return in about 6 months (around 11/16/2018).  Scarlette Calico, MD

## 2018-05-16 NOTE — Patient Instructions (Signed)

## 2018-05-24 ENCOUNTER — Other Ambulatory Visit: Payer: Self-pay

## 2018-05-24 ENCOUNTER — Telehealth: Payer: Self-pay | Admitting: Internal Medicine

## 2018-05-24 DIAGNOSIS — I10 Essential (primary) hypertension: Secondary | ICD-10-CM

## 2018-05-24 DIAGNOSIS — E118 Type 2 diabetes mellitus with unspecified complications: Secondary | ICD-10-CM

## 2018-05-24 MED ORDER — AZILSARTAN-CHLORTHALIDONE 40-12.5 MG PO TABS: 1.0000 | ORAL_TABLET | Freq: Every day | ORAL | 1 refills | Status: DC

## 2018-05-24 NOTE — Telephone Encounter (Signed)
Copied from [place] [id number]. Topic: Quick Communication - See Telephone Encounter >> May 24, 2018  9:18 AM Ivar Drape wrote: CRM for notification. See Telephone encounter for: 05/24/18. Patient stated her prescription for Azilsartan-Chlorthalidone (EDARBYCLOR) 40-12.5 MG TABS was sent to [facility], but they do not accept Medicare.  So she would like to have the prescription sent to [facility] on [address].

## 2018-05-29 ENCOUNTER — Other Ambulatory Visit: Payer: Self-pay | Admitting: Internal Medicine

## 2018-05-29 DIAGNOSIS — E118 Type 2 diabetes mellitus with unspecified complications: Secondary | ICD-10-CM

## 2018-05-29 DIAGNOSIS — I10 Essential (primary) hypertension: Secondary | ICD-10-CM

## 2018-05-29 MED ORDER — OLMESARTAN MEDOXOMIL-HCTZ 40-12.5 MG PO TABS: 1.0000 | ORAL_TABLET | Freq: Every day | ORAL | 1 refills | Status: DC

## 2018-05-29 NOTE — Telephone Encounter (Signed)
Please advise.  Appears this has been waiting on nurse triage.

## 2018-05-29 NOTE — Telephone Encounter (Signed)
Patient wants a generic or something else called in, in place of the azilsartan. She still has not heard back. Patient is requesting a call back from Dr. Ronnald Ramp cma.

## 2018-05-30 ENCOUNTER — Other Ambulatory Visit: Payer: Self-pay | Admitting: Internal Medicine

## 2018-05-30 DIAGNOSIS — E118 Type 2 diabetes mellitus with unspecified complications: Secondary | ICD-10-CM

## 2018-05-30 DIAGNOSIS — I10 Essential (primary) hypertension: Secondary | ICD-10-CM

## 2018-06-14 ENCOUNTER — Other Ambulatory Visit: Payer: Self-pay | Admitting: Internal Medicine

## 2018-06-20 ENCOUNTER — Other Ambulatory Visit: Payer: Self-pay | Admitting: Internal Medicine

## 2018-06-20 DIAGNOSIS — I10 Essential (primary) hypertension: Secondary | ICD-10-CM

## 2018-08-08 ENCOUNTER — Telehealth: Payer: Self-pay | Admitting: Internal Medicine

## 2018-08-08 MED ORDER — ACCU-CHEK SOFTCLIX LANCETS MISC: 1.0000 | Freq: Two times a day (BID) | 1 refills | Status: DC

## 2018-08-08 MED ORDER — GLUCOSE BLOOD VI STRP: 1.0000 | ORAL_STRIP | Freq: Two times a day (BID) | 1 refills | Status: DC

## 2018-08-08 MED ORDER — ACCU-CHEK AVIVA PLUS W/DEVICE KIT: PACK | 0 refills | Status: DC

## 2018-08-08 NOTE — Telephone Encounter (Signed)
Copied from [place] [id number]. Topic: Quick Communication - See Telephone Encounter >> Aug 08, 2018 12:16 PM Ivar Drape wrote: CRM for notification. See Telephone encounter for: 08/08/18. Patient needs a new meter for checking her blood sugar and her insurance company will cover Accu Chek.  Please forward a new prescription for a meter and lancets for a Accu Check meter and send it to her preferred pharmacy [facility] on [address]

## 2018-08-08 NOTE — Telephone Encounter (Signed)
Reviewed chart pt is up-to-date sent rx's to [facility].Marland KitchenJohny Chess

## 2018-08-08 NOTE — Telephone Encounter (Signed)
Patient called and she clarified that she will need a new meter because the one she has is broken. She says she wants the same meter as before, Accu-chek Aviva Plus and she wants it to go to [place].  Accucheck Aviva Meter, strips, lancets refill Last Refill:07/04/16 (expired)  Last OV: 05/16/18 PCP: [place]: [facility] - [place], [place] [phone] (Phone) [phone] (Fax)

## 2018-08-15 ENCOUNTER — Ambulatory Visit: Payer: Medicare PPO

## 2018-08-16 DIAGNOSIS — M48061 Spinal stenosis, lumbar region without neurogenic claudication: Secondary | ICD-10-CM | POA: Diagnosis not present

## 2018-08-17 NOTE — Telephone Encounter (Signed)
[facility] pharmacy called in and started they needed prior authorization for meter  Cb# [id number]

## 2018-08-19 ENCOUNTER — Other Ambulatory Visit: Payer: Self-pay | Admitting: Internal Medicine

## 2018-08-19 DIAGNOSIS — E118 Type 2 diabetes mellitus with unspecified complications: Secondary | ICD-10-CM

## 2018-08-19 MED ORDER — GLUCOSE BLOOD VI STRP: 1.0000 | ORAL_STRIP | Freq: Two times a day (BID) | 1 refills | Status: DC

## 2018-08-20 NOTE — Progress Notes (Deleted)
Subjective:   Holly [name] is a 77 y.o. female who presents for Medicare Annual (Subsequent) preventive examination.  Review of Systems:  No ROS.  Medicare Wellness Visit. Additional risk factors are reflected in the social history.   Sleep patterns: {SX; SLEEP PATTERNS:18802::"feels rested on waking","does not get up to void","gets up *** times nightly to void","sleeps *** hours nightly"}.    Home Safety/Smoke Alarms: Feels safe in home. Smoke alarms in place.  Living environment; residence and Firearm Safety: {Rehab home environment / accessibility:30080::"no firearms","firearms stored safely"}. Seat Belt Safety/Bike Helmet: Wears seat belt.    Objective:     Vitals: There were no vitals taken for this visit.  There is no height or weight on file to calculate BMI.  Advanced Directives 08/14/2017 10/21/2015  Does Patient Have a Medical Advance Directive? No Yes  Copy of [place] in Chart? - Yes  Would patient like information on creating a medical advance directive? Yes (ED - Information included in AVS) -    Tobacco Social History   Tobacco Use  Smoking Status Former Smoker  . Packs/day: 0.50  . Years: 25.00  . Pack years: 12.50  . Types: Cigarettes  Smokeless Tobacco Never Used     Counseling given: Not Answered  Past Medical History:  Diagnosis Date  . Closed dislocation of shoulder    unspecified site  . DJD (degenerative joint disease) of knee    Right knee  . DM (diabetes mellitus) ([place])   . Hyperlipidemia   . Hypertension   . Obesity, morbid ([place])   . OSA (obstructive sleep apnea) 02/03/2017   Past Surgical History:  Procedure Laterality Date  . CHOLECYSTECTOMY  1989  . KNEE ARTHROSCOPY  2010   LEFT/ Dr Sharol Given  . TUBAL LIGATION  1979   Family History  Problem Relation Age of Onset  . Heart disease Mother   . Kidney disease Father   . Aortic aneurysm Brother        Survived rupture   Social History   Socioeconomic History    . Marital status: Divorced    Spouse name: Not on file  . Number of children: 2  . Years of education: Not on file  . Highest education level: Not on file  Occupational History  . Occupation: Assembly Engineer, manufacturing   . Occupation: Conservation officer, historic buildings: [employer]    Comment: RETIRED  Social Needs  . Financial resource strain: Not on file  . Food insecurity:    Worry: Not on file    Inability: Not on file  . Transportation needs:    Medical: Not on file    Non-medical: Not on file  Tobacco Use  . Smoking status: Former Smoker    Packs/day: 0.50    Years: 25.00    Pack years: 12.50    Types: Cigarettes  . Smokeless tobacco: Never Used  Substance and Sexual Activity  . Alcohol use: No    Alcohol/week: 0.0 standard drinks  . Drug use: No  . Sexual activity: Not Currently  Lifestyle  . Physical activity:    Days per week: Not on file    Minutes per session: Not on file  . Stress: Not on file  Relationships  . Social connections:    Talks on phone: Not on file    Gets together: Not on file    Attends religious service: Not on file    Active member of club  or organization: Not on file    Attends meetings of clubs or organizations: Not on file    Relationship status: Not on file  Other Topics Concern  . Not on file  Social History Narrative   RETIRED   Married 23 yrs, currently divorced   Daughter born '65, Son born '69; 2 grandchildren - 1 here, 1 in [place]   Exercises 40 min x 3 days/wk    Outpatient Encounter Medications as of 08/21/2018  Medication Sig  . ACCU-CHEK SOFTCLIX LANCETS lancets 1 each by Other route 2 (two) times daily. Use to check blood sugars twice a day Dx e11.9  . aspirin 81 MG EC tablet Take 81 mg by mouth daily.    . Blood Glucose Monitoring Suppl (ACCU-CHEK AVIVA PLUS) w/Device KIT Use to check blood sugars daily Dx E11.9  . canagliflozin (INVOKANA) 300 MG TABS tablet Take 1 tablet (300 mg total) by mouth daily.  .  carvedilol (COREG) 6.25 MG tablet TAKE 1 TABLET TWICE DAILY WITH MEALS  . cetirizine (ZYRTEC ALLERGY) 10 MG tablet Take 10 mg by mouth daily.    . Cholecalciferol 2000 units TABS Take 1 tablet (2,000 Units total) by mouth daily.  . Cyanocobalamin (VITAMIN B-12 CR PO) Take 1 tablet by mouth daily.  . glucose blood (ACCU-CHEK AVIVA PLUS) test strip 1 each by Other route 2 (two) times daily. Use to check blood sugars twice a day Dx E11.9  . INVOKANA 300 MG TABS tablet TAKE 1 TABLET BY MOUTH ONCE DAILY  . metFORMIN (GLUCOPHAGE) 1000 MG tablet Take 1 tablet (1,000 mg total) by mouth 2 (two) times daily with a meal.  . olmesartan-hydrochlorothiazide (BENICAR HCT) 40-12.5 MG tablet Take 1 tablet by mouth daily.  . simvastatin (ZOCOR) 40 MG tablet Take 1 tablet (40 mg total) by mouth daily.   No facility-administered encounter medications on file as of 08/21/2018.     Activities of Daily Living No flowsheet data found.  Patient Care Team: Jones, Thomas L, MD as PCP - General (Internal Medicine)    Assessment:   This is a routine wellness examination for Holly [name]. Physical assessment deferred to PCP.   Exercise Activities and Dietary recommendations   Diet (meal preparation, eat out, water intake, caffeinated beverages, dairy products, fruits and vegetables): {Desc; diets:16563}   Goals    . lose weight      Increase the amount of physical activity like chair exercises and continue decrease carbohydrates and sugar,  drink plenty of water, enjoy life and family.    . Weight < 300 lb (136.079 kg)     BMI ;48;  reviewed and educated Educated regarding online nutrition programs as chosemyplate.gov and myfitnesspal.com  Plans to go back to Physician Weight loss with past success  [facility]   Address: [address], [place], [place] [address]  Phone: [phone]               Fall Risk Fall Risk  08/14/2017 03/07/2017 10/21/2015  04/20/2015 04/09/2014  Falls in the past year? No No No No No  Risk for fall due to : Impaired balance/gait;Impaired mobility - - - -    Depression Screen PHQ 2/9 Scores 08/14/2017 03/07/2017 10/21/2015 04/20/2015  PHQ - 2 Score 0 0 0 0  PHQ- 9 Score 0 - - -     Cognitive Function MMSE - Mini Mental State Exam 08/14/2017 10/21/2015  Not completed: - (No Data)  Orientation to time 5 -    Orientation to Place 5 -  Registration 3 -  Attention/ Calculation 5 -  Recall 2 -  Language- name 2 objects 2 -  Language- repeat 1 -  Language- follow 3 step command 3 -  Language- read & follow direction 1 -  Write a sentence 1 -  Copy design 1 -  Total score 29 -        Immunization History  Administered Date(s) Administered  . Influenza Split 08/15/2012  . Influenza Whole 07/15/2008, 07/26/2010, 07/19/2011  . Influenza, High Dose Seasonal PF 09/04/2013, 09/27/2016, 08/14/2017  . Influenza,inj,Quad PF,6+ Mos 07/11/2014  . Influenza-Unspecified 08/15/2015  . Pneumococcal Conjugate-13 04/08/2014  . Pneumococcal Polysaccharide-23 02/14/2012  . Td 09/16/2008  . Zoster 07/09/2015   Screening Tests Health Maintenance  Topic Date Due  . INFLUENZA VACCINE  06/14/2018  . OPHTHALMOLOGY EXAM  09/15/2018  . TETANUS/TDAP  09/16/2018  . HEMOGLOBIN A1C  10/05/2018  . FOOT EXAM  05/17/2019  . Fecal DNA (Cologuard)  09/14/2020  . DEXA SCAN  Completed  . PNA vac Low Risk Adult  Completed      Plan:    I have personally reviewed and noted the following in the patient's chart:   . Medical and social history . Use of alcohol, tobacco or illicit drugs  . Current medications and supplements . Functional ability and status . Nutritional status . Physical activity . Advanced directives . List of other physicians . Vitals . Screenings to include cognitive, depression, and falls . Referrals and appointments  In addition, I have reviewed and discussed with patient certain preventive protocols,  quality metrics, and best practice recommendations. A written personalized care plan for preventive services as well as general preventive health recommendations were provided to patient.      A , RN  08/20/2018     

## 2018-08-21 ENCOUNTER — Ambulatory Visit: Payer: Medicare PPO

## 2018-08-21 NOTE — Progress Notes (Addendum)
Subjective:   Holly [name] is a 77 y.o. female who presents for Medicare Annual (Subsequent) preventive examination.  Review of Systems:  No ROS.  Medicare Wellness Visit. Additional risk factors are reflected in the social history.  Cardiac Risk Factors include: advanced age (>68mn, >>24women);diabetes mellitus;hypertension;dyslipidemia;obesity (BMI >30kg/m2) Sleep patterns: feels rested on waking, gets up 1-2 times nightly to void and sleeps 7-8 hours nightly.  Wears C-PAP  Home Safety/Smoke Alarms: Feels safe in home. Smoke alarms in place.  Living environment; residence and Firearm Safety: 1-story house/ trailer, equipment: [place]: [place]. Brother lives with patient, good support system, needs rollator walker to assist with safe mobility outside.  Seat Belt Safety/Bike Helmet: Wears seat belt.     Objective:     Vitals: BP (!) 154/82   Pulse 74   Resp 17   Ht '5\' 10"'  (1.778 m)   Wt (!) 318 lb (144.2 kg)   SpO2 98%   BMI 45.63 kg/m   Body mass index is 45.63 kg/m.  Advanced Directives 08/22/2018 08/14/2017 10/21/2015  Does Patient Have a Medical Advance Directive? Yes;No No Yes  Type of AAcademic librarianLiving will - -  [place]? - - Yes  Would patient like information on creating a medical advance directive? - Yes (ED - Information included in AVS) -    Tobacco Social History   Tobacco Use  Smoking Status Former Smoker  . Packs/day: 0.50  . Years: 25.00  . Pack years: 12.50  . Types: Cigarettes  Smokeless Tobacco Never Used     Counseling given: Not Answered  Past Medical History:  Diagnosis Date  . Closed dislocation of shoulder    unspecified site  . DJD (degenerative joint disease) of knee    Right knee  . DM [place]diabetes mellitus) ([place]   . Hyperlipidemia   . Hypertension   . Obesity, morbid ([place]   . OSA (obstructive sleep apnea) 02/03/2017   Past  Surgical History:  Procedure Laterality Date  . CHOLECYSTECTOMY  1989  . KNEE ARTHROSCOPY  2010   LEFT/ Dr DSharol Given . TUBAL LIGATION  1979   Family History  Problem Relation Age of Onset  . Heart disease Mother   . Kidney disease Father   . Aortic aneurysm Brother        Survived rupture   Social History   Socioeconomic History  . Marital status: Divorced    Spouse name: Not on file  . Number of children: 2  . Years of education: Not on file  . Highest education level: Not on file  Occupational History  . Occupation: Assembly LEngineer, manufacturing  . Occupation: CConservation officer, historic buildings [employer]   Comment: RETIRED  Social Needs  . Financial resource strain: Not hard at all  . Food insecurity:    Worry: Never true    Inability: Never true  . Transportation needs:    Medical: No    Non-medical: No  Tobacco Use  . Smoking status: Former Smoker    Packs/day: 0.50    Years: 25.00    Pack years: 12.50    Types: Cigarettes  . Smokeless tobacco: Never Used  Substance and Sexual Activity  . Alcohol use: No    Alcohol/week: 0.0 standard drinks  . Drug use: No  . Sexual activity: Not Currently  Lifestyle  . Physical activity:  Days per week: 0 days    Minutes per session: 0 min  . Stress: Only a little  Relationships  . Social connections:    Talks on phone: More than three times a week    Gets together: More than three times a week    Attends religious service: More than 4 times per year    Active member of club or organization: Yes    Attends meetings of clubs or organizations: More than 4 times per year    Relationship status: Divorced  Other Topics Concern  . Not on file  Social History Narrative   RETIRED   Married 52 yrs, currently divorced   Daughter born '65, Son born '69; 2 grandchildren - 1 here, 1 in [place]    Outpatient Encounter Medications as of 08/22/2018  Medication Sig  . ACCU-CHEK SOFTCLIX LANCETS lancets 1 each by Other  route 2 (two) times daily. Use to check blood sugars twice a day Dx e11.9  . aspirin 81 MG EC tablet Take 81 mg by mouth daily.    . Blood Glucose Monitoring Suppl (ACCU-CHEK AVIVA PLUS) w/Device KIT Use to check blood sugars daily Dx E11.9  . carvedilol (COREG) 6.25 MG tablet TAKE 1 TABLET TWICE DAILY WITH MEALS  . cetirizine (ZYRTEC ALLERGY) 10 MG tablet Take 10 mg by mouth daily.    . Cholecalciferol 2000 units TABS Take 1 tablet (2,000 Units total) by mouth daily.  . Cyanocobalamin (VITAMIN B-12 CR PO) Take 1 tablet by mouth daily.  Marland Kitchen glucose blood (ACCU-CHEK AVIVA PLUS) test strip 1 each by Other route 2 (two) times daily. Use to check blood sugars twice a day Dx E11.9  . metFORMIN (GLUCOPHAGE) 1000 MG tablet Take 1 tablet (1,000 mg total) by mouth 2 (two) times daily with a meal.  . olmesartan-hydrochlorothiazide (BENICAR HCT) 40-12.5 MG tablet Take 1 tablet by mouth daily.  . simvastatin (ZOCOR) 40 MG tablet Take 1 tablet (40 mg total) by mouth daily.  . [DISCONTINUED] canagliflozin (INVOKANA) 300 MG TABS tablet Take 1 tablet (300 mg total) by mouth daily. (Patient not taking: Reported on 08/22/2018)  . [DISCONTINUED] INVOKANA 300 MG TABS tablet TAKE 1 TABLET BY MOUTH ONCE DAILY (Patient not taking: Reported on 08/22/2018)   No facility-administered encounter medications on file as of 08/22/2018.     Activities of Daily Living In your present state of health, do you have any difficulty performing the following activities: 08/22/2018  Hearing? N  Vision? N  Difficulty concentrating or making decisions? N  Walking or climbing stairs? Y  Dressing or bathing? N  Doing errands, shopping? Y  Preparing Food and eating ? N  Using the Toilet? N  In the past six months, have you accidently leaked urine? N  Do you have problems with loss of bowel control? N  Managing your Medications? N  Managing your Finances? N  Housekeeping or managing your Housekeeping? N  Some recent data might be  hidden    Patient Care Team: [name], MD as PCP - General (Internal Medicine)    Assessment:   This is a routine wellness examination for Holly [name]. Physical assessment deferred to PCP.   Exercise Activities and Dietary recommendations Current Exercise Habits: The patient does not participate in regular exercise at present(chair exercise print-outs provided), Exercise limited by: orthopedic condition(s)  Diet (meal preparation, eat out, water intake, caffeinated beverages, dairy products, fruits and vegetables): in general, a "healthy" diet     Reviewed heart healthy and  diabetic diet. Encouraged patient to increase daily water and healthy fluid intake.  Goals    . lose weight      Increase the amount of physical activity like chair exercises and continue decrease carbohydrates and sugar,  drink plenty of water, enjoy life and family.    . Patient Stated     I want to increase my physical activity by walking, doing chair exercises and buy foot pedal equipment.    . Weight < 300 lb (136.079 kg)     BMI ;48;  reviewed and educated Educated regarding online nutrition programs as GumSearch.nl and http://vang.com/  Plans to go back to Physician Weight loss with past success  [facility]   Address: [address] [place], [place] [address]  Phone: [phone]               Fall Risk Fall Risk  08/22/2018 08/14/2017 03/07/2017 10/21/2015 04/20/2015  Falls in the past year? No No No No No  Risk for fall due to : Impaired balance/gait;Impaired mobility;History of fall(s) Impaired balance/gait;Impaired mobility - - -  Risk for fall due to: Comment patient feels imbalanced especially in outside environment - - - -    Depression Screen PHQ 2/9 Scores 08/22/2018 08/14/2017 03/07/2017 10/21/2015  PHQ - 2 Score 0 0 0 0  PHQ- 9 Score - 0 - -     Cognitive Function MMSE - Mini Mental State Exam 08/22/2018 08/14/2017 10/21/2015    Not completed: - - (No Data)  Orientation to time 5 5 -  Orientation to Place 5 5 -  Registration 3 3 -  Attention/ Calculation 5 5 -  Recall 1 2 -  Language- name 2 objects 2 2 -  Language- repeat 1 1 -  Language- follow 3 step command 3 3 -  Language- read & follow direction 1 1 -  Write a sentence 1 1 -  Copy design 1 1 -  Total score 28 29 -        Immunization History  Administered Date(s) Administered  . Influenza Split 08/15/2012  . Influenza Whole 07/15/2008, 07/26/2010, 07/19/2011  . Influenza, High Dose Seasonal PF 09/04/2013, 09/27/2016, 08/14/2017, 08/22/2018  . Influenza,inj,Quad PF,6+ Mos 07/11/2014  . Influenza-Unspecified 08/15/2015  . Pneumococcal Conjugate-13 04/08/2014  . Pneumococcal Polysaccharide-23 02/14/2012  . Td 09/16/2008  . Zoster 07/09/2015   Screening Tests Health Maintenance  Topic Date Due  . INFLUENZA VACCINE  06/14/2018  . OPHTHALMOLOGY EXAM  09/15/2018  . TETANUS/TDAP  09/16/2018  . HEMOGLOBIN A1C  10/05/2018  . FOOT EXAM  05/17/2019  . Fecal DNA (Cologuard)  09/14/2020  . DEXA SCAN  Completed  . PNA vac Low Risk Adult  Completed      Plan:    Rollator walker ordered to help maintain Patient's safety and independence of ADLs.  Continue doing brain stimulating activities (puzzles, reading, adult coloring books, staying active) to keep memory sharp.   Continue to eat heart healthy diet (full of fruits, vegetables, whole grains, lean protein, water--limit salt, fat, and sugar intake) and increase physical activity as tolerated.   I have personally reviewed and noted the following in the patient's chart:   . Medical and social history . Use of alcohol, tobacco or illicit drugs  . Current medications and supplements . Functional ability and status . Nutritional status . Physical activity . Advanced directives . List of other physicians . Vitals . Screenings to include cognitive, depression, and falls .  Referrals and  appointments  In addition, I have reviewed and discussed with patient certain preventive protocols, quality metrics, and best practice recommendations. A written personalized care plan for preventive services as well as general preventive health recommendations were provided to patient.     Michiel Cowboy, RN  08/22/2018   Medical screening examination/treatment/procedure(s) were performed by non-physician practitioner and as supervising physician I was immediately available for consultation/collaboration. I agree with above. Scarlette Calico, MD

## 2018-08-22 ENCOUNTER — Ambulatory Visit (INDEPENDENT_AMBULATORY_CARE_PROVIDER_SITE_OTHER): Payer: Medicare PPO | Admitting: *Deleted

## 2018-08-22 VITALS — BP 154/82 | HR 74 | Resp 17 | Ht 70.0 in | Wt 318.0 lb

## 2018-08-22 DIAGNOSIS — Z23 Encounter for immunization: Secondary | ICD-10-CM

## 2018-08-22 DIAGNOSIS — Z9181 History of falling: Secondary | ICD-10-CM

## 2018-08-22 DIAGNOSIS — Z0000 Encounter for general adult medical examination without abnormal findings: Secondary | ICD-10-CM

## 2018-08-22 NOTE — Patient Instructions (Addendum)
Continue doing brain stimulating activities (puzzles, reading, adult coloring books, staying active) to keep memory sharp.   Continue to eat heart healthy diet (full of fruits, vegetables, whole grains, lean protein, water--limit salt, fat, and sugar intake) and increase physical activity as tolerated.  [name] , Thank you for taking time to come for your Medicare Wellness Visit. I appreciate your ongoing commitment to your health goals. Please review the following plan we discussed and let me know if I can assist you in the future.   These are the goals we discussed: Goals    . lose weight      Increase the amount of physical activity like chair exercises and continue decrease carbohydrates and sugar,  drink plenty of water, enjoy life and family.    . Patient Stated     I want to increase my physical activity by walking, doing chair exercises and buy foot pedal equipment.    . Weight < 300 lb (136.079 kg)     BMI ;48;  reviewed and educated Educated regarding online nutrition programs as GumSearch.nl and http://vang.com/  Plans to go back to Physician Weight loss with past success  [facility]   Address: [address], [place], [place] [address]  Phone: [phone]               This is a list of the screening recommended for you and due dates:  Health Maintenance  Topic Date Due  . Flu Shot  06/14/2018  . Eye exam for diabetics  09/15/2018  . Tetanus Vaccine  09/16/2018  . Hemoglobin A1C  10/05/2018  . Complete foot exam   05/17/2019  . Cologuard (Stool DNA test)  09/14/2020  . DEXA scan (bone density measurement)  Completed  . Pneumonia vaccines  Completed   Influenza Virus Vaccine injection What is this medicine? INFLUENZA VIRUS VACCINE (in floo EN zuh VAHY ruhs vak SEEN) helps to reduce the risk of getting influenza also known as the flu. The vaccine only helps protect you against some strains of the  flu. This medicine may be used for other purposes; ask your health care provider or pharmacist if you have questions. COMMON BRAND NAME(S): Afluria, Agriflu, Alfuria, FLUAD, Fluarix, Fluarix Quadrivalent, Flublok, Flublok Quadrivalent, FLUCELVAX, Flulaval, Fluvirin, Fluzone, Fluzone High-Dose, Fluzone Intradermal What should I tell my health care provider before I take this medicine? They need to know if you have any of these conditions: -bleeding disorder like hemophilia -fever or infection -Guillain-Barre syndrome or other neurological problems -immune system problems -infection with the human immunodeficiency virus (HIV) or AIDS -low blood platelet counts -multiple sclerosis -an unusual or allergic reaction to influenza virus vaccine, latex, other medicines, foods, dyes, or preservatives. Different brands of vaccines contain different allergens. Some may contain latex or eggs. Talk to your doctor about your allergies to make sure that you get the right vaccine. -pregnant or trying to get pregnant -breast-feeding How should I use this medicine? This vaccine is for injection into a muscle or under the skin. It is given by a health care professional. A copy of Vaccine Information Statements will be given before each vaccination. Read this sheet carefully each time. The sheet may change frequently. Talk to your healthcare provider to see which vaccines are right for you. Some vaccines should not be used in all age groups. Overdosage: If you think you have taken too much of this medicine contact a poison control center or emergency room  at once. NOTE: This medicine is only for you. Do not share this medicine with others. What if I miss a dose? This does not apply. What may interact with this medicine? -chemotherapy or radiation therapy -medicines that lower your immune system like etanercept, anakinra, infliximab, and adalimumab -medicines that treat or prevent blood clots like  warfarin -phenytoin -steroid medicines like prednisone or cortisone -theophylline -vaccines This list may not describe all possible interactions. Give your health care provider a list of all the medicines, herbs, non-prescription drugs, or dietary supplements you use. Also tell them if you smoke, drink alcohol, or use illegal drugs. Some items may interact with your medicine. What should I watch for while using this medicine? Report any side effects that do not go away within 3 days to your doctor or health care professional. Call your health care provider if any unusual symptoms occur within 6 weeks of receiving this vaccine. You may still catch the flu, but the illness is not usually as bad. You cannot get the flu from the vaccine. The vaccine will not protect against colds or other illnesses that may cause fever. The vaccine is needed every year. What side effects may I notice from receiving this medicine? Side effects that you should report to your doctor or health care professional as soon as possible: -allergic reactions like skin rash, itching or hives, swelling of the face, lips, or tongue Side effects that usually do not require medical attention (report to your doctor or health care professional if they continue or are bothersome): -fever -headache -muscle aches and pains -pain, tenderness, redness, or swelling at the injection site -tiredness This list may not describe all possible side effects. Call your doctor for medical advice about side effects. You may report side effects to FDA at 1-800-FDA-1088. Where should I keep my medicine? The vaccine will be given by a health care professional in a clinic, pharmacy, doctor's office, or other health care setting. You will not be given vaccine doses to store at home. NOTE: This sheet is a summary. It may not cover all possible information. If you have questions about this medicine, talk to your doctor, pharmacist, or health care  provider.  2018 Elsevier/Gold Standard (2015-05-22 10:07:28) It is important to avoid accidents which may result in broken bones.  Here are a few ideas on how to make your home safer so you will be less likely to trip or fall.  1. Use nonskid mats or non slip strips in your shower or tub, on your bathroom floor and around sinks.  If you know that you have spilled water, wipe it up! 2. In the bathroom, it is important to have properly installed grab bars on the walls or on the edge of the tub.  Towel racks are NOT strong enough for you to hold onto or to pull on for support. 3. Stairs and hallways should have enough light.  Add lamps or night lights if you need ore light. 4. It is good to have handrails on both sides of the stairs if possible.  Always fix broken handrails right away. 5. It is important to see the edges of steps.  Paint the edges of outdoor steps white so you can see them better.  Put colored tape on the edge of inside steps. 6. Throw-rugs are dangerous because they can slide.  Removing the rugs is the best idea, but if they must stay, add adhesive carpet tape to prevent slipping. 7. Do not keep things on  stairs or in the halls.  Remove small furniture that blocks the halls as it may cause you to trip.  Keep telephone and electrical cords out of the way where you walk. 8. Always were sturdy, rubber-soled shoes for good support.  Never wear just socks, especially on the stairs.  Socks may cause you to slip or fall.  Do not wear full-length housecoats as you can easily trip on the bottom.  9. Place the things you use the most on the shelves that are the easiest to reach.  If you use a stepstool, make sure it is in good condition.  If you feel unsteady, DO NOT climb, ask for help. 10. If a health professional advises you to use a cane or walker, do not be ashamed.  These items can keep you from falling and breaking your bones.

## 2018-08-27 DIAGNOSIS — Z1231 Encounter for screening mammogram for malignant neoplasm of breast: Secondary | ICD-10-CM | POA: Diagnosis not present

## 2018-08-28 DIAGNOSIS — R269 Unspecified abnormalities of gait and mobility: Secondary | ICD-10-CM | POA: Diagnosis not present

## 2018-08-28 DIAGNOSIS — R2689 Other abnormalities of gait and mobility: Secondary | ICD-10-CM | POA: Diagnosis not present

## 2018-09-10 ENCOUNTER — Other Ambulatory Visit: Payer: Self-pay | Admitting: Internal Medicine

## 2018-09-12 ENCOUNTER — Encounter: Payer: Self-pay | Admitting: Internal Medicine

## 2018-09-12 ENCOUNTER — Ambulatory Visit: Payer: Medicare PPO | Admitting: Internal Medicine

## 2018-09-12 ENCOUNTER — Ambulatory Visit: Payer: Self-pay | Admitting: *Deleted

## 2018-09-12 VITALS — BP 162/98 | HR 81 | Temp 98.0°F | Resp 18 | Ht 70.0 in | Wt 324.0 lb

## 2018-09-12 DIAGNOSIS — R600 Localized edema: Secondary | ICD-10-CM

## 2018-09-12 MED ORDER — FUROSEMIDE 40 MG PO TABS: 40.0000 mg | ORAL_TABLET | Freq: Every day | ORAL | 0 refills | Status: DC

## 2018-09-12 MED ORDER — OLMESARTAN MEDOXOMIL-HCTZ 40-25 MG PO TABS: 1.0000 | ORAL_TABLET | Freq: Every day | ORAL | 0 refills | Status: DC

## 2018-09-12 NOTE — Progress Notes (Signed)
Subjective:    Patient ID: Holly [name], female    DOB: [birth year], 77 y.o.   MRN: [id number]  HPI The patient is here for an acute visit.   Bilateral leg swelling - it started 3 weeks ago after her steroid injection in her back. She did not have swelling in her legs prior to that. The left leg is worse than the right leg.  She has been having headaches, but wonders if it is related to her elevated blood pressure.  She states her blood pressure is typically well controlled.  She denies change in medication or increase in salt intake.  She does use the salt.  When the swelling first started she states it would go down overnight, but now the swelling seems to be getting worse.  She does elevate her legs when she sits.  She does not exercise.  She denies any shortness of breath, chest pain or palpitations.  There have been no fevers or chills.  She denies any changes in her skin.  Medications and allergies reviewed with patient and updated if appropriate.  Patient Active Problem List   Diagnosis Date Noted  . Vitamin D deficiency 04/04/2018  . OSA (obstructive sleep apnea) 02/03/2017  . Snoring 10/25/2016  . Estrogen deficiency 12/28/2015  . Colon cancer screening 12/28/2015  . Lumbar radiculopathy 09/09/2014  . Atherosclerosis of native arteries of extremity with intermittent claudication ([place]) 08/18/2014  . Primary localized osteoarthrosis, lower leg 06/11/2014  . Lichen sclerosus et atrophicus of the vulva 06/10/2014  . Urinary incontinence 11/06/2013  . Routine health maintenance 05/06/2013  . Type II diabetes mellitus with manifestations ([place]) 09/16/2008  . Hyperlipidemia with target LDL less than 100 09/16/2008  . Morbid obesity ([place]) 09/16/2008  . Hypertension 09/16/2008    Current Outpatient Medications on File Prior to Visit  Medication Sig Dispense Refill  . ACCU-CHEK SOFTCLIX LANCETS lancets 1 each by Other route 2 (two) times daily. Use to check blood sugars twice a  day Dx e11.9 200 each 1  . aspirin 81 MG EC tablet Take 81 mg by mouth daily.      . Blood Glucose Monitoring Suppl (ACCU-CHEK AVIVA PLUS) w/Device KIT Use to check blood sugars daily Dx E11.9 1 kit 0  . carvedilol (COREG) 6.25 MG tablet TAKE 1 TABLET TWICE DAILY WITH MEALS 180 tablet 1  . cetirizine (ZYRTEC ALLERGY) 10 MG tablet Take 10 mg by mouth daily.      . Cholecalciferol 2000 units TABS Take 1 tablet (2,000 Units total) by mouth daily. 90 tablet 1  . Cyanocobalamin (VITAMIN B-12 CR PO) Take 1 tablet by mouth daily.    Marland Kitchen glucose blood (ACCU-CHEK AVIVA PLUS) test strip 1 each by Other route 2 (two) times daily. Use to check blood sugars twice a day Dx E11.9 200 each 1  . metFORMIN (GLUCOPHAGE) 1000 MG tablet TAKE 1 TABLET BY MOUTH TWICE DAILY WITH MEALS 180 tablet 1  . olmesartan-hydrochlorothiazide (BENICAR HCT) 40-12.5 MG tablet Take 1 tablet by mouth daily. 90 tablet 1  . simvastatin (ZOCOR) 40 MG tablet Take 1 tablet (40 mg total) by mouth daily. 90 tablet 1   No current facility-administered medications on file prior to visit.     Past Medical History:  Diagnosis Date  . Closed dislocation of shoulder    unspecified site  . DJD (degenerative joint disease) of knee    Right knee  . DM (diabetes mellitus) ([place])   . Hyperlipidemia   .  Hypertension   . Obesity, morbid ([place])   . OSA (obstructive sleep apnea) 02/03/2017    Past Surgical History:  Procedure Laterality Date  . CHOLECYSTECTOMY  1989  . KNEE ARTHROSCOPY  2010   LEFT/ Dr Sharol Given  . TUBAL LIGATION  1979    Social History   Socioeconomic History  . Marital status: Divorced    Spouse name: Not on file  . Number of children: 2  . Years of education: Not on file  . Highest education level: Not on file  Occupational History  . Occupation: Assembly Engineer, manufacturing   . Occupation: Conservation officer, historic buildings: [employer]    Comment: RETIRED  Social Needs  . Financial resource strain: Not hard  at all  . Food insecurity:    Worry: Never true    Inability: Never true  . Transportation needs:    Medical: No    Non-medical: No  Tobacco Use  . Smoking status: Former Smoker    Packs/day: 0.50    Years: 25.00    Pack years: 12.50    Types: Cigarettes  . Smokeless tobacco: Never Used  Substance and Sexual Activity  . Alcohol use: No    Alcohol/week: 0.0 standard drinks  . Drug use: No  . Sexual activity: Not Currently  Lifestyle  . Physical activity:    Days per week: 0 days    Minutes per session: 0 min  . Stress: Only a little  Relationships  . Social connections:    Talks on phone: More than three times a week    Gets together: More than three times a week    Attends religious service: More than 4 times per year    Active member of club or organization: Yes    Attends meetings of clubs or organizations: More than 4 times per year    Relationship status: Divorced  Other Topics Concern  . Not on file  Social History Narrative   RETIRED   Married 63 yrs, currently divorced   Daughter born '65, Son born '69; 2 grandchildren - 16 here, 33 in [place]    Family History  Problem Relation Age of Onset  . Heart disease Mother   . Kidney disease Father   . Aortic aneurysm Brother        Survived rupture    Review of Systems  Constitutional: Negative for chills and fever.  Respiratory: Negative for cough, shortness of breath and wheezing.   Cardiovascular: Positive for leg swelling. Negative for chest pain and palpitations.  Skin: Negative for color change and rash.  Neurological: Positive for headaches. Negative for dizziness and light-headedness.       Objective:   Vitals:   09/12/18 1522  BP: (!) 162/98  Pulse: 81  Resp: 18  Temp: 98 F (36.7 C)  SpO2: 98%   BP Readings from Last 3 Encounters:  09/12/18 (!) 162/98  08/22/18 (!) 154/82  05/16/18 122/78   Wt Readings from Last 3 Encounters:  09/12/18 (!) 324 lb (147 kg)  08/22/18 (!) 318 lb (144.2 kg)   05/16/18 (!) 311 lb (141.1 kg)   Body mass index is 46.49 kg/m.   Physical Exam  Constitutional: She appears well-developed and well-nourished. No distress.  HENT:  Head: Normocephalic and atraumatic.  Cardiovascular: Normal rate, regular rhythm and normal heart sounds.  Bilateral lower extremity swelling up to knees, left leg more than right leg-1+ left lower extremity  Pulmonary/Chest: Effort normal and  breath sounds normal. No respiratory distress. She has no wheezes. She has no rales.  Skin: Skin is warm and dry. No rash noted. She is not diaphoretic. No erythema.           Assessment & Plan:    See Problem List for Assessment and Plan of chronic medical problems.

## 2018-09-12 NOTE — Patient Instructions (Addendum)
  Medications reviewed and updated.  Changes include :   Taking furosemide (lasix) 40 mg daily in the morning for two days only.   Then change your BP medication to Benicar-hct 40-25 mg daily. ( start Saturday).   No salt.  Elevate your legs when sitting.     Have blood work done in one week.

## 2018-09-12 NOTE — Assessment & Plan Note (Signed)
Bilateral leg edema, left leg worse than right-started 3 weeks ago after steroid injection in her back and has worsened No CHF symptoms Discussed low-sodium diet, continue to elevate legs when sitting Discussed the importance of weight loss Lasix 40 mg daily for 2 days After completing Lasix increase Benicar HCT to 40-25 mg daily BMP in 1 week

## 2018-09-12 NOTE — Telephone Encounter (Signed)
Patient has been experiencing bilat lower extremity swelling for approximately 1 1/2 weeks. Denies any pain/CP/SOB.Reports the left is greater than right. Swelling starts below the knees to the tops of the feet. No redness/warmth/weeping/streaking reported. Reports she has spinal stenosis and thinks it may have something to do with her legs.Appointment made for this afternoon with Dr. Quay Burow. PCP unavailable today and tomorrow.  Reason for Disposition . [1] Thigh, calf, or ankle swelling AND [2] bilateral AND [3] 1 side is more swollen  Answer Assessment - Initial Assessment Questions 1. ONSET: "When did the swelling start?" (e.g., minutes, hours, days)     About one and half weeks 2. LOCATION: "What part of the leg is swollen?"  "Are both legs swollen or just one leg?"     Both legs below the knees are swollen Left >Right. 3. SEVERITY: "How bad is the swelling?" (e.g., localized; mild, moderate, severe)  - Localized - small area of swelling localized to one leg  - MILD pedal edema - swelling limited to foot and ankle, pitting edema < 1/4 inch (6 mm) deep, rest and elevation eliminate most or all swelling  - MODERATE edema - swelling of lower leg to knee, pitting edema > 1/4 inch (6 mm) deep, rest and elevation only partially reduce swelling  - SEVERE edema - swelling extends above knee, facial or hand swelling present      Moderate. Left leg is tight, right is not. 4. REDNESS: "Does the swelling look red or infected?"     no 5. PAIN: "Is the swelling painful to touch?" If so, ask: "How painful is it?"   (Scale 1-10; mild, moderate or severe)     No pain 6. FEVER: "Do you have a fever?" If so, ask: "What is it, how was it measured, and when did it start?"      no 7. CAUSE: "What do you think is causing the leg swelling?"     She thinks it has something to do with her back issues. 8. MEDICAL HISTORY: "Do you have a history of heart failure, kidney disease, liver failure, or cancer?"     Htn,  DM, high cholesterol 9. RECURRENT SYMPTOM: "Have you had leg swelling before?" If so, ask: "When was the last time?" "What happened that time?"     Yes, right leg has swollen before. MRI was negative 10. OTHER SYMPTOMS: "Do you have any other symptoms?" (e.g., chest pain, difficulty breathing)       no 11. PREGNANCY: "Is there any chance you are pregnant?" "When was your last menstrual period?"       no  Protocols used: LEG SWELLING AND EDEMA-A-AH

## 2018-09-13 ENCOUNTER — Telehealth: Payer: Self-pay | Admitting: Internal Medicine

## 2018-09-13 DIAGNOSIS — E118 Type 2 diabetes mellitus with unspecified complications: Secondary | ICD-10-CM

## 2018-09-13 DIAGNOSIS — I10 Essential (primary) hypertension: Secondary | ICD-10-CM

## 2018-09-13 MED ORDER — OLMESARTAN MEDOXOMIL-HCTZ 40-12.5 MG PO TABS: 1.0000 | ORAL_TABLET | Freq: Every day | ORAL | 1 refills | Status: DC

## 2018-09-13 NOTE — Telephone Encounter (Signed)
Have her continue her current Blood pressure medication.  If the swelling in her legs recurs or if her blood pressure is not well controlled we can change the medication at that time.

## 2018-09-13 NOTE — Telephone Encounter (Signed)
Copied from [place] [phone]. Topic: Quick Communication - See Telephone Encounter >> Sep 13, 2018  9:26 AM Ahmed Prima L wrote: CRM for notification. See Telephone encounter for: 09/13/18.  Patient states Dr Eilleen Kempf prescribed her olmesartan-hydrochlorothiazide (BENICAR HCT) 40-25 MG tablet yesterday and [facility] advised her that they would not have that until the first of the year. She would like to know could Dr Quay Burow give her something else, she can not use another pharmacy due to her insurance. Please advise.

## 2018-09-13 NOTE — Telephone Encounter (Signed)
Pt aware of response.  

## 2018-09-25 ENCOUNTER — Ambulatory Visit: Payer: Medicare PPO | Admitting: Internal Medicine

## 2018-09-25 ENCOUNTER — Encounter: Payer: Self-pay | Admitting: Internal Medicine

## 2018-09-25 ENCOUNTER — Other Ambulatory Visit (INDEPENDENT_AMBULATORY_CARE_PROVIDER_SITE_OTHER): Payer: Medicare PPO

## 2018-09-25 VITALS — BP 176/102 | HR 79 | Temp 98.1°F | Resp 16 | Ht 70.0 in | Wt 323.8 lb

## 2018-09-25 DIAGNOSIS — I10 Essential (primary) hypertension: Secondary | ICD-10-CM | POA: Diagnosis not present

## 2018-09-25 DIAGNOSIS — Z23 Encounter for immunization: Secondary | ICD-10-CM | POA: Diagnosis not present

## 2018-09-25 DIAGNOSIS — E118 Type 2 diabetes mellitus with unspecified complications: Secondary | ICD-10-CM

## 2018-09-25 LAB — BASIC METABOLIC PANEL
BUN: 18 mg/dL (ref 6–23)
CO2: 26 mEq/L (ref 19–32)
Calcium: 9.5 mg/dL (ref 8.4–10.5)
Chloride: 100 mEq/L (ref 96–112)
Creatinine, Ser: 1.12 mg/dL (ref 0.40–1.20)
GFR: 60.93 mL/min (ref >60.00)
GLUCOSE: 221 mg/dL — AB (ref 70–99)
POTASSIUM: 3.7 meq/L (ref 3.5–5.1)
Sodium: 137 mEq/L (ref 135–145)

## 2018-09-25 LAB — HEMOGLOBIN A1C: Hgb A1c MFr Bld: 7.9 % — ABNORMAL HIGH (ref 4.6–6.5)

## 2018-09-25 MED ORDER — DAPAGLIFLOZIN PROPANEDIOL 10 MG PO TABS: 10.0000 mg | ORAL_TABLET | Freq: Every day | ORAL | 1 refills | Status: DC

## 2018-09-25 MED ORDER — AZILSARTAN-CHLORTHALIDONE 40-25 MG PO TABS: 1.0000 | ORAL_TABLET | Freq: Every day | ORAL | 0 refills | Status: DC

## 2018-09-25 NOTE — Patient Instructions (Signed)

## 2018-09-25 NOTE — Progress Notes (Signed)
Subjective:  Patient ID: Holly [name], female    DOB: [birth year]  Age: 77 y.o. MRN: [id number]  CC: Hypertension and Diabetes   HPI Holly [name] presents for f/up - She complains that her blood pressure and blood sugar have not been well controlled.  She has had a few headaches recently but she denies CP, DOE, palpitations, edema, or fatigue.  She saw someone else a few weeks ago and a loop diuretic was prescribed but only 2 doses were given.  She was also prescribed Benicar HCT but she said her pharmacy cannot get that for the next 2 months.  She is currently only taking carvedilol for high blood pressure.  She is compliant with metformin for blood sugar control.  Outpatient Medications Prior to Visit  Medication Sig Dispense Refill  . ACCU-CHEK SOFTCLIX LANCETS lancets 1 each by Other route 2 (two) times daily. Use to check blood sugars twice a day Dx e11.9 200 each 1  . aspirin 81 MG EC tablet Take 81 mg by mouth daily.      . Blood Glucose Monitoring Suppl (ACCU-CHEK AVIVA PLUS) w/Device KIT Use to check blood sugars daily Dx E11.9 1 kit 0  . carvedilol (COREG) 6.25 MG tablet TAKE 1 TABLET TWICE DAILY WITH MEALS 180 tablet 1  . cetirizine (ZYRTEC ALLERGY) 10 MG tablet Take 10 mg by mouth daily.      . Cholecalciferol 2000 units TABS Take 1 tablet (2,000 Units total) by mouth daily. 90 tablet 1  . Cyanocobalamin (VITAMIN B-12 CR PO) Take 1 tablet by mouth daily.    Marland Kitchen glucose blood (ACCU-CHEK AVIVA PLUS) test strip 1 each by Other route 2 (two) times daily. Use to check blood sugars twice a day Dx E11.9 200 each 1  . metFORMIN (GLUCOPHAGE) 1000 MG tablet TAKE 1 TABLET BY MOUTH TWICE DAILY WITH MEALS 180 tablet 1  . simvastatin (ZOCOR) 40 MG tablet Take 1 tablet (40 mg total) by mouth daily. 90 tablet 1  . olmesartan-hydrochlorothiazide (BENICAR HCT) 40-12.5 MG tablet Take 1 tablet by mouth daily. 90 tablet 1  . furosemide (LASIX) 40 MG tablet Take 1 tablet (40 mg total) by mouth  daily for 2 days. 2 tablet 0   No facility-administered medications prior to visit.     ROS Review of Systems  Constitutional: Negative for diaphoresis, fatigue and unexpected weight change.  HENT: Negative.   Eyes: Negative for visual disturbance.  Respiratory: Negative for cough, chest tightness, shortness of breath and wheezing.   Cardiovascular: Negative for chest pain and leg swelling.  Gastrointestinal: Negative for abdominal pain, constipation, diarrhea, nausea and vomiting.  Endocrine: Negative for polydipsia, polyphagia and polyuria.  Genitourinary: Negative.  Negative for difficulty urinating.  Musculoskeletal: Negative.  Negative for arthralgias and myalgias.  Skin: Negative.  Negative for color change.  Neurological: Positive for headaches. Negative for dizziness, weakness, light-headedness and numbness.  Hematological: Negative for adenopathy. Does not bruise/bleed easily.  Psychiatric/Behavioral: Negative.     Objective:  BP (!) 176/102 (BP Location: Left Arm, Patient Position: Sitting, Cuff Size: Large)   Pulse 79   Temp 98.1 F (36.7 C) (Oral)   Resp 16   Ht _0  (1.778 m)   Wt (!) 323 lb 12 oz (146.9 kg)   SpO2 97%   BMI 46.45 kg/m   BP Readings from Last 3 Encounters:  09/25/18 (!) 176/102  09/12/18 (!) 162/98  08/22/18 (!) 154/82    Wt Readings from Last 3 Encounters:  09/25/18 (!) 323 lb 12 oz (146.9 kg)  09/12/18 (!) 324 lb (147 kg)  08/22/18 (!) 318 lb (144.2 kg)    Physical Exam  Constitutional: She is oriented to person, place, and time. No distress.  HENT:  Mouth/Throat: Oropharynx is clear and moist. No oropharyngeal exudate.  Eyes: Conjunctivae are normal. No scleral icterus.  Neck: Normal range of motion. Neck supple. No JVD present. No thyromegaly present.  Cardiovascular: Normal rate, regular rhythm and normal heart sounds. Exam reveals no gallop.  No murmur heard. Pulmonary/Chest: Effort normal and breath sounds normal. No  respiratory distress. She has no wheezes. She has no rales.  Abdominal: Soft. Normal appearance and bowel sounds are normal. She exhibits no mass. There is no hepatosplenomegaly. There is no tenderness.  Musculoskeletal: Normal range of motion. She exhibits no edema, tenderness or deformity.  There is no edema  Lymphadenopathy:    She has no cervical adenopathy.  Neurological: She is alert and oriented to person, place, and time.  Skin: Skin is warm and dry. She is not diaphoretic. No pallor.  Vitals reviewed.   Lab Results  Component Value Date   WBC 5.4 12/25/2017   HGB 13.6 12/25/2017   HCT 42.0 12/25/2017   PLT 230 12/25/2017   GLUCOSE 221 (H) 09/25/2018   CHOL 209 (H) 12/25/2017   TRIG 136.0 12/25/2017   HDL 65.50 12/25/2017   LDLDIRECT 134.8 08/27/2010   LDLCALC 117 (H) 12/25/2017   ALT 13 12/25/2017   AST 14 12/25/2017   NA 137 09/25/2018   K 3.7 09/25/2018   CL 100 09/25/2018   CREATININE 1.12 09/25/2018   BUN 18 09/25/2018   CO2 26 09/25/2018   TSH 2.68 12/25/2017   HGBA1C 7.9 (H) 09/25/2018   MICROALBUR 100.5 (H) 12/26/2017    US Venous Img Lower Unilateral Right  Result Date: 12/26/2017 CLINICAL DATA:  77 year old female with a history of right knee pain and swelling EXAM: RIGHT LOWER EXTREMITY VENOUS DOPPLER ULTRASOUND TECHNIQUE: Gray-scale sonography with graded compression, as well as color Doppler and duplex ultrasound were performed to evaluate the lower extremity deep venous systems from the level of the common femoral vein and including the common femoral, femoral, profunda femoral, popliteal and calf veins including the posterior tibial, peroneal and gastrocnemius veins when visible. The superficial great saphenous vein was also interrogated. Spectral Doppler was utilized to evaluate flow at rest and with distal augmentation maneuvers in the common femoral, femoral and popliteal veins. COMPARISON:  None. FINDINGS: Contralateral Common Femoral Vein: Respiratory  phasicity is normal and symmetric with the symptomatic side. No evidence of thrombus. Normal compressibility. Common Femoral Vein: No evidence of thrombus. Normal compressibility, respiratory phasicity and response to augmentation. Saphenofemoral Junction: No evidence of thrombus. Normal compressibility and flow on color Doppler imaging. Profunda Femoral Vein: No evidence of thrombus. Normal compressibility and flow on color Doppler imaging. Femoral Vein: No evidence of thrombus. Normal compressibility, respiratory phasicity and response to augmentation. Popliteal Vein: No evidence of thrombus. Normal compressibility, respiratory phasicity and response to augmentation. Calf Veins: No evidence of thrombus. Normal compressibility and flow on color Doppler imaging. Superficial Great Saphenous Vein: No evidence of thrombus. Normal compressibility and flow on color Doppler imaging. Other Findings: Lentiform fluid collection along the medial right knee, nonspecific. IMPRESSION: Sonographic survey of the right lower extremity negative for DVT. Lentiform fluid collection at the medial right knee, nonspecific though potentially Baker's cyst. Electronically Signed   By: Corrie Mckusick D.O.   On: 12/26/2017 14:55  Assessment & Plan:   Kinzey was seen today for hypertension and diabetes.  Diagnoses and all orders for this visit:  Essential hypertension- Her blood pressure is not adequately well controlled and she is symptomatic with a headache.  I have asked her to add an ARB and thiazide diuretic to the beta-blocker. -     Basic metabolic panel; Future -     Azilsartan-Chlorthalidone (EDARBYCLOR) 40-25 MG TABS; Take 1 tablet by mouth daily.  Type II diabetes mellitus with manifestations ([place])- Her A1c is up to 7.9%.  I have asked her to add an SGLT2 inhibitor to the metformin.  This may also help lower her blood pressure. -     Basic metabolic panel; Future -     Hemoglobin A1c; Future -      Azilsartan-Chlorthalidone (EDARBYCLOR) 40-25 MG TABS; Take 1 tablet by mouth daily. -     dapagliflozin propanediol (FARXIGA) 10 MG TABS tablet; Take 10 mg by mouth daily.  Need for Tdap vaccination -     Tdap vaccine greater than or equal to 7yo IM   I have discontinued [name]. Santor's furosemide and olmesartan-hydrochlorothiazide. I am also having her start on Azilsartan-Chlorthalidone and dapagliflozin propanediol. Additionally, I am having her maintain her aspirin, cetirizine, Cyanocobalamin (VITAMIN B-12 CR PO), Cholecalciferol, simvastatin, carvedilol, ACCU-CHEK AVIVA PLUS, ACCU-CHEK SOFTCLIX LANCETS, glucose blood, and metFORMIN.  Meds ordered this encounter  Medications  . Azilsartan-Chlorthalidone (EDARBYCLOR) 40-25 MG TABS    Sig: Take 1 tablet by mouth daily.    Dispense:  56 tablet    Refill:  0  . dapagliflozin propanediol (FARXIGA) 10 MG TABS tablet    Sig: Take 10 mg by mouth daily.    Dispense:  90 tablet    Refill:  1     Follow-up: No follow-ups on file.  Scarlette Calico, MD

## 2018-09-28 DIAGNOSIS — R2689 Other abnormalities of gait and mobility: Secondary | ICD-10-CM | POA: Diagnosis not present

## 2018-09-28 DIAGNOSIS — R269 Unspecified abnormalities of gait and mobility: Secondary | ICD-10-CM | POA: Diagnosis not present

## 2018-10-28 DIAGNOSIS — R2689 Other abnormalities of gait and mobility: Secondary | ICD-10-CM | POA: Diagnosis not present

## 2018-10-28 DIAGNOSIS — R269 Unspecified abnormalities of gait and mobility: Secondary | ICD-10-CM | POA: Diagnosis not present

## 2018-10-31 ENCOUNTER — Other Ambulatory Visit: Payer: Self-pay | Admitting: Internal Medicine

## 2018-11-21 ENCOUNTER — Other Ambulatory Visit: Payer: Self-pay | Admitting: Internal Medicine

## 2018-11-21 ENCOUNTER — Ambulatory Visit: Payer: Medicare PPO | Admitting: Internal Medicine

## 2018-11-21 ENCOUNTER — Other Ambulatory Visit (INDEPENDENT_AMBULATORY_CARE_PROVIDER_SITE_OTHER): Payer: Medicare PPO

## 2018-11-21 ENCOUNTER — Encounter: Payer: Self-pay | Admitting: Internal Medicine

## 2018-11-21 VITALS — BP 146/82 | HR 87 | Temp 98.2°F | Resp 18 | Ht 70.0 in | Wt 318.0 lb

## 2018-11-21 DIAGNOSIS — I10 Essential (primary) hypertension: Secondary | ICD-10-CM

## 2018-11-21 DIAGNOSIS — I70211 Atherosclerosis of native arteries of extremities with intermittent claudication, right leg: Secondary | ICD-10-CM

## 2018-11-21 DIAGNOSIS — E118 Type 2 diabetes mellitus with unspecified complications: Secondary | ICD-10-CM

## 2018-11-21 DIAGNOSIS — E1129 Type 2 diabetes mellitus with other diabetic kidney complication: Secondary | ICD-10-CM | POA: Diagnosis not present

## 2018-11-21 DIAGNOSIS — R809 Proteinuria, unspecified: Secondary | ICD-10-CM | POA: Diagnosis not present

## 2018-11-21 LAB — BASIC METABOLIC PANEL
BUN: 34 mg/dL — ABNORMAL HIGH (ref 6–23)
CO2: 24 mEq/L (ref 19–32)
Calcium: 9.7 mg/dL (ref 8.4–10.5)
Chloride: 101 mEq/L (ref 96–112)
Creatinine, Ser: 1.13 mg/dL (ref 0.40–1.20)
GFR: 60.28 mL/min (ref >60.00)
GLUCOSE: 214 mg/dL — AB (ref 70–99)
Potassium: 3.9 mEq/L (ref 3.5–5.1)
Sodium: 135 mEq/L (ref 135–145)

## 2018-11-21 MED ORDER — AZILSARTAN-CHLORTHALIDONE 40-25 MG PO TABS: 1.0000 | ORAL_TABLET | Freq: Every day | ORAL | 1 refills | Status: DC

## 2018-11-21 MED ORDER — CANAGLIFLOZIN 100 MG PO TABS: 100.0000 mg | ORAL_TABLET | Freq: Every day | ORAL | 1 refills | Status: DC

## 2018-11-21 NOTE — Patient Instructions (Signed)
Type 2 Diabetes Mellitus, Diagnosis, Adult Type 2 diabetes (type 2 diabetes mellitus) is a long-term (chronic) disease. In type 2 diabetes, one or both of these problems may be present:  The pancreas does not make enough of a hormone called insulin.  Cells in the body do not respond properly to insulin that the body makes (insulin resistance). Normally, insulin allows blood sugar (glucose) to enter cells in the body. The cells use glucose for energy. Insulin resistance or lack of insulin causes excess glucose to build up in the blood instead of going into cells. As a result, high blood glucose (hyperglycemia) develops. What increases the risk? The following factors may make you more likely to develop type 2 diabetes:  Having a family member with type 2 diabetes.  Being overweight or obese.  Having an inactive (sedentary) lifestyle.  Having been diagnosed with insulin resistance.  Having a history of prediabetes, gestational diabetes, or polycystic ovary syndrome (PCOS).  Being of American-Indian, African-American, Hispanic/Latino, or Asian/Pacific Islander descent. What are the signs or symptoms? In the early stage of this condition, you may not have symptoms. Symptoms develop slowly and may include:  Increased thirst (polydipsia).  Increased hunger(polyphagia).  Increased urination (polyuria).  Increased urination during the night (nocturia).  Unexplained weight loss.  Frequent infections that keep coming back (recurring).  Fatigue.  Weakness.  Vision changes, such as blurry vision.  Cuts or bruises that are slow to heal.  Tingling or numbness in the hands or feet.  Dark patches on the skin (acanthosis nigricans). How is this diagnosed? This condition is diagnosed based on your symptoms, your medical history, a physical exam, and your blood glucose level. Your blood glucose may be checked with one or more of the following blood tests:  A fasting blood glucose (FBG)  test. You will not be allowed to eat (you will fast) for 8 hours or longer before a blood sample is taken.  A random blood glucose test. This test checks blood glucose at any time of day regardless of when you ate.  An A1c (hemoglobin A1c) blood test. This test provides information about blood glucose control over the previous 2-3 months.  An oral glucose tolerance test (OGTT). This test measures your blood glucose at two times: ? After fasting. This is your baseline blood glucose level. ? Two hours after drinking a beverage that contains glucose. You may be diagnosed with type 2 diabetes if:  Your FBG level is 126 mg/dL (7.0 mmol/L) or higher.  Your random blood glucose level is 200 mg/dL (11.1 mmol/L) or higher.  Your A1c level is 6.5% or higher.  Your OGTT result is higher than 200 mg/dL (11.1 mmol/L). These blood tests may be repeated to confirm your diagnosis. How is this treated? Your treatment may be managed by a specialist called an endocrinologist. Type 2 diabetes may be treated by following instructions from your health care provider about:  Making diet and lifestyle changes. This may include: ? Following an individualized nutrition plan that is developed by a diet and nutrition specialist (registered dietitian). ? Exercising regularly. ? Finding ways to manage stress.  Checking your blood glucose level as often as told.  Taking diabetes medicines or insulin daily. This helps to keep your blood glucose levels in the healthy range. ? If you use insulin, you may need to adjust the dosage depending on how physically active you are and what foods you eat. Your health care provider will tell you how to adjust your dosage.    Taking medicines to help prevent complications from diabetes, such as: ? Aspirin. ? Medicine to lower cholesterol. ? Medicine to control blood pressure. Your health care provider will set individualized treatment goals for you. Your goals will be based on  your age, other medical conditions you have, and how you respond to diabetes treatment. Generally, the goal of treatment is to maintain the following blood glucose levels:  Before meals (preprandial): 80-130 mg/dL (4.4-7.2 mmol/L).  After meals (postprandial): below 180 mg/dL (10 mmol/L).  A1c level: less than 7%. Follow these instructions at home: Questions to ask your health care provider  Consider asking the following questions: ? Do I need to meet with a diabetes educator? ? Where can I find a support group for people with diabetes? ? What equipment will I need to manage my diabetes at home? ? What diabetes medicines do I need, and when should I take them? ? How often do I need to check my blood glucose? ? What number can I call if I have questions? ? When is my next appointment? General instructions  Take over-the-counter and prescription medicines only as told by your health care provider.  Keep all follow-up visits as told by your health care provider. This is important.  For more information about diabetes, visit: ? American Diabetes Association (ADA): www.diabetes.org ? American Association of Diabetes Educators (AADE): www.diabeteseducator.org Contact a health care provider if:  Your blood glucose is at or above 240 mg/dL (13.3 mmol/L) for 2 days in a row.  You have been sick or have had a fever for 2 days or longer, and you are not getting better.  You have any of the following problems for more than 6 hours: ? You cannot eat or drink. ? You have nausea and vomiting. ? You have diarrhea. Get help right away if:  Your blood glucose is lower than 54 mg/dL (3.0 mmol/L).  You become confused or you have trouble thinking clearly.  You have difficulty breathing.  You have moderate or large ketone levels in your urine. Summary  Type 2 diabetes (type 2 diabetes mellitus) is a long-term (chronic) disease. In type 2 diabetes, the pancreas does not make enough of a  hormone called insulin, or cells in the body do not respond properly to insulin that the body makes (insulin resistance).  This condition is treated by making diet and lifestyle changes and taking diabetes medicines or insulin.  Your health care provider will set individualized treatment goals for you. Your goals will be based on your age, other medical conditions you have, and how you respond to diabetes treatment.  Keep all follow-up visits as told by your health care provider. This is important. This information is not intended to replace advice given to you by your health care provider. Make sure you discuss any questions you have with your health care provider. Document Released: 10/31/2005 Document Revised: 06/01/2017 Document Reviewed: 12/04/2015 [employer] Interactive Patient Education  2019 [employer]  

## 2018-11-21 NOTE — Progress Notes (Signed)
Subjective:  Patient ID: Holly [name], female    DOB: [birth year]  Age: 78 y.o. MRN: [id number]  CC: Hypertension and Diabetes   HPI Holly [name] presents for f/up - She tells me that the lower extremity edema has resolved.  Her blood pressure is much better.  She is taking Lexicographer.  She is not taking [place] because her insurance would not pay for it.  Outpatient Medications Prior to Visit  Medication Sig Dispense Refill  . ACCU-CHEK SOFTCLIX LANCETS lancets 1 each by Other route 2 (two) times daily. Use to check blood sugars twice a day Dx e11.9 200 each 1  . aspirin 81 MG EC tablet Take 81 mg by mouth daily.      . Blood Glucose Monitoring Suppl (ACCU-CHEK AVIVA PLUS) w/Device KIT Use to check blood sugars daily Dx E11.9 1 kit 0  . carvedilol (COREG) 6.25 MG tablet TAKE 1 TABLET TWICE DAILY WITH MEALS 180 tablet 1  . cetirizine (ZYRTEC ALLERGY) 10 MG tablet Take 10 mg by mouth daily.      . Cholecalciferol 2000 units TABS Take 1 tablet (2,000 Units total) by mouth daily. 90 tablet 1  . Cyanocobalamin (VITAMIN B-12 CR PO) Take 1 tablet by mouth daily.    Marland Kitchen glucose blood (ACCU-CHEK AVIVA PLUS) test strip 1 each by Other route 2 (two) times daily. Use to check blood sugars twice a day Dx E11.9 200 each 1  . metFORMIN (GLUCOPHAGE) 1000 MG tablet TAKE 1 TABLET BY MOUTH TWICE DAILY WITH MEALS 180 tablet 1  . simvastatin (ZOCOR) 40 MG tablet TAKE 1 TABLET EVERY DAY 90 tablet 1  . Azilsartan-Chlorthalidone (EDARBYCLOR) 40-25 MG TABS Take 1 tablet by mouth daily. 56 tablet 0  . dapagliflozin propanediol (FARXIGA) 10 MG TABS tablet Take 10 mg by mouth daily. 90 tablet 1   No facility-administered medications prior to visit.     ROS Review of Systems  Constitutional: Negative for diaphoresis, fatigue and unexpected weight change.  HENT: Negative.   Eyes: Negative for visual disturbance.  Respiratory: Negative for cough, chest tightness, shortness of breath and wheezing.     Cardiovascular: Negative for chest pain, palpitations and leg swelling.  Gastrointestinal: Negative for abdominal pain, constipation, diarrhea, nausea and vomiting.  Endocrine: Negative for polydipsia, polyphagia and polyuria.  Genitourinary: Negative.  Negative for difficulty urinating.  Musculoskeletal: Negative.  Negative for arthralgias, back pain and myalgias.  Skin: Negative.  Negative for color change and rash.  Neurological: Negative.  Negative for dizziness, weakness, light-headedness and headaches.  Hematological: Negative for adenopathy. Does not bruise/bleed easily.  Psychiatric/Behavioral: Negative.     Objective:  BP (!) 146/82   Pulse 87   Temp 98.2 F (36.8 C) (Oral)   Resp 18   Ht '5\' 10"'  (1.778 m)   Wt (!) 318 lb (144.2 kg)   SpO2 98%   BMI 45.63 kg/m   BP Readings from Last 3 Encounters:  11/21/18 (!) 146/82  09/25/18 (!) 176/102  09/12/18 (!) 162/98    Wt Readings from Last 3 Encounters:  11/21/18 (!) 318 lb (144.2 kg)  09/25/18 (!) 323 lb 12 oz (146.9 kg)  09/12/18 (!) 324 lb (147 kg)    Physical Exam Vitals signs reviewed.  Constitutional:      Appearance: She is obese. She is not ill-appearing.  HENT:     Nose: Nose normal.     Mouth/Throat:     Mouth: Mucous membranes are moist.     Pharynx:  No oropharyngeal exudate or posterior oropharyngeal erythema.  Eyes:     General: No scleral icterus.    Conjunctiva/sclera: Conjunctivae normal.  Neck:     Musculoskeletal: Normal range of motion and neck supple. No muscular tenderness.  Cardiovascular:     Rate and Rhythm: Normal rate and regular rhythm.     Heart sounds: No murmur. No friction rub. No gallop.   Pulmonary:     Effort: Pulmonary effort is normal.     Breath sounds: No stridor. No wheezing, rhonchi or rales.  Abdominal:     General: Bowel sounds are normal.     Palpations: There is no hepatomegaly, splenomegaly or mass.     Tenderness: There is no abdominal tenderness.   Musculoskeletal: Normal range of motion.        General: No swelling.     Right lower leg: No edema.     Left lower leg: No edema.  Lymphadenopathy:     Cervical: No cervical adenopathy.  Skin:    General: Skin is warm and dry.     Coloration: Skin is not pale.     Findings: No rash.  Neurological:     General: No focal deficit present.     Mental Status: She is oriented to person, place, and time. Mental status is at baseline.     Lab Results  Component Value Date   WBC 5.4 12/25/2017   HGB 13.6 12/25/2017   HCT 42.0 12/25/2017   PLT 230 12/25/2017   GLUCOSE 214 (H) 11/21/2018   CHOL 209 (H) 12/25/2017   TRIG 136.0 12/25/2017   HDL 65.50 12/25/2017   LDLDIRECT 134.8 08/27/2010   LDLCALC 117 (H) 12/25/2017   ALT 13 12/25/2017   AST 14 12/25/2017   NA 135 11/21/2018   K 3.9 11/21/2018   CL 101 11/21/2018   CREATININE 1.13 11/21/2018   BUN 34 (H) 11/21/2018   CO2 24 11/21/2018   TSH 2.68 12/25/2017   HGBA1C 7.9 (H) 09/25/2018   MICROALBUR 100.5 (H) 12/26/2017    US Venous Img Lower Unilateral Right  Result Date: 12/26/2017 CLINICAL DATA:  78 year old female with a history of right knee pain and swelling EXAM: RIGHT LOWER EXTREMITY VENOUS DOPPLER ULTRASOUND TECHNIQUE: Gray-scale sonography with graded compression, as well as color Doppler and duplex ultrasound were performed to evaluate the lower extremity deep venous systems from the level of the common femoral vein and including the common femoral, femoral, profunda femoral, popliteal and calf veins including the posterior tibial, peroneal and gastrocnemius veins when visible. The superficial great saphenous vein was also interrogated. Spectral Doppler was utilized to evaluate flow at rest and with distal augmentation maneuvers in the common femoral, femoral and popliteal veins. COMPARISON:  None. FINDINGS: Contralateral Common Femoral Vein: Respiratory phasicity is normal and symmetric with the symptomatic side. No  evidence of thrombus. Normal compressibility. Common Femoral Vein: No evidence of thrombus. Normal compressibility, respiratory phasicity and response to augmentation. Saphenofemoral Junction: No evidence of thrombus. Normal compressibility and flow on color Doppler imaging. Profunda Femoral Vein: No evidence of thrombus. Normal compressibility and flow on color Doppler imaging. Femoral Vein: No evidence of thrombus. Normal compressibility, respiratory phasicity and response to augmentation. Popliteal Vein: No evidence of thrombus. Normal compressibility, respiratory phasicity and response to augmentation. Calf Veins: No evidence of thrombus. Normal compressibility and flow on color Doppler imaging. Superficial Great Saphenous Vein: No evidence of thrombus. Normal compressibility and flow on color Doppler imaging. Other Findings: Lentiform fluid collection along the  medial right knee, nonspecific. IMPRESSION: Sonographic survey of the right lower extremity negative for DVT. Lentiform fluid collection at the medial right knee, nonspecific though potentially Baker's cyst. Electronically Signed   By: Corrie Mckusick D.O.   On: 12/26/2017 14:55    Assessment & Plan:   Jahne was seen today for hypertension and diabetes.  Diagnoses and all orders for this visit:  Atherosclerosis of native artery of right lower extremity with intermittent claudication (HCC) -     Azilsartan-Chlorthalidone (EDARBYCLOR) 40-25 MG TABS; Take 1 tablet by mouth daily. -     canagliflozin (INVOKANA) 100 MG TABS tablet; Take 1 tablet (100 mg total) by mouth daily before breakfast.  Essential hypertension- Her blood pressure is adequately well controlled.  Will continue the current combination of an ARB and thiazide diuretic. -     Azilsartan-Chlorthalidone (EDARBYCLOR) 40-25 MG TABS; Take 1 tablet by mouth daily. -     Basic metabolic panel; Future  Type II diabetes mellitus with manifestations ([place])- Her blood sugars are not  adequately well controlled.  I have asked her to add Invokana to her current regimen to help lower her blood sugars. -     Azilsartan-Chlorthalidone (EDARBYCLOR) 40-25 MG TABS; Take 1 tablet by mouth daily. -     canagliflozin (INVOKANA) 100 MG TABS tablet; Take 1 tablet (100 mg total) by mouth daily before breakfast. -     Ambulatory referral to Ophthalmology -     Basic metabolic panel; Future  Microalbuminuria due to type 2 diabetes mellitus ([place])- I have asked her to start taking Invokana to reduce the risk of progression of diabetic kidney disease. -     canagliflozin (INVOKANA) 100 MG TABS tablet; Take 1 tablet (100 mg total) by mouth daily before breakfast.  Morbid obesity ([place])- She agrees to work on her lifestyle modifications to lose weight.   I have discontinued [name]. Ramos's dapagliflozin propanediol. I am also having her start on canagliflozin. Additionally, I am having her maintain her aspirin, cetirizine, Cyanocobalamin (VITAMIN B-12 CR PO), Cholecalciferol, carvedilol, ACCU-CHEK AVIVA PLUS, ACCU-CHEK SOFTCLIX LANCETS, glucose blood, metFORMIN, simvastatin, and Azilsartan-Chlorthalidone.  Meds ordered this encounter  Medications  . Azilsartan-Chlorthalidone (EDARBYCLOR) 40-25 MG TABS    Sig: Take 1 tablet by mouth daily.    Dispense:  90 tablet    Refill:  1  . canagliflozin (INVOKANA) 100 MG TABS tablet    Sig: Take 1 tablet (100 mg total) by mouth daily before breakfast.    Dispense:  90 tablet    Refill:  1     Follow-up: Return in about 3 months (around 02/20/2019).  Scarlette Calico, MD

## 2018-11-28 DIAGNOSIS — R2689 Other abnormalities of gait and mobility: Secondary | ICD-10-CM | POA: Diagnosis not present

## 2018-11-28 DIAGNOSIS — R269 Unspecified abnormalities of gait and mobility: Secondary | ICD-10-CM | POA: Diagnosis not present

## 2018-12-06 ENCOUNTER — Other Ambulatory Visit: Payer: Self-pay | Admitting: Internal Medicine

## 2018-12-06 DIAGNOSIS — E118 Type 2 diabetes mellitus with unspecified complications: Secondary | ICD-10-CM

## 2018-12-06 DIAGNOSIS — I10 Essential (primary) hypertension: Secondary | ICD-10-CM

## 2018-12-25 ENCOUNTER — Encounter: Payer: Self-pay | Admitting: Internal Medicine

## 2018-12-29 DIAGNOSIS — R2689 Other abnormalities of gait and mobility: Secondary | ICD-10-CM | POA: Diagnosis not present

## 2018-12-29 DIAGNOSIS — R269 Unspecified abnormalities of gait and mobility: Secondary | ICD-10-CM | POA: Diagnosis not present

## 2019-01-08 DIAGNOSIS — H5203 Hypermetropia, bilateral: Secondary | ICD-10-CM | POA: Diagnosis not present

## 2019-01-08 DIAGNOSIS — H25813 Combined forms of age-related cataract, bilateral: Secondary | ICD-10-CM | POA: Diagnosis not present

## 2019-01-08 LAB — HM DIABETES EYE EXAM

## 2019-01-17 ENCOUNTER — Other Ambulatory Visit: Payer: Self-pay | Admitting: Internal Medicine

## 2019-01-17 ENCOUNTER — Telehealth: Payer: Self-pay

## 2019-01-17 DIAGNOSIS — E118 Type 2 diabetes mellitus with unspecified complications: Secondary | ICD-10-CM

## 2019-01-17 DIAGNOSIS — I70211 Atherosclerosis of native arteries of extremities with intermittent claudication, right leg: Secondary | ICD-10-CM

## 2019-01-17 DIAGNOSIS — I10 Essential (primary) hypertension: Secondary | ICD-10-CM

## 2019-01-17 MED ORDER — AZILSARTAN-CHLORTHALIDONE 40-25 MG PO TABS: 1.0000 | ORAL_TABLET | Freq: Every day | ORAL | 0 refills | Status: DC

## 2019-01-17 NOTE — Telephone Encounter (Signed)
Requested Prescriptions  Pending Prescriptions Disp Refills  . Azilsartan-Chlorthalidone (EDARBYCLOR) 40-25 MG TABS 90 tablet 0    Sig: Take 1 tablet by mouth daily.     Cardiovascular: ARB + Diuretic Combos Failed - 01/17/2019  1:15 PM      Failed - Last BP in normal range    BP Readings from Last 1 Encounters:  11/21/18 (!) 146/82         Passed - K in normal range and within 180 days    Potassium  Date Value Ref Range Status  11/21/2018 3.9 3.5 - 5.1 mEq/L Final         Passed - Na in normal range and within 180 days    Sodium  Date Value Ref Range Status  11/21/2018 135 135 - 145 mEq/L Final         Passed - Cr in normal range and within 180 days    Creatinine, Ser  Date Value Ref Range Status  11/21/2018 1.13 0.40 - 1.20 mg/dL Final         Passed - Ca in normal range and within 180 days    Calcium  Date Value Ref Range Status  11/21/2018 9.7 8.4 - 10.5 mg/dL Final         Passed - Patient is not pregnant      Passed - Valid encounter within last 6 months    Recent Outpatient Visits          1 month ago Atherosclerosis of native artery of right lower extremity with intermittent claudication ([place])   [place], Thomas L, MD   3 months ago Essential hypertension   [place], Thomas L, MD   4 months ago Bilateral leg edema   [place], Claudina Lick, MD   8 months ago Essential hypertension   [place], Thomas L, MD   9 months ago Essential hypertension   [place], Thomas L, MD      Future Appointments            In 1 month Ronnald Ramp Arvid Right, MD [place], [place]   In 7 months  [place], [facility]         phone call to pt.; scheduled 3 mo. F/u appt. On 02/20/19

## 2019-01-17 NOTE — Telephone Encounter (Signed)
Copied from Emma [id number]. Topic: Quick Communication - Rx Refill/Question >> Jan 17, 2019  1:10 PM Keene Breath wrote: Medication: Azilsartan-Chlorthalidone (EDARBYCLOR) 40-25 MG TABS  Patient called to request a refill for the above medication  Preferred Pharmacy (with phone number or street name): [place], [place]. [phone] (Phone) [phone] (Fax)

## 2019-01-17 NOTE — Telephone Encounter (Signed)
Key: [id number]

## 2019-01-18 NOTE — Telephone Encounter (Signed)
PA has been approved

## 2019-01-21 NOTE — Telephone Encounter (Signed)
Informed patient

## 2019-01-22 ENCOUNTER — Other Ambulatory Visit: Payer: Self-pay | Admitting: Internal Medicine

## 2019-01-22 DIAGNOSIS — I10 Essential (primary) hypertension: Secondary | ICD-10-CM

## 2019-01-22 DIAGNOSIS — E118 Type 2 diabetes mellitus with unspecified complications: Secondary | ICD-10-CM

## 2019-01-22 DIAGNOSIS — I70211 Atherosclerosis of native arteries of extremities with intermittent claudication, right leg: Secondary | ICD-10-CM

## 2019-01-22 MED ORDER — LISINOPRIL-HYDROCHLOROTHIAZIDE 20-25 MG PO TABS: 1.0000 | ORAL_TABLET | Freq: Every day | ORAL | 1 refills | Status: DC

## 2019-01-22 NOTE — Telephone Encounter (Signed)
New RX sent  TJ 

## 2019-01-22 NOTE — Telephone Encounter (Signed)
Patient called her insurance and they gave her four alternative medications that are covered by her insurance in place for Edarbyclor    Amlodipine  Benazepril Enalapril Lisinopril   Jasmine with [employer]'s direct line is [phone] ext [phone] - if you need to speak with her. If you get her voicemail - just leave a message.

## 2019-01-22 NOTE — Telephone Encounter (Signed)
Spoke to pt and she stated that the Holly [name] is still too expensive even with the approval.   Alternatives are:  Amlodipine  Benazepril Enalapril Lisinopril    Please advise if it can be changed.

## 2019-01-27 DIAGNOSIS — R269 Unspecified abnormalities of gait and mobility: Secondary | ICD-10-CM | POA: Diagnosis not present

## 2019-01-27 DIAGNOSIS — R2689 Other abnormalities of gait and mobility: Secondary | ICD-10-CM | POA: Diagnosis not present

## 2019-02-20 ENCOUNTER — Ambulatory Visit: Payer: Medicare PPO | Admitting: Internal Medicine

## 2019-02-27 DIAGNOSIS — R269 Unspecified abnormalities of gait and mobility: Secondary | ICD-10-CM | POA: Diagnosis not present

## 2019-02-27 DIAGNOSIS — R2689 Other abnormalities of gait and mobility: Secondary | ICD-10-CM | POA: Diagnosis not present

## 2019-03-11 ENCOUNTER — Other Ambulatory Visit: Payer: Self-pay | Admitting: Internal Medicine

## 2019-03-14 ENCOUNTER — Other Ambulatory Visit: Payer: Self-pay | Admitting: Internal Medicine

## 2019-04-01 ENCOUNTER — Other Ambulatory Visit: Payer: Self-pay | Admitting: Internal Medicine

## 2019-04-01 DIAGNOSIS — H40003 Preglaucoma, unspecified, bilateral: Secondary | ICD-10-CM | POA: Diagnosis not present

## 2019-04-01 DIAGNOSIS — H25813 Combined forms of age-related cataract, bilateral: Secondary | ICD-10-CM | POA: Diagnosis not present

## 2019-04-01 DIAGNOSIS — E118 Type 2 diabetes mellitus with unspecified complications: Secondary | ICD-10-CM

## 2019-04-01 DIAGNOSIS — E119 Type 2 diabetes mellitus without complications: Secondary | ICD-10-CM | POA: Diagnosis not present

## 2019-04-01 MED ORDER — METFORMIN HCL 1000 MG PO TABS: 1000.0000 mg | ORAL_TABLET | Freq: Two times a day (BID) | ORAL | 0 refills | Status: DC

## 2019-04-01 MED ORDER — GLUCOSE BLOOD VI STRP: 1.0000 | ORAL_STRIP | Freq: Two times a day (BID) | 1 refills | Status: DC

## 2019-04-01 MED ORDER — ACCU-CHEK AVIVA PLUS W/DEVICE KIT: PACK | 0 refills | Status: AC

## 2019-04-01 MED ORDER — ACCU-CHEK SOFTCLIX LANCETS MISC: 1.0000 | Freq: Two times a day (BID) | 1 refills | Status: DC

## 2019-04-16 ENCOUNTER — Other Ambulatory Visit (INDEPENDENT_AMBULATORY_CARE_PROVIDER_SITE_OTHER): Payer: Medicare PPO

## 2019-04-16 ENCOUNTER — Encounter: Payer: Self-pay | Admitting: Internal Medicine

## 2019-04-16 ENCOUNTER — Ambulatory Visit (INDEPENDENT_AMBULATORY_CARE_PROVIDER_SITE_OTHER): Payer: Medicare PPO | Admitting: Internal Medicine

## 2019-04-16 ENCOUNTER — Other Ambulatory Visit: Payer: Self-pay

## 2019-04-16 VITALS — BP 142/84 | HR 67 | Temp 98.2°F | Resp 16 | Ht 70.0 in | Wt 309.0 lb

## 2019-04-16 DIAGNOSIS — I10 Essential (primary) hypertension: Secondary | ICD-10-CM

## 2019-04-16 DIAGNOSIS — E559 Vitamin D deficiency, unspecified: Secondary | ICD-10-CM | POA: Diagnosis not present

## 2019-04-16 DIAGNOSIS — E785 Hyperlipidemia, unspecified: Secondary | ICD-10-CM

## 2019-04-16 DIAGNOSIS — E118 Type 2 diabetes mellitus with unspecified complications: Secondary | ICD-10-CM

## 2019-04-16 LAB — LIPID PANEL
Cholesterol: 190 mg/dL (ref 0–200)
HDL: 62.5 mg/dL (ref >39.00)
LDL Cholesterol: 95 mg/dL (ref 0–99)
NonHDL: 127.02
Total CHOL/HDL Ratio: 3
Triglycerides: 160 mg/dL — ABNORMAL HIGH (ref 0.0–149.0)
VLDL: 32 mg/dL (ref 0.0–40.0)

## 2019-04-16 LAB — URINALYSIS, ROUTINE W REFLEX MICROSCOPIC
Bilirubin Urine: NEGATIVE
Ketones, ur: NEGATIVE
Nitrite: NEGATIVE
Specific Gravity, Urine: 1.02 (ref 1.000–1.030)
Total Protein, Urine: 100 — AB
Urine Glucose: >=1000 — AB
Urobilinogen, UA: 0.2 (ref 0.0–1.0)
pH: 5 (ref 5.0–8.0)

## 2019-04-16 LAB — COMPREHENSIVE METABOLIC PANEL
ALT: 10 U/L (ref 0–35)
AST: 11 U/L (ref 0–37)
Albumin: 4.3 g/dL (ref 3.5–5.2)
Alkaline Phosphatase: 79 U/L (ref 39–117)
BUN: 28 mg/dL — ABNORMAL HIGH (ref 6–23)
CO2: 26 mEq/L (ref 19–32)
Calcium: 9.9 mg/dL (ref 8.4–10.5)
Chloride: 102 mEq/L (ref 96–112)
Creatinine, Ser: 1.16 mg/dL (ref 0.40–1.20)
GFR: 54.97 mL/min — ABNORMAL LOW (ref >60.00)
Glucose, Bld: 173 mg/dL — ABNORMAL HIGH (ref 70–99)
Potassium: 4 mEq/L (ref 3.5–5.1)
Sodium: 138 mEq/L (ref 135–145)
Total Bilirubin: 1.2 mg/dL (ref 0.2–1.2)
Total Protein: 7.5 g/dL (ref 6.0–8.3)

## 2019-04-16 LAB — CBC WITH DIFFERENTIAL/PLATELET
Basophils Absolute: 0 10*3/uL (ref 0.0–0.1)
Basophils Relative: 0.7 % (ref 0.0–3.0)
Eosinophils Absolute: 0.1 10*3/uL (ref 0.0–0.7)
Eosinophils Relative: 1.1 % (ref 0.0–5.0)
HCT: 41.3 % (ref 36.0–46.0)
Hemoglobin: 13.6 g/dL (ref 12.0–15.0)
Lymphocytes Relative: 24.7 % (ref 12.0–46.0)
Lymphs Abs: 1.6 10*3/uL (ref 0.7–4.0)
MCHC: 33 g/dL (ref 30.0–36.0)
MCV: 84.9 fl (ref 78.0–100.0)
Monocytes Absolute: 0.4 10*3/uL (ref 0.1–1.0)
Monocytes Relative: 6.4 % (ref 3.0–12.0)
Neutro Abs: 4.4 10*3/uL (ref 1.4–7.7)
Neutrophils Relative %: 67.1 % (ref 43.0–77.0)
Platelets: 217 10*3/uL (ref 150.0–400.0)
RBC: 4.86 Mil/uL (ref 3.87–5.11)
RDW: 14 % (ref 11.5–15.5)
WBC: 6.5 10*3/uL (ref 4.0–10.5)

## 2019-04-16 LAB — VITAMIN D 25 HYDROXY (VIT D DEFICIENCY, FRACTURES): VITD: 30.03 ng/mL (ref 30.00–100.00)

## 2019-04-16 LAB — MICROALBUMIN / CREATININE URINE RATIO
Creatinine,U: 48.7 mg/dL
Microalb Creat Ratio: 124.5 mg/g — ABNORMAL HIGH (ref 0.0–30.0)
Microalb, Ur: 60.6 mg/dL — ABNORMAL HIGH (ref 0.0–1.9)

## 2019-04-16 LAB — TSH: TSH: 2.44 u[IU]/mL (ref 0.35–4.50)

## 2019-04-16 LAB — HEMOGLOBIN A1C: Hgb A1c MFr Bld: 8.3 % — ABNORMAL HIGH (ref 4.6–6.5)

## 2019-04-16 MED ORDER — INSULIN PEN NEEDLE 32G X 6 MM MISC: 1.0000 | Freq: Every day | 3 refills | Status: DC

## 2019-04-16 MED ORDER — INSULIN GLARGINE (2 UNIT DIAL) 300 UNIT/ML ~~LOC~~ SOPN: 30.0000 [IU] | PEN_INJECTOR | Freq: Every day | SUBCUTANEOUS | 1 refills | Status: DC

## 2019-04-16 NOTE — Patient Instructions (Signed)
Type 2 Diabetes Mellitus, Diagnosis, Adult Type 2 diabetes (type 2 diabetes mellitus) is a long-term (chronic) disease. In type 2 diabetes, one or both of these problems may be present:  The pancreas does not make enough of a hormone called insulin.  Cells in the body do not respond properly to insulin that the body makes (insulin resistance). Normally, insulin allows blood sugar (glucose) to enter cells in the body. The cells use glucose for energy. Insulin resistance or lack of insulin causes excess glucose to build up in the blood instead of going into cells. As a result, high blood glucose (hyperglycemia) develops. What increases the risk? The following factors may make you more likely to develop type 2 diabetes:  Having a family member with type 2 diabetes.  Being overweight or obese.  Having an inactive (sedentary) lifestyle.  Having been diagnosed with insulin resistance.  Having a history of prediabetes, gestational diabetes, or polycystic ovary syndrome (PCOS).  Being of American-Indian, African-American, Hispanic/Latino, or Asian/Pacific Islander descent. What are the signs or symptoms? In the early stage of this condition, you may not have symptoms. Symptoms develop slowly and may include:  Increased thirst (polydipsia).  Increased hunger(polyphagia).  Increased urination (polyuria).  Increased urination during the night (nocturia).  Unexplained weight loss.  Frequent infections that keep coming back (recurring).  Fatigue.  Weakness.  Vision changes, such as blurry vision.  Cuts or bruises that are slow to heal.  Tingling or numbness in the hands or feet.  Dark patches on the skin (acanthosis nigricans). How is this diagnosed? This condition is diagnosed based on your symptoms, your medical history, a physical exam, and your blood glucose level. Your blood glucose may be checked with one or more of the following blood tests:  A fasting blood glucose (FBG)  test. You will not be allowed to eat (you will fast) for 8 hours or longer before a blood sample is taken.  A random blood glucose test. This test checks blood glucose at any time of day regardless of when you ate.  An A1c (hemoglobin A1c) blood test. This test provides information about blood glucose control over the previous 2-3 months.  An oral glucose tolerance test (OGTT). This test measures your blood glucose at two times: ? After fasting. This is your baseline blood glucose level. ? Two hours after drinking a beverage that contains glucose. You may be diagnosed with type 2 diabetes if:  Your FBG level is 126 mg/dL (7.0 mmol/L) or higher.  Your random blood glucose level is 200 mg/dL (11.1 mmol/L) or higher.  Your A1c level is 6.5% or higher.  Your OGTT result is higher than 200 mg/dL (11.1 mmol/L). These blood tests may be repeated to confirm your diagnosis. How is this treated? Your treatment may be managed by a specialist called an endocrinologist. Type 2 diabetes may be treated by following instructions from your health care provider about:  Making diet and lifestyle changes. This may include: ? Following an individualized nutrition plan that is developed by a diet and nutrition specialist (registered dietitian). ? Exercising regularly. ? Finding ways to manage stress.  Checking your blood glucose level as often as told.  Taking diabetes medicines or insulin daily. This helps to keep your blood glucose levels in the healthy range. ? If you use insulin, you may need to adjust the dosage depending on how physically active you are and what foods you eat. Your health care provider will tell you how to adjust your dosage.    Taking medicines to help prevent complications from diabetes, such as: ? Aspirin. ? Medicine to lower cholesterol. ? Medicine to control blood pressure. Your health care provider will set individualized treatment goals for you. Your goals will be based on  your age, other medical conditions you have, and how you respond to diabetes treatment. Generally, the goal of treatment is to maintain the following blood glucose levels:  Before meals (preprandial): 80-130 mg/dL (4.4-7.2 mmol/L).  After meals (postprandial): below 180 mg/dL (10 mmol/L).  A1c level: less than 7%. Follow these instructions at home: Questions to ask your health care provider  Consider asking the following questions: ? Do I need to meet with a diabetes educator? ? Where can I find a support group for people with diabetes? ? What equipment will I need to manage my diabetes at home? ? What diabetes medicines do I need, and when should I take them? ? How often do I need to check my blood glucose? ? What number can I call if I have questions? ? When is my next appointment? General instructions  Take over-the-counter and prescription medicines only as told by your health care provider.  Keep all follow-up visits as told by your health care provider. This is important.  For more information about diabetes, visit: ? American Diabetes Association (ADA): www.diabetes.org ? American Association of Diabetes Educators (AADE): www.diabeteseducator.org Contact a health care provider if:  Your blood glucose is at or above 240 mg/dL (13.3 mmol/L) for 2 days in a row.  You have been sick or have had a fever for 2 days or longer, and you are not getting better.  You have any of the following problems for more than 6 hours: ? You cannot eat or drink. ? You have nausea and vomiting. ? You have diarrhea. Get help right away if:  Your blood glucose is lower than 54 mg/dL (3.0 mmol/L).  You become confused or you have trouble thinking clearly.  You have difficulty breathing.  You have moderate or large ketone levels in your urine. Summary  Type 2 diabetes (type 2 diabetes mellitus) is a long-term (chronic) disease. In type 2 diabetes, the pancreas does not make enough of a  hormone called insulin, or cells in the body do not respond properly to insulin that the body makes (insulin resistance).  This condition is treated by making diet and lifestyle changes and taking diabetes medicines or insulin.  Your health care provider will set individualized treatment goals for you. Your goals will be based on your age, other medical conditions you have, and how you respond to diabetes treatment.  Keep all follow-up visits as told by your health care provider. This is important. This information is not intended to replace advice given to you by your health care provider. Make sure you discuss any questions you have with your health care provider. Document Released: 10/31/2005 Document Revised: 06/01/2017 Document Reviewed: 12/04/2015 [employer] Interactive Patient Education  2019 [employer]  

## 2019-04-16 NOTE — Progress Notes (Signed)
Subjective:  Patient ID: Holly [name], female    DOB: 04/25/1941  Age: 78 y.o. MRN: [id number]  CC: Hypertension; Hyperlipidemia; and Diabetes   HPI LORETHA URE presents for f/up - She complains that her blood sugars are not adequately well controlled.  She has been able to lose weight by decreasing her caloric intake.  She is not able to exercise due to chronic knee pain.  When she is active she denies CP, DOE, palpitations, edema, or fatigue.  Outpatient Medications Prior to Visit  Medication Sig Dispense Refill  . Accu-Chek Softclix Lancets lancets 1 each by Other route 2 (two) times daily. Use to check blood sugars twice a day Dx e11.9 200 each 1  . aspirin 81 MG EC tablet Take 81 mg by mouth daily.      . Blood Glucose Monitoring Suppl (ACCU-CHEK AVIVA PLUS) w/Device KIT Use to check blood sugars daily Dx E11.9 1 kit 0  . canagliflozin (INVOKANA) 100 MG TABS tablet Take 1 tablet (100 mg total) by mouth daily before breakfast. 90 tablet 1  . carvedilol (COREG) 6.25 MG tablet TAKE 1 TABLET TWICE DAILY WITH MEALS 180 tablet 1  . cetirizine (ZYRTEC ALLERGY) 10 MG tablet Take 10 mg by mouth daily.      . Cholecalciferol 2000 units TABS Take 1 tablet (2,000 Units total) by mouth daily. 90 tablet 1  . Cyanocobalamin (VITAMIN B-12 CR PO) Take 1 tablet by mouth daily.    Marland Kitchen glucose blood (ACCU-CHEK AVIVA PLUS) test strip 1 each by Other route 2 (two) times daily. Use to check blood sugars twice a day Dx E11.9 200 each 1  . lisinopril-hydrochlorothiazide (PRINZIDE,ZESTORETIC) 20-25 MG tablet Take 1 tablet by mouth daily. 90 tablet 1  . metFORMIN (GLUCOPHAGE) 1000 MG tablet Take 1 tablet (1,000 mg total) by mouth 2 (two) times daily with a meal. 180 tablet 0  . simvastatin (ZOCOR) 40 MG tablet TAKE 1 TABLET EVERY DAY 90 tablet 1   No facility-administered medications prior to visit.     ROS Review of Systems  Constitutional: Negative for diaphoresis, fatigue and unexpected weight  change.  HENT: Negative.   Eyes: Negative for visual disturbance.  Respiratory: Negative for cough, chest tightness, shortness of breath and wheezing.   Cardiovascular: Negative for chest pain, palpitations and leg swelling.  Gastrointestinal: Negative for abdominal pain, diarrhea and nausea.  Endocrine: Positive for polydipsia and polyuria. Negative for polyphagia.  Genitourinary: Negative.  Negative for decreased urine volume, difficulty urinating and dysuria.  Musculoskeletal: Positive for arthralgias and back pain. Negative for myalgias.       Chronic, unchanged, nonradiating low back pain.  Skin: Negative.  Negative for color change, pallor and rash.  Neurological: Negative.  Negative for dizziness, weakness, light-headedness and headaches.  Hematological: Negative for adenopathy. Does not bruise/bleed easily.  Psychiatric/Behavioral: Negative.     Objective:  BP (!) 142/84 (BP Location: Left Arm, Patient Position: Sitting, Cuff Size: Large)   Pulse 67   Temp 98.2 F (36.8 C) (Oral)   Resp 16   Ht _0  (1.778 m)   Wt (!) 309 lb (140.2 kg)   SpO2 98%   BMI 44.34 kg/m   BP Readings from Last 3 Encounters:  04/16/19 (!) 142/84  11/21/18 (!) 146/82  09/25/18 (!) 176/102    Wt Readings from Last 3 Encounters:  04/16/19 (!) 309 lb (140.2 kg)  11/21/18 (!) 318 lb (144.2 kg)  09/25/18 (!) 323 lb 12 oz (146.9 kg)  Physical Exam Vitals signs reviewed.  Constitutional:      Appearance: She is obese. She is not ill-appearing or diaphoretic.  HENT:     Nose: Nose normal. No congestion.     Mouth/Throat:     Pharynx: No oropharyngeal exudate.  Eyes:     General: No scleral icterus.    Conjunctiva/sclera: Conjunctivae normal.  Neck:     Musculoskeletal: Normal range of motion. No neck rigidity.  Cardiovascular:     Rate and Rhythm: Normal rate and regular rhythm.     Pulses:          Carotid pulses are 1+ on the right side and 1+ on the left side.      Radial pulses  are 1+ on the right side and 1+ on the left side.       Femoral pulses are 1+ on the right side and 1+ on the left side.      Popliteal pulses are 1+ on the right side and 1+ on the left side.       Dorsalis pedis pulses are 1+ on the right side and 1+ on the left side.       Posterior tibial pulses are 1+ on the right side and 1+ on the left side.     Heart sounds: Murmur present. No diastolic murmur. No gallop.   Pulmonary:     Effort: No respiratory distress.     Breath sounds: No stridor. No wheezing, rhonchi or rales.  Abdominal:     General: Abdomen is protuberant.     Palpations: There is no hepatomegaly or splenomegaly.     Tenderness: There is no abdominal tenderness.  Musculoskeletal:     Right lower leg: No edema.     Left lower leg: No edema.  Lymphadenopathy:     Cervical: No cervical adenopathy.  Skin:    General: Skin is warm.  Neurological:     General: No focal deficit present.     Mental Status: Mental status is at baseline.  Psychiatric:        Mood and Affect: Mood normal.        Behavior: Behavior normal.     Lab Results  Component Value Date   WBC 6.5 04/16/2019   HGB 13.6 04/16/2019   HCT 41.3 04/16/2019   PLT 217.0 04/16/2019   GLUCOSE 173 (H) 04/16/2019   CHOL 190 04/16/2019   TRIG 160.0 (H) 04/16/2019   HDL 62.50 04/16/2019   LDLDIRECT 134.8 08/27/2010   LDLCALC 95 04/16/2019   ALT 10 04/16/2019   AST 11 04/16/2019   NA 138 04/16/2019   K 4.0 04/16/2019   CL 102 04/16/2019   CREATININE 1.16 04/16/2019   BUN 28 (H) 04/16/2019   CO2 26 04/16/2019   TSH 2.44 04/16/2019   HGBA1C 8.3 (H) 04/16/2019   MICROALBUR 60.6 (H) 04/16/2019    US Venous Img Lower Unilateral Right  Result Date: 12/26/2017 CLINICAL DATA:  78 year old female with a history of right knee pain and swelling EXAM: RIGHT LOWER EXTREMITY VENOUS DOPPLER ULTRASOUND TECHNIQUE: Gray-scale sonography with graded compression, as well as color Doppler and duplex ultrasound were  performed to evaluate the lower extremity deep venous systems from the level of the common femoral vein and including the common femoral, femoral, profunda femoral, popliteal and calf veins including the posterior tibial, peroneal and gastrocnemius veins when visible. The superficial great saphenous vein was also interrogated. Spectral Doppler was utilized to evaluate flow at rest and with  distal augmentation maneuvers in the common femoral, femoral and popliteal veins. COMPARISON:  None. FINDINGS: Contralateral Common Femoral Vein: Respiratory phasicity is normal and symmetric with the symptomatic side. No evidence of thrombus. Normal compressibility. Common Femoral Vein: No evidence of thrombus. Normal compressibility, respiratory phasicity and response to augmentation. Saphenofemoral Junction: No evidence of thrombus. Normal compressibility and flow on color Doppler imaging. Profunda Femoral Vein: No evidence of thrombus. Normal compressibility and flow on color Doppler imaging. Femoral Vein: No evidence of thrombus. Normal compressibility, respiratory phasicity and response to augmentation. Popliteal Vein: No evidence of thrombus. Normal compressibility, respiratory phasicity and response to augmentation. Calf Veins: No evidence of thrombus. Normal compressibility and flow on color Doppler imaging. Superficial Great Saphenous Vein: No evidence of thrombus. Normal compressibility and flow on color Doppler imaging. Other Findings: Lentiform fluid collection along the medial right knee, nonspecific. IMPRESSION: Sonographic survey of the right lower extremity negative for DVT. Lentiform fluid collection at the medial right knee, nonspecific though potentially Baker's cyst. Electronically Signed   By: Corrie Mckusick D.O.   On: 12/26/2017 14:55    Assessment & Plan:   Yenty was seen today for hypertension, hyperlipidemia and diabetes.  Diagnoses and all orders for this visit:  Essential hypertension-  Considering her age and comorbid illnesses her blood pressure is adequately well controlled.  Electrolytes and renal function are normal. -     Comprehensive metabolic panel; Future -     CBC with Differential/Platelet; Future -     TSH; Future -     Urinalysis, Routine w reflex microscopic; Future  Type II diabetes mellitus with manifestations ([place]) - Her A1c is up to 8.3% and she has poly's.  I have asked her to continue taking the oral glycemic agents and to add basal insulin. -     Comprehensive metabolic panel; Future -     Hemoglobin A1c; Future -     Microalbumin / creatinine urine ratio; Future -     HM Diabetes Foot Exam -     Amb Referral to Nutrition and Diabetic E -     Consult to [facility] Care Management -     Insulin Pen Needle (NOVOFINE) 32G X 6 MM MISC; 1 Act by Does not apply route daily. -     Insulin Glargine, 2 Unit Dial, (TOUJEO MAX SOLOSTAR) 300 UNIT/ML SOPN; Inject 30 Units into the skin daily.  Hyperlipidemia with target LDL less than 100- She has achieved her LDL goal and is doing well on the statin. -     Comprehensive metabolic panel; Future -     Lipid panel; Future -     TSH; Future  Vitamin D deficiency- Her vitamin D level is in the normal range. -     VITAMIN D 25 Hydroxy (Vit-D Deficiency, Fractures); Future   I am having [name]. Elpidio Eric start on Insulin Pen Needle and Insulin Glargine (2 Unit Dial). I am also having her maintain her aspirin, cetirizine, Cyanocobalamin (VITAMIN B-12 CR PO), Cholecalciferol, simvastatin, canagliflozin, carvedilol, lisinopril-hydrochlorothiazide, Accu-Chek Aviva Plus, Accu-Chek Softclix Lancets, glucose blood, and metFORMIN.  Meds ordered this encounter  Medications  . Insulin Pen Needle (NOVOFINE) 32G X 6 MM MISC    Sig: 1 Act by Does not apply route daily.    Dispense:  100 each    Refill:  3  . Insulin Glargine, 2 Unit Dial, (TOUJEO MAX SOLOSTAR) 300 UNIT/ML SOPN    Sig: Inject 30 Units into the skin daily.  Dispense:  9 mL    Refill:  1     Follow-up: Return in about 6 months (around 10/16/2019).  Scarlette Calico, MD

## 2019-04-17 ENCOUNTER — Other Ambulatory Visit: Payer: Self-pay | Admitting: Internal Medicine

## 2019-05-03 ENCOUNTER — Other Ambulatory Visit: Payer: Self-pay

## 2019-05-03 NOTE — Patient Outreach (Signed)
[facility]) Care Management  05/03/2019  PATRICIANN BECHT [birth year] [id number]  TELEPHONE SCREENING Referral date: 04/16/19 Referral source: primary MD Referral reason: diabetes Insurance: [employer]  Telephone call to patient regarding primary MD referral. HIPAA verified. RNCM introduced herself and explained reason for call. RNCM discussed and offered [facility] care management services. Patient verbally agreed to follow up for her diabetes. Patient states, " I need to get my diabetes under control."  Patient states she also has back pain and has to use a cane.  Patient states she has had diabetes for 6-7 years.  She states she had a follow up visit with her primary MD on 04/16/19. She reports she had to start insulin 1 week after her visit with her primary on 04/16/19.   Patient state her blood sugars range from 85 to 195.  She states she is unsure what her A1c is and does not know what an A1c is.  RNCM explained A1C to patient. Discussed with patient her A1c lab result for 04/16/19 was 8.3 per her chart.  Patient verbally agreed to having follow up with [facility] care management RN health coach.   PLAN: RNCM will refer patient to health coach.   Quinn Plowman RN,BSN,CCM [facility] Telephonic  [phone]

## 2019-05-07 DIAGNOSIS — M48061 Spinal stenosis, lumbar region without neurogenic claudication: Secondary | ICD-10-CM | POA: Diagnosis not present

## 2019-05-08 ENCOUNTER — Other Ambulatory Visit: Payer: Self-pay | Admitting: *Deleted

## 2019-05-16 ENCOUNTER — Other Ambulatory Visit: Payer: Self-pay | Admitting: Internal Medicine

## 2019-05-16 DIAGNOSIS — I10 Essential (primary) hypertension: Secondary | ICD-10-CM

## 2019-05-16 DIAGNOSIS — E1129 Type 2 diabetes mellitus with other diabetic kidney complication: Secondary | ICD-10-CM

## 2019-05-16 DIAGNOSIS — E118 Type 2 diabetes mellitus with unspecified complications: Secondary | ICD-10-CM

## 2019-05-16 DIAGNOSIS — I70211 Atherosclerosis of native arteries of extremities with intermittent claudication, right leg: Secondary | ICD-10-CM

## 2019-05-28 ENCOUNTER — Encounter: Payer: Self-pay | Admitting: *Deleted

## 2019-05-28 ENCOUNTER — Other Ambulatory Visit: Payer: Self-pay | Admitting: *Deleted

## 2019-05-28 NOTE — Patient Outreach (Signed)
[facility]) Care Management  [place]  05/28/2019   Holly [name] December 23, 1940 [id number]   RN Health Coach Initial Assessment  Referral Date:  05/03/2019 Referral Source:  Primary MD Reason for Referral:  Disease Management Education Insurance:  [employer] Medicare   Outreach Attempt:  Successful telephone outreach to patient for introduction and initial telephone assessment.  HIPAA verified with patient.  RN Health Coach introduced self and role.  Patient verbally agrees to Disease Management outreaches.  Patient completed initial telephone assessment.  Social:  Patient lives at home with brother.  Reports being independent with ADLs and IADLs.  Ambulates with cane and walker for long distances and denies any falls in the past year.  Transports herself to medical appointments.  DME in the home include:  Rolator walker, straight cane, CBG meter, eyeglasses, blood pressure cuff, scale, and grab bar in shower.  Conditions:   Per chart review and discussion with patient, PMH include but not limited to:  Hypertension, hyperlipidemia, diabetes, lumbar radiculopathy, atherosclerosis of lower extremities, and obstructive sleep apnea.  Patient reporting she has chronic back pain/leg pain that gets worse with movement.  Uses cane at times and walker for long distances.  Has purchased lift chair in the home to assist with getting up.  Monitors blood pressure daily that ranges 130/80's.  Monitors blood sugars about twice a day (before or after breakfast and after dinnertime).  Fasting blood sugar this morning was 140 with fasting ranges of 140-160's.  Last Hgb A1C is 8.3 on 04/16/2019.  Denies any recent emergency room visits or hospitalizations.  Medications:  Patient reporting taking about 8 medications.  Manages medications herself with weekly pill box fills without difficulties.  Does report trouble affording some of her medications.  States her prescription for Megan Salon is $500  for a 3 month supply, for which she cannot afford.  Has a few days worth of pills to take.  Encouraged patient to contact primary care provider for possible samples.  Patient also reporting her Nelva Nay is costing $98 for 2 pens, difficult to afford.  States she is on her second pen currently.  Discussed [place] referral for medication assistance and patient verbally agrees.  Encounter Medications:  Outpatient Encounter Medications as of 05/28/2019  Medication Sig Note  . Accu-Chek Softclix Lancets lancets 1 each by Other route 2 (two) times daily. Use to check blood sugars twice a day Dx e11.9   . aspirin 81 MG EC tablet Take 81 mg by mouth daily.     . Blood Glucose Monitoring Suppl (ACCU-CHEK AVIVA PLUS) w/Device KIT Use to check blood sugars daily Dx E11.9   . carvedilol (COREG) 6.25 MG tablet TAKE 1 TABLET TWICE DAILY WITH MEALS   . cetirizine (ZYRTEC ALLERGY) 10 MG tablet Take 10 mg by mouth daily.   05/28/2019: Takes as needed  . Cyanocobalamin (VITAMIN B-12 CR PO) Take 1 tablet by mouth daily.   Marland Kitchen glucose blood (ACCU-CHEK AVIVA PLUS) test strip 1 each by Other route 2 (two) times daily. Use to check blood sugars twice a day Dx E11.9   . Insulin Glargine, 2 Unit Dial, (TOUJEO MAX SOLOSTAR) 300 UNIT/ML SOPN Inject 30 Units into the skin daily.   . Insulin Pen Needle (NOVOFINE) 32G X 6 MM MISC 1 Act by Does not apply route daily.   . INVOKANA 100 MG TABS tablet TAKE 1 TABLET (100 MG TOTAL) BY MOUTH DAILY BEFORE BREAKFAST. 05/28/2019: Only has a few days left of  prescription  . lisinopril-hydrochlorothiazide (PRINZIDE,ZESTORETIC) 20-25 MG tablet Take 1 tablet by mouth daily.   . metFORMIN (GLUCOPHAGE) 1000 MG tablet Take 1 tablet (1,000 mg total) by mouth 2 (two) times daily with a meal.   . simvastatin (ZOCOR) 40 MG tablet TAKE 1 TABLET EVERY DAY   . Cholecalciferol 2000 units TABS Take 1 tablet (2,000 Units total) by mouth daily. (Patient not taking: Reported on 05/28/2019) 05/28/2019: No  longer taking   No facility-administered encounter medications on file as of 05/28/2019.     Functional Status:  In your present state of health, do you have any difficulty performing the following activities: 05/28/2019 08/22/2018  Hearing? N N  Vision? Y N  Comment floaters in left eye -  Difficulty concentrating or making decisions? N N  Walking or climbing stairs? Y Y  Comment trouble walking due to spinal stenosis -  Dressing or bathing? N N  Doing errands, shopping? N Y  Conservation officer, nature and eating ? N N  Using the Toilet? N N  In the past six months, have you accidently leaked urine? Y N  Comment history of overactive bladder, wears incontinence pads -  Do you have problems with loss of bowel control? N N  Managing your Medications? N N  Managing your Finances? N N  Housekeeping or managing your Housekeeping? N N  Some recent data might be hidden    Fall/Depression Screening: Fall Risk  05/28/2019 08/22/2018 08/14/2017  Falls in the past year? 0 No No  Risk for fall due to : Medication side effect Impaired balance/gait;Impaired mobility;History of fall(s) Impaired balance/gait;Impaired mobility  Risk for fall due to: Comment - patient feels imbalanced especially in outside environment -  Follow up Falls evaluation completed;Education provided;Falls prevention discussed - -   PHQ 2/9 Scores 05/28/2019 05/03/2019 08/22/2018 08/14/2017 03/07/2017 10/21/2015 04/20/2015  PHQ - 2 Score 0 0 0 0 0 0 0  PHQ- 9 Score - - - 0 - - -   THN CM Care Plan Problem One     Most Recent Value  Care Plan Problem One  Knowledge deficiet related to self care management of diabetes.  Role Documenting the Problem One  [place] for Problem One  Active  [facility] Long Term Goal   Patient will report a decrease in Hgb A1C by 0.3 points in the next 90 days.  THN Long Term Goal Start Date  05/28/19  Interventions for Problem One Long Term Goal  Current care plan reviewed and discussed with patient,  encouraged to keep and attend scheduled medical appointments, reviewed medications and encouraged medication compliance, [place] consulted for medication assistance, encouraged patient to contact PCP office for medication samples while awaiting to apply for medication assistance, encouraged and reviewed low carbohydrate food and drink options, discussed current Hgb A1C and ways to reduce, encouraged patient to discuss with PCP A1C goal, discussed importance of glycemic control, fall precautions and preventions reviewed and discussed     Appointments:  Patient attended appointment with primary care provider, Dr. Ronnald Ramp on 04/16/2019 and has scheduled follow up appointment on 10/16/2019.  Advanced Directives:  Patient reports having an Living Will and [place] in place and does not wish to make any changes to Advance Directive at this time.   Consent:  [facility] services reviewed and discussed.  Patient verbally agrees to Disease Management outreaches and [place] referral for medication assistance.  Plan: RN Health Coach will send primary MD barriers  letter. RN Health Coach will route initial telephone assessment note to primary MD. [place] will send patient [place]. RN Health Coach will send patient 2020 Calendar Booklet. RN Health Coach will send patient Living Well with Diabetes Educational Booklet. RN Health Coach will make [place] referral for assistance with affording medications. RN Health Coach will make next telephone outreach to patient within the month of August.  Holly Uram RN [place] [phone] Holly [name].Holly [name]_0 .Holly [name]

## 2019-05-30 ENCOUNTER — Telehealth: Payer: Self-pay | Admitting: Pharmacist

## 2019-05-30 NOTE — Patient Outreach (Signed)
[facility]) Care Management  05/30/2019  Holly [name] January 24, 1941 [id number]   Patient was called regarding medication assistance with Invokana and Toujeo. Unfortunately, she did not answer the phone. HIPAA compliant message was left on her mobile voicemail. Patient's home phone rang >20 times without an answer and did not have a voicemail set up.  Call patient back in 5-7 business days. Send unsuccessful contact letter.  Elayne Guerin, PharmD, [place] Clinical Pharmacist [phone]

## 2019-06-10 ENCOUNTER — Ambulatory Visit: Payer: Self-pay | Admitting: Pharmacist

## 2019-06-10 ENCOUNTER — Other Ambulatory Visit: Payer: Self-pay | Admitting: Pharmacist

## 2019-06-10 NOTE — Patient Outreach (Addendum)
[facility]) Care Management  06/10/2019  Holly [name] [birth year] [id number]  Patient was called regarding medication assistance with Invokana and Toujeo. Unfortunately, she did not answer the phone. HIPAA compliant message was left on her mobile voicemail. Patient's home phone rang >20 times without an answer and did not have a voicemail set up.  Call patient back in 7-10 business days. Unsuccessful contact letter sent 05/30/2019.   Elayne Guerin, PharmD, [place] Clinical Pharmacist [phone]

## 2019-06-19 ENCOUNTER — Other Ambulatory Visit: Payer: Self-pay | Admitting: *Deleted

## 2019-06-19 ENCOUNTER — Other Ambulatory Visit: Payer: Self-pay | Admitting: Pharmacist

## 2019-06-19 NOTE — Patient Outreach (Signed)
[facility]) Care Management  06/19/2019  KAHLEE METIVIER 05-Nov-1941 [id number]   RN Health Coach Medication Assistance Question  Referral Date:  05/03/2019 Referral Source:  Primary MD Reason for Referral:  Disease Management Education Insurance:  [facility] Medicare   Outreach Attempt:  Received voicemail message from patient concerning medication assistance.  Successful telephone outreach to patient to return telephone call.  HIPAA verified with patient.  Patient reporting she still needs assistance with medication cost.  Discussed with patient, [facility] Pharmacist Alwyn Ren has been trying to reach her.  Patient states she typically does not answer calls from unknown unidentified numbers.  RN Health Coach provided patient with [place] telephone number and encouraged patient to give her a call.  Also discussed that [place] would be assisting in the process and encouraged patient to answer calls from [facility] or [place].  Plan:  RN Health Coach will make next telephone outreach to patient within the month of August as previously scheduled.  [place] [phone] [email or web address]

## 2019-06-19 NOTE — Patient Outreach (Signed)
[facility]) Care Management  [place]   06/19/2019  Holly [name] Jun 11, 1941 [id number]  Reason for referral: Medication Assistance  Referral source: [facility] RN Current insurance: Humana  PMHx includes but not limited to:   Atherosclerosis, hyperlipidemia, hypertension, OSA, morbid obesity, osteoarthritis, type 2 diabetes, and vitamin D deficiency  Outreach:  Successful telephone call with patient.  HIPAA identifiers verified.   Subjective:  Patient called me back in reference to medication assistance. She is a 78 year old female with the medical conditions listed above. She reported Invokana had a copay of >$400 and Toujeo max had a copay of approx $190.   Objective: The 10-year ASCVD risk score [name], et al., 2013) is: 38.8%   Values used to calculate the score:     Age: 30 years     Sex: Female     Is Non-Hispanic African American: Yes     Diabetic: Yes     Tobacco smoker: No     Systolic Blood Pressure: 754 mmHg     Is BP treated: Yes     HDL Cholesterol: 62.5 mg/dL     Total Cholesterol: 190 mg/dL  Lab Results  Component Value Date   CREATININE 1.16 04/16/2019   CREATININE 1.13 11/21/2018   CREATININE 1.12 09/25/2018    Lab Results  Component Value Date   HGBA1C 8.3 (H) 04/16/2019    Lipid Panel     Component Value Date/Time   CHOL 190 04/16/2019 0940   TRIG 160.0 (H) 04/16/2019 0940   HDL 62.50 04/16/2019 0940   CHOLHDL 3 04/16/2019 0940   VLDL 32.0 04/16/2019 0940   LDLCALC 95 04/16/2019 0940   LDLDIRECT 134.8 08/27/2010 1138    BP Readings from Last 3 Encounters:  04/16/19 (!) 142/84  11/21/18 (!) 146/82  09/25/18 (!) 176/102    Allergies  Allergen Reactions  . Amlodipine Other (See Comments)    edema  . Oxycodone     nausea  . Penicillins Hives and Other (See Comments)    fever    Medications Reviewed Today    Reviewed by [name], RN (Registered Nurse) on 05/28/19 at 1144  Med List Status:  <None>  Medication Order Taking? Sig Documenting Provider Last Dose Status Informant  Accu-Chek Softclix Lancets lancets [id number] Yes 1 each by Other route 2 (two) times daily. Use to check blood sugars twice a day Dx e11.9 [name], MD Taking Active   aspirin 81 MG EC tablet [id number] Yes Take 81 mg by mouth daily.   [provider] Taking Active Self  Blood Glucose Monitoring Suppl (ACCU-CHEK AVIVA PLUS) w/Device KIT [id number] Yes Use to check blood sugars daily Dx E11.9 [name], MD Taking Active   carvedilol (COREG) 6.25 MG tablet [id number] Yes TAKE 1 TABLET TWICE DAILY WITH MEALS [name], MD Taking Active   cetirizine (ZYRTEC ALLERGY) 10 MG tablet [id number] Yes Take 10 mg by mouth daily.   [provider] Taking Active Self           Med Note [name], [facility] D   Tue May 28, 2019 11:41 AM) Takes as needed  Cholecalciferol 2000 units TABS [id number] No Take 1 tablet (2,000 Units total) by mouth daily.  Patient not taking: Reported on 05/28/2019   [name], MD Not Taking Active            Med Note [name] [facility] D   Tue May 28, 2019 11:41 AM)  No longer taking  Cyanocobalamin (VITAMIN B-12 CR PO) [id number] Yes Take 1 tablet by mouth daily. [provider] Taking Active Self  glucose blood (ACCU-CHEK AVIVA PLUS) test strip [id number] Yes 1 each by Other route 2 (two) times daily. Use to check blood sugars twice a day Dx E11.9 [name], MD Taking Active   Insulin Glargine, 2 Unit Dial, (TOUJEO MAX SOLOSTAR) 300 UNIT/ML SOPN [id number] Yes Inject 30 Units into the skin daily. [name], MD Taking Active   Insulin Pen Needle (NOVOFINE) 32G X 6 MM MISC [id number] Yes 1 Act by Does not apply route daily. [name], MD Taking Active   INVOKANA 100 MG TABS tablet [id number] Yes TAKE 1 TABLET (100 MG TOTAL) BY MOUTH DAILY BEFORE BREAKFAST. [name], MD Taking Active            Med Note [name], [facility] D   Tue May 28, 2019  11:43 AM) Only has a few days left of prescription  lisinopril-hydrochlorothiazide (PRINZIDE,ZESTORETIC) 20-25 MG tablet [id number] Yes Take 1 tablet by mouth daily. [name], MD Taking Active   metFORMIN (GLUCOPHAGE) 1000 MG tablet [id number] Yes Take 1 tablet (1,000 mg total) by mouth 2 (two) times daily with a meal. [name], MD Taking Active   simvastatin (ZOCOR) 40 MG tablet [id number] Yes TAKE 1 TABLET EVERY DAY [name], MD Taking Active           Assessment: Drugs sorted by system:  Cardiovascular:  Aspirin, Carvedilol, Simvastatin  Pulmonary/Allergy: Cetirizine  Endocrine: Toujeo,  Invokana, Metformin  Vitamins/Minerals/Supplements: Cholecalciferol, Cyanocobalamin  Medication Review Findings:  . HgA1c- 8.3% o Metformin 1000 mg last filled 05/18/19 & 03/11/19 o Invokana filled 02/05/19 for $90 day supply (patient said she cannot afford to pick it up) o On statin -(simvastatin) last filled 04/19/19 and 01/18/19 o Addition of bolus insulin therapy at the largest meal could be an option to help increase glycemic control .  Humalog could be obtained from the [employer] along with [employer]  Medication Assistance Findings:  Medication assistance needs identified: Invokana and Toujeo Max  Extra Help:  Not eligible for Extra Help Low Income Subsidy based on reported income and assets  Patient Assistance Programs: Invokana made by [place] requirement met: Yes o Out-of-pocket prescription expenditure met:   No (3-4% of annual household income) - Patient has not met application requirements to apply for this program at this time.  - Alternative option is to apply for a similar product in the same therapeutic class which does not have a required out of pocket expenditure.  A possible substitution is  Jardiance  made by [employer] .    Toujeo Max  made by [employer] requirement met: Yes o Out-of-pocket prescription  expenditure met:   No ($1000) - Patient has not met application requirements to apply for this program at this time.  - Alternative option is to apply for a similar product in the same therapeutic class which does not have a required out of pocket expenditure.  A possible substitution is  Engineer, agricultural  made by [employer] .    Additional medication assistance options reviewed with patient as warranted:  No other options identified  Plan: . Will route note to patient's PCP about making therapeutic substitutions.  . Will follow-up in 5-7 business days.Elayne Guerin, PharmD, [place] Clinical Pharmacist [phone]

## 2019-06-21 ENCOUNTER — Other Ambulatory Visit: Payer: Self-pay | Admitting: Internal Medicine

## 2019-06-21 DIAGNOSIS — E118 Type 2 diabetes mellitus with unspecified complications: Secondary | ICD-10-CM

## 2019-06-21 MED ORDER — JARDIANCE 25 MG PO TABS: 25.0000 mg | ORAL_TABLET | Freq: Every day | ORAL | 1 refills | Status: DC

## 2019-06-21 MED ORDER — BASAGLAR KWIKPEN 100 UNIT/ML ~~LOC~~ SOPN: 30.0000 [IU] | PEN_INJECTOR | Freq: Every day | SUBCUTANEOUS | 1 refills | Status: DC

## 2019-06-25 ENCOUNTER — Other Ambulatory Visit: Payer: Self-pay | Admitting: Pharmacist

## 2019-06-25 ENCOUNTER — Ambulatory Visit: Payer: Self-pay | Admitting: Pharmacist

## 2019-06-25 ENCOUNTER — Other Ambulatory Visit: Payer: Self-pay | Admitting: Pharmacy Technician

## 2019-06-25 NOTE — Patient Outreach (Signed)
[facility]) Care Management  06/25/2019  [name] Apr 11, 1941 [id number]                           Medication Assistance Referral  Referral From: [place]  Medication/Company: Judene Companion Patient application portion:  Mailed Provider application portion: Faxed  to Dr Scarlette Calico  Medication/Company: Vania Rea / BI Patient application portion:  Mailed Provider application portion: Faxed  to Dr Scarlette Calico    Follow up:  Will follow up with patient in 5-10 business days to confirm application(s) have been received.  Tyrease Vandeberg P. Evea Sheek, [place] Management [phone]

## 2019-06-25 NOTE — Patient Outreach (Signed)
[facility]) Care Management  06/25/2019  Holly [name] 14-Jan-1941 [id number]   Patient was called to follow up on medication assistance. Unfortunately, she did not answer the phone. HIPAA compliant message was left on her mobile voicemail. A voicemail did not pick up for the primary number listed in her chart.  A note was sent to her provider to inquire about switching Toujeo max to Lantus and Invokana to Jardiance due to eligibility issues. Dr. Ronnald Ramp agreed to both therapeutic switches.    Patient will be sent medication assistance forms for both Jardiance and Basaglar.   Plan:  Susy Frizzle, CPhT will follow up with the patient. Pharmacist will follow up in 4-6 weeks.   Elayne Guerin, PharmD, [place] Clinical Pharmacist [phone]

## 2019-06-26 ENCOUNTER — Other Ambulatory Visit: Payer: Self-pay | Admitting: Internal Medicine

## 2019-06-28 ENCOUNTER — Encounter: Payer: Self-pay | Admitting: *Deleted

## 2019-06-28 ENCOUNTER — Telehealth: Payer: Self-pay | Admitting: Internal Medicine

## 2019-06-28 ENCOUNTER — Other Ambulatory Visit: Payer: Self-pay | Admitting: *Deleted

## 2019-06-28 NOTE — Telephone Encounter (Signed)
Pt called and requesting samples of invokina. Pt states the price is too much for her to pay. Pt is requesting samples for a few weeks until she can figure out if it can be covered by [place]. Pt also states her insulin, toujeomax, has increased in price as well. Please advise.

## 2019-06-28 NOTE — Patient Outreach (Signed)
[facility]) Care Management  06/28/2019  Holly [name] 1941/03/06 [id number]   RN Health Coach Monthly Outreach  Referral Date:  05/03/2019 Referral Source:  Primary MD Reason for Referral:  Disease Management Education Insurance:  [employer] Medicare   Outreach Attempt:  Successful telephone outreach to patient for follow up.  HIPAA verified with patient.  Patient reporting she is doing well.  Monitors blood sugars twice a day, after breakfast and before dinner.  Blood sugars have been running 130's.  Denies any hypo or hyperglycemic episodes.  Patient reporting she is still out of Toujeo and will run out of Invokana this week; and can not afford to refill either prescription.  Is working with [place] for medication assistance and is awaiting applications to come in the mail.  Encouraged patient to contact this RN Health Coach or Pharmacist if she did not receive applications by the middle of next week.  Also, encouraged patient to contact primary care provider's office to request samples of her medication.  Stated she would do so.  Appointments:  Last attended appointment with primary care provider on 04/16/2019.  Plan: RN Health Coach will make next telephone outreach to patient within the month of September.  [place] Coach [phone] [name]

## 2019-07-02 NOTE — Telephone Encounter (Signed)
Spoke to pt and reviewed medication list.   I have printed forms for patient assistance to help with the cost of her medications.

## 2019-07-08 ENCOUNTER — Other Ambulatory Visit: Payer: Self-pay | Admitting: Pharmacy Technician

## 2019-07-08 NOTE — Patient Outreach (Signed)
[place] [facility]) Care Management  07/08/2019  Holly [name] [birth year] [id number]  Unsuccessful outreach call placed to patient in regards to [facility] application for [place] application for [employer].  Unfortunately patient did not answer the phone, HIPAA compliant voicemail left on mobile number as the option was given on the home number.  Will followup with 2nd outreach attempt in 5-10 business days if call is not returned.  [name] P. Takashi Korol, [place] Management [phone]

## 2019-07-15 ENCOUNTER — Other Ambulatory Visit: Payer: Self-pay | Admitting: Pharmacy Technician

## 2019-07-15 NOTE — Patient Outreach (Signed)
[facility]) Care Management  07/15/2019  Holly [name] [birth year] [id number]   Successful outreach call placed to patient in regards to [facility] application for [place] application for [employer].This was my 2nd overall call.  Spoke to patient, HIPAA identifiers verified.  Patient informed she had received the applications but had not mailed them back in because she does not have the documentation that is required.  Informed patient that the medication assistance companies require proof of income. Informed patient that if she did not file taxes or does not have her social security statements then she had 2 options. She could call the [facility] and request they send her a letter indicating what her payments are monthly or she could send in a copy of her bank statement that shows the bank name (bank letterhead)  with the deposits from social security. Patient verbalized understanding.  Patient informed she would work on that and get it sent in.  Will followup with patient in 10-15 business days if not received back.  Seraphim Trow P. Macallister Ash[place]ad, [place] Management [phone]

## 2019-07-31 ENCOUNTER — Other Ambulatory Visit: Payer: Self-pay | Admitting: Pharmacy Technician

## 2019-07-31 NOTE — Patient Outreach (Signed)
[facility]) Care Management  07/31/2019  IZOLA NETTER 02-03-1941 [id number]   Unsuccessful outreach call placed to patient in regards to [facility] application for [place] application for [employer].  Unfortunately patient did not answer the phone, HIPAA compliant voicemail left on mobile number as the home number said enter remote access code after 15-20 rings.Today was my 3rd call attempt to patient.  Was calling patient to inquire if she had mailed back the applications.  Will route note to [place] that pharmacy medication assistance case is being closed due to not being able to maintain contact with patient with the option to re open if applications are received. Will remove myself from care team.  Luiz Ochoa. Syna Gad, [place] Management [phone]

## 2019-08-01 ENCOUNTER — Ambulatory Visit: Payer: Self-pay | Admitting: *Deleted

## 2019-08-01 ENCOUNTER — Other Ambulatory Visit: Payer: Self-pay | Admitting: *Deleted

## 2019-08-01 ENCOUNTER — Telehealth: Payer: Self-pay | Admitting: Pharmacist

## 2019-08-01 NOTE — Patient Outreach (Signed)
[facility]) Care Management  08/01/2019  Holly [name] 1941/10/14 [id number]   Patient was called to follow up on patient assistance forms. Unfortunately, she did not answer her phone. HIPAA compliant message was left on her voicemail.  [place], Susy Frizzle, has tried (unsuccessfully) on several occasions to reach the patient to see if she received the applications that were mailed to her.  Plan: Send patient an unsuccessful outreach letter. Call patient back in 10-14 business days. If patient cannot be reached, close her case.  Elayne Guerin, PharmD, [place] Clinical Pharmacist [phone]

## 2019-08-01 NOTE — Patient Outreach (Signed)
[facility]) Care Management  08/01/2019  Holly [name] 1941-06-26 [id number]   [place] attempted follow up outreach call to patient for Holly [name].  RN Health Coach called home number and no answer or voice mail pick up. RN called mobile number. Patient was unavailable. HIPPA compliance voicemail message left with return callback number.  Plan: RN will call patient again within 30 days.   [place] Coach [phone] [email or web address]

## 2019-08-05 ENCOUNTER — Other Ambulatory Visit (INDEPENDENT_AMBULATORY_CARE_PROVIDER_SITE_OTHER): Payer: Medicare PPO

## 2019-08-05 ENCOUNTER — Ambulatory Visit (INDEPENDENT_AMBULATORY_CARE_PROVIDER_SITE_OTHER): Payer: Medicare PPO | Admitting: Internal Medicine

## 2019-08-05 ENCOUNTER — Encounter: Payer: Self-pay | Admitting: Internal Medicine

## 2019-08-05 ENCOUNTER — Other Ambulatory Visit: Payer: Self-pay

## 2019-08-05 VITALS — BP 164/94 | HR 81 | Temp 98.7°F | Resp 16 | Ht 70.0 in | Wt 310.0 lb

## 2019-08-05 DIAGNOSIS — E118 Type 2 diabetes mellitus with unspecified complications: Secondary | ICD-10-CM

## 2019-08-05 DIAGNOSIS — I10 Essential (primary) hypertension: Secondary | ICD-10-CM

## 2019-08-05 DIAGNOSIS — Z23 Encounter for immunization: Secondary | ICD-10-CM | POA: Diagnosis not present

## 2019-08-05 LAB — BASIC METABOLIC PANEL
BUN: 20 mg/dL (ref 6–23)
CO2: 27 mEq/L (ref 19–32)
Calcium: 9.9 mg/dL (ref 8.4–10.5)
Chloride: 100 mEq/L (ref 96–112)
Creatinine, Ser: 0.95 mg/dL (ref 0.40–1.20)
GFR: 69.16 mL/min (ref >60.00)
Glucose, Bld: 207 mg/dL — ABNORMAL HIGH (ref 70–99)
Potassium: 3.8 mEq/L (ref 3.5–5.1)
Sodium: 137 mEq/L (ref 135–145)

## 2019-08-05 LAB — HEMOGLOBIN A1C: Hgb A1c MFr Bld: 7.7 % — ABNORMAL HIGH (ref 4.6–6.5)

## 2019-08-05 MED ORDER — CANAGLIFLOZIN 100 MG PO TABS: 100.0000 mg | ORAL_TABLET | Freq: Every day | ORAL | 0 refills | Status: DC

## 2019-08-05 MED ORDER — LANTUS SOLOSTAR 100 UNIT/ML ~~LOC~~ SOPN: 30.0000 [IU] | PEN_INJECTOR | Freq: Every day | SUBCUTANEOUS | 1 refills | Status: DC

## 2019-08-05 NOTE — Patient Instructions (Signed)

## 2019-08-05 NOTE — Progress Notes (Signed)
Subjective:  Patient ID: Holly [name], female    DOB: [birth year]  Age: 78 y.o. MRN: [id number]  CC: Hypertension and Diabetes   HPI DASHAWN GOLDA presents for f/up - She complains that Jardiance and Nancee Liter are too expensive.  She is not able to exercise and has therefore not been able to lose weight.  She denies chest pain, shortness of breath, edema, or polys.  Outpatient Medications Prior to Visit  Medication Sig Dispense Refill   Accu-Chek Softclix Lancets lancets 1 each by Other route 2 (two) times daily. Use to check blood sugars twice a day Dx e11.9 200 each 1   Blood Glucose Monitoring Suppl (ACCU-CHEK AVIVA PLUS) w/Device KIT Use to check blood sugars daily Dx E11.9 1 kit 0   carvedilol (COREG) 6.25 MG tablet TAKE 1 TABLET TWICE DAILY WITH MEALS 180 tablet 1   cetirizine (ZYRTEC ALLERGY) 10 MG tablet Take 10 mg by mouth daily.       Cholecalciferol 2000 units TABS Take 1 tablet (2,000 Units total) by mouth daily. 90 tablet 1   Cyanocobalamin (VITAMIN B-12 CR PO) Take 1 tablet by mouth daily.     glucose blood (ACCU-CHEK AVIVA PLUS) test strip 1 each by Other route 2 (two) times daily. Use to check blood sugars twice a day Dx E11.9 200 each 1   Insulin Pen Needle (NOVOFINE) 32G X 6 MM MISC 1 Act by Does not apply route daily. 100 each 3   lisinopril-hydrochlorothiazide (PRINZIDE,ZESTORETIC) 20-25 MG tablet Take 1 tablet by mouth daily. 90 tablet 1   metFORMIN (GLUCOPHAGE) 1000 MG tablet Take 1 tablet (1,000 mg total) by mouth 2 (two) times daily with a meal. 180 tablet 0   simvastatin (ZOCOR) 40 MG tablet TAKE 1 TABLET EVERY DAY 90 tablet 1   aspirin 81 MG EC tablet Take 81 mg by mouth daily.       empagliflozin (JARDIANCE) 25 MG TABS tablet Take 25 mg by mouth daily. 90 tablet 1   Insulin Glargine (BASAGLAR KWIKPEN) 100 UNIT/ML SOPN Inject 0.3 mLs (30 Units total) into the skin daily. 9 mL 1   No facility-administered medications prior to visit.      ROS Review of Systems  Constitutional: Negative for diaphoresis, fatigue and unexpected weight change.  HENT: Negative.   Eyes: Negative for visual disturbance.  Respiratory: Negative for cough, chest tightness, shortness of breath and wheezing.   Cardiovascular: Negative for chest pain, palpitations and leg swelling.  Gastrointestinal: Negative for abdominal pain, constipation, diarrhea, nausea and vomiting.  Endocrine: Negative.  Negative for polydipsia, polyphagia and polyuria.  Genitourinary: Negative.  Negative for difficulty urinating and dysuria.  Musculoskeletal: Negative.  Negative for arthralgias and myalgias.  Skin: Negative.  Negative for color change and pallor.  Neurological: Negative.  Negative for dizziness, weakness and light-headedness.  Hematological: Negative for adenopathy. Does not bruise/bleed easily.  Psychiatric/Behavioral: Negative.     Objective:  BP (!) 164/94 (BP Location: Left Arm, Patient Position: Sitting, Cuff Size: Large)    Pulse 81    Temp 98.7 F (37.1 C) (Oral)    Resp 16    Ht '5\' 10"'  (1.778 m)    Wt (!) 310 lb (140.6 kg)    SpO2 97%    BMI 44.48 kg/m   BP Readings from Last 3 Encounters:  08/05/19 (!) 164/94  04/16/19 (!) 142/84  11/21/18 (!) 146/82    Wt Readings from Last 3 Encounters:  08/05/19 (!) 310 lb (140.6 kg)  04/16/19 (!) 309 lb (140.2 kg)  11/21/18 (!) 318 lb (144.2 kg)    Physical Exam Vitals signs reviewed.  Constitutional:      Appearance: Normal appearance. She is obese.  HENT:     Nose: Nose normal.     Mouth/Throat:     Mouth: Mucous membranes are moist.     Pharynx: No oropharyngeal exudate.  Eyes:     General: No scleral icterus.    Conjunctiva/sclera: Conjunctivae normal.  Neck:     Musculoskeletal: Normal range of motion. No muscular tenderness.  Cardiovascular:     Rate and Rhythm: Normal rate and regular rhythm.     Heart sounds: No murmur.  Pulmonary:     Effort: Pulmonary effort is normal.      Breath sounds: No stridor. No wheezing, rhonchi or rales.  Abdominal:     General: Abdomen is protuberant. Bowel sounds are normal.     Palpations: There is no hepatomegaly or splenomegaly.     Tenderness: There is no abdominal tenderness.  Musculoskeletal: Normal range of motion.     Right lower leg: No edema.     Left lower leg: No edema.  Skin:    General: Skin is warm and dry.  Neurological:     General: No focal deficit present.     Mental Status: She is alert.  Psychiatric:        Mood and Affect: Mood normal.        Behavior: Behavior normal.     Lab Results  Component Value Date   WBC 6.5 04/16/2019   HGB 13.6 04/16/2019   HCT 41.3 04/16/2019   PLT 217.0 04/16/2019   GLUCOSE 207 (H) 08/05/2019   CHOL 190 04/16/2019   TRIG 160.0 (H) 04/16/2019   HDL 62.50 04/16/2019   LDLDIRECT 134.8 08/27/2010   LDLCALC 95 04/16/2019   ALT 10 04/16/2019   AST 11 04/16/2019   NA 137 08/05/2019   K 3.8 08/05/2019   CL 100 08/05/2019   CREATININE 0.95 08/05/2019   BUN 20 08/05/2019   CO2 27 08/05/2019   TSH 2.44 04/16/2019   HGBA1C 7.7 (H) 08/05/2019   MICROALBUR 60.6 (H) 04/16/2019    US Venous Img Lower Unilateral Right  Result Date: 12/26/2017 CLINICAL DATA:  78 year old female with a history of right knee pain and swelling EXAM: RIGHT LOWER EXTREMITY VENOUS DOPPLER ULTRASOUND TECHNIQUE: Gray-scale sonography with graded compression, as well as color Doppler and duplex ultrasound were performed to evaluate the lower extremity deep venous systems from the level of the common femoral vein and including the common femoral, femoral, profunda femoral, popliteal and calf veins including the posterior tibial, peroneal and gastrocnemius veins when visible. The superficial great saphenous vein was also interrogated. Spectral Doppler was utilized to evaluate flow at rest and with distal augmentation maneuvers in the common femoral, femoral and popliteal veins. COMPARISON:  None. FINDINGS:  Contralateral Common Femoral Vein: Respiratory phasicity is normal and symmetric with the symptomatic side. No evidence of thrombus. Normal compressibility. Common Femoral Vein: No evidence of thrombus. Normal compressibility, respiratory phasicity and response to augmentation. Saphenofemoral Junction: No evidence of thrombus. Normal compressibility and flow on color Doppler imaging. Profunda Femoral Vein: No evidence of thrombus. Normal compressibility and flow on color Doppler imaging. Femoral Vein: No evidence of thrombus. Normal compressibility, respiratory phasicity and response to augmentation. Popliteal Vein: No evidence of thrombus. Normal compressibility, respiratory phasicity and response to augmentation. Calf Veins: No evidence of thrombus. Normal compressibility and flow  on color Doppler imaging. Superficial Great Saphenous Vein: No evidence of thrombus. Normal compressibility and flow on color Doppler imaging. Other Findings: Lentiform fluid collection along the medial right knee, nonspecific. IMPRESSION: Sonographic survey of the right lower extremity negative for DVT. Lentiform fluid collection at the medial right knee, nonspecific though potentially Baker's cyst. Electronically Signed   By: Corrie Mckusick D.O.   On: 12/26/2017 14:55    Assessment & Plan:   Sherryl was seen today for hypertension and diabetes.  Diagnoses and all orders for this visit:  Essential hypertension- Her blood pressure is adequately well controlled.  Electrolytes and renal function are normal. -     Cancel: Basic metabolic panel; Future -     Basic metabolic panel; Future  Type II diabetes mellitus with manifestations ([place])- Her blood sugars have improved with an A1c down to 7.7%.  I will change her to a generic basal insulin and I gave her samples of an SGLT2 inhibitor. -     Cancel: Basic metabolic panel; Future -     Cancel: Hemoglobin A1c; Future -     Insulin Glargine (LANTUS SOLOSTAR) 100 UNIT/ML Solostar  Pen; Inject 30 Units into the skin daily. -     canagliflozin (INVOKANA) 100 MG TABS tablet; Take 1 tablet (100 mg total) by mouth daily before breakfast. -     Basic metabolic panel; Future -     Hemoglobin A1c; Future  Need for influenza vaccination -     Flu Vaccine QUAD High Dose(Fluad)  Need for pneumococcal vaccination -     Pneumococcal polysaccharide vaccine 23-valent greater than or equal to 2yo subcutaneous/IM   I have discontinued [name]. Anastacio's aspirin, Basaglar KwikPen, and Jardiance. I am also having her start on Lantus SoloStar and canagliflozin. Additionally, I am having her maintain her cetirizine, Cyanocobalamin (VITAMIN B-12 CR PO), Cholecalciferol, lisinopril-hydrochlorothiazide, Accu-Chek Aviva Plus, Accu-Chek Softclix Lancets, glucose blood, metFORMIN, Insulin Pen Needle, simvastatin, and carvedilol.  Meds ordered this encounter  Medications   Insulin Glargine (LANTUS SOLOSTAR) 100 UNIT/ML Solostar Pen    Sig: Inject 30 Units into the skin daily.    Dispense:  9 mL    Refill:  1   canagliflozin (INVOKANA) 100 MG TABS tablet    Sig: Take 1 tablet (100 mg total) by mouth daily before breakfast.    Dispense:  126 tablet    Refill:  0     Follow-up: Return in about 3 months (around 11/04/2019).  Scarlette Calico, MD

## 2019-08-15 ENCOUNTER — Other Ambulatory Visit: Payer: Self-pay | Admitting: Internal Medicine

## 2019-08-15 DIAGNOSIS — E118 Type 2 diabetes mellitus with unspecified complications: Secondary | ICD-10-CM

## 2019-08-19 ENCOUNTER — Other Ambulatory Visit: Payer: Self-pay | Admitting: Pharmacist

## 2019-08-19 NOTE — Patient Outreach (Signed)
[place] [facility]) Care Management  08/19/2019  Holly [name] [birth year] [id number]   Patient was called to follow up on medication assistance. HIPAA identifiers were obtained.  Patient confirmed she received the patient assistance forms we sent her.  She said she does not want to pursue patient assistance at this time because she didn't think she would qualify. She was reminded that we complete a financial assessment before sending out applications but she still said she was not interested in applying.  Patient said the nurse at her provider's office recommended that she call her insurance about getting a better plan for next year.  Patient was educated about LIS and informed that the donut hole will still be in exsistence next year. For 2021 plan year, patients will reach the donut hole when the spend $4130. With medications like Basaglar and Invokana, she may still hit the donut hole for 2021.  Patient was adamant that she did not want to continue the patient assistance process.  She is working with [employer].  Plan: Close pharmacy case. Route note to Fate.  Elayne Guerin, PharmD, [place] Clinical Pharmacist [phone]

## 2019-08-26 ENCOUNTER — Other Ambulatory Visit: Payer: Self-pay | Admitting: Internal Medicine

## 2019-08-26 DIAGNOSIS — E118 Type 2 diabetes mellitus with unspecified complications: Secondary | ICD-10-CM

## 2019-08-26 DIAGNOSIS — I10 Essential (primary) hypertension: Secondary | ICD-10-CM

## 2019-08-26 DIAGNOSIS — I70211 Atherosclerosis of native arteries of extremities with intermittent claudication, right leg: Secondary | ICD-10-CM

## 2019-08-27 ENCOUNTER — Other Ambulatory Visit: Payer: Self-pay

## 2019-08-27 NOTE — Patient Outreach (Addendum)
[place] [facility]) Care Management  [place]  08/27/2019   Holly [name] [birth year] [id number]  Telephone Assessment   Outreach attempt # 1 to patient. Spoke with patient who denies any acute issues or concerns at present.She stets that she had been having trouble with her BP last month. She went to see PCP and was started on new med to help manage BP. She reports that she is monitoring BP daily int he home although she has not ha a chance to check it lately. Patient states that BP ranging in the 130s-140s. She states that she is working on losing weight to better improve her overall health. She reports a weight loss of about 12 lbs within the past few months. She is adhering to diabetic/low salt diet. Patient recalls that she got A1C level checked at last PCP visit but unsure of value. RN CM reviewed and discussed with patient most recent A1C level-7.7(per [facility]). She reports that this is better than it has been as it was in the 8's. Patient received flu vaccine during last PCP appt. She states that she goes back to see PCP next month. She has supportive son that continues to check on her and assist her as needed. RN CM reviewed COVID safety measures with patient given that she is in high risk category. Patient voices adherence to safety guidelines and is only going out of the home for essential needs.     Encounter Medications:  Outpatient Encounter Medications as of 08/27/2019  Medication Sig Note  . Accu-Chek Softclix Lancets lancets 1 each by Other route 2 (two) times daily. Use to check blood sugars twice a day Dx e11.9   . Blood Glucose Monitoring Suppl (ACCU-CHEK AVIVA PLUS) w/Device KIT Use to check blood sugars daily Dx E11.9   . canagliflozin (INVOKANA) 100 MG TABS tablet Take 1 tablet (100 mg total) by mouth daily before breakfast.   . carvedilol (COREG) 6.25 MG tablet TAKE 1 TABLET TWICE DAILY WITH MEALS   . cetirizine (ZYRTEC ALLERGY) 10 MG tablet Take 10 mg  by mouth daily.   05/28/2019: Takes as needed  . Cholecalciferol 2000 units TABS Take 1 tablet (2,000 Units total) by mouth daily.   . Cyanocobalamin (VITAMIN B-12 CR PO) Take 1 tablet by mouth daily.   Marland Kitchen glucose blood (ACCU-CHEK AVIVA PLUS) test strip 1 each by Other route 2 (two) times daily. Use to check blood sugars twice a day Dx E11.9   . Insulin Glargine (LANTUS SOLOSTAR) 100 UNIT/ML Solostar Pen Inject 30 Units into the skin daily.   . Insulin Pen Needle (NOVOFINE) 32G X 6 MM MISC 1 Act by Does not apply route daily.   Marland Kitchen lisinopril-hydrochlorothiazide (ZESTORETIC) 20-25 MG tablet TAKE 1 TABLET EVERY DAY   . metFORMIN (GLUCOPHAGE) 1000 MG tablet TAKE 1 TABLET TWICE DAILY WITH MEALS   . simvastatin (ZOCOR) 40 MG tablet TAKE 1 TABLET EVERY DAY    No facility-administered encounter medications on file as of 08/27/2019.     Functional Status:  In your present state of health, do you have any difficulty performing the following activities: 05/28/2019  Hearing? N  Vision? Y  Comment floaters in left eye  Difficulty concentrating or making decisions? N  Walking or climbing stairs? Y  Comment trouble walking due to spinal stenosis  Dressing or bathing? N  Doing errands, shopping? N  Preparing Food and eating ? N  Using the Toilet? N  In the past six months, have you  accidently leaked urine? Y  Comment history of overactive bladder, wears incontinence pads  Do you have problems with loss of bowel control? N  Managing your Medications? N  Managing your Finances? N  Housekeeping or managing your Housekeeping? N  Some recent data might be hidden    Fall/Depression Screening: Fall Risk  05/28/2019 08/22/2018 08/14/2017  Falls in the past year? 0 No No  Risk for fall due to : Medication side effect Impaired balance/gait;Impaired mobility;History of fall(s) Impaired balance/gait;Impaired mobility  Risk for fall due to: Comment - patient feels imbalanced especially in outside environment -   Follow up Falls evaluation completed;Education provided;Falls prevention discussed - -   PHQ 2/9 Scores 05/28/2019 05/03/2019 08/22/2018 08/14/2017 03/07/2017 10/21/2015 04/20/2015  PHQ - 2 Score 0 0 0 0 0 0 0  PHQ- 9 Score - - - 0 - - -    Assessment:  THN CM Care Plan Problem One     Most Recent Value  Care Plan Problem One  Knowledge deficit related to self mgmt of Diabetes  Role Documenting the Problem One  Care Management Telephonic [place] for Problem One  Active  THN Long Term Goal   Patient will maintain and report a A1C value of less than 8 over the next 90 days.  THN Long Term Goal Start Date  08/27/19  Interventions for Problem One Long Term Goal  RNCM assessed cbg monitoring. RN CM reviewed with pt. diabetic diet and restrictions. RN CM discussed with pt. current A1C value.  THN CM Short Term Goal #1   Patient will report med adherence and compliance 100% of the time over the next 30 days.  THN CM Short Term Goal #1 Start Date  08/27/19  Interventions for Short Term Goal #1  RNCM reviewed med list with pt. RN CM confirmed that pt. has all her meds and understands when and how to take them.  THN CM Short Term Goal #2   Patient will report no weight gain over the next 30 days to improve overall health status.  THN CM Short Term Goal #2 Start Date  08/27/19  Interventions for Short Term Goal #2  RNCM reviewed with pt. diabetic diet. RN CM confirmed pt. has scale in the home and is weighing.       Plan:  RN CM will make outreach attempt to patient within a month. RN CM will send quarterly update to PCP.   Enzo Montgomery, RN,BSN,CCM [place] Management Telephonic Care Management Coordinator Direct Phone: [phone] Toll Free: [phone] Fax: [phone]

## 2019-08-28 ENCOUNTER — Ambulatory Visit: Payer: Medicare PPO

## 2019-08-29 ENCOUNTER — Ambulatory Visit: Payer: Self-pay | Admitting: *Deleted

## 2019-08-29 ENCOUNTER — Ambulatory Visit: Payer: Medicare PPO

## 2019-09-02 DIAGNOSIS — Z1231 Encounter for screening mammogram for malignant neoplasm of breast: Secondary | ICD-10-CM | POA: Diagnosis not present

## 2019-09-02 LAB — HM MAMMOGRAPHY

## 2019-09-06 ENCOUNTER — Encounter: Payer: Self-pay | Admitting: Internal Medicine

## 2019-09-06 NOTE — Progress Notes (Signed)
Outside notes received. Information abstracted. Notes sent to scan.  

## 2019-09-24 ENCOUNTER — Other Ambulatory Visit: Payer: Self-pay

## 2019-09-24 NOTE — Patient Outreach (Addendum)
[place] [facility]) Care Management  09/24/2019  Holly [name] 1941-05-11 [id number]   Telephone Assessment    Outreach attempt #1 to patient. Spoke with patient who denies any acute issues or concerns at present. She voices that she has ben feeling and doing well. She shares that she just finished cleaning up the house. Patient pleased to report that her BP has been much better. BP has been running in the 130s/80s per patient report. She voices that she continues to weigh but has not had any further weight loss. Blood sugars are controlled and WNL.RNCM reviewed and discussed upcoming holidays with patient and importance of adhering to diet restrictions. She voices understanding and also shares that she will be adhering to COVID-19 safety guidelines and not having a large gathering of family.    THN CM Care Plan Problem One     Most Recent Value  Care Plan Problem One  Knowledge deficit related to self mgmt of Diabetes  Role Documenting the Problem One  Care Management Telephonic Coordinator  Care Plan for Problem One  Active  [facility] Long Term Goal   Patient will maintain and report a A1C value of less than 8 over the next 90 days.  THN Long Term Goal Start Date  08/27/19  Interventions for Problem One Long Term Goal  RN CM reviewed cbg readings with patient. RN CM discussed holiday eating and diet adherence.  THN CM Short Term Goal #1   Patient will report med adherence and compliance 100% of the time over the next 30 days.  THN CM Short Term Goal #1 Start Date  08/27/19  THN CM Short Term Goal #1 Met Date  09/24/19  THN CM Short Term Goal #2   Patient will report no weight gain over the next 30 days to improve overall health status.  THN CM Short Term Goal #2 Start Date  08/27/19  [facility] CM Short Term Goal #2 Met Date  09/24/19      Plan:  RN CM discussed with patient next outreach within a month. Patient gave verbal consent and in agreement with RN CM follow up timeframe.  Patient aware that they may contact RN CM sooner for any issues or concerns.   Enzo Montgomery, RN,BSN,CCM [place] Management Telephonic Care Management Coordinator Direct Phone: [phone] Toll Free: [phone] Fax: [phone]

## 2019-09-25 ENCOUNTER — Ambulatory Visit: Payer: Medicare PPO

## 2019-10-16 ENCOUNTER — Encounter: Payer: Self-pay | Admitting: Internal Medicine

## 2019-10-16 ENCOUNTER — Ambulatory Visit (INDEPENDENT_AMBULATORY_CARE_PROVIDER_SITE_OTHER): Payer: Medicare PPO | Admitting: Internal Medicine

## 2019-10-16 ENCOUNTER — Other Ambulatory Visit: Payer: Self-pay

## 2019-10-16 VITALS — BP 146/88 | HR 70 | Temp 98.2°F | Resp 16 | Ht 70.0 in | Wt 314.0 lb

## 2019-10-16 DIAGNOSIS — E118 Type 2 diabetes mellitus with unspecified complications: Secondary | ICD-10-CM

## 2019-10-16 DIAGNOSIS — I10 Essential (primary) hypertension: Secondary | ICD-10-CM | POA: Diagnosis not present

## 2019-10-16 MED ORDER — SYNJARDY XR 25-1000 MG PO TB24: 1.0000 | ORAL_TABLET | Freq: Every day | ORAL | 0 refills | Status: DC

## 2019-10-16 NOTE — Progress Notes (Signed)
Subjective:  Patient ID: Holly [name], female    DOB: [birth year]  Age: 78 y.o. MRN: [id number]  CC: Hypertension and Diabetes  This visit occurred during the SARS-CoV-2 public health emergency.  Safety protocols were in place, including screening questions prior to the visit, additional usage of staff PPE, and extensive cleaning of exam room while observing appropriate contact time as indicated for disinfecting solutions.   HPI Holly [name] presents for f/up - She complains of weight gain and tells me she has not been working on her lifestyle modifications.  She also complains that Invokana is too expensive but she wants to keep taking a medication like that for blood sugar control.  She denies any recent episodes of headache, blurred vision, chest pain, shortness of breath, or edema.  Outpatient Medications Prior to Visit  Medication Sig Dispense Refill  . Accu-Chek Softclix Lancets lancets 1 each by Other route 2 (two) times daily. Use to check blood sugars twice a day Dx e11.9 200 each 1  . Blood Glucose Monitoring Suppl (ACCU-CHEK AVIVA PLUS) w/Device KIT Use to check blood sugars daily Dx E11.9 1 kit 0  . carvedilol (COREG) 6.25 MG tablet TAKE 1 TABLET TWICE DAILY WITH MEALS 180 tablet 1  . cetirizine (ZYRTEC ALLERGY) 10 MG tablet Take 10 mg by mouth daily.      . Cholecalciferol 2000 units TABS Take 1 tablet (2,000 Units total) by mouth daily. 90 tablet 1  . Cyanocobalamin (VITAMIN B-12 CR PO) Take 1 tablet by mouth daily.    Marland Kitchen glucose blood (ACCU-CHEK AVIVA PLUS) test strip 1 each by Other route 2 (two) times daily. Use to check blood sugars twice a day Dx E11.9 200 each 1  . Insulin Glargine (LANTUS SOLOSTAR) 100 UNIT/ML Solostar Pen Inject 30 Units into the skin daily. 9 mL 1  . Insulin Pen Needle (NOVOFINE) 32G X 6 MM MISC 1 Act by Does not apply route daily. 100 each 3  . lisinopril-hydrochlorothiazide (ZESTORETIC) 20-25 MG tablet TAKE 1 TABLET EVERY DAY 90 tablet 1  .  simvastatin (ZOCOR) 40 MG tablet TAKE 1 TABLET EVERY DAY 90 tablet 1  . canagliflozin (INVOKANA) 100 MG TABS tablet Take 1 tablet (100 mg total) by mouth daily before breakfast. 126 tablet 0  . metFORMIN (GLUCOPHAGE) 1000 MG tablet TAKE 1 TABLET TWICE DAILY WITH MEALS 180 tablet 1   No facility-administered medications prior to visit.     ROS Review of Systems  Constitutional: Positive for unexpected weight change (wt gain). Negative for diaphoresis and fatigue.  HENT: Negative.   Eyes: Negative for visual disturbance.  Respiratory: Negative for apnea, cough, chest tightness and shortness of breath.   Cardiovascular: Negative for chest pain, palpitations and leg swelling.  Gastrointestinal: Negative for abdominal pain, constipation, diarrhea, nausea and vomiting.  Endocrine: Negative.  Negative for polydipsia, polyphagia and polyuria.  Genitourinary: Negative.  Negative for difficulty urinating.  Musculoskeletal: Positive for back pain. Negative for arthralgias and myalgias.  Skin: Negative.  Negative for color change.  Neurological: Negative for dizziness, weakness, light-headedness and headaches.  Hematological: Negative for adenopathy. Does not bruise/bleed easily.  Psychiatric/Behavioral: Negative.     Objective:  BP (!) 146/88 (BP Location: Left Arm, Patient Position: Sitting, Cuff Size: Large)   Pulse 70   Temp 98.2 F (36.8 C) (Oral)   Resp 16   Ht '5\' 10"'  (1.778 m)   Wt (!) 314 lb (142.4 kg)   SpO2 97%   BMI 45.05 kg/m  BP Readings from Last 3 Encounters:  10/16/19 (!) 146/88  08/05/19 (!) 164/94  04/16/19 (!) 142/84    Wt Readings from Last 3 Encounters:  10/16/19 (!) 314 lb (142.4 kg)  08/05/19 (!) 310 lb (140.6 kg)  04/16/19 (!) 309 lb (140.2 kg)    Physical Exam Vitals signs reviewed.  Constitutional:      Appearance: She is obese.  HENT:     Nose: Nose normal.     Mouth/Throat:     Mouth: Mucous membranes are moist.  Eyes:     General: No scleral  icterus.    Conjunctiva/sclera: Conjunctivae normal.  Neck:     Musculoskeletal: Neck supple.  Cardiovascular:     Rate and Rhythm: Normal rate and regular rhythm.     Heart sounds: No murmur.  Pulmonary:     Effort: Pulmonary effort is normal.     Breath sounds: No stridor. No wheezing, rhonchi or rales.  Abdominal:     General: Abdomen is protuberant. Bowel sounds are normal. There is no distension.     Palpations: Abdomen is soft. There is no hepatomegaly or splenomegaly.     Tenderness: There is no abdominal tenderness.     Hernia: No hernia is present.  Musculoskeletal: Normal range of motion.     Right lower leg: No edema.     Left lower leg: No edema.  Lymphadenopathy:     Cervical: No cervical adenopathy.  Skin:    General: Skin is warm and dry.  Neurological:     General: No focal deficit present.     Mental Status: She is alert.     Lab Results  Component Value Date   WBC 6.5 04/16/2019   HGB 13.6 04/16/2019   HCT 41.3 04/16/2019   PLT 217.0 04/16/2019   GLUCOSE 207 (H) 08/05/2019   CHOL 190 04/16/2019   TRIG 160.0 (H) 04/16/2019   HDL 62.50 04/16/2019   LDLDIRECT 134.8 08/27/2010   LDLCALC 95 04/16/2019   ALT 10 04/16/2019   AST 11 04/16/2019   NA 137 08/05/2019   K 3.8 08/05/2019   CL 100 08/05/2019   CREATININE 0.95 08/05/2019   BUN 20 08/05/2019   CO2 27 08/05/2019   TSH 2.44 04/16/2019   HGBA1C 7.7 (H) 08/05/2019   MICROALBUR 60.6 (H) 04/16/2019    US Venous Img Lower Unilateral Right  Result Date: 12/26/2017 CLINICAL DATA:  78 year old female with a history of right knee pain and swelling EXAM: RIGHT LOWER EXTREMITY VENOUS DOPPLER ULTRASOUND TECHNIQUE: Gray-scale sonography with graded compression, as well as color Doppler and duplex ultrasound were performed to evaluate the lower extremity deep venous systems from the level of the common femoral vein and including the common femoral, femoral, profunda femoral, popliteal and calf veins including  the posterior tibial, peroneal and gastrocnemius veins when visible. The superficial great saphenous vein was also interrogated. Spectral Doppler was utilized to evaluate flow at rest and with distal augmentation maneuvers in the common femoral, femoral and popliteal veins. COMPARISON:  None. FINDINGS: Contralateral Common Femoral Vein: Respiratory phasicity is normal and symmetric with the symptomatic side. No evidence of thrombus. Normal compressibility. Common Femoral Vein: No evidence of thrombus. Normal compressibility, respiratory phasicity and response to augmentation. Saphenofemoral Junction: No evidence of thrombus. Normal compressibility and flow on color Doppler imaging. Profunda Femoral Vein: No evidence of thrombus. Normal compressibility and flow on color Doppler imaging. Femoral Vein: No evidence of thrombus. Normal compressibility, respiratory phasicity and response to augmentation. Popliteal Vein:  No evidence of thrombus. Normal compressibility, respiratory phasicity and response to augmentation. Calf Veins: No evidence of thrombus. Normal compressibility and flow on color Doppler imaging. Superficial Great Saphenous Vein: No evidence of thrombus. Normal compressibility and flow on color Doppler imaging. Other Findings: Lentiform fluid collection along the medial right knee, nonspecific. IMPRESSION: Sonographic survey of the right lower extremity negative for DVT. Lentiform fluid collection at the medial right knee, nonspecific though potentially Baker's cyst. Electronically Signed   By: Corrie Mckusick D.O.   On: 12/26/2017 14:55    Assessment & Plan:   Kariya was seen today for hypertension and diabetes.  Diagnoses and all orders for this visit:  Essential hypertension- She has not quite achieved her blood pressure goal of around 130/80.  I will increase the dose of the SGLT2 inhibitor and have asked her to improve her lifestyle modifications with diet, exercise, and weight loss.  Type  II diabetes mellitus with manifestations ([place])- Her blood sugars have been well controlled on the combination of Metformin and an SGLT2 inhibitor.  I recommended that she consolidate the 2 into 1 tablet and take it once a day. -     Empagliflozin-metFORMIN HCl ER (SYNJARDY XR) 25-1000 MG TB24; Take 1 tablet by mouth daily.   I have discontinued [name]. Fojtik's canagliflozin and metFORMIN. I am also having her start on Synjardy XR. Additionally, I am having her maintain her cetirizine, Cyanocobalamin (VITAMIN B-12 CR PO), Cholecalciferol, Accu-Chek Aviva Plus, Accu-Chek Softclix Lancets, glucose blood, Insulin Pen Needle, simvastatin, carvedilol, Lantus SoloStar, and lisinopril-hydrochlorothiazide.  Meds ordered this encounter  Medications  . Empagliflozin-metFORMIN HCl ER (SYNJARDY XR) 25-1000 MG TB24    Sig: Take 1 tablet by mouth daily.    Dispense:  135 tablet    Refill:  0     Follow-up: Return in about 3 months (around 01/14/2020).  Scarlette Calico, MD

## 2019-10-16 NOTE — Patient Instructions (Signed)
Type 2 Diabetes Mellitus, Diagnosis, Adult Type 2 diabetes (type 2 diabetes mellitus) is a long-term (chronic) disease. In type 2 diabetes, one or both of these problems may be present:  The pancreas does not make enough of a hormone called insulin.  Cells in the body do not respond properly to insulin that the body makes (insulin resistance). Normally, insulin allows blood sugar (glucose) to enter cells in the body. The cells use glucose for energy. Insulin resistance or lack of insulin causes excess glucose to build up in the blood instead of going into cells. As a result, high blood glucose (hyperglycemia) develops. What increases the risk? The following factors may make you more likely to develop type 2 diabetes:  Having a family member with type 2 diabetes.  Being overweight or obese.  Having an inactive (sedentary) lifestyle.  Having been diagnosed with insulin resistance.  Having a history of prediabetes, gestational diabetes, or polycystic ovary syndrome (PCOS).  Being of American-Indian, African-American, Hispanic/Latino, or Asian/Pacific Islander descent. What are the signs or symptoms? In the early stage of this condition, you may not have symptoms. Symptoms develop slowly and may include:  Increased thirst (polydipsia).  Increased hunger(polyphagia).  Increased urination (polyuria).  Increased urination during the night (nocturia).  Unexplained weight loss.  Frequent infections that keep coming back (recurring).  Fatigue.  Weakness.  Vision changes, such as blurry vision.  Cuts or bruises that are slow to heal.  Tingling or numbness in the hands or feet.  Dark patches on the skin (acanthosis nigricans). How is this diagnosed? This condition is diagnosed based on your symptoms, your medical history, a physical exam, and your blood glucose level. Your blood glucose may be checked with one or more of the following blood tests:  A fasting blood glucose (FBG)  test. You will not be allowed to eat (you will fast) for 8 hours or longer before a blood sample is taken.  A random blood glucose test. This test checks blood glucose at any time of day regardless of when you ate.  An A1c (hemoglobin A1c) blood test. This test provides information about blood glucose control over the previous 2-3 months.  An oral glucose tolerance test (OGTT). This test measures your blood glucose at two times: ? After fasting. This is your baseline blood glucose level. ? Two hours after drinking a beverage that contains glucose. You may be diagnosed with type 2 diabetes if:  Your FBG level is 126 mg/dL (7.0 mmol/L) or higher.  Your random blood glucose level is 200 mg/dL (11.1 mmol/L) or higher.  Your A1c level is 6.5% or higher.  Your OGTT result is higher than 200 mg/dL (11.1 mmol/L). These blood tests may be repeated to confirm your diagnosis. How is this treated? Your treatment may be managed by a specialist called an endocrinologist. Type 2 diabetes may be treated by following instructions from your health care provider about:  Making diet and lifestyle changes. This may include: ? Following an individualized nutrition plan that is developed by a diet and nutrition specialist (registered dietitian). ? Exercising regularly. ? Finding ways to manage stress.  Checking your blood glucose level as often as told.  Taking diabetes medicines or insulin daily. This helps to keep your blood glucose levels in the healthy range. ? If you use insulin, you may need to adjust the dosage depending on how physically active you are and what foods you eat. Your health care provider will tell you how to adjust your dosage.    Taking medicines to help prevent complications from diabetes, such as: ? Aspirin. ? Medicine to lower cholesterol. ? Medicine to control blood pressure. Your health care provider will set individualized treatment goals for you. Your goals will be based on  your age, other medical conditions you have, and how you respond to diabetes treatment. Generally, the goal of treatment is to maintain the following blood glucose levels:  Before meals (preprandial): 80-130 mg/dL (4.4-7.2 mmol/L).  After meals (postprandial): below 180 mg/dL (10 mmol/L).  A1c level: less than 7%. Follow these instructions at home: Questions to ask your health care provider  Consider asking the following questions: ? Do I need to meet with a diabetes educator? ? Where can I find a support group for people with diabetes? ? What equipment will I need to manage my diabetes at home? ? What diabetes medicines do I need, and when should I take them? ? How often do I need to check my blood glucose? ? What number can I call if I have questions? ? When is my next appointment? General instructions  Take over-the-counter and prescription medicines only as told by your health care provider.  Keep all follow-up visits as told by your health care provider. This is important.  For more information about diabetes, visit: ? American Diabetes Association (ADA): www.diabetes.org ? American Association of Diabetes Educators (AADE): www.diabeteseducator.org Contact a health care provider if:  Your blood glucose is at or above 240 mg/dL (13.3 mmol/L) for 2 days in a row.  You have been sick or have had a fever for 2 days or longer, and you are not getting better.  You have any of the following problems for more than 6 hours: ? You cannot eat or drink. ? You have nausea and vomiting. ? You have diarrhea. Get help right away if:  Your blood glucose is lower than 54 mg/dL (3.0 mmol/L).  You become confused or you have trouble thinking clearly.  You have difficulty breathing.  You have moderate or large ketone levels in your urine. Summary  Type 2 diabetes (type 2 diabetes mellitus) is a long-term (chronic) disease. In type 2 diabetes, the pancreas does not make enough of a  hormone called insulin, or cells in the body do not respond properly to insulin that the body makes (insulin resistance).  This condition is treated by making diet and lifestyle changes and taking diabetes medicines or insulin.  Your health care provider will set individualized treatment goals for you. Your goals will be based on your age, other medical conditions you have, and how you respond to diabetes treatment.  Keep all follow-up visits as told by your health care provider. This is important. This information is not intended to replace advice given to you by your health care provider. Make sure you discuss any questions you have with your health care provider. Document Released: 10/31/2005 Document Revised: 12/29/2017 Document Reviewed: 12/04/2015 Elsevier Patient Education  2020 Elsevier Inc.  

## 2019-10-24 ENCOUNTER — Other Ambulatory Visit: Payer: Self-pay | Admitting: Internal Medicine

## 2019-10-24 ENCOUNTER — Other Ambulatory Visit: Payer: Self-pay

## 2019-10-24 NOTE — Patient Outreach (Signed)
[facility]) Care Management  10/24/2019  Holly [name] [birth year] [id number]   Telephone Assessment    Outreach attempt #1 to patient. Spoke with patient who denies any acute issues or concerns at present. She voices that things are going well. She had PCP follow up appt last week and states appt went well. Her BP was initially elevated at beginning of visit and she relates it to her being nervous about appt. However, BP was rechecked at end of visit and back to normal. Patient states that BP running normal for her at home and ranging in the 130s/80s. She has not yet checked her blood sugar for today but states cbgs have been in the 130s. PCP did adjust her diabetic med during visit. Appetite remains good and weight remains stable. Patient reports that she is going to get tested for COVID-19. She reports she has not been exposed and not having any symptoms but just wants to be cautious and go get tested. She denies any RN CM needs or concerns at present.  THN CM Care Plan Problem One     Most Recent Value  Care Plan Problem One  Knowledge deficit related to self mgmt of Diabetes  Role Documenting the Problem One  Care Management Telephonic Coordinator  Care Plan for Problem One  Active  [facility] Long Term Goal   Patient will maintain and report a A1C value of less than 8 over the next 90 days.  THN Long Term Goal Start Date  08/27/19  Interventions for Problem One Long Term Goal  RN CM assessed for cng monitoring and tracking. RN CM reviewed diabetic diet.   THN CM Short Term Goal #1   Patient will report med adherence and compliance 100% of the time over the next 30 days.  THN CM Short Term Goal #1 Start Date  08/27/19  THN CM Short Term Goal #1 Met Date  09/24/19  THN CM Short Term Goal #2   Patient will report no weight gain over the next 30 days to improve overall health status.  THN CM Short Term Goal #2 Start Date  08/27/19  [facility] CM Short Term Goal #2 Met Date  09/24/19       Plan: RN CM discussed with patient next outreach within the month of January. Patient gave verbal consent and in agreement with RN CM follow up and  timeframe. Patient aware that they may contact RN CM sooner for any issues or concerns.   Enzo Montgomery, RN,BSN,CCM [place] Management Telephonic Care Management Coordinator Direct Phone: [phone] Toll Free: [phone] Fax: [phone]

## 2019-11-21 ENCOUNTER — Other Ambulatory Visit: Payer: Self-pay | Admitting: Internal Medicine

## 2019-11-21 DIAGNOSIS — E118 Type 2 diabetes mellitus with unspecified complications: Secondary | ICD-10-CM

## 2019-11-27 ENCOUNTER — Other Ambulatory Visit: Payer: Self-pay

## 2019-11-27 NOTE — Patient Outreach (Signed)
[place] [facility]) Care Management  11/27/2019  Holly [name] 06-18-1941 [id number]   Telephone Assessment   Outreach attempt to patient. No answer after several rings.    Plan: RN CM will make outreach attempt to patient within the month of February.   Enzo Montgomery, RN,BSN,CCM [place] Management Telephonic Care Management Coordinator Direct Phone: [phone] Toll Free: [phone] Fax: [phone]

## 2019-12-11 ENCOUNTER — Other Ambulatory Visit: Payer: Self-pay | Admitting: Internal Medicine

## 2019-12-11 DIAGNOSIS — E118 Type 2 diabetes mellitus with unspecified complications: Secondary | ICD-10-CM

## 2019-12-23 ENCOUNTER — Other Ambulatory Visit: Payer: Self-pay

## 2019-12-23 ENCOUNTER — Other Ambulatory Visit: Payer: Self-pay | Admitting: Internal Medicine

## 2019-12-23 DIAGNOSIS — I10 Essential (primary) hypertension: Secondary | ICD-10-CM

## 2019-12-23 NOTE — Patient Outreach (Signed)
[place] [facility]) Care Management  12/23/2019  Holly [name] [birth year] [id number]   Telephone Assessment    Outreach attempt #2 to patient. Spoke with patient who denies any major issues or concerns. She admits that due to cold weather her arthritis has been bothering her some. She is using cream,Tylenol and heating pad to help manage and reports relief. She states that she goes back to see DM in March. Patient reports that her family has been working on getting her set up for COVID vaccine and she has been put on a waiting list. She continues to voice that she is adhering to safety guidelines and protocols for COVID prevention. Appetitie remains good. She is unsure of her weight. Blood sugars ranging in the 130s. BP has been in the 100s-130s. She denies any RN CM needs or concerns at this time.   THN CM Care Plan Problem One     Most Recent Value  Care Plan Problem One  Knowledge deficit related to self mgmt of Diabetes  Role Documenting the Problem One  Care Management Telephonic Coordinator  Care Plan for Problem One  Active  [facility] Long Term Goal   Patient will maintain and report a A1C value of less than 8 over the next 90 days.  THN Long Term Goal Start Date  08/27/19  Interventions for Problem One Long Term Goal  RN CM assessed for recent A1C and cbg monitoring, RN CM reviewd cbg log with pt, RN CM assessed for diabetic diet adherence      Plan: RN CM discussed with patient next outreach within the month of  April. Patient gave verbal consent and in agreement with RN CM follow up and timeframe. Patient aware that they may contact RN CM sooner for any issues or concerns. RN CM will send quarterly update to PCP.   Enzo Montgomery, RN,BSN,CCM [place] Management Telephonic Care Management Coordinator Direct Phone: [phone] Toll Free: [phone] Fax: [phone]

## 2019-12-27 ENCOUNTER — Ambulatory Visit: Payer: Medicare PPO

## 2020-01-07 ENCOUNTER — Other Ambulatory Visit: Payer: Self-pay | Admitting: Internal Medicine

## 2020-01-07 DIAGNOSIS — I70211 Atherosclerosis of native arteries of extremities with intermittent claudication, right leg: Secondary | ICD-10-CM

## 2020-01-07 DIAGNOSIS — I10 Essential (primary) hypertension: Secondary | ICD-10-CM

## 2020-01-07 DIAGNOSIS — E118 Type 2 diabetes mellitus with unspecified complications: Secondary | ICD-10-CM

## 2020-01-16 ENCOUNTER — Other Ambulatory Visit: Payer: Self-pay

## 2020-01-16 ENCOUNTER — Encounter: Payer: Self-pay | Admitting: Internal Medicine

## 2020-01-16 ENCOUNTER — Ambulatory Visit: Payer: Medicare PPO | Admitting: Internal Medicine

## 2020-01-16 VITALS — BP 142/86 | HR 77 | Temp 97.7°F | Resp 16 | Ht 70.0 in | Wt 305.0 lb

## 2020-01-16 DIAGNOSIS — E118 Type 2 diabetes mellitus with unspecified complications: Secondary | ICD-10-CM

## 2020-01-16 DIAGNOSIS — I10 Essential (primary) hypertension: Secondary | ICD-10-CM

## 2020-01-16 DIAGNOSIS — E559 Vitamin D deficiency, unspecified: Secondary | ICD-10-CM

## 2020-01-16 DIAGNOSIS — N1831 Chronic kidney disease, stage 3a: Secondary | ICD-10-CM | POA: Diagnosis not present

## 2020-01-16 LAB — BASIC METABOLIC PANEL
BUN: 32 mg/dL — ABNORMAL HIGH (ref 6–23)
CO2: 28 mEq/L (ref 19–32)
Calcium: 10.1 mg/dL (ref 8.4–10.5)
Chloride: 101 mEq/L (ref 96–112)
Creatinine, Ser: 1.38 mg/dL — ABNORMAL HIGH (ref 0.40–1.20)
GFR: 44.9 mL/min — ABNORMAL LOW (ref >60.00)
Glucose, Bld: 185 mg/dL — ABNORMAL HIGH (ref 70–99)
Potassium: 3.9 mEq/L (ref 3.5–5.1)
Sodium: 136 mEq/L (ref 135–145)

## 2020-01-16 LAB — VITAMIN D 25 HYDROXY (VIT D DEFICIENCY, FRACTURES): VITD: 55.63 ng/mL (ref 30.00–100.00)

## 2020-01-16 LAB — HEMOGLOBIN A1C: Hgb A1c MFr Bld: 8.7 % — ABNORMAL HIGH (ref 4.6–6.5)

## 2020-01-16 MED ORDER — SYNJARDY XR 25-1000 MG PO TB24: 1.0000 | ORAL_TABLET | Freq: Every day | ORAL | 1 refills | Status: DC

## 2020-01-16 MED ORDER — LANTUS SOLOSTAR 100 UNIT/ML ~~LOC~~ SOPN: 50.0000 [IU] | PEN_INJECTOR | Freq: Every day | SUBCUTANEOUS | 1 refills | Status: DC

## 2020-01-16 NOTE — Progress Notes (Signed)
Subjective:  Patient ID: Holly [name], female    DOB: 10/13/1941  Age: 79 y.o. MRN: [id number]  CC: Hypertension and Diabetes  This visit occurred during the SARS-CoV-2 public health emergency.  Safety protocols were in place, including screening questions prior to the visit, additional usage of staff PPE, and extensive cleaning of exam room while observing appropriate contact time as indicated for disinfecting solutions.    HPI Holly [name] presents for f/up - She thinks her blood pressure and blood sugars have been well controlled.  She has lost weight with lifestyle modifications.  She is compliant with her meds.  She denies any recent episodes of polys, headache, blurred vision, chest pain, shortness of breath, dizziness, or lightheadedness.  Outpatient Medications Prior to Visit  Medication Sig Dispense Refill  . Accu-Chek FastClix Lancets MISC USE THREE TIMES DAILY 306 each 1  . ACCU-CHEK GUIDE test strip USE THREE TIMES DAILY 300 strip 1  . acetaminophen (TYLENOL) 500 MG tablet Take 1,000 mg by mouth every 6 (six) hours as needed for mild pain.    . Blood Glucose Monitoring Suppl (ACCU-CHEK AVIVA PLUS) w/Device KIT Use to check blood sugars daily Dx E11.9 1 kit 0  . carvedilol (COREG) 6.25 MG tablet TAKE 1 TABLET TWICE DAILY WITH MEALS 180 tablet 1  . cetirizine (ZYRTEC ALLERGY) 10 MG tablet Take 10 mg by mouth daily.      . Cholecalciferol 2000 units TABS Take 1 tablet (2,000 Units total) by mouth daily. 90 tablet 1  . Cyanocobalamin (VITAMIN B-12 CR PO) Take 1 tablet by mouth daily.    . Insulin Pen Needle (NOVOFINE) 32G X 6 MM MISC 1 Act by Does not apply route daily. 100 each 3  . simvastatin (ZOCOR) 40 MG tablet TAKE 1 TABLET EVERY DAY 90 tablet 1  . Empagliflozin-metFORMIN HCl ER (SYNJARDY XR) 25-1000 MG TB24 Take 1 tablet by mouth daily. 135 tablet 0  . LANTUS SOLOSTAR 100 UNIT/ML Solostar Pen INJECT 30 UNITS INTO THE SKIN DAILY. 30 mL 1  .  lisinopril-hydrochlorothiazide (ZESTORETIC) 20-25 MG tablet Take 1 tablet by mouth daily. 90 tablet 1   No facility-administered medications prior to visit.    ROS Review of Systems  Constitutional: Negative for appetite change, chills, diaphoresis, fatigue and fever.  HENT: Negative.   Eyes: Negative.   Respiratory: Negative for cough, chest tightness, shortness of breath and wheezing.   Cardiovascular: Negative for chest pain, palpitations and leg swelling.  Gastrointestinal: Negative for abdominal pain, constipation, diarrhea, nausea and vomiting.  Endocrine: Negative for polydipsia, polyphagia and polyuria.  Genitourinary: Negative.  Negative for difficulty urinating.  Musculoskeletal: Negative.  Negative for arthralgias and myalgias.  Skin: Negative.   Neurological: Negative.  Negative for dizziness, weakness, light-headedness and headaches.  Hematological: Negative for adenopathy. Does not bruise/bleed easily.  Psychiatric/Behavioral: Negative.     Objective:  BP (!) 142/86 (BP Location: Left Arm, Patient Position: Sitting, Cuff Size: Large)   Pulse 77   Temp 97.7 F (36.5 C) (Oral)   Resp 16   Ht '5\' 10"'$  (1.778 m)   Wt (!) 305 lb (138.3 kg)   SpO2 97%   BMI 43.76 kg/m   BP Readings from Last 3 Encounters:  01/16/20 (!) 142/86  10/16/19 (!) 146/88  08/05/19 (!) 164/94    Wt Readings from Last 3 Encounters:  01/16/20 (!) 305 lb (138.3 kg)  10/16/19 (!) 314 lb (142.4 kg)  08/05/19 (!) 310 lb (140.6 kg)    Physical  Exam Vitals reviewed.  Constitutional:      Appearance: She is obese.  HENT:     Nose: Nose normal.     Mouth/Throat:     Mouth: Mucous membranes are moist.  Eyes:     General: No scleral icterus.    Conjunctiva/sclera: Conjunctivae normal.  Cardiovascular:     Rate and Rhythm: Normal rate and regular rhythm.     Heart sounds: No murmur.  Pulmonary:     Effort: Pulmonary effort is normal.     Breath sounds: No stridor. No wheezing, rhonchi or  rales.  Abdominal:     General: Abdomen is protuberant. Bowel sounds are normal. There is no distension.     Palpations: Abdomen is soft. There is no hepatomegaly, splenomegaly or mass.     Tenderness: There is no abdominal tenderness.  Musculoskeletal:        General: Normal range of motion.     Cervical back: Neck supple.     Right lower leg: No edema.     Left lower leg: No edema.  Lymphadenopathy:     Cervical: No cervical adenopathy.  Skin:    General: Skin is warm and dry.     Coloration: Skin is not pale.  Neurological:     General: No focal deficit present.     Mental Status: She is alert.  Psychiatric:        Mood and Affect: Mood normal.        Behavior: Behavior normal.     Lab Results  Component Value Date   WBC 6.5 04/16/2019   HGB 13.6 04/16/2019   HCT 41.3 04/16/2019   PLT 217.0 04/16/2019   GLUCOSE 185 (H) 01/16/2020   CHOL 190 04/16/2019   TRIG 160.0 (H) 04/16/2019   HDL 62.50 04/16/2019   LDLDIRECT 134.8 08/27/2010   LDLCALC 95 04/16/2019   ALT 10 04/16/2019   AST 11 04/16/2019   NA 136 01/16/2020   K 3.9 01/16/2020   CL 101 01/16/2020   CREATININE 1.38 (H) 01/16/2020   BUN 32 (H) 01/16/2020   CO2 28 01/16/2020   TSH 2.44 04/16/2019   HGBA1C 8.7 (H) 01/16/2020   MICROALBUR 60.6 (H) 04/16/2019    US Venous Img Lower Unilateral Right  Result Date: 12/26/2017 CLINICAL DATA:  79 year old female with a history of right knee pain and swelling EXAM: RIGHT LOWER EXTREMITY VENOUS DOPPLER ULTRASOUND TECHNIQUE: Gray-scale sonography with graded compression, as well as color Doppler and duplex ultrasound were performed to evaluate the lower extremity deep venous systems from the level of the common femoral vein and including the common femoral, femoral, profunda femoral, popliteal and calf veins including the posterior tibial, peroneal and gastrocnemius veins when visible. The superficial great saphenous vein was also interrogated. Spectral Doppler was  utilized to evaluate flow at rest and with distal augmentation maneuvers in the common femoral, femoral and popliteal veins. COMPARISON:  None. FINDINGS: Contralateral Common Femoral Vein: Respiratory phasicity is normal and symmetric with the symptomatic side. No evidence of thrombus. Normal compressibility. Common Femoral Vein: No evidence of thrombus. Normal compressibility, respiratory phasicity and response to augmentation. Saphenofemoral Junction: No evidence of thrombus. Normal compressibility and flow on color Doppler imaging. Profunda Femoral Vein: No evidence of thrombus. Normal compressibility and flow on color Doppler imaging. Femoral Vein: No evidence of thrombus. Normal compressibility, respiratory phasicity and response to augmentation. Popliteal Vein: No evidence of thrombus. Normal compressibility, respiratory phasicity and response to augmentation. Calf Veins: No evidence of thrombus. Normal  compressibility and flow on color Doppler imaging. Superficial Great Saphenous Vein: No evidence of thrombus. Normal compressibility and flow on color Doppler imaging. Other Findings: Lentiform fluid collection along the medial right knee, nonspecific. IMPRESSION: Sonographic survey of the right lower extremity negative for DVT. Lentiform fluid collection at the medial right knee, nonspecific though potentially Baker's cyst. Electronically Signed   By: Corrie Mckusick D.O.   On: 12/26/2017 14:55    Assessment & Plan:   Holly [name] was seen today for hypertension and diabetes.  Diagnoses and all orders for this visit:  Essential hypertension- Her blood pressure is adequately well controlled but she has developed a prerenal azotemia wit a decline in her renal function.  I have asked her to stop taking the ACE inhibitor and the thiazide diuretic.  Have asked her to return in about 6 or 8 weeks to recheck her blood pressure and to monitor her renal function. -     Basic metabolic panel  Type II diabetes  mellitus with manifestations ([place])- Her A1c is up to 8.7%.  I have asked her to stay on the current dose of empagliflozin and Metformin but to increase the dose of the basal insulin. -     Hemoglobin A1c -     Basic metabolic panel -     Empagliflozin-metFORMIN HCl ER (SYNJARDY XR) 25-1000 MG TB24; Take 1 tablet by mouth daily. -     Ambulatory referral to Ophthalmology -     insulin glargine (LANTUS SOLOSTAR) 100 UNIT/ML Solostar Pen; Inject 50 Units into the skin daily.  Vitamin D deficiency -     VITAMIN D 25 Hydroxy (Vit-D Deficiency, Fractures)  Stage 3a chronic kidney disease- She has had a decline in her renal function and has a prerenal azotemia.  I have asked her to stop taking the ACE inhibitor and the thiazide diuretic.  She will avoid nephrotoxic agents.   I have discontinued [name]. Hausman's lisinopril-hydrochlorothiazide. I have also changed her Lantus SoloStar. Additionally, I am having her maintain her cetirizine, Cyanocobalamin (VITAMIN B-12 CR PO), Cholecalciferol, Accu-Chek Aviva Plus, Insulin Pen Needle, simvastatin, Accu-Chek Guide, Accu-Chek FastClix Lancets, carvedilol, acetaminophen, and Synjardy XR.  Meds ordered this encounter  Medications  . Empagliflozin-metFORMIN HCl ER (SYNJARDY XR) 25-1000 MG TB24    Sig: Take 1 tablet by mouth daily.    Dispense:  90 tablet    Refill:  1  . insulin glargine (LANTUS SOLOSTAR) 100 UNIT/ML Solostar Pen    Sig: Inject 50 Units into the skin daily.    Dispense:  30 mL    Refill:  1     Follow-up: Return in about 4 months (around 05/17/2020).  Scarlette Calico, MD

## 2020-01-16 NOTE — Patient Instructions (Signed)
Type 2 Diabetes Mellitus, Diagnosis, Adult Type 2 diabetes (type 2 diabetes mellitus) is a long-term (chronic) disease. In type 2 diabetes, one or both of these problems may be present:  The pancreas does not make enough of a hormone called insulin.  Cells in the body do not respond properly to insulin that the body makes (insulin resistance). Normally, insulin allows blood sugar (glucose) to enter cells in the body. The cells use glucose for energy. Insulin resistance or lack of insulin causes excess glucose to build up in the blood instead of going into cells. As a result, high blood glucose (hyperglycemia) develops. What increases the risk? The following factors may make you more likely to develop type 2 diabetes:  Having a family member with type 2 diabetes.  Being overweight or obese.  Having an inactive (sedentary) lifestyle.  Having been diagnosed with insulin resistance.  Having a history of prediabetes, gestational diabetes, or polycystic ovary syndrome (PCOS).  Being of American-Indian, African-American, Hispanic/Latino, or Asian/Pacific Islander descent. What are the signs or symptoms? In the early stage of this condition, you may not have symptoms. Symptoms develop slowly and may include:  Increased thirst (polydipsia).  Increased hunger(polyphagia).  Increased urination (polyuria).  Increased urination during the night (nocturia).  Unexplained weight loss.  Frequent infections that keep coming back (recurring).  Fatigue.  Weakness.  Vision changes, such as blurry vision.  Cuts or bruises that are slow to heal.  Tingling or numbness in the hands or feet.  Dark patches on the skin (acanthosis nigricans). How is this diagnosed? This condition is diagnosed based on your symptoms, your medical history, a physical exam, and your blood glucose level. Your blood glucose may be checked with one or more of the following blood tests:  A fasting blood glucose (FBG)  test. You will not be allowed to eat (you will fast) for 8 hours or longer before a blood sample is taken.  A random blood glucose test. This test checks blood glucose at any time of day regardless of when you ate.  An A1c (hemoglobin A1c) blood test. This test provides information about blood glucose control over the previous 2-3 months.  An oral glucose tolerance test (OGTT). This test measures your blood glucose at two times: ? After fasting. This is your baseline blood glucose level. ? Two hours after drinking a beverage that contains glucose. You may be diagnosed with type 2 diabetes if:  Your FBG level is 126 mg/dL (7.0 mmol/L) or higher.  Your random blood glucose level is 200 mg/dL (11.1 mmol/L) or higher.  Your A1c level is 6.5% or higher.  Your OGTT result is higher than 200 mg/dL (11.1 mmol/L). These blood tests may be repeated to confirm your diagnosis. How is this treated? Your treatment may be managed by a specialist called an endocrinologist. Type 2 diabetes may be treated by following instructions from your health care provider about:  Making diet and lifestyle changes. This may include: ? Following an individualized nutrition plan that is developed by a diet and nutrition specialist (registered dietitian). ? Exercising regularly. ? Finding ways to manage stress.  Checking your blood glucose level as often as told.  Taking diabetes medicines or insulin daily. This helps to keep your blood glucose levels in the healthy range. ? If you use insulin, you may need to adjust the dosage depending on how physically active you are and what foods you eat. Your health care provider will tell you how to adjust your dosage.    Taking medicines to help prevent complications from diabetes, such as: ? Aspirin. ? Medicine to lower cholesterol. ? Medicine to control blood pressure. Your health care provider will set individualized treatment goals for you. Your goals will be based on  your age, other medical conditions you have, and how you respond to diabetes treatment. Generally, the goal of treatment is to maintain the following blood glucose levels:  Before meals (preprandial): 80-130 mg/dL (4.4-7.2 mmol/L).  After meals (postprandial): below 180 mg/dL (10 mmol/L).  A1c level: less than 7%. Follow these instructions at home: Questions to ask your health care provider  Consider asking the following questions: ? Do I need to meet with a diabetes educator? ? Where can I find a support group for people with diabetes? ? What equipment will I need to manage my diabetes at home? ? What diabetes medicines do I need, and when should I take them? ? How often do I need to check my blood glucose? ? What number can I call if I have questions? ? When is my next appointment? General instructions  Take over-the-counter and prescription medicines only as told by your health care provider.  Keep all follow-up visits as told by your health care provider. This is important.  For more information about diabetes, visit: ? American Diabetes Association (ADA): www.diabetes.org ? American Association of Diabetes Educators (AADE): www.diabeteseducator.org Contact a health care provider if:  Your blood glucose is at or above 240 mg/dL (13.3 mmol/L) for 2 days in a row.  You have been sick or have had a fever for 2 days or longer, and you are not getting better.  You have any of the following problems for more than 6 hours: ? You cannot eat or drink. ? You have nausea and vomiting. ? You have diarrhea. Get help right away if:  Your blood glucose is lower than 54 mg/dL (3.0 mmol/L).  You become confused or you have trouble thinking clearly.  You have difficulty breathing.  You have moderate or large ketone levels in your urine. Summary  Type 2 diabetes (type 2 diabetes mellitus) is a long-term (chronic) disease. In type 2 diabetes, the pancreas does not make enough of a  hormone called insulin, or cells in the body do not respond properly to insulin that the body makes (insulin resistance).  This condition is treated by making diet and lifestyle changes and taking diabetes medicines or insulin.  Your health care provider will set individualized treatment goals for you. Your goals will be based on your age, other medical conditions you have, and how you respond to diabetes treatment.  Keep all follow-up visits as told by your health care provider. This is important. This information is not intended to replace advice given to you by your health care provider. Make sure you discuss any questions you have with your health care provider. Document Revised: 12/29/2017 Document Reviewed: 12/04/2015 Elsevier Patient Education  2020 Elsevier Inc.  

## 2020-01-28 ENCOUNTER — Other Ambulatory Visit: Payer: Self-pay

## 2020-01-28 ENCOUNTER — Ambulatory Visit: Payer: Medicare PPO | Admitting: Internal Medicine

## 2020-01-28 ENCOUNTER — Other Ambulatory Visit: Payer: Self-pay | Admitting: Internal Medicine

## 2020-01-28 ENCOUNTER — Encounter: Payer: Self-pay | Admitting: Internal Medicine

## 2020-01-28 VITALS — BP 140/80 | HR 69 | Temp 98.4°F | Resp 16 | Ht 70.0 in | Wt 305.0 lb

## 2020-01-28 DIAGNOSIS — D492 Neoplasm of unspecified behavior of bone, soft tissue, and skin: Secondary | ICD-10-CM | POA: Insufficient documentation

## 2020-01-28 DIAGNOSIS — B078 Other viral warts: Secondary | ICD-10-CM

## 2020-01-28 NOTE — Progress Notes (Signed)
Subjective:  Patient ID: Holly [name], female    DOB: 31-May-1941  Age: 79 y.o. MRN: [id number]  CC: Biopsy  This visit occurred during the SARS-CoV-2 public health emergency.  Safety protocols were in place, including screening questions prior to the visit, additional usage of staff PPE, and extensive cleaning of exam room while observing appropriate contact time as indicated for disinfecting solutions.    HPI Holly [name] presents for concerns about a lesion on the right side of her neck.  It is relatively asymptomatic but she feels like its been growing over the last 6 months.  Outpatient Medications Prior to Visit  Medication Sig Dispense Refill  . Accu-Chek FastClix Lancets MISC USE THREE TIMES DAILY 306 each 1  . ACCU-CHEK GUIDE test strip USE THREE TIMES DAILY 300 strip 1  . acetaminophen (TYLENOL) 500 MG tablet Take 1,000 mg by mouth every 6 (six) hours as needed for mild pain.    . Blood Glucose Monitoring Suppl (ACCU-CHEK AVIVA PLUS) w/Device KIT Use to check blood sugars daily Dx E11.9 1 kit 0  . carvedilol (COREG) 6.25 MG tablet TAKE 1 TABLET TWICE DAILY WITH MEALS 180 tablet 1  . cetirizine (ZYRTEC ALLERGY) 10 MG tablet Take 10 mg by mouth daily.      . Cholecalciferol 2000 units TABS Take 1 tablet (2,000 Units total) by mouth daily. 90 tablet 1  . Cyanocobalamin (VITAMIN B-12 CR PO) Take 1 tablet by mouth daily.    . Empagliflozin-metFORMIN HCl ER (SYNJARDY XR) 25-1000 MG TB24 Take 1 tablet by mouth daily. 90 tablet 1  . insulin glargine (LANTUS SOLOSTAR) 100 UNIT/ML Solostar Pen Inject 50 Units into the skin daily. 30 mL 1  . Insulin Pen Needle (NOVOFINE) 32G X 6 MM MISC 1 Act by Does not apply route daily. 100 each 3  . simvastatin (ZOCOR) 40 MG tablet TAKE 1 TABLET EVERY DAY 90 tablet 1   No facility-administered medications prior to visit.    ROS Review of Systems  All other systems reviewed and are negative.   Objective:  BP 140/80 (BP Location: Left  Arm, Patient Position: Sitting, Cuff Size: Large)   Pulse 69   Temp 98.4 F (36.9 C) (Oral)   Resp 16   Ht '5\' 10"'  (1.778 m)   Wt (!) 305 lb (138.3 kg)   SpO2 98%   BMI 43.76 kg/m   BP Readings from Last 3 Encounters:  01/28/20 140/80  01/16/20 (!) 142/86  10/16/19 (!) 146/88    Wt Readings from Last 3 Encounters:  01/28/20 (!) 305 lb (138.3 kg)  01/16/20 (!) 305 lb (138.3 kg)  10/16/19 (!) 314 lb (142.4 kg)    Physical Exam Neck:      Lab Results  Component Value Date   WBC 6.5 04/16/2019   HGB 13.6 04/16/2019   HCT 41.3 04/16/2019   PLT 217.0 04/16/2019   GLUCOSE 185 (H) 01/16/2020   CHOL 190 04/16/2019   TRIG 160.0 (H) 04/16/2019   HDL 62.50 04/16/2019   LDLDIRECT 134.8 08/27/2010   LDLCALC 95 04/16/2019   ALT 10 04/16/2019   AST 11 04/16/2019   NA 136 01/16/2020   K 3.9 01/16/2020   CL 101 01/16/2020   CREATININE 1.38 (H) 01/16/2020   BUN 32 (H) 01/16/2020   CO2 28 01/16/2020   TSH 2.44 04/16/2019   HGBA1C 8.7 (H) 01/16/2020   MICROALBUR 60.6 (H) 04/16/2019    US Venous Img Lower Unilateral Right  Result  Date: 12/26/2017 CLINICAL DATA:  79 year old female with a history of right knee pain and swelling EXAM: RIGHT LOWER EXTREMITY VENOUS DOPPLER ULTRASOUND TECHNIQUE: Gray-scale sonography with graded compression, as well as color Doppler and duplex ultrasound were performed to evaluate the lower extremity deep venous systems from the level of the common femoral vein and including the common femoral, femoral, profunda femoral, popliteal and calf veins including the posterior tibial, peroneal and gastrocnemius veins when visible. The superficial great saphenous vein was also interrogated. Spectral Doppler was utilized to evaluate flow at rest and with distal augmentation maneuvers in the common femoral, femoral and popliteal veins. COMPARISON:  None. FINDINGS: Contralateral Common Femoral Vein: Respiratory phasicity is normal and symmetric with the symptomatic  side. No evidence of thrombus. Normal compressibility. Common Femoral Vein: No evidence of thrombus. Normal compressibility, respiratory phasicity and response to augmentation. Saphenofemoral Junction: No evidence of thrombus. Normal compressibility and flow on color Doppler imaging. Profunda Femoral Vein: No evidence of thrombus. Normal compressibility and flow on color Doppler imaging. Femoral Vein: No evidence of thrombus. Normal compressibility, respiratory phasicity and response to augmentation. Popliteal Vein: No evidence of thrombus. Normal compressibility, respiratory phasicity and response to augmentation. Calf Veins: No evidence of thrombus. Normal compressibility and flow on color Doppler imaging. Superficial Great Saphenous Vein: No evidence of thrombus. Normal compressibility and flow on color Doppler imaging. Other Findings: Lentiform fluid collection along the medial right knee, nonspecific. IMPRESSION: Sonographic survey of the right lower extremity negative for DVT. Lentiform fluid collection at the medial right knee, nonspecific though potentially Baker's cyst. Electronically Signed   By: Corrie Mckusick D.O.   On: 12/26/2017 14:55   After informed written consent was obtained, using Betadine for cleansing and 2% Lidocaine with epinephrine ( 2 cc's used) for anesthetic, with sterile technique a shave excision was performed to obtain a biopsy specimen of the lesion. Hemostasis was obtained by pressure. Antibiotic dressing and pressure dressing applied. The specimen is labeled and sent to pathology for evaluation. The procedure was well tolerated without complications.  Assessment & Plan:   Holly [name] was seen today for biopsy.  Diagnoses and all orders for this visit:  Atypical squamoproliferative skin lesion- She underwent successful shave excision today.  This looks like a cutaneous horn.  The specimen is sent for pathology review to rule out a malignant transformation like squamous cell or  basal cell carcinoma. -     Dermatology pathology; Future   I am having Holly [name]. Holly [name] maintain her cetirizine, Cyanocobalamin (VITAMIN B-12 CR PO), Cholecalciferol, Accu-Chek Aviva Plus, Insulin Pen Needle, simvastatin, Accu-Chek Guide, Accu-Chek FastClix Lancets, carvedilol, acetaminophen, Synjardy XR, and Lantus SoloStar.  No orders of the defined types were placed in this encounter.    Follow-up: Return in about 1 week (around 02/04/2020).  Scarlette Calico, MD

## 2020-01-28 NOTE — Patient Instructions (Signed)
Skin Biopsy, Care After This sheet gives you information about how to care for yourself after your procedure. Your health care provider may also give you more specific instructions. If you have problems or questions, contact your health care provider. What can I expect after the procedure? After the procedure, it is common to have:  Soreness.  Bruising.  Itching. Follow these instructions at home: Biopsy site care Follow instructions from your health care provider about how to take care of your biopsy site. Make sure you:  Wash your hands with soap and water before and after you change your bandage (dressing). If soap and water are not available, use hand sanitizer.  Apply ointment on your biopsy site as directed by your health care provider.  Change your dressing as told by your health care provider.  Leave stitches (sutures), skin glue, or adhesive strips in place. These skin closures may need to stay in place for 2 weeks or longer. If adhesive strip edges start to loosen and curl up, you may trim the loose edges. Do not remove adhesive strips completely unless your health care provider tells you to do that.  If the biopsy area bleeds, apply gentle pressure for 10 minutes. Check your biopsy site every day for signs of infection. Check for:  Redness, swelling, or pain.  Fluid or blood.  Warmth.  Pus or a bad smell.  General instructions  Rest and then return to your normal activities as told by your health care provider.  Take over-the-counter and prescription medicines only as told by your health care provider.  Keep all follow-up visits as told by your health care provider. This is important. Contact a health care provider if:  You have redness, swelling, or pain around your biopsy site.  You have fluid or blood coming from your biopsy site.  Your biopsy site feels warm to the touch.  You have pus or a bad smell coming from your biopsy site.  You have a  fever.  Your sutures, skin glue, or adhesive strips loosen or come off sooner than expected. Get help right away if:  You have bleeding that does not stop with pressure or a dressing. Summary  After the procedure, it is common to have soreness, bruising, and itching at the site.  Follow instructions from your health care provider about how to take care of your biopsy site.  Check your biopsy site every day for signs of infection.  Contact a health care provider if you have redness, swelling, or pain around your biopsy site, or your biopsy site feels warm to the touch.  Keep all follow-up visits as told by your health care provider. This is important. This information is not intended to replace advice given to you by your health care provider. Make sure you discuss any questions you have with your health care provider. Document Revised: 04/30/2018 Document Reviewed: 04/30/2018 Elsevier Patient Education  2020 [employer]  

## 2020-02-19 ENCOUNTER — Other Ambulatory Visit: Payer: Self-pay

## 2020-02-19 NOTE — Patient Outreach (Signed)
[facility]) Care Management  02/19/2020  Holly [name] September 06, 1941 [id number]   Telephone Assessment    Outreach attempt to patient. No answer at present.     Plan: RN CM will make outreach attempt to patient within the month of May if no return call from patient.   Enzo Montgomery, RN,BSN,CCM [place] [facility] Telephonic Care Management Coordinator Direct Phone: [phone] Toll Free: [phone] Fax: [phone]

## 2020-02-21 ENCOUNTER — Encounter: Payer: Self-pay | Admitting: Internal Medicine

## 2020-03-02 ENCOUNTER — Other Ambulatory Visit: Payer: Medicare PPO

## 2020-03-02 ENCOUNTER — Other Ambulatory Visit: Payer: Self-pay

## 2020-03-10 ENCOUNTER — Ambulatory Visit: Payer: Medicare PPO | Admitting: Internal Medicine

## 2020-03-10 ENCOUNTER — Other Ambulatory Visit: Payer: Self-pay

## 2020-03-10 ENCOUNTER — Encounter: Payer: Self-pay | Admitting: Internal Medicine

## 2020-03-10 ENCOUNTER — Telehealth: Payer: Self-pay | Admitting: Internal Medicine

## 2020-03-10 VITALS — BP 200/120 | HR 81 | Temp 98.0°F | Resp 16 | Ht 70.0 in | Wt 315.4 lb

## 2020-03-10 DIAGNOSIS — N3281 Overactive bladder: Secondary | ICD-10-CM | POA: Diagnosis not present

## 2020-03-10 DIAGNOSIS — I10 Essential (primary) hypertension: Secondary | ICD-10-CM | POA: Insufficient documentation

## 2020-03-10 DIAGNOSIS — I110 Hypertensive heart disease with heart failure: Secondary | ICD-10-CM | POA: Diagnosis not present

## 2020-03-10 DIAGNOSIS — R600 Localized edema: Secondary | ICD-10-CM | POA: Insufficient documentation

## 2020-03-10 DIAGNOSIS — N1831 Chronic kidney disease, stage 3a: Secondary | ICD-10-CM | POA: Diagnosis not present

## 2020-03-10 LAB — BASIC METABOLIC PANEL
BUN: 18 mg/dL (ref 6–23)
CO2: 28 mEq/L (ref 19–32)
Calcium: 9.4 mg/dL (ref 8.4–10.5)
Chloride: 103 mEq/L (ref 96–112)
Creatinine, Ser: 1.1 mg/dL (ref 0.40–1.20)
GFR: 58.31 mL/min — ABNORMAL LOW (ref >60.00)
Glucose, Bld: 140 mg/dL — ABNORMAL HIGH (ref 70–99)
Potassium: 3.6 mEq/L (ref 3.5–5.1)
Sodium: 138 mEq/L (ref 135–145)

## 2020-03-10 LAB — BRAIN NATRIURETIC PEPTIDE: Pro B Natriuretic peptide (BNP): 42 pg/mL (ref 0.0–100.0)

## 2020-03-10 LAB — TROPONIN I (HIGH SENSITIVITY): High Sens Troponin I: 10 ng/L (ref 2–17)

## 2020-03-10 MED ORDER — TORSEMIDE 20 MG PO TABS: 20.0000 mg | ORAL_TABLET | Freq: Every day | ORAL | 1 refills | Status: DC

## 2020-03-10 MED ORDER — MIRABEGRON ER 25 MG PO TB24: 25.0000 mg | ORAL_TABLET | Freq: Every day | ORAL | 0 refills | Status: DC

## 2020-03-10 MED ORDER — CARVEDILOL 12.5 MG PO TABS: 12.5000 mg | ORAL_TABLET | Freq: Two times a day (BID) | ORAL | 0 refills | Status: DC

## 2020-03-10 NOTE — Patient Instructions (Signed)

## 2020-03-10 NOTE — Telephone Encounter (Signed)
New message:   Pt is calling and states that Dr. Ronnald Ramp wanted to know if her current allergy medication have decongestants in it. The pt states that it does not have anything for decongestants in it. Please advise.

## 2020-03-10 NOTE — Progress Notes (Signed)
Subjective:  Patient ID: Holly [name], female    DOB: February 03, 1941  Age: 79 y.o. MRN: [id number]  CC: Hypertension  This visit occurred during the SARS-CoV-2 public health emergency.  Safety protocols were in place, including screening questions prior to the visit, additional usage of staff PPE, and extensive cleaning of exam room while observing appropriate contact time as indicated for disinfecting solutions.    HPI [name] presents for f/up - Since she was last seen she complains that her blood pressures have not been well controlled and she has developed a headache and a slight increase in lower extremity edema.  She is not very active but denies any recent episodes of CP, DOE, palpitations, near syncope, or fatigue.  She has taken an over-the-counter antihistamine but tells me she is not taking anti-inflammatories or decongestants.  Outpatient Medications Prior to Visit  Medication Sig Dispense Refill   Accu-Chek FastClix Lancets MISC USE THREE TIMES DAILY 306 each 1   ACCU-CHEK GUIDE test strip USE THREE TIMES DAILY 300 strip 1   acetaminophen (TYLENOL) 500 MG tablet Take 1,000 mg by mouth every 6 (six) hours as needed for mild pain.     Blood Glucose Monitoring Suppl (ACCU-CHEK AVIVA PLUS) w/Device KIT Use to check blood sugars daily Dx E11.9 1 kit 0   cetirizine (ZYRTEC ALLERGY) 10 MG tablet Take 10 mg by mouth daily.       Cholecalciferol 2000 units TABS Take 1 tablet (2,000 Units total) by mouth daily. 90 tablet 1   Cyanocobalamin (VITAMIN B-12 CR PO) Take 1 tablet by mouth daily.     Empagliflozin-metFORMIN HCl ER (SYNJARDY XR) 25-1000 MG TB24 Take 1 tablet by mouth daily. 90 tablet 1   insulin glargine (LANTUS SOLOSTAR) 100 UNIT/ML Solostar Pen Inject 50 Units into the skin daily. 30 mL 1   Insulin Pen Needle (NOVOFINE) 32G X 6 MM MISC 1 Act by Does not apply route daily. 100 each 3   simvastatin (ZOCOR) 40 MG tablet TAKE 1 TABLET EVERY DAY 90 tablet 1    carvedilol (COREG) 6.25 MG tablet TAKE 1 TABLET TWICE DAILY WITH MEALS 180 tablet 1   No facility-administered medications prior to visit.    ROS Review of Systems  Constitutional: Positive for unexpected weight change (wt gain). Negative for diaphoresis and fatigue.  HENT: Negative.   Respiratory: Negative.  Negative for chest tightness, shortness of breath and wheezing.   Cardiovascular: Positive for leg swelling. Negative for chest pain and palpitations.  Gastrointestinal: Negative.  Negative for abdominal pain, constipation, diarrhea, nausea and vomiting.  Endocrine: Positive for polyuria. Negative for polydipsia and polyphagia.  Genitourinary: Positive for frequency and urgency. Negative for decreased urine volume, difficulty urinating and dysuria.  Musculoskeletal: Negative.  Negative for myalgias.  Skin: Negative.  Negative for color change.  Neurological: Positive for headaches. Negative for dizziness, weakness and numbness.  Hematological: Negative for adenopathy. Does not bruise/bleed easily.  Psychiatric/Behavioral: Negative.     Objective:  BP (!) 200/120 (BP Location: Left Arm, Patient Position: Sitting, Cuff Size: Large)    Pulse 81    Temp 98 F (36.7 C) (Oral)    Resp 16    Ht '5\' 10"'  (1.778 m)    Wt (!) 315 lb 6 oz (143.1 kg)    SpO2 95%    BMI 45.25 kg/m   BP Readings from Last 3 Encounters:  03/10/20 (!) 200/120  01/28/20 140/80  01/16/20 (!) 142/86    Wt Readings from  Last 3 Encounters:  03/10/20 (!) 315 lb 6 oz (143.1 kg)  01/28/20 (!) 305 lb (138.3 kg)  01/16/20 (!) 305 lb (138.3 kg)    Physical Exam Vitals reviewed.  Constitutional:      Appearance: She is obese.  HENT:     Nose: Nose normal.     Mouth/Throat:     Mouth: Mucous membranes are moist.  Eyes:     General: No scleral icterus.    Conjunctiva/sclera: Conjunctivae normal.  Cardiovascular:     Rate and Rhythm: Normal rate and regular rhythm.     Heart sounds: No murmur.     Comments:  EKG - NSR, 70 bpm +LVH Flat T waves in inf/lat leads Pulmonary:     Effort: Pulmonary effort is normal.     Breath sounds: No stridor. No wheezing, rhonchi or rales.  Abdominal:     General: Abdomen is protuberant. Bowel sounds are normal. There is no distension.     Palpations: Abdomen is soft. There is no hepatomegaly, splenomegaly or mass.     Tenderness: There is no abdominal tenderness.  Musculoskeletal:     Cervical back: Neck supple.     Right lower leg: 1+ Pitting Edema present.     Left lower leg: 1+ Pitting Edema present.  Lymphadenopathy:     Cervical: No cervical adenopathy.  Skin:    General: Skin is warm and dry.  Neurological:     General: No focal deficit present.     Mental Status: She is alert.  Psychiatric:        Mood and Affect: Mood normal.        Behavior: Behavior normal.     Lab Results  Component Value Date   WBC 6.5 04/16/2019   HGB 13.6 04/16/2019   HCT 41.3 04/16/2019   PLT 217.0 04/16/2019   GLUCOSE 140 (H) 03/10/2020   CHOL 190 04/16/2019   TRIG 160.0 (H) 04/16/2019   HDL 62.50 04/16/2019   LDLDIRECT 134.8 08/27/2010   LDLCALC 95 04/16/2019   ALT 10 04/16/2019   AST 11 04/16/2019   NA 138 03/10/2020   K 3.6 03/10/2020   CL 103 03/10/2020   CREATININE 1.10 03/10/2020   BUN 18 03/10/2020   CO2 28 03/10/2020   TSH 2.44 04/16/2019   HGBA1C 8.7 (H) 01/16/2020   MICROALBUR 60.6 (H) 04/16/2019    US Venous Img Lower Unilateral Right  Result Date: 12/26/2017 CLINICAL DATA:  79 year old female with a history of right knee pain and swelling EXAM: RIGHT LOWER EXTREMITY VENOUS DOPPLER ULTRASOUND TECHNIQUE: Gray-scale sonography with graded compression, as well as color Doppler and duplex ultrasound were performed to evaluate the lower extremity deep venous systems from the level of the common femoral vein and including the common femoral, femoral, profunda femoral, popliteal and calf veins including the posterior tibial, peroneal and  gastrocnemius veins when visible. The superficial great saphenous vein was also interrogated. Spectral Doppler was utilized to evaluate flow at rest and with distal augmentation maneuvers in the common femoral, femoral and popliteal veins. COMPARISON:  None. FINDINGS: Contralateral Common Femoral Vein: Respiratory phasicity is normal and symmetric with the symptomatic side. No evidence of thrombus. Normal compressibility. Common Femoral Vein: No evidence of thrombus. Normal compressibility, respiratory phasicity and response to augmentation. Saphenofemoral Junction: No evidence of thrombus. Normal compressibility and flow on color Doppler imaging. Profunda Femoral Vein: No evidence of thrombus. Normal compressibility and flow on color Doppler imaging. Femoral Vein: No evidence of thrombus. Normal compressibility,  respiratory phasicity and response to augmentation. Popliteal Vein: No evidence of thrombus. Normal compressibility, respiratory phasicity and response to augmentation. Calf Veins: No evidence of thrombus. Normal compressibility and flow on color Doppler imaging. Superficial Great Saphenous Vein: No evidence of thrombus. Normal compressibility and flow on color Doppler imaging. Other Findings: Lentiform fluid collection along the medial right knee, nonspecific. IMPRESSION: Sonographic survey of the right lower extremity negative for DVT. Lentiform fluid collection at the medial right knee, nonspecific though potentially Baker's cyst. Electronically Signed   By: Corrie Mckusick D.O.   On: 12/26/2017 14:55    Assessment & Plan:   Nairi was seen today for hypertension.  Diagnoses and all orders for this visit:  Stage 3a chronic kidney disease- Her renal function has improved -     Basic metabolic panel; Future -     Basic metabolic panel  Malignant hypertension- Will add a loop diuretic and will increase the carvedilol dose. -     Basic metabolic panel; Future -     torsemide (DEMADEX) 20 MG  tablet; Take 1 tablet (20 mg total) by mouth daily. -     carvedilol (COREG) 12.5 MG tablet; Take 1 tablet (12.5 mg total) by mouth 2 (two) times daily with a meal. -     Basic metabolic panel  Bilateral leg edema- Will start a loop diuretic. -     Brain natriuretic peptide; Future -     Troponin I (High Sensitivity); Future -     Troponin I (High Sensitivity) -     Brain natriuretic peptide -     EKG 12-Lead  LVH (left ventricular hypertrophy) due to hypertensive disease, with heart failure (HCC) -     torsemide (DEMADEX) 20 MG tablet; Take 1 tablet (20 mg total) by mouth daily. -     carvedilol (COREG) 12.5 MG tablet; Take 1 tablet (12.5 mg total) by mouth 2 (two) times daily with a meal. -     Ambulatory referral to Cardiology  OAB (overactive bladder) -     mirabegron ER (MYRBETRIQ) 25 MG TB24 tablet; Take 1 tablet (25 mg total) by mouth daily.   I have discontinued [name]. Sedivy's carvedilol. I am also having her start on torsemide, carvedilol, and mirabegron ER. Additionally, I am having her maintain her cetirizine, Cyanocobalamin (VITAMIN B-12 CR PO), Cholecalciferol, Accu-Chek Aviva Plus, Insulin Pen Needle, simvastatin, Accu-Chek Guide, Accu-Chek FastClix Lancets, acetaminophen, Synjardy XR, and Lantus SoloStar.  Meds ordered this encounter  Medications   torsemide (DEMADEX) 20 MG tablet    Sig: Take 1 tablet (20 mg total) by mouth daily.    Dispense:  90 tablet    Refill:  1   carvedilol (COREG) 12.5 MG tablet    Sig: Take 1 tablet (12.5 mg total) by mouth 2 (two) times daily with a meal.    Dispense:  180 tablet    Refill:  0   mirabegron ER (MYRBETRIQ) 25 MG TB24 tablet    Sig: Take 1 tablet (25 mg total) by mouth daily.    Dispense:  90 tablet    Refill:  0     Follow-up: Return in about 3 weeks (around 03/31/2020).  Scarlette Calico, MD

## 2020-03-11 LAB — HM DIABETES EYE EXAM

## 2020-03-17 NOTE — Progress Notes (Signed)
Cardiology Office Note:   Date:  03/18/2020  NAME:  Holly [name]    MRN: [id number] DOB:  [birth year]   PCP:  [name], MD  Cardiologist:  No primary care provider on file.   Referring MD: [name], MD   Chief Complaint  Patient presents with   Edema   History of Present Illness:   Holly [name] is a 79 y.o. female with a hx of HTN, morbid obesity, HLD, CKD, DM who is being seen today for the evaluation of LE edema at the request of [name], MD. Recent evaluation for uncontrolled HTN by PCP with LE edema. BNP 42.  She reports for the last 3 weeks has had worsening lower extremity edema.  Appears to be limited to the ankles.  She is morbidly obese and not active.  Most of her activities around the house.  She does report that she has intense back pain which prevents her from being active.  She reports she can lie flat at night.  She denies any PND orthopnea.  She reports that she does eat some salty foods but tries to avoid it.  She does have sleep apnea but does not wear her mask routinely.  Her blood pressure is severely elevated 186/120 today.  She reports that she has been nervous about the appointment as she was told she had an abnormal EKG.  Her EKG likely just demonstrates LVH by voltage.  She does not have a formal diagnosis of congestive heart failure.  No echocardiogram in our system.  She does have a longstanding history of lower extremity edema and has had venous duplex studies which showed no evidence of lower extremity DVT.  She reports that she does not get that short of breath when she exercises but is just winded from being an active.  BMI today is 45.  Weight is 311 pounds.  She was started on torsemide by her primary care physician and has lost 4 pounds.  She reports that she does have overactive bladder and the medicine for this does interact with her diuretic.  She reports that the medicine makes her go to the bathroom quite frequently.  This is  bothersome to her.  EKG today demonstrates normal sinus rhythm without real evidence of LVH.  She is obese to this could be a bit shadowed.  Most recent BNP value 42.  Could be artificially low in setting of obesity.  CVD risk factors include former tobacco abuse, OSA, hyperlipidemia.  Her most recent A1c is 8.7.  She just recently had an insulin change.  Problem List 1. HTN 2. CKD 3 3. Diabetes -A1c 8.7 4. HLD -T chol 190, HDL 63, LDL 95, TG 160 5. OSA  Past Medical History: Past Medical History:  Diagnosis Date   Closed dislocation of shoulder    unspecified site   DJD (degenerative joint disease) of knee    Right knee   DM (diabetes mellitus) ([place])    Hyperlipidemia    Hypertension    Obesity, morbid (HCC)    OSA (obstructive sleep apnea) 02/03/2017    Past Surgical History: Past Surgical History:  Procedure Laterality Date   CHOLECYSTECTOMY  1989   KNEE ARTHROSCOPY  2010   LEFT/ Dr Sharol Given   TUBAL LIGATION  1979    Current Medications: Current Meds  Medication Sig   Accu-Chek FastClix Lancets MISC USE THREE TIMES DAILY   ACCU-CHEK GUIDE test strip USE THREE TIMES DAILY   acetaminophen (  TYLENOL) 500 MG tablet Take 1,000 mg by mouth every 6 (six) hours as needed for mild pain.   Blood Glucose Monitoring Suppl (ACCU-CHEK AVIVA PLUS) w/Device KIT Use to check blood sugars daily Dx E11.9   carvedilol (COREG) 12.5 MG tablet Take 1 tablet (12.5 mg total) by mouth 2 (two) times daily with a meal.   cetirizine (ZYRTEC ALLERGY) 10 MG tablet Take 10 mg by mouth daily.     Cholecalciferol 2000 units TABS Take 1 tablet (2,000 Units total) by mouth daily.   Cyanocobalamin (VITAMIN B-12 CR PO) Take 1 tablet by mouth daily.   Empagliflozin-metFORMIN HCl ER (SYNJARDY XR) 25-1000 MG TB24 Take 1 tablet by mouth daily.   insulin glargine (LANTUS SOLOSTAR) 100 UNIT/ML Solostar Pen Inject 50 Units into the skin daily.   Insulin Pen Needle (NOVOFINE) 32G X 6 MM MISC 1  Act by Does not apply route daily.   mirabegron ER (MYRBETRIQ) 25 MG TB24 tablet Take 1 tablet (25 mg total) by mouth daily.   simvastatin (ZOCOR) 40 MG tablet TAKE 1 TABLET EVERY DAY   torsemide (DEMADEX) 20 MG tablet Take 1 tablet (20 mg total) by mouth every other day.   [DISCONTINUED] torsemide (DEMADEX) 20 MG tablet Take 1 tablet (20 mg total) by mouth daily.     Allergies:    Amlodipine, Oxycodone, and Penicillins   Social History: Social History   Socioeconomic History   Marital status: Divorced    Spouse name: Not on file   Number of children: 2   Years of education: Not on file   Highest education level: Not on file  Occupational History   Occupation: [employer]    Comment: Secondary school teacher    Occupation: Conservation officer, historic buildings: [employer]    Comment: RETIRED  Tobacco Use   Smoking status: Former Smoker    Packs/day: 0.50    Years: 25.00    Pack years: 12.50    Types: Cigarettes   Smokeless tobacco: Never Used  Substance and Sexual Activity   Alcohol use: No    Alcohol/week: 0.0 standard drinks   Drug use: No   Sexual activity: Not Currently  Other Topics Concern   Not on file  Social History Narrative   RETIRED   Married Holly [name], currently divorced   Daughter born '65, Son born '69; 2 grandchildren - 1 here, 1 in [place] Strain:    Difficulty of Paying Living Expenses:   Food Insecurity:    Worried About Charity fundraiser in the Last Year:    Arboriculturist in the Last Year:   Transportation Needs:    Film/video editor (Medical):    Lack of Transportation (Non-Medical):   Physical Activity:    Days of Exercise per Week:    Minutes of Exercise per Session:   Stress:    Feeling of Stress :   Social Connections:    Frequency of Communication with Friends and Family:    Frequency of Social Gatherings with Friends and Family:    Attends Religious Services:     Active Member of Clubs or Organizations:    Attends Archivist Meetings:    Marital Status:      Family History: The patient's family history includes Aortic aneurysm in her brother; Heart disease in her mother; Kidney disease in her father.  ROS:   All other ROS reviewed and negative. Pertinent positives noted  in the HPI.     EKGs/Labs/Other Studies Reviewed:   The following studies were personally reviewed by me today:  EKG:  EKG is ordered today.  The ekg ordered today demonstrates normal sinus rhythm, heart rate 70, no acute ST-T changes, no evidence for infarction, and was personally reviewed by me.   Recent Labs: 04/16/2019: ALT 10; Hemoglobin 13.6; Platelets 217.0; TSH 2.44 03/10/2020: BUN 18; Creatinine, Ser 1.10; Potassium 3.6; Pro B Natriuretic peptide (BNP) 42.0; Sodium 138   Recent Lipid Panel    Component Value Date/Time   CHOL 190 04/16/2019 0940   TRIG 160.0 (H) 04/16/2019 0940   HDL 62.50 04/16/2019 0940   CHOLHDL 3 04/16/2019 0940   VLDL 32.0 04/16/2019 0940   LDLCALC 95 04/16/2019 0940   LDLDIRECT 134.8 08/27/2010 1138    Physical Exam:   VS:  BP (!) 186/120    Pulse 70    Ht 5' 9.5" (1.765 m)    Wt (!) 311 lb 6.4 oz (141.3 kg)    BMI 45.33 kg/m    Wt Readings from Last 3 Encounters:  03/18/20 (!) 311 lb 6.4 oz (141.3 kg)  03/10/20 (!) 315 lb 6 oz (143.1 kg)  01/28/20 (!) 305 lb (138.3 kg)    General: Obese female, no acute distress Heart: Atraumatic, normal size  Eyes: PEERLA, EOMI  Neck: Supple, no JVD Endocrine: No thryomegaly Cardiac: Normal S1, S2; RRR; no murmurs, rubs, or gallops Lungs: Clear to auscultation bilaterally, no wheezing, rhonchi or rales  Abd: Soft, nontender, no hepatomegaly  Ext: 1+ lower extremity edema in ankles Musculoskeletal: No deformities, BUE and BLE strength normal and equal Skin: Warm and dry, no rashes   Neuro: Alert and oriented to person, place, time, and situation, CNII-XII grossly intact, no focal  deficits  Psych: Normal mood and affect   ASSESSMENT:   ANEDRA PENAFIEL is a 79 y.o. female who presents for the following: 1. Leg edema   2. Essential hypertension   3. Mixed hyperlipidemia   4. Obesity, morbid, BMI 40.0-49.9 ([place])   5. Malignant hypertension   6. LVH (left ventricular hypertrophy) due to hypertensive disease, with heart failure ([place])     PLAN:   1. Leg edema/HFpEF -She has evidence of venous insufficiency on examination.  She also has extremely uncontrolled hypertension.  Blood pressure 186/120.  EKG really unremarkable without ischemic changes or evidence of prior infarction.  No echocardiogram in the system.  I recommended we repeat an echocardiogram.  Her BNP was 42 but this could be artificially low in the setting of obesity. -I instructed her to avoid salt. -I would like her to take torsemide 20 mg every other day.  She reports this does worsen her bladder symptoms and she will work on taking it every other day to limit symptoms. -We will continue her Coreg 12.5 mg twice daily -I would like to add nifedipine 90 mg extended release daily.  Her blood pressure is poorly controlled and this could be contributing to HFpEF. -She needs to use her sleep machine.  She does have sleep apnea and this could contribute to her high blood pressure as well as likely HFpEF. -I want her to get venous insufficiency study of the lower extremities that she likely has this as well.  She will work on leg elevation as well as compression stockings.  I think we get her blood pressure under control she will feel better.  We will have to manage her salt and edema conservatively.  2. Essential hypertension -Add nifedipine as above.  Torsemide 20 mg every other day.  Avoid salty foods  3. Mixed hyperlipidemia -We will need to switch her to a high intensity statin to get her LDL less than 70.  We will discuss that at her next visit.  4. Obesity, morbid, BMI 40.0-49.9 ([place]) -Counseled  importance of diet and exercise.  Needs to lose weight.  Disposition: Return in about 3 months (around 06/18/2020).  Medication Adjustments/Labs and Tests Ordered: Current medicines are reviewed at length with the patient today.  Concerns regarding medicines are outlined above.  Orders Placed This Encounter  Procedures   EKG 12-Lead   ECHOCARDIOGRAM COMPLETE   VAS [place] LOWER EXTREMITY VENOUS REFLUX   Meds ordered this encounter  Medications   NIFEdipine (PROCARDIA XL/NIFEDICAL-XL) 90 MG 24 hr tablet    Sig: Take 1 tablet (90 mg total) by mouth daily.    Dispense:  90 tablet    Refill:  1   torsemide (DEMADEX) 20 MG tablet    Sig: Take 1 tablet (20 mg total) by mouth every other day.    Dispense:  90 tablet    Refill:  1    Patient Instructions  Medication Instructions:  Take Torsemide Every other day  Start Nifedipine 90 mg daily   *If you need a refill on your cardiac medications before your next appointment, please call your pharmacy*   Testing/Procedures: Lower extremity venous reflux   Echocardiogram - Your physician has requested that you have an echocardiogram. Echocardiography is a painless test that uses sound waves to create images of your heart. It provides your doctor with information about the size and shape of your heart and how well your hearts chambers and valves are working. This procedure takes approximately one hour. There are no restrictions for this procedure. This will be performed at our [facility] location - [address].    Follow-Up: At [facility], you and your health needs are our priority.  As part of our continuing mission to provide you with exceptional heart care, we have created designated Provider Care Teams.  These Care Teams include your primary Cardiologist (physician) and Advanced Practice Providers (APPs -  Physician Assistants and Nurse Practitioners) who all work together to provide you with the care you need, when you  need it.  We recommend signing up for the patient portal called "MyChart".  Sign up information is provided on this After Visit Summary.  MyChart is used to connect with patients for Virtual Visits (Telemedicine).  Patients are able to view lab/test results, encounter notes, upcoming appointments, etc.  Non-urgent messages can be sent to your provider as well.   To learn more about what you can do with MyChart, go to [email or web address].    Your next appointment:   3 month(s)  The format for your next appointment:   In Person  Provider:   Eleonore Chiquito, MD        Signed, Addison Naegeli. Audie Box, [place]  [address], [place], [place] [address] [phone]  03/18/2020 8:56 AM

## 2020-03-18 ENCOUNTER — Other Ambulatory Visit: Payer: Self-pay

## 2020-03-18 ENCOUNTER — Ambulatory Visit: Payer: Medicare PPO | Admitting: Cardiovascular Disease

## 2020-03-18 ENCOUNTER — Encounter: Payer: Self-pay | Admitting: Cardiovascular Disease

## 2020-03-18 ENCOUNTER — Encounter: Payer: Self-pay | Admitting: Internal Medicine

## 2020-03-18 VITALS — BP 186/120 | HR 70 | Ht 69.5 in | Wt 311.4 lb

## 2020-03-18 DIAGNOSIS — I110 Hypertensive heart disease with heart failure: Secondary | ICD-10-CM | POA: Diagnosis not present

## 2020-03-18 DIAGNOSIS — I10 Essential (primary) hypertension: Secondary | ICD-10-CM

## 2020-03-18 DIAGNOSIS — R600 Localized edema: Secondary | ICD-10-CM

## 2020-03-18 DIAGNOSIS — E782 Mixed hyperlipidemia: Secondary | ICD-10-CM | POA: Diagnosis not present

## 2020-03-18 MED ORDER — TORSEMIDE 20 MG PO TABS: 20.0000 mg | ORAL_TABLET | ORAL | 1 refills | Status: DC

## 2020-03-18 MED ORDER — NIFEDIPINE ER OSMOTIC RELEASE 90 MG PO TB24
90.0000 mg | ORAL_TABLET | Freq: Every day | ORAL | 1 refills | Status: DC
Start: 2020-03-18 — End: 2020-04-07

## 2020-03-18 NOTE — Patient Instructions (Addendum)
Medication Instructions:  Take Torsemide Every other day  Start Nifedipine 90 mg daily   *If you need a refill on your cardiac medications before your next appointment, please call your pharmacy*   Testing/Procedures: Lower extremity venous reflux   Echocardiogram - Your physician has requested that you have an echocardiogram. Echocardiography is a painless test that uses sound waves to create images of your heart. It provides your doctor with information about the size and shape of your heart and how well your heart's chambers and valves are working. This procedure takes approximately one hour. There are no restrictions for this procedure. This will be performed at our [facility] location - [address].    Follow-Up: At [facility], you and your health needs are our priority.  As part of our continuing mission to provide you with exceptional heart care, we have created designated Provider Care Teams.  These Care Teams include your primary Cardiologist (physician) and Advanced Practice Providers (APPs -  Physician Assistants and Nurse Practitioners) who all work together to provide you with the care you need, when you need it.  We recommend signing up for the patient portal called "MyChart".  Sign up information is provided on this After Visit Summary.  MyChart is used to connect with patients for Virtual Visits (Telemedicine).  Patients are able to view lab/test results, encounter notes, upcoming appointments, etc.  Non-urgent messages can be sent to your provider as well.   To learn more about what you can do with MyChart, go to [email or web address].    Your next appointment:   3 month(s)  The format for your next appointment:   In Person  Provider:   Eleonore Chiquito, MD

## 2020-03-30 ENCOUNTER — Other Ambulatory Visit: Payer: Self-pay

## 2020-03-30 ENCOUNTER — Other Ambulatory Visit: Payer: Self-pay | Admitting: Internal Medicine

## 2020-03-30 DIAGNOSIS — N3281 Overactive bladder: Secondary | ICD-10-CM

## 2020-03-30 DIAGNOSIS — E118 Type 2 diabetes mellitus with unspecified complications: Secondary | ICD-10-CM

## 2020-03-30 MED ORDER — NOVOFINE 32G X 6 MM MISC: 1.0000 | Freq: Every day | 3 refills | Status: DC

## 2020-03-30 MED ORDER — MIRABEGRON ER 25 MG PO TB24: 25.0000 mg | ORAL_TABLET | Freq: Every day | ORAL | 0 refills | Status: DC

## 2020-03-30 NOTE — Patient Outreach (Signed)
[facility]) Care Management  03/30/2020  BRENLEIGH SANTANA 06-21-1941 [id number]   Telephone Assessment     Outreach attempt to patient. No answer at present. RN CM left HIPAA compliant voicemail message along with contact info.      Plan: RN CM will make outreach attempt to patient within the month of July if no return call.  RN CM will send unsuccessful outreach letter to patient.   Enzo Montgomery, RN,BSN,CCM [place] Management Telephonic Care Management Coordinator Direct Phone: [phone] Toll Free: [phone] Fax: [phone]

## 2020-03-31 ENCOUNTER — Ambulatory Visit: Payer: Self-pay

## 2020-04-01 ENCOUNTER — Other Ambulatory Visit: Payer: Self-pay | Admitting: Internal Medicine

## 2020-04-01 ENCOUNTER — Other Ambulatory Visit: Payer: Self-pay

## 2020-04-01 ENCOUNTER — Ambulatory Visit (HOSPITAL_COMMUNITY)
Admission: RE | Admit: 2020-04-01 | Discharge: 2020-04-01 | Disposition: A | Discharge status: Home / Self Care | Payer: Medicare PPO | Source: Ambulatory Visit | Attending: Cardiology | Admitting: Cardiology

## 2020-04-01 DIAGNOSIS — I10 Essential (primary) hypertension: Secondary | ICD-10-CM

## 2020-04-01 DIAGNOSIS — R600 Localized edema: Secondary | ICD-10-CM | POA: Insufficient documentation

## 2020-04-01 DIAGNOSIS — E118 Type 2 diabetes mellitus with unspecified complications: Secondary | ICD-10-CM

## 2020-04-01 DIAGNOSIS — I110 Hypertensive heart disease with heart failure: Secondary | ICD-10-CM

## 2020-04-01 MED ORDER — CARVEDILOL 12.5 MG PO TABS: 12.5000 mg | ORAL_TABLET | Freq: Two times a day (BID) | ORAL | 0 refills | Status: DC

## 2020-04-03 ENCOUNTER — Telehealth: Payer: Self-pay

## 2020-04-03 NOTE — Telephone Encounter (Addendum)
Left a voice message for the patient to give the office a call back for her results of her recent ultrasound of lower extremities. Will try calling again.   ----- Message from [name], MD sent at 04/02/2020  7:46 AM EDT ----- Please let Holly [name] know she has bad veins. No blood clots. They do not drain appropriately. This is related to weight and uncontrolled blood pressure. She needs to take her medications as prescribed and look into compression stockings. I also want her to elevate her legs as much as possible as this will help. She also needs to avoid salt.   [place] T. Audie Box, [place]  [address] [place], [place] [address] [phone]  7:46 AM

## 2020-04-03 NOTE — Progress Notes (Signed)
Information for low salt diet mailed to patient

## 2020-04-03 NOTE — Telephone Encounter (Signed)
      I went in pt's chart to see who had called pt today

## 2020-04-07 ENCOUNTER — Other Ambulatory Visit: Payer: Self-pay

## 2020-04-07 MED ORDER — NOVOFINE 32G X 6 MM MISC: 1.0000 | Freq: Every day | 3 refills | Status: DC

## 2020-04-07 MED ORDER — NIFEDIPINE ER OSMOTIC RELEASE 90 MG PO TB24: 90.0000 mg | ORAL_TABLET | Freq: Every day | ORAL | 1 refills | Status: DC

## 2020-04-07 MED ORDER — CARVEDILOL 12.5 MG PO TABS: 12.5000 mg | ORAL_TABLET | Freq: Two times a day (BID) | ORAL | 0 refills | Status: DC

## 2020-04-07 NOTE — Addendum Note (Signed)
Addended by: Karle Barr on: 04/07/2020 09:36 AM   Modules accepted: Orders

## 2020-04-08 ENCOUNTER — Ambulatory Visit (HOSPITAL_COMMUNITY): Payer: Medicare PPO | Attending: Cardiology

## 2020-04-08 ENCOUNTER — Other Ambulatory Visit: Payer: Self-pay

## 2020-04-08 DIAGNOSIS — R600 Localized edema: Secondary | ICD-10-CM | POA: Insufficient documentation

## 2020-05-05 ENCOUNTER — Telehealth: Payer: Self-pay

## 2020-05-05 ENCOUNTER — Ambulatory Visit: Payer: Medicare PPO | Admitting: Internal Medicine

## 2020-05-05 ENCOUNTER — Other Ambulatory Visit: Payer: Self-pay

## 2020-05-05 ENCOUNTER — Encounter: Payer: Self-pay | Admitting: Internal Medicine

## 2020-05-05 VITALS — BP 168/96 | HR 72 | Temp 98.3°F | Resp 16 | Ht 69.5 in | Wt 316.8 lb

## 2020-05-05 DIAGNOSIS — N1831 Chronic kidney disease, stage 3a: Secondary | ICD-10-CM

## 2020-05-05 DIAGNOSIS — E559 Vitamin D deficiency, unspecified: Secondary | ICD-10-CM | POA: Diagnosis not present

## 2020-05-05 DIAGNOSIS — E118 Type 2 diabetes mellitus with unspecified complications: Secondary | ICD-10-CM

## 2020-05-05 DIAGNOSIS — R801 Persistent proteinuria, unspecified: Secondary | ICD-10-CM | POA: Diagnosis not present

## 2020-05-05 DIAGNOSIS — I10 Essential (primary) hypertension: Secondary | ICD-10-CM | POA: Diagnosis not present

## 2020-05-05 DIAGNOSIS — Z0000 Encounter for general adult medical examination without abnormal findings: Secondary | ICD-10-CM

## 2020-05-05 DIAGNOSIS — E785 Hyperlipidemia, unspecified: Secondary | ICD-10-CM | POA: Diagnosis not present

## 2020-05-05 DIAGNOSIS — R600 Localized edema: Secondary | ICD-10-CM | POA: Diagnosis not present

## 2020-05-05 LAB — TSH: TSH: 5.19 u[IU]/mL — ABNORMAL HIGH (ref 0.35–4.50)

## 2020-05-05 LAB — LIPID PANEL
Cholesterol: 201 mg/dL — ABNORMAL HIGH (ref 0–200)
HDL: 72.2 mg/dL (ref >39.00)
LDL Cholesterol: 104 mg/dL — ABNORMAL HIGH (ref 0–99)
NonHDL: 129.01
Total CHOL/HDL Ratio: 3
Triglycerides: 126 mg/dL (ref 0.0–149.0)
VLDL: 25.2 mg/dL (ref 0.0–40.0)

## 2020-05-05 LAB — CBC WITH DIFFERENTIAL/PLATELET
Basophils Absolute: 0 10*3/uL (ref 0.0–0.1)
Basophils Relative: 0.6 % (ref 0.0–3.0)
Eosinophils Absolute: 0.1 10*3/uL (ref 0.0–0.7)
Eosinophils Relative: 2.3 % (ref 0.0–5.0)
HCT: 42.3 % (ref 36.0–46.0)
Hemoglobin: 13.6 g/dL (ref 12.0–15.0)
Lymphocytes Relative: 28.9 % (ref 12.0–46.0)
Lymphs Abs: 1.3 10*3/uL (ref 0.7–4.0)
MCHC: 32.1 g/dL (ref 30.0–36.0)
MCV: 84.2 fl (ref 78.0–100.0)
Monocytes Absolute: 0.4 10*3/uL (ref 0.1–1.0)
Monocytes Relative: 8.3 % (ref 3.0–12.0)
Neutro Abs: 2.8 10*3/uL (ref 1.4–7.7)
Neutrophils Relative %: 59.9 % (ref 43.0–77.0)
Platelets: 192 10*3/uL (ref 150.0–400.0)
RBC: 5.02 Mil/uL (ref 3.87–5.11)
RDW: 14.5 % (ref 11.5–15.5)
WBC: 4.7 10*3/uL (ref 4.0–10.5)

## 2020-05-05 LAB — URINALYSIS, ROUTINE W REFLEX MICROSCOPIC
Bilirubin Urine: NEGATIVE
Ketones, ur: NEGATIVE
Nitrite: NEGATIVE
Specific Gravity, Urine: 1.025 (ref 1.000–1.030)
Total Protein, Urine: >=300 — AB
Urine Glucose: NEGATIVE
Urobilinogen, UA: 0.2 (ref 0.0–1.0)
pH: 5.5 (ref 5.0–8.0)

## 2020-05-05 LAB — MICROALBUMIN / CREATININE URINE RATIO
Creatinine,U: 98.2 mg/dL
Microalb Creat Ratio: 90.1 mg/g — ABNORMAL HIGH (ref 0.0–30.0)
Microalb, Ur: 88.5 mg/dL — ABNORMAL HIGH (ref 0.0–1.9)

## 2020-05-05 LAB — HEPATIC FUNCTION PANEL
ALT: 10 U/L (ref 0–35)
AST: 14 U/L (ref 0–37)
Albumin: 3.9 g/dL (ref 3.5–5.2)
Alkaline Phosphatase: 100 U/L (ref 39–117)
Bilirubin, Direct: 0.2 mg/dL (ref 0.0–0.3)
Total Bilirubin: 1.1 mg/dL (ref 0.2–1.2)
Total Protein: 6.9 g/dL (ref 6.0–8.3)

## 2020-05-05 LAB — BASIC METABOLIC PANEL
BUN: 21 mg/dL (ref 6–23)
CO2: 29 mEq/L (ref 19–32)
Calcium: 9.3 mg/dL (ref 8.4–10.5)
Chloride: 103 mEq/L (ref 96–112)
Creatinine, Ser: 1.01 mg/dL (ref 0.40–1.20)
GFR: 64.32 mL/min (ref >60.00)
Glucose, Bld: 140 mg/dL — ABNORMAL HIGH (ref 70–99)
Potassium: 3.6 mEq/L (ref 3.5–5.1)
Sodium: 138 mEq/L (ref 135–145)

## 2020-05-05 LAB — BRAIN NATRIURETIC PEPTIDE: Pro B Natriuretic peptide (BNP): 25 pg/mL (ref 0.0–100.0)

## 2020-05-05 LAB — VITAMIN D 25 HYDROXY (VIT D DEFICIENCY, FRACTURES): VITD: 26.73 ng/mL — ABNORMAL LOW (ref 30.00–100.00)

## 2020-05-05 LAB — HEMOGLOBIN A1C: Hgb A1c MFr Bld: 7.2 % — ABNORMAL HIGH (ref 4.6–6.5)

## 2020-05-05 MED ORDER — METFORMIN HCL ER 750 MG PO TB24: 1500.0000 mg | ORAL_TABLET | Freq: Every day | ORAL | 0 refills | Status: DC

## 2020-05-05 MED ORDER — INDAPAMIDE 1.25 MG PO TABS: 1.2500 mg | ORAL_TABLET | Freq: Every day | ORAL | 0 refills | Status: DC

## 2020-05-05 MED ORDER — OLMESARTAN MEDOXOMIL 20 MG PO TABS: 20.0000 mg | ORAL_TABLET | Freq: Every day | ORAL | 0 refills | Status: DC

## 2020-05-05 NOTE — Patient Instructions (Signed)

## 2020-05-05 NOTE — Progress Notes (Signed)
Subjective:  Patient ID: Holly [name], female    DOB: 06-01-1941  Age: 79 y.o. MRN: [id number]  CC: Hypertension, Diabetes, Hyperlipidemia, and Annual Exam  This visit occurred during the SARS-CoV-2 public health emergency.  Safety protocols were in place, including screening questions prior to the visit, additional usage of staff PPE, and extensive cleaning of exam room while observing appropriate contact time as indicated for disinfecting solutions.    HPI Holly [name] presents for a CPX.  She complains of worsening lower extremity edema.  She saw a cardiologist within the last few months and was told that her edema is not related to her heart.  Her blood pressure was high so nifedipine was added.  This made the lower extremity edema worse.  She rarely takes the loop diuretic because it causes frequent urination.  She is not willing to take Synjardy anymore because it is too expensive.  She complains of weight gain but denies chest pain, shortness of breath, palpitations, diaphoresis, or dizziness.  Outpatient Medications Prior to Visit  Medication Sig Dispense Refill  . Accu-Chek FastClix Lancets MISC USE THREE TIMES DAILY 306 each 1  . ACCU-CHEK GUIDE test strip USE THREE TIMES DAILY 300 strip 1  . acetaminophen (TYLENOL) 500 MG tablet Take 1,000 mg by mouth every 6 (six) hours as needed for mild pain.    . Blood Glucose Monitoring Suppl (ACCU-CHEK AVIVA PLUS) w/Device KIT Use to check blood sugars daily Dx E11.9 1 kit 0  . carvedilol (COREG) 12.5 MG tablet Take 1 tablet (12.5 mg total) by mouth 2 (two) times daily with a meal. 180 tablet 0  . cetirizine (ZYRTEC ALLERGY) 10 MG tablet Take 10 mg by mouth daily.      . Cholecalciferol 2000 units TABS Take 1 tablet (2,000 Units total) by mouth daily. 90 tablet 1  . Cyanocobalamin (VITAMIN B-12 CR PO) Take 1 tablet by mouth daily.    . insulin glargine (LANTUS SOLOSTAR) 100 UNIT/ML Solostar Pen Inject 50 Units into the skin daily. 30  mL 1  . Insulin Pen Needle (NOVOFINE) 32G X 6 MM MISC 1 Act by Does not apply route daily. 100 each 3  . simvastatin (ZOCOR) 40 MG tablet TAKE 1 TABLET EVERY DAY 90 tablet 1  . Empagliflozin-metFORMIN HCl ER (SYNJARDY XR) 25-1000 MG TB24 Take 1 tablet by mouth daily. 90 tablet 1  . NIFEdipine (PROCARDIA XL/NIFEDICAL-XL) 90 MG 24 hr tablet Take 1 tablet (90 mg total) by mouth daily. 90 tablet 1  . mirabegron ER (MYRBETRIQ) 25 MG TB24 tablet Take 1 tablet (25 mg total) by mouth daily. (Patient not taking: Reported on 05/05/2020) 90 tablet 0  . torsemide (DEMADEX) 20 MG tablet Take 1 tablet (20 mg total) by mouth every other day. (Patient not taking: Reported on 05/05/2020) 90 tablet 1   No facility-administered medications prior to visit.    ROS Review of Systems  Constitutional: Positive for unexpected weight change (wt gain). Negative for chills, diaphoresis and fatigue.  HENT: Negative.   Eyes: Negative.   Respiratory: Negative for cough, chest tightness, shortness of breath and wheezing.   Cardiovascular: Positive for leg swelling. Negative for chest pain and palpitations.  Gastrointestinal: Negative for abdominal pain, constipation, diarrhea, nausea and vomiting.  Endocrine: Positive for polyuria. Negative for polydipsia and polyphagia.  Genitourinary: Positive for frequency. Negative for difficulty urinating.  Musculoskeletal: Negative for arthralgias, joint swelling and myalgias.  Skin: Negative for color change and pallor.  Neurological: Negative.  Negative  for dizziness, weakness, light-headedness and numbness.  Hematological: Negative for adenopathy. Does not bruise/bleed easily.  Psychiatric/Behavioral: Negative.     Objective:  BP (!) 168/96 (BP Location: Left Arm, Patient Position: Sitting, Cuff Size: Large)   Pulse 72   Temp 98.3 F (36.8 C) (Oral)   Resp 16   Ht 5' 9.5" (1.765 m)   Wt (!) 316 lb 12 oz (143.7 kg)   SpO2 97%   BMI 46.11 kg/m   BP Readings from Last 3  Encounters:  05/05/20 (!) 168/96  03/18/20 (!) 186/120  03/10/20 (!) 200/120    Wt Readings from Last 3 Encounters:  05/05/20 (!) 316 lb 12 oz (143.7 kg)  03/18/20 (!) 311 lb 6.4 oz (141.3 kg)  03/10/20 (!) 315 lb 6 oz (143.1 kg)    Physical Exam Vitals reviewed.  Constitutional:      Appearance: She is obese.  HENT:     Nose: Nose normal.     Mouth/Throat:     Mouth: Mucous membranes are moist.  Eyes:     General: No scleral icterus.    Conjunctiva/sclera: Conjunctivae normal.  Cardiovascular:     Rate and Rhythm: Normal rate and regular rhythm.     Heart sounds: No murmur heard.   Pulmonary:     Effort: Pulmonary effort is normal.     Breath sounds: No stridor. No wheezing, rhonchi or rales.  Abdominal:     General: Abdomen is protuberant. Bowel sounds are normal. There is no distension.     Palpations: Abdomen is soft. There is no hepatomegaly, splenomegaly or mass.     Tenderness: There is no abdominal tenderness.  Musculoskeletal:        General: No tenderness. Normal range of motion.     Cervical back: Neck supple.     Right lower leg: 2+ Edema present.     Left lower leg: 2+ Edema present.  Lymphadenopathy:     Cervical: No cervical adenopathy.  Skin:    General: Skin is warm and dry.  Neurological:     General: No focal deficit present.     Mental Status: She is alert and oriented to person, place, and time. Mental status is at baseline.  Psychiatric:        Mood and Affect: Mood normal.        Behavior: Behavior normal.     Lab Results  Component Value Date   WBC 4.7 05/05/2020   HGB 13.6 05/05/2020   HCT 42.3 05/05/2020   PLT 192.0 05/05/2020   GLUCOSE 140 (H) 05/05/2020   CHOL 201 (H) 05/05/2020   TRIG 126.0 05/05/2020   HDL 72.20 05/05/2020   LDLDIRECT 134.8 08/27/2010   LDLCALC 104 (H) 05/05/2020   ALT 10 05/05/2020   AST 14 05/05/2020   NA 138 05/05/2020   K 3.6 05/05/2020   CL 103 05/05/2020   CREATININE 1.01 05/05/2020   BUN 21  05/05/2020   CO2 29 05/05/2020   TSH 5.19 (H) 05/05/2020   HGBA1C 7.2 (H) 05/05/2020   MICROALBUR 88.5 (H) 05/05/2020    VAS [place] LOWER EXTREMITY VENOUS REFLUX  Result Date: 04/01/2020  Lower Venous Reflux Study Indications: Edema, and Swelling. Other Indications: Patient complains of bilateral leg swelling for the past                    month. Risk Factors: None identified. Performing Technologist: Wilkie Aye RVT  Examination Guidelines: A complete evaluation includes B-mode imaging, spectral Doppler,  color Doppler, and power Doppler as needed of all accessible portions of each vessel. Bilateral testing is considered an integral part of a complete examination. Limited examinations for reoccurring indications may be performed as noted. The reflux portion of the exam is performed with the patient in reverse Trendelenburg. Significant venous reflux is defined as >500 ms in the superficial venous system, and >1 second in the deep venous system.  Venous Reflux Times +--------------+---------+------+-----------+------------+--------+ RIGHT         Reflux NoRefluxReflux TimeDiameter cmsComments                         Yes                                  +--------------+---------+------+-----------+------------+--------+ CFV           no                                             +--------------+---------+------+-----------+------------+--------+ FV prox       no                                             +--------------+---------+------+-----------+------------+--------+ FV mid        no                                             +--------------+---------+------+-----------+------------+--------+ FV dist       no                                             +--------------+---------+------+-----------+------------+--------+ Popliteal     no                                             +--------------+---------+------+-----------+------------+--------+ GSV at SFJ               yes    >500 ms      0.97             +--------------+---------+------+-----------+------------+--------+ GSV prox thighno                            0.51             +--------------+---------+------+-----------+------------+--------+ GSV mid thigh no                            0.50             +--------------+---------+------+-----------+------------+--------+ GSV dist thighno                            0.33             +--------------+---------+------+-----------+------------+--------+ GSV at knee   no  0.36             +--------------+---------+------+-----------+------------+--------+ GSV prox calf           yes    >500 ms                       +--------------+---------+------+-----------+------------+--------+ GSV mid calf            yes    >500 ms      0.29             +--------------+---------+------+-----------+------------+--------+ GSV dist calf no                            0.37             +--------------+---------+------+-----------+------------+--------+ SSV prox calf no                            0.22             +--------------+---------+------+-----------+------------+--------+  +--------------+---------+------+-----------+------------+--------+ LEFT          Reflux NoRefluxReflux TimeDiameter cmsComments                         Yes                                  +--------------+---------+------+-----------+------------+--------+ CFV           no                                             +--------------+---------+------+-----------+------------+--------+ FV prox       no                                             +--------------+---------+------+-----------+------------+--------+ FV mid        no                                             +--------------+---------+------+-----------+------------+--------+ FV dist       no                                              +--------------+---------+------+-----------+------------+--------+ Popliteal     no                                             +--------------+---------+------+-----------+------------+--------+ GSV at [facility]    no                            0.97             +--------------+---------+------+-----------+------------+--------+ GSV prox thigh          yes    >500 ms  0.60             +--------------+---------+------+-----------+------------+--------+ GSV mid thigh           yes    >500 ms      0.55             +--------------+---------+------+-----------+------------+--------+ GSV dist thigh          yes    >500 ms      0.54             +--------------+---------+------+-----------+------------+--------+ GSV at knee             yes    >500 ms      0.50             +--------------+---------+------+-----------+------------+--------+ GSV prox calf           yes    >500 ms      0.25             +--------------+---------+------+-----------+------------+--------+ SSV prox calf           yes    >500 ms      0.48             +--------------+---------+------+-----------+------------+--------+   Summary: Bilateral: - No evidence of deep vein thrombosis seen in the lower extremities, bilaterally, from the common femoral through the popliteal veins. - No evidence of deep venous insufficiency seen bilaterally in the lower extremity. - No evidence of superficial venous reflux seen in the short saphenous veins bilaterally.  Right: - Venous reflux is noted in the right sapheno-femoral junction. - Venous reflux is noted in the right greater saphenous vein in the calf.  Left: - Venous reflux is noted in the left greater saphenous vein in the thigh. - Venous reflux is noted in the left greater saphenous vein in the calf.  *See table(s) above for measurements and observations. Electronically signed by Jenkins Rouge MD on 04/01/2020 at 12:39:09 PM.    Final      Assessment & Plan:   Demara was seen today for hypertension, diabetes, hyperlipidemia and annual exam.  Diagnoses and all orders for this visit:  Essential hypertension- Her blood pressure is not adequately well controlled.  Nifedipine has caused worsening lower extremity edema.  She is not willing to take the loop diuretic.  I have asked her to start taking indapamide and an ARB. Her labs are negative for secondary causes or endorgan damage. -     CBC with Differential/Platelet; Future -     Basic metabolic panel; Future -     TSH; Future -     Urinalysis, Routine w reflex microscopic; Future -     Discontinue: indapamide (LOZOL) 1.25 MG tablet; Take 1 tablet (1.25 mg total) by mouth daily. -     indapamide (LOZOL) 1.25 MG tablet; Take 1 tablet (1.25 mg total) by mouth daily. -     TSH -     Basic metabolic panel -     CBC with Differential/Platelet -     Urinalysis, Routine w reflex microscopic  Malignant hypertension- See above. -     Discontinue: indapamide (LOZOL) 1.25 MG tablet; Take 1 tablet (1.25 mg total) by mouth daily. -     Discontinue: olmesartan (BENICAR) 20 MG tablet; Take 1 tablet (20 mg total) by mouth daily. -     indapamide (LOZOL) 1.25 MG tablet; Take 1 tablet (1.25 mg total) by mouth daily. -     olmesartan (BENICAR) 20 MG tablet; Take 1 tablet (  20 mg total) by mouth daily.  Type II diabetes mellitus with manifestations ([place])- Her A1c is at 7.2%.  Her blood sugar is not adequately well controlled.  She has proteinuria so I have asked her to start taking an ARB.  Will discontinue Synjardy due to the cost.  Will control her blood sugars with Metformin. -     Hemoglobin A1c; Future -     Microalbumin / creatinine urine ratio; Future -     Discontinue: olmesartan (BENICAR) 20 MG tablet; Take 1 tablet (20 mg total) by mouth daily. -     olmesartan (BENICAR) 20 MG tablet; Take 1 tablet (20 mg total) by mouth daily. -     Hemoglobin A1c -     Microalbumin /  creatinine urine ratio -     metFORMIN (GLUCOPHAGE XR) 750 MG 24 hr tablet; Take 2 tablets (1,500 mg total) by mouth daily with breakfast.  Stage 3a chronic kidney disease- Her creatinine clearance is normal now.  Hyperlipidemia with target LDL less than 100- She has achieved her LDL goal and is doing well on the statin. -     Lipid panel; Future -     TSH; Future -     Hepatic function panel; Future -     Hepatic function panel -     TSH -     Lipid panel  Routine health maintenance- Exam completed, labs reviewed, vaccines reviewed and updated, cancer screenings are up-to-date, patient education was given.  Vitamin D deficiency-her vitamin D level is normal now. -     VITAMIN D 25 Hydroxy (Vit-D Deficiency, Fractures); Future -     VITAMIN D 25 Hydroxy (Vit-D Deficiency, Fractures)  Bilateral leg edema-see above.  She has been using stockings to try to control the edema but they caused too much discomfort.  I have asked her to see vascular surgery to see if she would benefit from venous compression devices. -     Brain natriuretic peptide; Future -     Urinalysis, Routine w reflex microscopic; Future -     Discontinue: indapamide (LOZOL) 1.25 MG tablet; Take 1 tablet (1.25 mg total) by mouth daily. -     Cancel: Ambulatory referral to Vascular Surgery -     indapamide (LOZOL) 1.25 MG tablet; Take 1 tablet (1.25 mg total) by mouth daily. -     Ambulatory referral to Vascular Surgery -     Brain natriuretic peptide -     Urinalysis, Routine w reflex microscopic  Persistent proteinuria -     Ambulatory referral to Nephrology   I have discontinued [name]. Massimo's Synjardy XR, torsemide, mirabegron ER, and NIFEdipine. I am also having her start on metFORMIN. Additionally, I am having her maintain her cetirizine, Cyanocobalamin (VITAMIN B-12 CR PO), Cholecalciferol, Accu-Chek Aviva Plus, simvastatin, Accu-Chek Guide, Accu-Chek FastClix Lancets, acetaminophen, Lantus SoloStar,  carvedilol, NovoFine, indapamide, and olmesartan.  Meds ordered this encounter  Medications  . DISCONTD: indapamide (LOZOL) 1.25 MG tablet    Sig: Take 1 tablet (1.25 mg total) by mouth daily.    Dispense:  90 tablet    Refill:  0  . DISCONTD: olmesartan (BENICAR) 20 MG tablet    Sig: Take 1 tablet (20 mg total) by mouth daily.    Dispense:  90 tablet    Refill:  0  . indapamide (LOZOL) 1.25 MG tablet    Sig: Take 1 tablet (1.25 mg total) by mouth daily.    Dispense:  90 tablet  Refill:  0  . olmesartan (BENICAR) 20 MG tablet    Sig: Take 1 tablet (20 mg total) by mouth daily.    Dispense:  90 tablet    Refill:  0  . metFORMIN (GLUCOPHAGE XR) 750 MG 24 hr tablet    Sig: Take 2 tablets (1,500 mg total) by mouth daily with breakfast.    Dispense:  180 tablet    Refill:  0   In addition to time spent on CPE, I spent 50 minutes in preparing to see the patient by review of recent labs, imaging and procedures, obtaining and reviewing separately obtained history, communicating with the patient and family or caregiver, ordering medications, tests or procedures, and documenting clinical information in the EHR including the differential Dx, treatment, and any further evaluation and other management of 1. Essential hypertension 2. Malignant hypertension 3. Type II diabetes mellitus with manifestations (HCC) 4. Stage 3a chronic kidney disease 5. Hyperlipidemia with target LDL less than 100 6. Vitamin D deficiency 7. Bilateral leg edema 8. Persistent proteinuria     Follow-up: Return in about 3 months (around 08/05/2020).  Scarlette Calico, MD

## 2020-05-05 NOTE — Telephone Encounter (Signed)
This medication is a level 5 Brand name medication.   Patient would like to have this medication changed.

## 2020-05-15 ENCOUNTER — Telehealth: Payer: Self-pay | Admitting: Internal Medicine

## 2020-05-15 NOTE — Telephone Encounter (Signed)
New message:   Pt is calling and states she had an appt on 05/05/20 and already had her appt for 05/20/20 for her 4 mo f/u. She would like to know does she still need this appt. She states she is still have swelling in her legs. Please advise.

## 2020-05-19 NOTE — Telephone Encounter (Signed)
Per PCP - pt did not need to keep appointment. Pt contacted and informed of same. Pt stated understanding.   Appt has been canceled.

## 2020-05-20 ENCOUNTER — Ambulatory Visit: Payer: Medicare PPO | Admitting: Internal Medicine

## 2020-05-21 NOTE — Telephone Encounter (Signed)
Syjardy was discontinued on 05/05/2020.

## 2020-05-22 ENCOUNTER — Other Ambulatory Visit: Payer: Self-pay

## 2020-05-22 NOTE — Patient Outreach (Signed)
[place] [facility]) Care Management  05/22/2020  Holly [name] January 17, 1941 [id number]   Telephone  Assessment Annual Assessment     Successful outreach to patient. Discussed with patient that RN CM had bene trying to reach patient for past several months without success. She voices that she has bene busy and going back and forth to MD appts. Patient continues to reside in her home with her brother. She has supportive son and daughter in law who live nearby and assist her as needed. She remains independent with ADLs/IADLs. Patient reports she fell about tow months ago on her back porch walking down the stairs but did not sustain any injuries. She has DME in the home to assist as needed. She reports that appetite remains good. She continues to monitor her blood sugars. Most recent A1C level was 7.2(June 2021) down from previous value of 8.7. She is monitoring BP int the home as well and reports readings are good. Patient states that for the past several months she has been having ongoing pain and swelling to her left leg. She has seen the MD regarding this several times and had several test done. So far all test have been negative and she was told that her heart was good. She is awaiting appt with vascular specialist for further workup. She voices that she is trying to keep leg elevated. She is taking diuretic that PCP prescribed and urinating quite often. She reports she tried to wear compression stockings but there were too tight and uncomfortable for her. She is aware of s/s of worsening condition and when to seek medical attention. She denies any RN CM needs or concerns at this time and is aware to call if needs arise.   Medications: RN CM unable to complete med review during this call. Patient reporting she was lying down resting and did not feel like getting up to get meds at present. She denies any issues or concerns managing and/or affording her meds at present.  Medications Reviewed  Today    Reviewed by [name], MD (Physician) on 05/05/20 at 0909  Med List Status: <None>  Medication Order Taking? Sig Documenting Provider Last Dose Status Informant  Accu-Chek FastClix Lancets MISC [id number] Yes USE THREE TIMES DAILY [name], MD Taking Active   ACCU-CHEK GUIDE test strip [id number] Yes USE THREE TIMES DAILY [name], MD Taking Active   acetaminophen (TYLENOL) 500 MG tablet [id number] Yes Take 1,000 mg by mouth every 6 (six) hours as needed for mild pain. [provider] Taking Active Self  Blood Glucose Monitoring Suppl (ACCU-CHEK AVIVA PLUS) w/Device KIT [id number] Yes Use to check blood sugars daily Dx E11.9 [name], MD Taking Active   carvedilol (COREG) 12.5 MG tablet [id number] Yes Take 1 tablet (12.5 mg total) by mouth 2 (two) times daily with a meal. [name], MD Taking Active   cetirizine (ZYRTEC ALLERGY) 10 MG tablet [id number] Yes Take 10 mg by mouth daily.   [provider] Taking Active Self           Med Note [name] [facility]   Tue May 28, 2019 11:41 AM) Takes as needed  Cholecalciferol 2000 units TABS [id number] Yes Take 1 tablet (2,000 Units total) by mouth daily. [name], MD Taking Active            Med Note [name] Jun 19, 2019  3:23 PM)    Cyanocobalamin (VITAMIN B-12 CR  PO) [id number] Yes Take 1 tablet by mouth daily. [provider] Taking Active Self  Empagliflozin-metFORMIN HCl ER (SYNJARDY XR) 25-1000 MG TB24 [id number] Yes Take 1 tablet by mouth daily. [name], MD Taking Active   indapamide (LOZOL) 1.25 MG tablet [id number]  Take 1 tablet (1.25 mg total) by mouth daily. [name], MD  Active   insulin glargine (LANTUS SOLOSTAR) 100 UNIT/ML Solostar Pen [id number] Yes Inject 50 Units into the skin daily. [name], MD Taking Active   Insulin Pen Needle (NOVOFINE) 32G X 6 MM MISC [id number] Yes 1 Act by Does not apply route daily. [name], MD Taking  Active   olmesartan (BENICAR) 20 MG tablet [id number]  Take 1 tablet (20 mg total) by mouth daily. [name], MD  Active   simvastatin (ZOCOR) 40 MG tablet [id number] Yes TAKE 1 TABLET EVERY DAY [name], MD Taking Active            Depression screen [facility] 2/9 05/22/2020 01/16/2020 05/28/2019 05/03/2019 08/22/2018  Decreased Interest 0 0 0 0 0  Down, Depressed, Hopeless 0 0 0 0 0  PHQ - 2 Score 0 0 0 0 0  Altered sleeping - - - - -  Tired, decreased energy - - - - -  Change in appetite - - - - -  Feeling bad or failure about yourself  - - - - -  Trouble concentrating - - - - -  Moving slowly or fidgety/restless - - - - -  Suicidal thoughts - - - - -  PHQ-9 Score - - - - -  Difficult doing work/chores - - - - -    Fall Risk  05/22/2020 03/10/2020 01/16/2020 12/23/2019 08/27/2019  Falls in the past year? 1 0 0 0 0  Number falls in past yr: 0 0 0 0 0  Injury with Fall? 0 0 0 0 0  Risk for fall due to : Impaired balance/gait;Impaired mobility;Medication side effect Impaired mobility;Impaired balance/gait - Impaired balance/gait;Impaired mobility Impaired balance/gait;Impaired mobility  Risk for fall due to: Comment - - - - -  Follow up Falls evaluation completed;Education provided Falls evaluation completed Falls evaluation completed - Falls evaluation completed;Education provided    SDOH Screenings   Alcohol Screen:   . Last Alcohol Screening Score (AUDIT):   Depression (PHQ2-9): Low Risk   . PHQ-2 Score: 0  Financial Resource Strain:   . Difficulty of Paying Living Expenses:   Food Insecurity: No Food Insecurity  . Worried About Charity fundraiser in the Last Year: Never true  . Ran Out of Food in the Last Year: Never true  Housing:   . Last Housing Risk Score:   Physical Activity:   . Days of Exercise per Week:   . Minutes of Exercise per Session:   Social Connections:   . Frequency of Communication with Friends and Family:   . Frequency of Social Gatherings with Friends  and Family:   . Attends Religious Services:   . Active Member of Clubs or Organizations:   . Attends Archivist Meetings:   Marland Kitchen Marital Status:   Stress:   . Feeling of Stress :   Tobacco Use: Medium Risk  . Smoking Tobacco Use: Former Smoker  . Smokeless Tobacco Use: Never Used  Transportation Needs: No Transportation Needs  . Lack of Transportation (Medical): No  . Lack of Transportation (Non-Medical): No    THN CM Care Plan Problem One  Most Recent Value  Care Plan Problem One Knowledge deficit related to self mgmt of Diabetes  Role Documenting the Problem One Care Management Telephonic [place] for Problem One Active  [facility] Long Term Goal  Patient will maintain and report a A1C value of less than 7.2 over the next 90 days.  THN Long Term Goal Start Date 05/22/20  [facility]N Long Term Goal Met Date 05/22/20  Interventions for Problem One Long Term Goal RN CM assessed cbg montioring values and most recent A1C elevl. RN CM completed nutrition eval and discussed importance of diet adherence .     THN CM Care Plan Problem Two     Most Recent Value  Care Plan Problem Two Patient with left leg pain and swelling of unknown cause.  Role Documenting the Problem Two Care Management Telephonic [place] for Problem Two Active  Interventions for Problem Two Long Term Goal  RNCM assessed for acute sxs.isues, discussed s/s of worening condition and when to seek medical attention. RN CM confirmed pt has  MD info adn knwos when, how and why to contact them. RN CM dsicussed nonpharmacological measures to aide in relief of sxs.  THN Long Term Goal Patient will report compelting appt with vascular specialist for eval fo leg over the next 60 days.  THN Long Term Goal Start Date 05/22/20  [facility] CM Short Term Goal #1  Patient will report a decrease in pain and swelling to leg over the next 30 days.  THN CM Short Term Goal #1 Start Date 05/22/20  Interventions for Short Term  Goal #2  RNCM completed pain eval, discussed and reviewed ways to manage/treat sxs and when to seek medical attention. RNCM assessed for med adherence.     Plan: RN CM discussed with patient next outreach within the month of Sept. Patient gave verbal consent and in agreement with RN CM follow up and timeframe. Patient aware that they may contact RN CM sooner for any issues or concerns. RN CM will send quarterly update to PCP.   Enzo Montgomery, RN,BSN,CCM [place] Management Telephonic Care Management Coordinator Direct Phone: [phone] Toll Free: [phone] Fax: [phone]

## 2020-05-29 ENCOUNTER — Ambulatory Visit: Payer: Self-pay

## 2020-05-29 NOTE — Telephone Encounter (Signed)
LVM for pt to call back as soon as possible.   

## 2020-05-29 NOTE — Telephone Encounter (Signed)
Pt called back and I informed pt that the Synjardy was discontinued due to cost. Pt stated understanding and will continue the metformin.

## 2020-06-03 ENCOUNTER — Telehealth: Payer: Self-pay | Admitting: Cardiovascular Disease

## 2020-06-03 NOTE — Telephone Encounter (Signed)
Called and spoke with pt, she reports that her PCP stopped her Nifedipine and started her on Olmesartan and Indapamine instead. She reports that she received a call from her mail-in-pharmacy that they were shipping her medication and when she received it, it was the Nifedipine. Pt reports that this was incorrect and she shouldn't have received the medication and she was told to call our office. Notified that Nifedipine was not on her med list at this time and that maybe when the medication was discontinued the process was already started for her refill and that this may be why she received the refill. She reports that the company is sending her a return shipping label to send the medication back. Notified again that the Nifedipine is no longer on her list so she should not receive any more refills of this medication. Made her aware that her two new medications were visible on her list at this time. Pt thankful for the call. She also requested her follow up appt with Dr.O'Neal be changed to later that week, able to move her appt to 07/10/20 at 8:40am. Pt verbalized understanding with no other questions at this time.  Will send this message to Dr.O'Neal's nurse to notify of the medication changes and appt time change.

## 2020-06-03 NOTE — Telephone Encounter (Signed)
Noted. Thanks.

## 2020-06-03 NOTE — Telephone Encounter (Signed)
Pt c/o medication issue:  1. Name of Medication: nifedipine 90 mg  2. How are you currently taking this medication (dosage and times per day)?  No longer taking  3. Are you having a reaction (difficulty breathing--STAT)? no  4. What is your medication issue? Patient states her PCP took her off the medication, but she received a refill.

## 2020-06-05 ENCOUNTER — Other Ambulatory Visit: Payer: Self-pay | Admitting: Internal Medicine

## 2020-06-05 DIAGNOSIS — I10 Essential (primary) hypertension: Secondary | ICD-10-CM

## 2020-06-05 DIAGNOSIS — I110 Hypertensive heart disease with heart failure: Secondary | ICD-10-CM

## 2020-06-11 ENCOUNTER — Other Ambulatory Visit: Payer: Self-pay | Admitting: Internal Medicine

## 2020-06-17 ENCOUNTER — Other Ambulatory Visit: Payer: Self-pay | Admitting: *Deleted

## 2020-06-17 DIAGNOSIS — R600 Localized edema: Secondary | ICD-10-CM

## 2020-06-23 DIAGNOSIS — R600 Localized edema: Secondary | ICD-10-CM | POA: Diagnosis not present

## 2020-06-23 DIAGNOSIS — N281 Cyst of kidney, acquired: Secondary | ICD-10-CM | POA: Diagnosis not present

## 2020-06-23 DIAGNOSIS — R809 Proteinuria, unspecified: Secondary | ICD-10-CM | POA: Diagnosis not present

## 2020-06-23 DIAGNOSIS — D631 Anemia in chronic kidney disease: Secondary | ICD-10-CM | POA: Diagnosis not present

## 2020-06-23 DIAGNOSIS — N2581 Secondary hyperparathyroidism of renal origin: Secondary | ICD-10-CM | POA: Diagnosis not present

## 2020-06-23 DIAGNOSIS — E669 Obesity, unspecified: Secondary | ICD-10-CM | POA: Diagnosis not present

## 2020-06-23 DIAGNOSIS — I129 Hypertensive chronic kidney disease with stage 1 through stage 4 chronic kidney disease, or unspecified chronic kidney disease: Secondary | ICD-10-CM | POA: Diagnosis not present

## 2020-06-23 DIAGNOSIS — N1831 Chronic kidney disease, stage 3a: Secondary | ICD-10-CM | POA: Diagnosis not present

## 2020-06-24 ENCOUNTER — Ambulatory Visit: Payer: Medicare PPO | Admitting: Cardiovascular Disease

## 2020-06-29 ENCOUNTER — Other Ambulatory Visit: Payer: Self-pay | Admitting: Nephrology

## 2020-06-29 DIAGNOSIS — N281 Cyst of kidney, acquired: Secondary | ICD-10-CM

## 2020-06-29 DIAGNOSIS — N1831 Chronic kidney disease, stage 3a: Secondary | ICD-10-CM

## 2020-06-29 DIAGNOSIS — R801 Persistent proteinuria, unspecified: Secondary | ICD-10-CM

## 2020-07-01 ENCOUNTER — Ambulatory Visit: Payer: Medicare PPO | Admitting: Physician Assistant

## 2020-07-01 ENCOUNTER — Encounter (HOSPITAL_COMMUNITY): Payer: Medicare PPO

## 2020-07-01 ENCOUNTER — Other Ambulatory Visit: Payer: Self-pay | Admitting: Internal Medicine

## 2020-07-01 ENCOUNTER — Other Ambulatory Visit: Payer: Self-pay

## 2020-07-01 VITALS — BP 122/84 | HR 80 | Temp 98.5°F | Resp 20 | Ht 69.5 in | Wt 306.7 lb

## 2020-07-01 DIAGNOSIS — R0989 Other specified symptoms and signs involving the circulatory and respiratory systems: Secondary | ICD-10-CM

## 2020-07-01 DIAGNOSIS — E118 Type 2 diabetes mellitus with unspecified complications: Secondary | ICD-10-CM

## 2020-07-01 DIAGNOSIS — R600 Localized edema: Secondary | ICD-10-CM | POA: Diagnosis not present

## 2020-07-01 DIAGNOSIS — I872 Venous insufficiency (chronic) (peripheral): Secondary | ICD-10-CM | POA: Diagnosis not present

## 2020-07-01 NOTE — Progress Notes (Addendum)
Office Note     CC:  follow up Requesting Provider:  [name], MD  HPI: Holly [name] is a 79 y.o. (July 17, 1941) female who presents for evaluation of left lower extremity edema and discomfort.  Patient was referred here by her primary care provide Dr. Ronnald Ramp after venous reflux study indicates superficial venous insufficiency of left and right lower extremity.  Patient complains of symptoms mostly on the left lower extremity.  She believes the edema of her legs comes and goes however has been more noticeable over the past month.  She is trying to elevate her legs when possible during the day.  She has worn compression in the past but has not worn them regularly.  She denies history of any vascular intervention, DVT, or venous ulcerations.  She has had her left knee replaced some years ago.  She denies tobacco use.  She has CKD and was told by her nephrologist that she is unable to take NSAIDs.  Past medical history also significant for insulin-dependent diabetes mellitus.  She denies claudication, rest pain, or nonhealing wounds of bilateral lower extremities.   Past Medical History:  Diagnosis Date  . Closed dislocation of shoulder    unspecified site  . DJD (degenerative joint disease) of knee    Right knee  . DM (diabetes mellitus) ([place])   . Hyperlipidemia   . Hypertension   . Obesity, morbid ([place])   . OSA (obstructive sleep apnea) 02/03/2017    Past Surgical History:  Procedure Laterality Date  . CHOLECYSTECTOMY  1989  . KNEE ARTHROSCOPY  2010   LEFT/ Dr Sharol Given  . TUBAL LIGATION  1979    Social History   Socioeconomic History  . Marital status: Divorced    Spouse name: Not on file  . Number of children: 2  . Years of education: Not on file  . Highest education level: Not on file  Occupational History  . Occupation: Assembly Engineer, manufacturing   . Occupation: Conservation officer, historic buildings: [employer]    Comment: RETIRED  Tobacco Use  . Smoking status:  Former Smoker    Packs/day: 0.50    Years: 25.00    Pack years: 12.50    Types: Cigarettes  . Smokeless tobacco: Never Used  Vaping Use  . Vaping Use: Never used  Substance and Sexual Activity  . Alcohol use: No    Alcohol/week: 0.0 standard drinks  . Drug use: No  . Sexual activity: Not Currently  Other Topics Concern  . Not on file  Social History Narrative   RETIRED   Married 2 yrs, currently divorced   Daughter born '65, Son born '69; 2 grandchildren - 1 here, 1 in [place] Determinants of Health   Financial Resource Strain:   . Difficulty of Paying Living Expenses:   Food Insecurity: No Landscape architect  . Worried About Charity fundraiser in the Last Year: Never true  . Ran Out of Food in the Last Year: Never true  Transportation Needs: No Transportation Needs  . Lack of Transportation (Medical): No  . Lack of Transportation (Non-Medical): No  Physical Activity:   . Days of Exercise per Week:   . Minutes of Exercise per Session:   Stress:   . Feeling of Stress :   Social Connections:   . Frequency of Communication with Friends and Family:   . Frequency of Social Gatherings with Friends and Family:   . Attends  Religious Services:   . Active Member of Clubs or Organizations:   . Attends Archivist Meetings:   Marland Kitchen Marital Status:   Intimate Partner Violence:   . Fear of Current or Ex-Partner:   . Emotionally Abused:   Marland Kitchen Physically Abused:   . Sexually Abused:     Family History  Problem Relation Age of Onset  . Heart disease Mother   . Kidney disease Father   . Aortic aneurysm Brother        Survived rupture    Current Outpatient Medications  Medication Sig Dispense Refill  . Accu-Chek FastClix Lancets MISC USE THREE TIMES DAILY 306 each 1  . ACCU-CHEK GUIDE test strip USE THREE TIMES DAILY 300 strip 1  . acetaminophen (TYLENOL) 500 MG tablet Take 1,000 mg by mouth every 6 (six) hours as needed for mild pain.    . Blood Glucose Monitoring  Suppl (ACCU-CHEK AVIVA PLUS) w/Device KIT Use to check blood sugars daily Dx E11.9 1 kit 0  . carvedilol (COREG) 12.5 MG tablet Take 1 tablet (12.5 mg total) by mouth 2 (two) times daily with a meal. 180 tablet 1  . cetirizine (ZYRTEC ALLERGY) 10 MG tablet Take 10 mg by mouth daily.      . Cholecalciferol 2000 units TABS Take 1 tablet (2,000 Units total) by mouth daily. 90 tablet 1  . Cyanocobalamin (VITAMIN B-12 CR PO) Take 1 tablet by mouth daily.    . indapamide (LOZOL) 1.25 MG tablet Take 1 tablet (1.25 mg total) by mouth daily. 90 tablet 0  . insulin glargine (LANTUS SOLOSTAR) 100 UNIT/ML Solostar Pen Inject 50 Units into the skin daily. 30 mL 1  . Insulin Pen Needle (NOVOFINE) 32G X 6 MM MISC 1 Act by Does not apply route daily. 100 each 3  . JARDIANCE 10 MG TABS tablet     . metFORMIN (GLUCOPHAGE XR) 750 MG 24 hr tablet Take 2 tablets (1,500 mg total) by mouth daily with breakfast. 180 tablet 0  . olmesartan (BENICAR) 20 MG tablet Take 1 tablet (20 mg total) by mouth daily. 90 tablet 0  . simvastatin (ZOCOR) 40 MG tablet TAKE 1 TABLET EVERY DAY 90 tablet 1   No current facility-administered medications for this visit.    Allergies  Allergen Reactions  . Nifedipine Other (See Comments)    Leg edema  . Amlodipine Other (See Comments)    edema  . Oxycodone     nausea  . Penicillins Hives and Other (See Comments)    fever     REVIEW OF SYSTEMS:   _0  denotes positive finding, _1  denotes negative finding Cardiac  Comments:  Chest pain or chest pressure:    Shortness of breath upon exertion:    Short of breath when lying flat:    Irregular heart rhythm:        Vascular    Pain in calf, thigh, or hip brought on by ambulation:    Pain in feet at night that wakes you up from your sleep:     Blood clot in your veins:    Leg swelling:  x       Pulmonary    Oxygen at home:    Productive cough:     Wheezing:         Neurologic    Sudden weakness in arms or legs:      Sudden numbness in arms or legs:     Sudden onset of difficulty speaking or slurred  speech:    Temporary loss of vision in one eye:     Problems with dizziness:         Gastrointestinal    Blood in stool:     Vomited blood:         Genitourinary    Burning when urinating:     Blood in urine:        Psychiatric    Major depression:         Hematologic    Bleeding problems:    Problems with blood clotting too easily:        Skin    Rashes or ulcers:        Constitutional    Fever or chills:      PHYSICAL EXAMINATION:  Vitals:   07/01/20 1322  BP: 122/84  Pulse: 80  Resp: 20  Temp: 98.5 F (36.9 C)  TempSrc: Temporal  SpO2: 94%  Weight: (!) 306 lb 11.2 oz (139.1 kg)  Height: 5' 9.5" (1.765 m)    General:  WDWN in NAD; vital signs documented above Gait: Not observed HENT: WNL, normocephalic Pulmonary: normal non-labored breathing , without Rales, rhonchi,  wheezing Cardiac: regular HR Abdomen: soft, NT, no masses Skin: without rashes Vascular Exam/Pulses:  Right Left  Radial 2+ (normal) 2+ (normal)  DP absent absent  PT absent absent   Extremities: without ischemic changes, without Gangrene , without cellulitis; without open wounds; no venous skin changes noted; 2 areas of varicose veins in left medial lower leg; pitting edema to the level of the mid shin left lower extremity no impressive edema right lower extremity Musculoskeletal: no muscle wasting or atrophy  Neurologic: A&O X 3;  No focal weakness or paresthesias are detected Psychiatric:  The pt has Normal affect.   Non-Invasive Vascular Imaging:   Study was performed in May 2021 Right lower extremity negative for DVT Reflux at saphenofemoral junction and and calf however no reflux noted in GSV in the thigh Negative for deep reflux  Left lower extremity negative for DVT Reflux throughout greater saphenous vein except for saphenofemoral junction Negative for deep reflux    ASSESSMENT/PLAN::  79 y.o. female here for evaluation of edema and pain specifically of left lower extremity  -Reflux study performed in May of this year demonstrates superficial venous reflux of bilateral lower extremities however negative for deep venous reflux; despite reflux throughout her left greater saphenous vein there was none seen at the saphenofemoral junction -Patient was encouraged to continue compression stockings daily in addition to elevating her legs when possible during the day -Plan is to repeat bilateral venous reflux study in 6 months; we will also check ABIs at that time due to diminished pulses of bilateral lower extremities -Patient will call/return to office sooner if venous symptoms worsen   Dagoberto Ligas, PA-C [facility] [phone]  Clinic MD:   Oneida Alar

## 2020-07-02 ENCOUNTER — Ambulatory Visit
Admission: RE | Admit: 2020-07-02 | Discharge: 2020-07-02 | Disposition: A | Discharge status: Home / Self Care | Payer: Medicare PPO | Source: Ambulatory Visit | Attending: Nephrology | Admitting: Nephrology

## 2020-07-02 DIAGNOSIS — N183 Chronic kidney disease, stage 3 unspecified: Secondary | ICD-10-CM | POA: Diagnosis not present

## 2020-07-02 DIAGNOSIS — N281 Cyst of kidney, acquired: Secondary | ICD-10-CM

## 2020-07-02 DIAGNOSIS — N1831 Chronic kidney disease, stage 3a: Secondary | ICD-10-CM

## 2020-07-02 DIAGNOSIS — R801 Persistent proteinuria, unspecified: Secondary | ICD-10-CM

## 2020-07-02 DIAGNOSIS — R809 Proteinuria, unspecified: Secondary | ICD-10-CM | POA: Diagnosis not present

## 2020-07-06 ENCOUNTER — Other Ambulatory Visit: Payer: Self-pay | Admitting: *Deleted

## 2020-07-06 DIAGNOSIS — I872 Venous insufficiency (chronic) (peripheral): Secondary | ICD-10-CM

## 2020-07-07 ENCOUNTER — Ambulatory Visit: Payer: Medicare PPO | Admitting: Cardiovascular Disease

## 2020-07-10 ENCOUNTER — Ambulatory Visit: Payer: Medicare PPO | Admitting: Cardiovascular Disease

## 2020-07-10 ENCOUNTER — Telehealth: Payer: Self-pay | Admitting: Internal Medicine

## 2020-07-10 MED ORDER — SIMVASTATIN 40 MG PO TABS
40.0000 mg | ORAL_TABLET | Freq: Every day | ORAL | 1 refills | Status: DC
Start: 2020-07-10 — End: 2020-09-03

## 2020-07-10 NOTE — Telephone Encounter (Signed)
   1.Medication Requested:simvastatin (ZOCOR) 40 MG tablet  2. Pharmacy (Name, [facility], [place], [place]   3. On Med List: yes  4. Last Visit with PCP: 05/05/20  5. Next visit date with PCP: n/a   Agent: Please be advised that RX refills may take up to 3 business days. We ask that you follow-up with your pharmacy.

## 2020-07-10 NOTE — Telephone Encounter (Signed)
erx sent as requested.  

## 2020-07-27 ENCOUNTER — Other Ambulatory Visit: Payer: Self-pay

## 2020-07-27 NOTE — Patient Outreach (Signed)
[facility] [facility]) Care Management  07/27/2020  VARSHA KNOCK [birth year] [id number]   Telephone Assessment   Outreach attempt # 1 to patient. Spoke with patient who reported she was doing well-all things considered. She has been seeing renal and cardiology MD's often and go back next month. She is unsure of when next appt is with PCP-but will check on it. She reports that renal MD told her that her kidney "were functioning at 65%" and that she needed to keep it like that. Patient reports that she is trying to eat better and make lifestyle changes to help this. She reports occasional BLE edema. She is wearing compression stockings as advised. Appetite remains WNL for patient. She continues to be independent with ADLs/IADLs as assists her brother who lives with her. Patient reports she is adhering to COVID-19 safety guidelines and plans to get booster vaccine when it is her time. She denies any RN CM needs or concerns at this time.      Plan: RN CM discussed with patient next outreach within the month of Nov. Patient gave verbal consent and in agreement with RN CM follow up and timeframe. Patient aware that they may contact RN CM sooner for any issues or concerns.   Enzo Montgomery, RN,BSN,CCM [place] Management Telephonic Care Management Coordinator Direct Phone: [phone] Toll Free: [phone] Fax: [phone]

## 2020-08-17 NOTE — Progress Notes (Deleted)
Cardiology Office Note:   Date:  08/17/2020  NAME:  Holly [name]    MRN: [id number] DOB:  10-28-1941   PCP:  [name], MD  Cardiologist:  No primary care provider on file.  Electrophysiologist:  None   Referring MD: [name], MD   No chief complaint on file. ***  History of Present Illness:   Holly [name] is a 78 y.o. female with a hx of obesity, HTN, CKD, DM, OSA HLD who presents for follow-up of venous insufficiency. Recently evaluated for LE edema. BNP normal. Vascular US with venous insufficiency.   Problem List 1. HTN 2. CKD 3 3. Diabetes -A1c 8.7 4. HLD -T chol 190, HDL 63, LDL 95, TG 160 5. OSA 6. Venous Insufficiency   Past Medical History: Past Medical History:  Diagnosis Date  . Closed dislocation of shoulder    unspecified site  . DJD (degenerative joint disease) of knee    Right knee  . DM (diabetes mellitus) ([place])   . Hyperlipidemia   . Hypertension   . Obesity, morbid ([place])   . OSA (obstructive sleep apnea) 02/03/2017    Past Surgical History: Past Surgical History:  Procedure Laterality Date  . CHOLECYSTECTOMY  1989  . KNEE ARTHROSCOPY  2010   LEFT/ Dr Sharol Given  . TUBAL LIGATION  1979    Current Medications: No outpatient medications have been marked as taking for the 08/20/20 encounter (Appointment) with O'Neal, Cassie Freer, MD.     Allergies:    Nifedipine, Amlodipine, Oxycodone, and Penicillins   Social History: Social History   Socioeconomic History  . Marital status: Divorced    Spouse name: Not on file  . Number of children: 2  . Years of education: Not on file  . Highest education level: Not on file  Occupational History  . Occupation: Assembly Engineer, manufacturing   . Occupation: Conservation officer, historic buildings: [employer]    Comment: RETIRED  Tobacco Use  . Smoking status: Former Smoker    Packs/day: 0.50    Years: 25.00    Pack years: 12.50    Types: Cigarettes  . Smokeless tobacco: Never  Used  Vaping Use  . Vaping Use: Never used  Substance and Sexual Activity  . Alcohol use: No    Alcohol/week: 0.0 standard drinks  . Drug use: No  . Sexual activity: Not Currently  Other Topics Concern  . Not on file  Social History Narrative   RETIRED   Married 38 yrs, currently divorced   Daughter born '65, Son born '69; 2 grandchildren - 1 here, 1 in [place] Determinants of Health   Financial Resource Strain:   . Difficulty of Paying Living Expenses: Not on file  Food Insecurity: No Food Insecurity  . Worried About Charity fundraiser in the Last Year: Never true  . Ran Out of Food in the Last Year: Never true  Transportation Needs: No Transportation Needs  . Lack of Transportation (Medical): No  . Lack of Transportation (Non-Medical): No  Physical Activity:   . Days of Exercise per Week: Not on file  . Minutes of Exercise per Session: Not on file  Stress:   . Feeling of Stress : Not on file  Social Connections:   . Frequency of Communication with Friends and Family: Not on file  . Frequency of Social Gatherings with Friends and Family: Not on file  . Attends Religious Services: Not on  file  . Active Member of Clubs or Organizations: Not on file  . Attends Archivist Meetings: Not on file  . Marital Status: Not on file     Family History: The patient's ***family history includes Aortic aneurysm in her brother; Heart disease in her mother; Kidney disease in her father.  ROS:   All other ROS reviewed and negative. Pertinent positives noted in the HPI.     EKGs/Labs/Other Studies Reviewed:   The following studies were personally reviewed by me today:  EKG:  EKG is *** ordered today.  The ekg ordered today demonstrates ***, and was personally reviewed by me.   Venous Reflux Study 04/01/2020 Right:  - Venous reflux is noted in the right sapheno-femoral junction.  - Venous reflux is noted in the right greater saphenous vein in the calf.    Left:    - Venous reflux is noted in the left greater saphenous vein in the thigh.  - Venous reflux is noted in the left greater saphenous vein in the calf.     TTE 04/08/2020 1. Left ventricular ejection fraction, by estimation, is 60 to 65%. The  left ventricle has normal function. The left ventricle has no regional  wall motion abnormalities. There is mild left ventricular hypertrophy.  Left ventricular diastolic parameters  are indeterminate.  2. Right ventricular systolic function is normal. The right ventricular  size is mildly enlarged. Tricuspid regurgitation signal is inadequate for  assessing PA pressure.  3. Right atrial size was moderately dilated.  4. The mitral valve is normal in structure. No evidence of mitral valve  regurgitation.  5. The aortic valve is tricuspid. Aortic valve regurgitation is not  visualized. Mild aortic valve sclerosis is present, with no evidence of  aortic valve stenosis.  6. Aortic dilatation noted. There is dilatation of the ascending aorta  measuring 41 mm.  7. The inferior vena cava is normal in size with greater than 50%  respiratory variability, suggesting right atrial pressure of 3 mmHg.   Recent Labs: 05/05/2020: ALT 10; BUN 21; Creatinine, Ser 1.01; Hemoglobin 13.6; Platelets 192.0; Potassium 3.6; Pro B Natriuretic peptide (BNP) 25.0; Sodium 138; TSH 5.19   Recent Lipid Panel    Component Value Date/Time   CHOL 201 (H) 05/05/2020 0925   TRIG 126.0 05/05/2020 0925   HDL 72.20 05/05/2020 0925   CHOLHDL 3 05/05/2020 0925   VLDL 25.2 05/05/2020 0925   LDLCALC 104 (H) 05/05/2020 0925   LDLDIRECT 134.8 08/27/2010 1138    Physical Exam:   VS:  There were no vitals taken for this visit.   Wt Readings from Last 3 Encounters:  07/01/20 (!) 306 lb 11.2 oz (139.1 kg)  05/05/20 (!) 316 lb 12 oz (143.7 kg)  03/18/20 (!) 311 lb 6.4 oz (141.3 kg)    General: Well nourished, well developed, in no acute distress Heart: Atraumatic, normal  size  Eyes: PEERLA, EOMI  Neck: Supple, no JVD Endocrine: No thryomegaly Cardiac: Normal S1, S2; RRR; no murmurs, rubs, or gallops Lungs: Clear to auscultation bilaterally, no wheezing, rhonchi or rales  Abd: Soft, nontender, no hepatomegaly  Ext: No edema, pulses 2+ Musculoskeletal: No deformities, BUE and BLE strength normal and equal Skin: Warm and dry, no rashes   Neuro: Alert and oriented to person, place, time, and situation, CNII-XII grossly intact, no focal deficits  Psych: Normal mood and affect   ASSESSMENT:   Holly [name] is a 79 y.o. female who presents for the following: No  diagnosis found.  PLAN:   There are no diagnoses linked to this encounter.  Disposition: No follow-ups on file.  Medication Adjustments/Labs and Tests Ordered: Current medicines are reviewed at length with the patient today.  Concerns regarding medicines are outlined above.  No orders of the defined types were placed in this encounter.  No orders of the defined types were placed in this encounter.   There are no Patient Instructions on file for this visit.   Time Spent with Patient: I have spent a total of *** minutes with patient reviewing hospital notes, telemetry, EKGs, labs and examining the patient as well as establishing an assessment and plan that was discussed with the patient.  > 50% of time was spent in direct patient care.  Signed, Addison Naegeli. Audie Box, [place]  [address] [place], [place] [address] [phone]  08/17/2020 1:55 PM

## 2020-08-19 ENCOUNTER — Other Ambulatory Visit: Payer: Self-pay | Admitting: Internal Medicine

## 2020-08-19 DIAGNOSIS — R600 Localized edema: Secondary | ICD-10-CM

## 2020-08-19 DIAGNOSIS — E118 Type 2 diabetes mellitus with unspecified complications: Secondary | ICD-10-CM

## 2020-08-19 DIAGNOSIS — I10 Essential (primary) hypertension: Secondary | ICD-10-CM

## 2020-08-20 ENCOUNTER — Ambulatory Visit: Payer: Medicare PPO | Admitting: Cardiovascular Disease

## 2020-08-20 DIAGNOSIS — N1831 Chronic kidney disease, stage 3a: Secondary | ICD-10-CM | POA: Diagnosis not present

## 2020-08-20 LAB — BASIC METABOLIC PANEL WITH GFR
BUN: 20 (ref 4–21)
CO2: 26 — AB (ref 13–22)
Chloride: 106 (ref 99–108)
Creatinine: 1 (ref 0.5–1.1)
Glucose: 141
Potassium: 4.3 (ref 3.4–5.3)
Sodium: 140 (ref 137–147)

## 2020-08-20 LAB — COMPREHENSIVE METABOLIC PANEL WITH GFR: Calcium: 9.7 (ref 8.7–10.7)

## 2020-08-26 DIAGNOSIS — I129 Hypertensive chronic kidney disease with stage 1 through stage 4 chronic kidney disease, or unspecified chronic kidney disease: Secondary | ICD-10-CM | POA: Diagnosis not present

## 2020-08-26 DIAGNOSIS — N281 Cyst of kidney, acquired: Secondary | ICD-10-CM | POA: Diagnosis not present

## 2020-08-26 DIAGNOSIS — R600 Localized edema: Secondary | ICD-10-CM | POA: Diagnosis not present

## 2020-08-26 DIAGNOSIS — R809 Proteinuria, unspecified: Secondary | ICD-10-CM | POA: Diagnosis not present

## 2020-08-26 DIAGNOSIS — N2581 Secondary hyperparathyroidism of renal origin: Secondary | ICD-10-CM | POA: Diagnosis not present

## 2020-08-26 DIAGNOSIS — N1831 Chronic kidney disease, stage 3a: Secondary | ICD-10-CM | POA: Diagnosis not present

## 2020-08-31 NOTE — Progress Notes (Signed)
Cardiology Office Note:   Date:  09/03/2020  NAME:  Holly [name]    MRN: [id number] DOB:  09-27-1941   PCP:  [name], MD  Cardiologist:  No primary care provider on file.   Referring MD: [name], MD   Chief Complaint  Patient presents with   Leg Swelling   History of Present Illness:   Holly [name] is a 79 y.o. female with a hx of obesity, venous insufficiency, HTN, CKD 3 who presents for follow-up of LE edema. Echo unremarkable. BNP normal. IVC collapsing. Venous reflux noted on [place].  Symptoms have improved with watching her salt intake as well as wearing compression stockings.  Her swelling is gone down.  She does have right greater than left swelling but her ultrasounds were negative for DVT.  She has no appreciable edema on exam today.  Her most recent A1c is 7.2.  Cholesterol level not at goal.  Would likely benefit from high intensity statin.  Her blood pressure is much better today as well.  130/79.  She is on Coreg as well as olmesartan.  She is lost about 20 pounds since we first met.  Overall seems to be doing well.  She is on Jardiance which has cardiac protection in the setting of diabetes.  She reports she has no chest pain or shortness of breath.  She is starting to a little more activity.  Overall doing remarkably well.  Problem List 1. HTN 2. CKD 3 3. Diabetes -A1c 7.2 4. HLD -T chol 201, HDL 72, LDL 104, triglycerides 126 5. OSA 6. Venous insufficiency   Past Medical History: Past Medical History:  Diagnosis Date   Closed dislocation of shoulder    unspecified site   DJD (degenerative joint disease) of knee    Right knee   DM (diabetes mellitus) ([place])    Hyperlipidemia    Hypertension    Obesity, morbid ([place])    OSA (obstructive sleep apnea) 02/03/2017    Past Surgical History: Past Surgical History:  Procedure Laterality Date   CHOLECYSTECTOMY  1989   KNEE ARTHROSCOPY  2010   LEFT/ Dr Sharol Given   TUBAL LIGATION  1979     Current Medications: Current Meds  Medication Sig   Accu-Chek FastClix Lancets MISC USE THREE TIMES DAILY   ACCU-CHEK GUIDE test strip USE THREE TIMES DAILY   acetaminophen (TYLENOL) 500 MG tablet Take 1,000 mg by mouth every 6 (six) hours as needed for mild pain.   Blood Glucose Monitoring Suppl (ACCU-CHEK AVIVA PLUS) w/Device KIT Use to check blood sugars daily Dx E11.9   carvedilol (COREG) 12.5 MG tablet Take 1 tablet (12.5 mg total) by mouth 2 (two) times daily with a meal.   cetirizine (ZYRTEC ALLERGY) 10 MG tablet Take 10 mg by mouth daily.     Cholecalciferol 2000 units TABS Take 1 tablet (2,000 Units total) by mouth daily.   Cyanocobalamin (VITAMIN B-12 CR PO) Take 1 tablet by mouth daily.   indapamide (LOZOL) 1.25 MG tablet Take 1 tablet by mouth once daily   insulin glargine (LANTUS SOLOSTAR) 100 UNIT/ML Solostar Pen Inject 50 Units into the skin daily.   Insulin Pen Needle (NOVOFINE) 32G X 6 MM MISC 1 Act by Does not apply route daily.   JARDIANCE 10 MG TABS tablet    metFORMIN (GLUCOPHAGE-XR) 750 MG 24 hr tablet TAKE 2 TABLETS (1,500 MG TOTAL) BY MOUTH DAILY WITH BREAKFAST.   olmesartan (BENICAR) 20 MG tablet Take 1 tablet  by mouth once daily   [DISCONTINUED] metFORMIN (GLUCOPHAGE XR) 750 MG 24 hr tablet Take 2 tablets (1,500 mg total) by mouth daily with breakfast.   [DISCONTINUED] simvastatin (ZOCOR) 40 MG tablet Take 1 tablet (40 mg total) by mouth daily.     Allergies:    Nifedipine, Amlodipine, Oxycodone, and Penicillins   Social History: Social History   Socioeconomic History   Marital status: Divorced    Spouse name: Not on file   Number of children: 2   Years of education: Not on file   Highest education level: Not on file  Occupational History   Occupation: Assembly Line    Comment: Secondary school teacher    Occupation: Conservation officer, historic buildings: [place]    Comment: RETIRED  Tobacco Use   Smoking status: Former Smoker     Packs/day: 0.50    Years: 25.00    Pack years: 12.50    Types: Cigarettes   Smokeless tobacco: Never Used  Scientific laboratory technician Use: Never used  Substance and Sexual Activity   Alcohol use: No    Alcohol/week: 0.0 standard drinks   Drug use: No   Sexual activity: Not Currently  Other Topics Concern   Not on file  Social History Narrative   RETIRED   Married 86 yrs, currently divorced   Daughter born '65, Son born '69; 2 grandchildren - 1 here, 1 in [place] Determinants of Health   Financial Resource Strain:    Difficulty of Paying Living Expenses: Not on file  Food Insecurity: No Food Insecurity   Worried About Charity fundraiser in the Last Year: Never true   Arboriculturist in the Last Year: Never true  Transportation Needs: No Transportation Needs   Lack of Transportation (Medical): No   Lack of Transportation (Non-Medical): No  Physical Activity:    Days of Exercise per Week: Not on file   Minutes of Exercise per Session: Not on file  Stress:    Feeling of Stress : Not on file  Social Connections:    Frequency of Communication with Friends and Family: Not on file   Frequency of Social Gatherings with Friends and Family: Not on file   Attends Religious Services: Not on file   Active Member of Clubs or Organizations: Not on file   Attends Archivist Meetings: Not on file   Marital Status: Not on file     Family History: The patient's family history includes Aortic aneurysm in her brother; Heart disease in her mother; Kidney disease in her father.  ROS:   All other ROS reviewed and negative. Pertinent positives noted in the HPI.     EKGs/Labs/Other Studies Reviewed:   The following studies were personally reviewed by me today:  TTE 04/08/2020  1. Left ventricular ejection fraction, by estimation, is 60 to 65%. The  left ventricle has normal function. The left ventricle has no regional  wall motion abnormalities. There is  mild left ventricular hypertrophy.  Left ventricular diastolic parameters  are indeterminate.  2. Right ventricular systolic function is normal. The right ventricular  size is mildly enlarged. Tricuspid regurgitation signal is inadequate for  assessing PA pressure.  3. Right atrial size was moderately dilated.  4. The mitral valve is normal in structure. No evidence of mitral valve  regurgitation.  5. The aortic valve is tricuspid. Aortic valve regurgitation is not  visualized. Mild aortic valve sclerosis is present, with no evidence of  aortic valve stenosis.  6. Aortic dilatation noted. There is dilatation of the ascending aorta  measuring 41 mm.  7. The inferior vena cava is normal in size with greater than 50%  respiratory variability, suggesting right atrial pressure of 3 mmHg.   Recent Labs: 05/05/2020: ALT 10; BUN 21; Creatinine, Ser 1.01; Hemoglobin 13.6; Platelets 192.0; Potassium 3.6; Pro B Natriuretic peptide (BNP) 25.0; Sodium 138; TSH 5.19   Recent Lipid Panel    Component Value Date/Time   CHOL 201 (H) 05/05/2020 0925   TRIG 126.0 05/05/2020 0925   HDL 72.20 05/05/2020 0925   CHOLHDL 3 05/05/2020 0925   VLDL 25.2 05/05/2020 0925   LDLCALC 104 (H) 05/05/2020 0925   LDLDIRECT 134.8 08/27/2010 1138    Physical Exam:   VS:  BP 130/79    Pulse 75    Ht _0  (1.753 m)    Wt (!) 306 lb 12.8 oz (139.2 kg)    SpO2 100%    BMI 45.31 kg/m    Wt Readings from Last 3 Encounters:  09/03/20 (!) 306 lb 12.8 oz (139.2 kg)  07/01/20 (!) 306 lb 11.2 oz (139.1 kg)  05/05/20 (!) 316 lb 12 oz (143.7 kg)    General: Well nourished, well developed, in no acute distress Heart: Atraumatic, normal size  Eyes: PEERLA, EOMI  Neck: Supple, no JVD Endocrine: No thryomegaly Cardiac: Normal S1, S2; RRR; no murmurs, rubs, or gallops Lungs: Clear to auscultation bilaterally, no wheezing, rhonchi or rales  Abd: Soft, nontender, no hepatomegaly  Ext: No edema, pulses  2+ Musculoskeletal: No deformities, BUE and BLE strength normal and equal Skin: Warm and dry, no rashes   Neuro: Alert and oriented to person, place, time, and situation, CNII-XII grossly intact, no focal deficits  Psych: Normal mood and affect   ASSESSMENT:   Holly [name] is a 79 y.o. female who presents for the following: 1. Leg edema   2. Essential hypertension   3. Mixed hyperlipidemia   4. Obesity, morbid, BMI 40.0-49.9 ([place])    PLAN:   1. Leg edema -BNP normal.  Echo with collapsible IVC.  Venous ultrasound with reflux noted.  She has noticed drastic improvement with wearing compression stockings as well as reducing her salt intake.  I think this is all venous insufficiency.  I would recommend to continue what she is doing as her blood pressure is controlled and her edema is almost nonexistent.  2. Essential hypertension -Well-controlled on Coreg and Benicar.  3. Mixed hyperlipidemia -Most recent LDL not at goal for her diabetes.  Change to Lipitor 40 mg daily.  I will follow-up this when I see her back in 1 year.  Her most recent LDL was 104.  I suspect she will get there with Lipitor.  4. Obesity, morbid, BMI 40.0-49.9 ([place]) -Continue with diet and exercise.  Disposition: Return in about 1 year (around 09/03/2021).  Medication Adjustments/Labs and Tests Ordered: Current medicines are reviewed at length with the patient today.  Concerns regarding medicines are outlined above.  No orders of the defined types were placed in this encounter.  Meds ordered this encounter  Medications   atorvastatin (LIPITOR) 40 MG tablet    Sig: Take 1 tablet (40 mg total) by mouth daily.    Dispense:  90 tablet    Refill:  3    Patient Instructions  Medication Instructions:  STOP simvastatin START atorvastatin (Lipitor) 40 mg daily  *If you need a refill on your cardiac medications before your  next appointment, please call your pharmacy*  Follow-Up: At [facility], you and  your health needs are our priority.  As part of our continuing mission to provide you with exceptional heart care, we have created designated Provider Care Teams.  These Care Teams include your primary Cardiologist (physician) and Advanced Practice Providers (APPs -  Physician Assistants and Nurse Practitioners) who all work together to provide you with the care you need, when you need it.  We recommend signing up for the patient portal called "MyChart".  Sign up information is provided on this After Visit Summary.  MyChart is used to connect with patients for Virtual Visits (Telemedicine).  Patients are able to view lab/test results, encounter notes, upcoming appointments, etc.  Non-urgent messages can be sent to your provider as well.   To learn more about what you can do with MyChart, go to [email or web address].    Your next appointment:   12 month(s)  The format for your next appointment:   In Person  Provider:   Eleonore Chiquito, MD        Time Spent with Patient: I have spent a total of 25 minutes with patient reviewing hospital notes, telemetry, EKGs, labs and examining the patient as well as establishing an assessment and plan that was discussed with the patient.  > 50% of time was spent in direct patient care.  Signed, Addison Naegeli. Audie Box, [place]  [address] [place], [place] [address] [phone]  09/03/2020 10:19 AM

## 2020-09-03 ENCOUNTER — Other Ambulatory Visit: Payer: Self-pay

## 2020-09-03 ENCOUNTER — Ambulatory Visit: Payer: Medicare PPO | Admitting: Cardiovascular Disease

## 2020-09-03 ENCOUNTER — Encounter: Payer: Self-pay | Admitting: Cardiovascular Disease

## 2020-09-03 VITALS — BP 130/79 | HR 75 | Ht 69.0 in | Wt 306.8 lb

## 2020-09-03 DIAGNOSIS — E782 Mixed hyperlipidemia: Secondary | ICD-10-CM | POA: Diagnosis not present

## 2020-09-03 DIAGNOSIS — I10 Essential (primary) hypertension: Secondary | ICD-10-CM | POA: Diagnosis not present

## 2020-09-03 DIAGNOSIS — R600 Localized edema: Secondary | ICD-10-CM | POA: Diagnosis not present

## 2020-09-03 MED ORDER — ATORVASTATIN CALCIUM 40 MG PO TABS: 40.0000 mg | ORAL_TABLET | Freq: Every day | ORAL | 3 refills | Status: DC

## 2020-09-03 NOTE — Patient Instructions (Signed)
Medication Instructions:  STOP simvastatin START atorvastatin (Lipitor) 40 mg daily  *If you need a refill on your cardiac medications before your next appointment, please call your pharmacy*  Follow-Up: At [facility], you and your health needs are our priority.  As part of our continuing mission to provide you with exceptional heart care, we have created designated Provider Care Teams.  These Care Teams include your primary Cardiologist (physician) and Advanced Practice Providers (APPs -  Physician Assistants and Nurse Practitioners) who all work together to provide you with the care you need, when you need it.  We recommend signing up for the patient portal called "MyChart".  Sign up information is provided on this After Visit Summary.  MyChart is used to connect with patients for Virtual Visits (Telemedicine).  Patients are able to view lab/test results, encounter notes, upcoming appointments, etc.  Non-urgent messages can be sent to your provider as well.   To learn more about what you can do with MyChart, go to [email or web address].    Your next appointment:   12 month(s)  The format for your next appointment:   In Person  Provider:   Eleonore Chiquito, MD

## 2020-09-15 ENCOUNTER — Other Ambulatory Visit: Payer: Self-pay | Admitting: Internal Medicine

## 2020-09-15 DIAGNOSIS — E118 Type 2 diabetes mellitus with unspecified complications: Secondary | ICD-10-CM

## 2020-09-22 ENCOUNTER — Other Ambulatory Visit: Payer: Self-pay

## 2020-09-22 NOTE — Patient Outreach (Signed)
[facility]) Care Management  09/22/2020  Holly [name] [birth year] [id number]   Telephone Assessment    Outreach attempt to patient. No answer at present.    Plan: RN CM will make quarterly outreach attempt to patient within the month of Dec if no return call from patient.  Enzo Montgomery, RN,BSN,CCM [facility] Telephonic Care Management Coordinator Direct Phone: [phone] Toll Free: [phone] Fax: [phone]

## 2020-09-24 ENCOUNTER — Ambulatory Visit: Payer: Self-pay

## 2020-09-29 DIAGNOSIS — Z1231 Encounter for screening mammogram for malignant neoplasm of breast: Secondary | ICD-10-CM | POA: Diagnosis not present

## 2020-10-14 ENCOUNTER — Other Ambulatory Visit: Payer: Self-pay | Admitting: Internal Medicine

## 2020-10-14 DIAGNOSIS — E118 Type 2 diabetes mellitus with unspecified complications: Secondary | ICD-10-CM

## 2020-10-26 ENCOUNTER — Other Ambulatory Visit: Payer: Self-pay

## 2020-10-26 NOTE — Patient Outreach (Signed)
[facility]) Care Management  10/26/2020  YARISSA REINING 1941/05/22 [id number]   Telephone Assessment    Unsuccessful outreach attempt to patient.      Plan: RN CM will make outreach attempt to patient within the month of Feb if no return call from patient.   Enzo Montgomery, RN,BSN,CCM [facility] [facility] Telephonic Care Management Coordinator Direct Phone: [phone] Toll Free: [phone] Fax: [phone]

## 2020-11-18 ENCOUNTER — Other Ambulatory Visit: Payer: Self-pay | Admitting: Internal Medicine

## 2020-11-18 DIAGNOSIS — R600 Localized edema: Secondary | ICD-10-CM

## 2020-11-18 DIAGNOSIS — I10 Essential (primary) hypertension: Secondary | ICD-10-CM

## 2020-11-20 ENCOUNTER — Other Ambulatory Visit: Payer: Self-pay | Admitting: Internal Medicine

## 2020-11-20 DIAGNOSIS — E118 Type 2 diabetes mellitus with unspecified complications: Secondary | ICD-10-CM

## 2020-12-04 ENCOUNTER — Telehealth: Payer: Self-pay | Admitting: Internal Medicine

## 2020-12-04 DIAGNOSIS — E118 Type 2 diabetes mellitus with unspecified complications: Secondary | ICD-10-CM

## 2020-12-04 NOTE — Telephone Encounter (Signed)
Patient called and said she needed a new prescription for metFORMIN (GLUCOPHAGE-XR) 750 MG 24 hr tablet. It can be sent to [place], [place]. She said she ran out today.

## 2020-12-04 NOTE — Telephone Encounter (Signed)
Refill denied, Pt is due for an appointment.

## 2020-12-11 MED ORDER — METFORMIN HCL ER 750 MG PO TB24: 1500.0000 mg | ORAL_TABLET | Freq: Every day | ORAL | 0 refills | Status: DC

## 2020-12-11 NOTE — Telephone Encounter (Signed)
   Patient calling upset because she has no Metformin remaining for over a week. Requesting short supply be sent until she can be seen 02/04  [place], [place].

## 2020-12-11 NOTE — Telephone Encounter (Signed)
Short supply sent. Must keep OV for additional refills as stated in sig.

## 2020-12-18 ENCOUNTER — Other Ambulatory Visit: Payer: Self-pay

## 2020-12-18 ENCOUNTER — Ambulatory Visit: Payer: Medicare PPO | Admitting: Internal Medicine

## 2020-12-18 ENCOUNTER — Encounter: Payer: Self-pay | Admitting: Internal Medicine

## 2020-12-18 VITALS — BP 144/86 | HR 82 | Temp 98.6°F | Resp 16 | Ht 69.0 in | Wt 307.0 lb

## 2020-12-18 DIAGNOSIS — E118 Type 2 diabetes mellitus with unspecified complications: Secondary | ICD-10-CM

## 2020-12-18 DIAGNOSIS — I10 Essential (primary) hypertension: Secondary | ICD-10-CM | POA: Diagnosis not present

## 2020-12-18 DIAGNOSIS — I110 Hypertensive heart disease with heart failure: Secondary | ICD-10-CM | POA: Diagnosis not present

## 2020-12-18 DIAGNOSIS — H6121 Impacted cerumen, right ear: Secondary | ICD-10-CM | POA: Insufficient documentation

## 2020-12-18 LAB — POCT GLYCOSYLATED HEMOGLOBIN (HGB A1C): Hemoglobin A1C: 8.2 % — AB (ref 4.0–5.6)

## 2020-12-18 MED ORDER — CARVEDILOL 12.5 MG PO TABS: 12.5000 mg | ORAL_TABLET | Freq: Two times a day (BID) | ORAL | 1 refills | Status: DC

## 2020-12-18 MED ORDER — EMPAGLIFLOZIN 25 MG PO TABS: 25.0000 mg | ORAL_TABLET | Freq: Every day | ORAL | 1 refills | Status: DC

## 2020-12-18 MED ORDER — OLMESARTAN MEDOXOMIL 20 MG PO TABS: 20.0000 mg | ORAL_TABLET | Freq: Every day | ORAL | 1 refills | Status: DC

## 2020-12-18 MED ORDER — METFORMIN HCL ER 750 MG PO TB24: 1500.0000 mg | ORAL_TABLET | Freq: Every day | ORAL | 1 refills | Status: DC

## 2020-12-18 MED ORDER — LANTUS SOLOSTAR 100 UNIT/ML ~~LOC~~ SOPN: 50.0000 [IU] | PEN_INJECTOR | Freq: Every day | SUBCUTANEOUS | 0 refills | Status: DC

## 2020-12-18 NOTE — Progress Notes (Signed)
Patient consent obtained. Irrigation with water and peroxide performed. Full view of tympanic membranes after procedure.  Patient tolerated procedure well.   

## 2020-12-18 NOTE — Patient Instructions (Signed)
Type 2 Diabetes Mellitus, Diagnosis, Adult Type 2 diabetes (type 2 diabetes mellitus) is a long-term, or chronic, disease. In type 2 diabetes, one or both of these problems may be present:  The pancreas does not make enough of a hormone called insulin.  Cells in the body do not respond properly to insulin that the body makes (insulin resistance). Normally, insulin allows blood sugar (glucose) to enter cells in the body. The cells use glucose for energy. Insulin resistance or lack of insulin causes excess glucose to build up in the blood instead of going into cells. This causes high blood glucose (hyperglycemia).  What are the causes? The exact cause of type 2 diabetes is not known. What increases the risk? The following factors may make you more likely to develop this condition:  Having a family member with type 2 diabetes.  Being overweight or obese.  Being inactive (sedentary).  Having been diagnosed with insulin resistance.  Having a history of prediabetes, diabetes when you were pregnant (gestational diabetes), or polycystic ovary syndrome (PCOS). What are the signs or symptoms? In the early stage of this condition, you may not have symptoms. Symptoms develop slowly and may include:  Increased thirst or hunger.  Increased urination.  Unexplained weight loss.  Tiredness (fatigue) or weakness.  Vision changes, such as blurry vision.  Dark patches on the skin. How is this diagnosed? This condition is diagnosed based on your symptoms, your medical history, a physical exam, and your blood glucose level. Your blood glucose may be checked with one or more of the following blood tests:  A fasting blood glucose (FBG) test. You will not be allowed to eat (you will fast) for 8 hours or longer before a blood sample is taken.  A random blood glucose test. This test checks blood glucose at any time of day regardless of when you ate.  An A1C (hemoglobin A1C) blood test. This test  provides information about blood glucose levels over the previous 2-3 months.  An oral glucose tolerance test (OGTT). This test measures your blood glucose at two times: ? After fasting. This is your baseline blood glucose level. ? Two hours after drinking a beverage that contains glucose. You may be diagnosed with type 2 diabetes if:  Your fasting blood glucose level is 126 mg/dL (7.0 mmol/L) or higher.  Your random blood glucose level is 200 mg/dL (11.1 mmol/L) or higher.  Your A1C level is 6.5% or higher.  Your oral glucose tolerance test result is higher than 200 mg/dL (11.1 mmol/L). These blood tests may be repeated to confirm your diagnosis.   How is this treated? Your treatment may be managed by a specialist called an endocrinologist. Type 2 diabetes may be treated by following instructions from your health care provider about:  Making dietary and lifestyle changes. These may include: ? Following a personalized nutrition plan that is developed by a registered dietitian. ? Exercising regularly. ? Finding ways to manage stress.  Checking your blood glucose level as often as told.  Taking diabetes medicines or insulin daily. This helps to keep your blood glucose levels in the healthy range.  Taking medicines to help prevent complications from diabetes. Medicines may include: ? Aspirin. ? Medicine to lower cholesterol. ? Medicine to control blood pressure. Your health care provider will set treatment goals for you. Your goals will be based on your age, other medical conditions you have, and how you respond to diabetes treatment. Generally, the goal of treatment is to maintain the   following blood glucose levels:  Before meals: 80-130 mg/dL (4.4-7.2 mmol/L).  After meals: below 180 mg/dL (10 mmol/L).  A1C level: less than 7%. Follow these instructions at home: Questions to ask your health care provider Consider asking the following questions:  Should I meet with a certified  diabetes care and education specialist?  What diabetes medicines do I need, and when should I take them?  What equipment will I need to manage my diabetes at home?  How often do I need to check my blood glucose?  Where can I find a support group for people with diabetes?  What number can I call if I have questions?  When is my next appointment? General instructions  Take over-the-counter and prescription medicines only as told by your health care provider.  Keep all follow-up visits as told by your health care provider. This is important. Where to find more information  American Diabetes Association (ADA): www.diabetes.org  American Association of Diabetes Care and Education Specialists (ADCES): www.diabeteseducator.org  International Diabetes Federation (IDF): www.idf.org Contact a health care provider if:  Your blood glucose is at or above 240 mg/dL (13.3 mmol/L) for 2 days in a row.  You have been sick or have had a fever for 2 days or longer, and you are not getting better.  You have any of the following problems for more than 6 hours: ? You cannot eat or drink. ? You have nausea and vomiting. ? You have diarrhea. Get help right away if:  You have severe hypoglycemia. This means your blood glucose is lower than 54 mg/dL (3.0 mmol/L).  You become confused or you have trouble thinking clearly.  You have difficulty breathing.  You have moderate or large ketone levels in your urine. These symptoms may represent a serious problem that is an emergency. Do not wait to see if the symptoms will go away. Get medical help right away. Call your local emergency services (911 in the [place] Do not drive yourself to the hospital. Summary  Type 2 diabetes (type 2 diabetes mellitus) is a long-term, or chronic, disease. In type 2 diabetes, the pancreas does not make enough of a hormone called insulin, or cells in the body do not respond properly to insulin that the body makes (insulin  resistance).  This condition is treated by making dietary and lifestyle changes and taking diabetes medicines or insulin.  Your health care provider will set treatment goals for you. Your goals will be based on your age, other medical conditions you have, and how you respond to diabetes treatment.  Keep all follow-up visits as told by your health care provider. This is important. This information is not intended to replace advice given to you by your health care provider. Make sure you discuss any questions you have with your health care provider. Document Revised: 05/27/2020 Document Reviewed: 05/27/2020 Elsevier Patient Education  2021 Elsevier Inc.  

## 2020-12-18 NOTE — Progress Notes (Signed)
Subjective:  Patient ID: Holly [name], female    DOB: 04-11-1941  Age: 80 y.o. MRN: [id number]  CC: Hypertension and Diabetes  This visit occurred during the SARS-CoV-2 public health emergency.  Safety protocols were in place, including screening questions prior to the visit, additional usage of staff PPE, and extensive cleaning of exam room while observing appropriate contact time as indicated for disinfecting solutions.    HPI Holly [name] presents for f/up -  She complains of a one month hx of pain and decreased hearing on the right side.  Outpatient Medications Prior to Visit  Medication Sig Dispense Refill  . Accu-Chek FastClix Lancets MISC USE THREE TIMES DAILY 306 each 1  . ACCU-CHEK GUIDE test strip USE THREE TIMES DAILY 300 strip 1  . acetaminophen (TYLENOL) 500 MG tablet Take 1,000 mg by mouth every 6 (six) hours as needed for mild pain.    . Blood Glucose Monitoring Suppl (ACCU-CHEK AVIVA PLUS) w/Device KIT Use to check blood sugars daily Dx E11.9 1 kit 0  . cetirizine (ZYRTEC) 10 MG tablet Take 10 mg by mouth daily.    . Cholecalciferol 2000 units TABS Take 1 tablet (2,000 Units total) by mouth daily. 90 tablet 1  . Cyanocobalamin (VITAMIN B-12 CR PO) Take 1 tablet by mouth daily.    . indapamide (LOZOL) 1.25 MG tablet Take 1 tablet by mouth once daily 90 tablet 0  . Insulin Pen Needle (NOVOFINE) 32G X 6 MM MISC 1 Act by Does not apply route daily. 100 each 3  . carvedilol (COREG) 12.5 MG tablet Take 1 tablet (12.5 mg total) by mouth 2 (two) times daily with a meal. 180 tablet 1  . JARDIANCE 10 MG TABS tablet     . LANTUS SOLOSTAR 100 UNIT/ML Solostar Pen INJECT 50 UNITS SUBCUTANEOUSLY DAILY. 45 mL 0  . metFORMIN (GLUCOPHAGE-XR) 750 MG 24 hr tablet Take 2 tablets (1,500 mg total) by mouth daily with breakfast. MUST KEEP APPOINTMENT FOR ADDITIONAL REFILLS 30 tablet 0  . olmesartan (BENICAR) 20 MG tablet Take 1 tablet by mouth once daily 90 tablet 0  . atorvastatin  (LIPITOR) 40 MG tablet Take 1 tablet (40 mg total) by mouth daily. 90 tablet 3   No facility-administered medications prior to visit.    ROS Review of Systems  Constitutional: Negative.  Negative for chills, diaphoresis, fatigue and fever.  HENT: Positive for ear pain and hearing loss. Negative for facial swelling and sore throat.   Eyes: Negative.   Respiratory: Negative for cough, chest tightness, shortness of breath and wheezing.   Cardiovascular: Negative for chest pain, palpitations and leg swelling.  Gastrointestinal: Negative for abdominal pain, constipation, diarrhea, nausea and vomiting.  Endocrine: Negative for polydipsia, polyphagia and polyuria.  Genitourinary: Negative.  Negative for difficulty urinating and dysuria.  Musculoskeletal: Negative.  Negative for arthralgias.  Skin: Negative.  Negative for color change and rash.  Neurological: Negative.  Negative for dizziness and weakness.  Hematological: Negative for adenopathy. Does not bruise/bleed easily.  Psychiatric/Behavioral: Negative.     Objective:  BP (!) 144/86   Pulse 82   Temp 98.6 F (37 C) (Oral)   Resp 16   Ht '5\' 9"'  (1.753 m)   Wt (!) 307 lb (139.3 kg)   SpO2 98%   BMI 45.34 kg/m   BP Readings from Last 3 Encounters:  12/18/20 (!) 144/86  09/03/20 130/79  07/01/20 122/84    Wt Readings from Last 3 Encounters:  12/18/20 (!) 307  lb (139.3 kg)  09/03/20 (!) 306 lb 12.8 oz (139.2 kg)  07/01/20 (!) 306 lb 11.2 oz (139.1 kg)    Physical Exam Vitals reviewed.  Constitutional:      Appearance: She is obese.  HENT:     Right Ear: Tympanic membrane, ear canal and external ear normal. Decreased hearing noted. There is impacted cerumen.     Left Ear: Tympanic membrane, ear canal and external ear normal. No decreased hearing noted. There is no impacted cerumen.     Nose: Nose normal.     Mouth/Throat:     Mouth: Mucous membranes are moist.  Eyes:     Conjunctiva/sclera: Conjunctivae normal.   Cardiovascular:     Rate and Rhythm: Normal rate and regular rhythm.     Heart sounds: No murmur heard.   Pulmonary:     Effort: Pulmonary effort is normal.     Breath sounds: No stridor. No wheezing, rhonchi or rales.  Abdominal:     General: Abdomen is protuberant. Bowel sounds are normal. There is no distension.     Palpations: Abdomen is soft. There is no hepatomegaly, splenomegaly or mass.     Tenderness: There is no abdominal tenderness.  Musculoskeletal:        General: Normal range of motion.     Cervical back: Neck supple.     Right lower leg: No edema.     Left lower leg: No edema.  Lymphadenopathy:     Cervical: No cervical adenopathy.  Skin:    General: Skin is warm and dry.  Neurological:     General: No focal deficit present.     Mental Status: She is alert.  Psychiatric:        Mood and Affect: Mood normal.        Behavior: Behavior normal.     Lab Results  Component Value Date   WBC 4.7 05/05/2020   HGB 13.6 05/05/2020   HCT 42.3 05/05/2020   PLT 192.0 05/05/2020   GLUCOSE 140 (H) 05/05/2020   CHOL 201 (H) 05/05/2020   TRIG 126.0 05/05/2020   HDL 72.20 05/05/2020   LDLDIRECT 134.8 08/27/2010   LDLCALC 104 (H) 05/05/2020   ALT 10 05/05/2020   AST 14 05/05/2020   NA 140 08/20/2020   K 4.3 08/20/2020   CL 106 08/20/2020   CREATININE 1.0 08/20/2020   BUN 20 08/20/2020   CO2 26 (A) 08/20/2020   TSH 5.19 (H) 05/05/2020   HGBA1C 8.2 (A) 12/18/2020   MICROALBUR 88.5 (H) 05/05/2020    US RENAL  Result Date: 07/03/2020 CLINICAL DATA:  Stage III renal disease with proteinuria EXAM: RENAL / URINARY TRACT ULTRASOUND COMPLETE COMPARISON:  08/08/2014 FINDINGS: Right Kidney: Renal measurements: 11.7 x 4.2 x 7.0 cm. = volume: 182 mL. Echogenicity within normal limits. No mass or hydronephrosis visualized. Left Kidney: Renal measurements: 12.0 x 5.8 x 6.9 cm = volume: 252 mL. No mass lesion or hydronephrosis is noted. 3.6 cm simple cyst is noted in the lower  pole of the left kidney relatively stable in appearance from the prior exam. Bladder: Appears normal for degree of bladder distention. Other: None. IMPRESSION: Simple cyst in the left kidney. No other focal abnormality is noted. Electronically Signed   By: Inez Catalina M.D.   On: 07/03/2020 08:12    Assessment & Plan:   Briannia was seen today for hypertension and diabetes.  Diagnoses and all orders for this visit:  Hearing loss due to cerumen impaction, right- Symptoms  resolved after the cerumen was removed.  Type II diabetes mellitus with manifestations ([place])- Her A1C is up to 8.2%. I will increase the dose of the SGLT-2 inh. -     metFORMIN (GLUCOPHAGE-XR) 750 MG 24 hr tablet; Take 2 tablets (1,500 mg total) by mouth daily with breakfast. -     olmesartan (BENICAR) 20 MG tablet; Take 1 tablet (20 mg total) by mouth daily. -     HM Diabetes Foot Exam -     POCT glycosylated hemoglobin (Hb A1C) -     empagliflozin (JARDIANCE) 25 MG TABS tablet; Take 1 tablet (25 mg total) by mouth daily before breakfast. -     Amb Referral to Nutrition and Diabetic Education -     Consult to [facility] -     insulin glargine (LANTUS SOLOSTAR) 100 UNIT/ML Solostar Pen; Inject 50 Units into the skin daily.  Malignant hypertension- Her BP is well controlled. -     carvedilol (COREG) 12.5 MG tablet; Take 1 tablet (12.5 mg total) by mouth 2 (two) times daily with a meal. -     olmesartan (BENICAR) 20 MG tablet; Take 1 tablet (20 mg total) by mouth daily.  LVH (left ventricular hypertrophy) due to hypertensive disease, with heart failure ([place])- BP is well controlled and she has a normal volume status. -     carvedilol (COREG) 12.5 MG tablet; Take 1 tablet (12.5 mg total) by mouth 2 (two) times daily with a meal.   I have discontinued [name]. [place]'s Jardiance. I have also changed her metFORMIN, olmesartan, and Lantus SoloStar. Additionally, I am having her start on empagliflozin. Lastly, I am  having her maintain her cetirizine, Cyanocobalamin (VITAMIN B-12 CR PO), Cholecalciferol, Accu-Chek Aviva Plus, Accu-Chek Guide, Accu-Chek FastClix Lancets, acetaminophen, NovoFine, atorvastatin, indapamide, and carvedilol.  Meds ordered this encounter  Medications  . metFORMIN (GLUCOPHAGE-XR) 750 MG 24 hr tablet    Sig: Take 2 tablets (1,500 mg total) by mouth daily with breakfast.    Dispense:  90 tablet    Refill:  1  . carvedilol (COREG) 12.5 MG tablet    Sig: Take 1 tablet (12.5 mg total) by mouth 2 (two) times daily with a meal.    Dispense:  180 tablet    Refill:  1  . olmesartan (BENICAR) 20 MG tablet    Sig: Take 1 tablet (20 mg total) by mouth daily.    Dispense:  90 tablet    Refill:  1  . empagliflozin (JARDIANCE) 25 MG TABS tablet    Sig: Take 1 tablet (25 mg total) by mouth daily before breakfast.    Dispense:  90 tablet    Refill:  1  . insulin glargine (LANTUS SOLOSTAR) 100 UNIT/ML Solostar Pen    Sig: Inject 50 Units into the skin daily.    Dispense:  45 mL    Refill:  0     Follow-up: Return in about 3 months (around 03/17/2021).  Scarlette Calico, MD

## 2020-12-21 ENCOUNTER — Other Ambulatory Visit: Payer: Self-pay

## 2020-12-21 ENCOUNTER — Other Ambulatory Visit: Payer: Self-pay | Admitting: Internal Medicine

## 2020-12-21 ENCOUNTER — Telehealth: Payer: Self-pay | Admitting: Internal Medicine

## 2020-12-21 DIAGNOSIS — H6981 Other specified disorders of Eustachian tube, right ear: Secondary | ICD-10-CM | POA: Insufficient documentation

## 2020-12-21 NOTE — Patient Outreach (Signed)
[name]) Care [name]  12/21/2020  Holly [name] Jun 18, 1941 [id number]   Telephone Assessment  RN CM received referral from PCP office on 12/21/2020 for "poorly controlled DM." Patient already active with [facility] but have been unsuccessful with reaching patient for past several months. Voicemail message received from patient returning RN CM call. Return call placed to patient. Spoke with patient. She reports that she has bene busy with multiple medical appts lately and apologized for not being able to be reached. She is agreeable to continued RN CM education and support with chronic conditions.   patient continues to reside in her home. She remains independent with ADLs/IADLs. No recent falls. She saw PCP last week. Patient states she had lab work done and is aware that MD concerned about her A1C level going up to 8.2. Patient admits that she might have "over done it during the holidays" with sugar consumption. She states that she is trying to do better. She is aware of diabetic diet and voices she will try to adhere better. She states she does not drink any sodas. She drinks primarily water and occasional cranberry juice.  Medications Reviewed Today    Reviewed by Hayden Pedro, RN (Registered Nurse) on 12/21/20 at 1208  Med List Status: <None>  Medication Order Taking? Sig Documenting Provider Last Dose Status Informant  Accu-Chek FastClix Lancets MISC [id number] No USE THREE TIMES DAILY Holly Lima, MD Taking Active   ACCU-CHEK GUIDE test strip [id number] No USE THREE TIMES DAILY Holly Lima, MD Taking Active   acetaminophen (TYLENOL) 500 MG tablet [id number] No Take 1,000 mg by mouth every 6 (six) hours as needed for mild pain. [provider] Taking Active Self  atorvastatin (LIPITOR) 40 MG tablet [id number]  Take 1 tablet (40 mg total) by mouth daily. [name], MD  Expired 12/02/20 2359   Blood Glucose Monitoring Suppl (ACCU-CHEK  AVIVA PLUS) w/Device KIT [id number] No Use to check blood sugars daily Dx E11.9 Holly Lima, MD Taking Active   carvedilol (COREG) 12.5 MG tablet [id number]  Take 1 tablet (12.5 mg total) by mouth 2 (two) times daily with a meal. Holly Lima, MD  Active   cetirizine (ZYRTEC) 10 MG tablet [id number] No Take 10 mg by mouth daily. [provider] Taking Active Self           Med Note Holly [name], [facility]   Tue May 28, 2019 11:41 AM) Takes as needed  Cholecalciferol 2000 units TABS [id number] No Take 1 tablet (2,000 Units total) by mouth daily. Holly Lima, MD Taking Active            Med Note Holly [name] Jun 19, 2019  3:23 PM)    Cyanocobalamin (VITAMIN B-12 CR PO) [id number] No Take 1 tablet by mouth daily. [provider] Taking Active Self  empagliflozin (JARDIANCE) 25 MG TABS tablet [id number]  Take 1 tablet (25 mg total) by mouth daily before breakfast. Holly Lima, MD  Active   indapamide (LOZOL) 1.25 MG tablet [id number] No Take 1 tablet by mouth once daily Holly Lima, MD Taking Active   insulin glargine (LANTUS SOLOSTAR) 100 UNIT/ML Solostar Pen [id number]  Inject 50 Units into the skin daily. Holly Lima, MD  Active   Insulin Pen Needle (NOVOFINE) 32G X 6 MM MISC [id number] No 1 Act by Does not apply route daily. Holly Lima, MD Taking Active   metFORMIN (GLUCOPHAGE-XR) 750 MG  24 hr tablet [id number]  Take 2 tablets (1,500 mg total) by mouth daily with breakfast. Holly Lima, MD  Active   olmesartan (BENICAR) 20 MG tablet [id number]  Take 1 tablet (20 mg total) by mouth daily. Holly Lima, MD  Active           Goals Addressed              This Visit's Progress   .  (THN)Monitor and Manage My Blood Sugar-Diabetes Type 2 (pt-stated)        Timeframe:  Long-Range Goal Priority:  High Start Date:  12/21/2020                           Expected End Date:   May 2022                    Follow Up Date May 2022   - check blood sugar at  prescribed times - check blood sugar if I feel it is too high or too low - enter blood sugar readings and medication or insulin into daily log - take the blood sugar log to all doctor visits    Why is this important?    Checking your blood sugar at home helps to keep it from getting very high or very low.   Writing the results in a diary or log helps the doctor know how to care for you.   Your blood sugar log should have the time, date and the results.   Also, write down the amount of insulin or other medicine that you take.   Other information, like what you ate, exercise done and how you were feeling, will also be helpful.     Notes:  12/21/2020-Patient admits to over indulgence during the holidays. She is aware of diabetic diet restrictions and voiced she will do better at adherence. Most recent A1C level 8.2(Feb 2022)    .  [facility] Eye Exam-Diabetes Type 2 (pt-stated)        Timeframe:  Long-Range Goal Priority:  High Start Date:  12/21/2020                           Expected End Date:    07/13/2021                  Follow Up Date May 2022   - schedule appointment with eye doctor    Why is this important?    Eye check-ups are important when you have diabetes.   Vision loss can be prevented.    Notes:  12/21/2020-Patient needs to scheduled eye exam-states she will follow up.            Plan: RN CM discussed with patient next outreach within the month of May . Patient gave verbal consent and in agreement with RN CM follow up and timeframe. Patient aware that they may contact RN CM sooner for any issues or concerns. RN CM reviewed goals and plan of care with patient. Patient in agreement.  RN CM will send quarterly update to PCP.   Enzo Montgomery, RN,BSN,CCM [name] Telephonic Care [name] Coordinator Direct Phone: [phone] Toll Free: [phone] Fax: [phone]

## 2020-12-21 NOTE — Patient Outreach (Addendum)
[facility]) Care Management  12/21/2020  TANYLA STEGE 1940/11/18 [id number]   Telephone Assessment    RN CM received referral from PCP office on 12/21/2020 for "poorly controlled DM." Patient already active with [facility] but have bene unsuccessful with reaching patient for past several months. Outreach attempt to patient. No answer at present. RN CM let HIPAA compliant voicemail message along with contact info.       Plan: RN CM will make outreach attempt within 3-4weeks if no return call from patient.  RN CM will send unsuccessful outreach letter to patient.   Enzo Montgomery, RN,BSN,CCM [place] [facility] Telephonic Care Management Coordinator Direct Phone: [phone] Toll Free: [phone] Fax: [phone]

## 2020-12-21 NOTE — Telephone Encounter (Signed)
Patient called and said after getting her ears flushed on 12/18/20 she said that there is no change and was wondering if there was anything else that could be done. Please advise.

## 2020-12-21 NOTE — Telephone Encounter (Signed)
Pt stated that she is still having an issue with fullness and hearing. Please advise.

## 2020-12-21 NOTE — Telephone Encounter (Signed)
What are her symptoms?  TJ

## 2020-12-22 ENCOUNTER — Ambulatory Visit: Payer: Self-pay

## 2021-01-11 ENCOUNTER — Other Ambulatory Visit: Payer: Self-pay

## 2021-01-11 ENCOUNTER — Ambulatory Visit (INDEPENDENT_AMBULATORY_CARE_PROVIDER_SITE_OTHER): Payer: Medicare PPO | Admitting: Otolaryngology

## 2021-01-11 VITALS — Temp 97.2°F

## 2021-01-11 DIAGNOSIS — L299 Pruritus, unspecified: Secondary | ICD-10-CM | POA: Diagnosis not present

## 2021-01-11 DIAGNOSIS — H61891 Other specified disorders of right external ear: Secondary | ICD-10-CM | POA: Diagnosis not present

## 2021-01-11 NOTE — Progress Notes (Signed)
HPI: Holly [name] is a 80 y.o. female who presents is referred by her PCP Dr. Ronnald Ramp for evaluation of decreased hearing in the right ear.  She stated that she woke up with blocked hearing in the right ear.  She had her ears washed out and feels like she may hear a little bit better.  She also complains of itching that she has had in her ears on both sides for 3 to 4 months..  Past Medical History:  Diagnosis Date  . Closed dislocation of shoulder    unspecified site  . DJD (degenerative joint disease) of knee    Right knee  . DM (diabetes mellitus) ([place])   . Hyperlipidemia   . Hypertension   . Obesity, morbid ([place])   . OSA (obstructive sleep apnea) 02/03/2017   Past Surgical History:  Procedure Laterality Date  . CHOLECYSTECTOMY  1989  . KNEE ARTHROSCOPY  2010   LEFT/ Dr Sharol Given  . TUBAL LIGATION  1979   Social History   Socioeconomic History  . Marital status: Divorced    Spouse name: Not on file  . Number of children: 2  . Years of education: Not on file  . Highest education level: Not on file  Occupational History  . Occupation: Assembly Engineer, manufacturing   . Occupation: Conservation officer, historic buildings: [employer]    Comment: RETIRED  Tobacco Use  . Smoking status: Former Smoker    Packs/day: 0.50    Years: 25.00    Pack years: 12.50    Types: Cigarettes  . Smokeless tobacco: Never Used  Vaping Use  . Vaping Use: Never used  Substance and Sexual Activity  . Alcohol use: No    Alcohol/week: 0.0 standard drinks  . Drug use: No  . Sexual activity: Not Currently  Other Topics Concern  . Not on file  Social History Narrative   RETIRED   Married 19 yrs, currently divorced   Daughter born '65, Son born '69; 2 grandchildren - 1 here, 1 in [place] Determinants of Health   Financial Resource Strain: Not on file  Food Insecurity: No Food Insecurity  . Worried About Charity fundraiser in the Last Year: Never true  . Ran Out of Food in the Last  Year: Never true  Transportation Needs: No Transportation Needs  . Lack of Transportation (Medical): No  . Lack of Transportation (Non-Medical): No  Physical Activity: Not on file  Stress: Not on file  Social Connections: Not on file   Family History  Problem Relation Age of Onset  . Heart disease Mother   . Kidney disease Father   . Aortic aneurysm Brother        Survived rupture   Allergies  Allergen Reactions  . Nifedipine Other (See Comments)    Leg edema  . Amlodipine Other (See Comments)    edema  . Oxycodone     nausea  . Penicillins Hives and Other (See Comments)    fever   Prior to Admission medications   Medication Sig Start Date End Date Taking? Authorizing Provider  Accu-Chek FastClix Lancets MISC USE THREE TIMES DAILY 11/21/19   [name], MD  ACCU-CHEK GUIDE test strip USE THREE TIMES DAILY 11/21/19   [name], MD  acetaminophen (TYLENOL) 500 MG tablet Take 1,000 mg by mouth every 6 (six) hours as needed for mild pain.    [provider]  atorvastatin (LIPITOR) 40  MG tablet Take 1 tablet (40 mg total) by mouth daily. 09/03/20 12/02/20  O'NealCassie Freer, MD  Blood Glucose Monitoring Suppl (ACCU-CHEK AVIVA PLUS) w/Device KIT Use to check blood sugars daily Dx E11.9 04/01/19   [name], MD  carvedilol (COREG) 12.5 MG tablet Take 1 tablet (12.5 mg total) by mouth 2 (two) times daily with a meal. 12/18/20   [name], MD  cetirizine (ZYRTEC) 10 MG tablet Take 10 mg by mouth daily.    [provider]  Cholecalciferol 2000 units TABS Take 1 tablet (2,000 Units total) by mouth daily. 04/04/18   [name], MD  Cyanocobalamin (VITAMIN B-12 CR PO) Take 1 tablet by mouth daily.    [provider]  empagliflozin (JARDIANCE) 25 MG TABS tablet Take 1 tablet (25 mg total) by mouth daily before breakfast. 12/18/20   [name], MD  indapamide (LOZOL) 1.25 MG tablet Take 1 tablet by mouth once daily 11/18/20   [name],  MD  insulin glargine (LANTUS SOLOSTAR) 100 UNIT/ML Solostar Pen Inject 50 Units into the skin daily. 12/18/20   [name], MD  Insulin Pen Needle (NOVOFINE) 32G X 6 MM MISC 1 Act by Does not apply route daily. 04/07/20   [name], MD  metFORMIN (GLUCOPHAGE-XR) 750 MG 24 hr tablet Take 2 tablets (1,500 mg total) by mouth daily with breakfast. 12/18/20   [name], MD  olmesartan (BENICAR) 20 MG tablet Take 1 tablet (20 mg total) by mouth daily. 12/18/20   [name], MD     Positive ROS: Otherwise negative  All other systems have been reviewed and were otherwise negative with the exception of those mentioned in the HPI and as above.  Physical Exam: Constitutional: Alert, well-appearing, no acute distress Ears: External ears without lesions or tenderness.  On exam of the ears she has a Q-tip stuck in the right ear canal that was removed with forceps in the office today.  After removing the Q-tip from her ear the TM was clear.  No signs of infection.  No significant wax buildup.  The left ear canal and left TM are clear.  No signs of infection. Nasal: External nose without lesions.. Clear nasal passages Oral: Lips and gums without lesions. Tongue and palate mucosa without lesions. Posterior oropharynx clear. Neck: No palpable adenopathy or masses Respiratory: Breathing comfortably  Skin: No facial/neck lesions or rash noted.  Removal of ear foreign body  Date/Time: 01/11/2021 3:51 PM Performed by: Rozetta Nunnery, MD Authorized by: Rozetta Nunnery, MD   Consent:    Consent obtained:  Verbal   Consent given by:  Patient Location:    Location:  Ear Procedure details:    Localization method:  Direct visualization and microscope   Removal mechanism:  Alligator forceps   Foreign bodies recovered:  1   Intact foreign body removal: yes   Comments:     On microscopic exam patient had a Q-tip stuck in the right ear canal that was removed with forceps.  The TM  itself was clear.    Assessment: Foreign body or Q-tip stuck in the right ear canal causing the hearing problems. Itching of the ears.  Plan: For itching of the ears recommended Diprolene 0.05% cream or lotion.  Apply this twice daily for 4 to 5 days. Cautioned her about not using Q-tips in the ear. She will follow-up as needed   Radene Journey, MD   CC:

## 2021-02-01 ENCOUNTER — Other Ambulatory Visit: Payer: Self-pay | Admitting: Internal Medicine

## 2021-02-01 ENCOUNTER — Telehealth: Payer: Self-pay | Admitting: Internal Medicine

## 2021-02-01 DIAGNOSIS — E118 Type 2 diabetes mellitus with unspecified complications: Secondary | ICD-10-CM

## 2021-02-01 DIAGNOSIS — I10 Essential (primary) hypertension: Secondary | ICD-10-CM

## 2021-02-01 DIAGNOSIS — R600 Localized edema: Secondary | ICD-10-CM

## 2021-02-01 MED ORDER — METFORMIN HCL ER 750 MG PO TB24: 1500.0000 mg | ORAL_TABLET | Freq: Every day | ORAL | 1 refills | Status: DC

## 2021-02-01 MED ORDER — OLMESARTAN MEDOXOMIL 20 MG PO TABS: 20.0000 mg | ORAL_TABLET | Freq: Every day | ORAL | 1 refills | Status: DC

## 2021-02-01 MED ORDER — INDAPAMIDE 1.25 MG PO TABS: 1.2500 mg | ORAL_TABLET | Freq: Every day | ORAL | 1 refills | Status: DC

## 2021-02-01 NOTE — Telephone Encounter (Signed)
1.Medication Requested: metFORMIN (GLUCOPHAGE-XR) 750 MG 24 hr tablet  olmesartan (BENICAR) 20 MG tablet  indapamide (LOZOL) 1.25 MG tablet    2. Pharmacy (Name, [place], [facility]): [facility] - [place], [address]  3. On Med List: yes   4. Last Visit with PCP: 2.4.22  5. Next visit date with PCP: n/a    Agent: Please be advised that RX refills may take up to 3 business days. We ask that you follow-up with your pharmacy.

## 2021-03-01 DIAGNOSIS — D631 Anemia in chronic kidney disease: Secondary | ICD-10-CM | POA: Diagnosis not present

## 2021-03-01 DIAGNOSIS — N2581 Secondary hyperparathyroidism of renal origin: Secondary | ICD-10-CM | POA: Diagnosis not present

## 2021-03-01 DIAGNOSIS — E669 Obesity, unspecified: Secondary | ICD-10-CM | POA: Diagnosis not present

## 2021-03-01 DIAGNOSIS — N181 Chronic kidney disease, stage 1: Secondary | ICD-10-CM | POA: Diagnosis not present

## 2021-03-01 DIAGNOSIS — E785 Hyperlipidemia, unspecified: Secondary | ICD-10-CM | POA: Diagnosis not present

## 2021-03-01 DIAGNOSIS — N1831 Chronic kidney disease, stage 3a: Secondary | ICD-10-CM | POA: Diagnosis not present

## 2021-03-01 DIAGNOSIS — I129 Hypertensive chronic kidney disease with stage 1 through stage 4 chronic kidney disease, or unspecified chronic kidney disease: Secondary | ICD-10-CM | POA: Diagnosis not present

## 2021-03-01 DIAGNOSIS — R809 Proteinuria, unspecified: Secondary | ICD-10-CM | POA: Diagnosis not present

## 2021-03-01 DIAGNOSIS — E1122 Type 2 diabetes mellitus with diabetic chronic kidney disease: Secondary | ICD-10-CM | POA: Diagnosis not present

## 2021-03-01 LAB — BASIC METABOLIC PANEL
BUN: 27 — AB (ref 4–21)
CO2: 26 — AB (ref 13–22)
Chloride: 100 (ref 99–108)
Creatinine: 1.4 — AB (ref 0.5–1.1)
Glucose: 166
Potassium: 3.9 (ref 3.4–5.3)
Sodium: 140 (ref 137–147)

## 2021-03-01 LAB — MICROALBUMIN, URINE: Microalb, Ur: 445.8

## 2021-03-01 LAB — COMPREHENSIVE METABOLIC PANEL
Albumin: 4.2 (ref 3.5–5.0)
Calcium: 10 (ref 8.7–10.7)

## 2021-03-10 ENCOUNTER — Other Ambulatory Visit: Payer: Self-pay | Admitting: Internal Medicine

## 2021-03-10 DIAGNOSIS — E118 Type 2 diabetes mellitus with unspecified complications: Secondary | ICD-10-CM

## 2021-03-19 ENCOUNTER — Other Ambulatory Visit: Payer: Self-pay

## 2021-03-19 NOTE — Patient Outreach (Signed)
[facility]) Care Management  03/19/2021  Holly [name] April 20, 1941 [id number]   Telephone Assessment    Outreach attempt # 1 to patient. Spoke with patient who reported she was just getting up and requested call back at later time.   Plan: RN CM will make outreach attempt to patient later today per patient request.  Enzo Montgomery, RN,BSN,CCM [place] [facility] Telephonic Care Management Coordinator Direct Phone: [phone] Toll Free: [phone] Fax: [phone]

## 2021-03-19 NOTE — Patient Outreach (Signed)
Holly [name]) Care Management  03/19/2021  TYA Holly [name] [birth year] [id number]   Telephone Assessment Annual Assessment   Successful outreach back to patient per her previous request. She reports that tings have bene going fairly well. She continues to reside in her hoe along with her brother. She is independent with ADLs/IADLs. She denies ay recent falls. She is using cane to help her get around. She reports chronic back issues and states sh plans to alk with PCP about getting referral to another ortho MD for further eval. She denies any pain at present at rest but primarily when she ambulates longer than short distances. She reports she continues to be followed by renal MD and PCP regarding her DM and kidney functioning. She denies any RN CM needs or concerns at this time.    Medications Reviewed Today    Reviewed by Macy Mis, CMA (Certified Medical Assistant) on 01/11/21 at 20  Med List Status: <None>  Medication Order Taking? Sig Documenting Provider Last Dose Status Informant  Accu-Chek FastClix Lancets MISC [id number] No USE THREE TIMES DAILY [name], MD Taking Active   ACCU-CHEK GUIDE test strip [id number] No USE THREE TIMES DAILY [name], MD Taking Active   acetaminophen (TYLENOL) 500 MG tablet [id number] No Take 1,000 mg by mouth every 6 (six) hours as needed for mild pain. [provider] Taking Active Self  atorvastatin (LIPITOR) 40 MG tablet [id number]  Take 1 tablet (40 mg total) by mouth daily. [name], MD  Expired 12/02/20 2359   Blood Glucose Monitoring Suppl (ACCU-CHEK AVIVA PLUS) w/Device KIT [id number] No Use to check blood sugars daily Dx E11.9 [name], MD Taking Active   carvedilol (COREG) 12.5 MG tablet [id number]  Take 1 tablet (12.5 mg total) by mouth 2 (two) times daily with a meal. [name], MD  Active   cetirizine (ZYRTEC) 10 MG tablet [id number] No Take 10 mg by mouth daily. [provider] Taking Active Self           Med Note [name], [facility] D   Tue May 28, 2019 11:41 AM) Takes as needed  Cholecalciferol 2000 units TABS [id number] No Take 1 tablet (2,000 Units total) by mouth daily. [name], MD Taking Active            Med Note [name] Jun 19, 2019  3:23 PM)    Cyanocobalamin (VITAMIN B-12 CR PO) [id number] No Take 1 tablet by mouth daily. [provider] Taking Active Self  empagliflozin (JARDIANCE) 25 MG TABS tablet [id number]  Take 1 tablet (25 mg total) by mouth daily before breakfast. [name], MD  Active   indapamide (LOZOL) 1.25 MG tablet [id number] No Take 1 tablet by mouth once daily [name], MD Taking Active   insulin glargine (LANTUS SOLOSTAR) 100 UNIT/ML Solostar Pen [id number]  Inject 50 Units into the skin daily. [name], MD  Active   Insulin Pen Needle (NOVOFINE) 32G X 6 MM MISC [id number] No 1 Act by Does not apply route daily. [name], MD Taking Active   metFORMIN (GLUCOPHAGE-XR) 750 MG 24 hr tablet [id number]  Take 2 tablets (1,500 mg total) by mouth daily with breakfast. [name], MD  Active   olmesartan (BENICAR) 20 MG tablet [id number]  Take 1 tablet (20 mg total) by mouth daily. [name], MD  Active  Fall Risk  03/19/2021 12/21/2020 07/27/2020 05/22/2020 03/10/2020  Falls in the past year? '1 1 1 1 ' 0  Number falls in past yr: 0 0 0 0 0  Injury with Fall? 0 0 0 0 0  Risk for fall due to : Medication side effect;Impaired balance/gait Impaired balance/gait;Medication side effect Impaired balance/gait;Impaired mobility;Medication side effect;Impaired vision Impaired balance/gait;Impaired mobility;Medication side effect Impaired mobility;Impaired balance/gait  Risk for fall due to: Comment - - - - -  Follow up Falls evaluation completed;Education provided Falls evaluation completed;Education provided Falls evaluation completed;Education provided Falls evaluation  completed;Education provided Falls evaluation completed   Depression screen [facility] 2/9 03/19/2021 05/22/2020 01/16/2020 05/28/2019 05/03/2019  Decreased Interest 0 0 0 0 0  Down, Depressed, Hopeless 0 0 0 0 0  PHQ - 2 Score 0 0 0 0 0  Altered sleeping - - - - -  Tired, decreased energy - - - - -  Change in appetite - - - - -  Feeling bad or failure about yourself  - - - - -  Trouble concentrating - - - - -  Moving slowly or fidgety/restless - - - - -  Suicidal thoughts - - - - -  PHQ-9 Score - - - - -  Difficult doing work/chores - - - - -   SDOH Screenings   Alcohol Screen: Not on file  Depression (PHQ2-9): Low Risk   . PHQ-2 Score: 0  Financial Resource Strain: Not on file  Food Insecurity: No Food Insecurity  . Worried About Charity fundraiser in the Last Year: Never true  . Ran Out of Food in the Last Year: Never true  Housing: Not on file  Physical Activity: Not on file  Social Connections: Not on file  Stress: Not on file  Tobacco Use: Medium Risk  . Smoking Tobacco Use: Former Smoker  . Smokeless Tobacco Use: Never Used  Transportation Needs: No Transportation Needs  . Lack of Transportation (Medical): No  . Lack of Transportation (Non-Medical): No   Goals Addressed              This Visit's Progress   .  (THN)Monitor and Manage My Blood Sugar-Diabetes Type 2 (pt-stated)        Timeframe:  Long-Range Goal Priority:  High Start Date:  12/21/2020                           Expected End Date:  August 2022                    Follow Up Date August2022   - check blood sugar at prescribed times - check blood sugar if I feel it is too high or too low - enter blood sugar readings and medication or insulin into daily log - take the blood sugar log to all doctor visits    Why is this important?    Checking your blood sugar at home helps to keep it from getting very high or very low.   Writing the results in a diary or log helps the doctor know how to care for you.   Your  blood sugar log should have the time, date and the results.   Also, write down the amount of insulin or other medicine that you take.   Other information, like what you ate, exercise done and how you were feeling, will also be helpful.     Notes:  12/21/2020-Patient admits to  over indulgence during the holidays. She is aware of diabetic diet restrictions and voiced she will do better at adherence. Most recent A1C level 8.2(Feb 2022)  03/19/2021 Patient has started on Jardiance. States she has noticed that cbgs have been slightly better. Blood sugar this morning 110. She is being monitored for her kidneys to ensure no SEs from diabetes.    Marland Kitchen  [facility] Eye Exam-Diabetes Type 2 (pt-stated)        Timeframe:  Long-Range Goal Priority:  High Start Date:  12/21/2020                           Expected End Date:    07/13/2021                  Follow Up Date August 2022   - schedule appointment with eye doctor    Why is this important?    Eye check-ups are important when you have diabetes.   Vision loss can be prevented.    Notes:  12/21/2020-Patient needs to scheduled eye exam-states she will follow up.  03/19/2021-Patient states she is not due for annual until later this year.         Plan: RN CM discussed with patient next outreach within the month of August. Patient gave verbal consent and in agreement with RN CM follow up and timeframe. Patient aware that they may contact RN CM sooner for any issues or concerns. RN CM reviewed goals and plan of care with patient. Patient agrees to care plan and follow up. RN CM will send quarterly update to PCP.  Enzo Montgomery, RN,BSN,CCM [place] [facility] Telephonic Care Management Coordinator Direct Phone: [phone] Toll Free: [phone] Fax: [phone]

## 2021-03-31 DIAGNOSIS — N1831 Chronic kidney disease, stage 3a: Secondary | ICD-10-CM | POA: Diagnosis not present

## 2021-04-02 ENCOUNTER — Other Ambulatory Visit: Payer: Self-pay

## 2021-04-02 ENCOUNTER — Encounter: Payer: Self-pay | Admitting: Internal Medicine

## 2021-04-02 ENCOUNTER — Ambulatory Visit: Payer: Medicare PPO | Admitting: Internal Medicine

## 2021-04-02 VITALS — BP 144/86 | HR 67 | Temp 98.4°F | Resp 16 | Ht 69.0 in | Wt 303.0 lb

## 2021-04-02 DIAGNOSIS — E119 Type 2 diabetes mellitus without complications: Secondary | ICD-10-CM | POA: Diagnosis not present

## 2021-04-02 DIAGNOSIS — Z794 Long term (current) use of insulin: Secondary | ICD-10-CM | POA: Diagnosis not present

## 2021-04-02 DIAGNOSIS — R801 Persistent proteinuria, unspecified: Secondary | ICD-10-CM

## 2021-04-02 DIAGNOSIS — E118 Type 2 diabetes mellitus with unspecified complications: Secondary | ICD-10-CM | POA: Diagnosis not present

## 2021-04-02 DIAGNOSIS — I70211 Atherosclerosis of native arteries of extremities with intermittent claudication, right leg: Secondary | ICD-10-CM | POA: Diagnosis not present

## 2021-04-02 DIAGNOSIS — R600 Localized edema: Secondary | ICD-10-CM

## 2021-04-02 DIAGNOSIS — I110 Hypertensive heart disease with heart failure: Secondary | ICD-10-CM | POA: Diagnosis not present

## 2021-04-02 DIAGNOSIS — I10 Essential (primary) hypertension: Secondary | ICD-10-CM | POA: Diagnosis not present

## 2021-04-02 DIAGNOSIS — N1831 Chronic kidney disease, stage 3a: Secondary | ICD-10-CM

## 2021-04-02 LAB — POCT GLYCOSYLATED HEMOGLOBIN (HGB A1C): Hemoglobin A1C: 7.3 % — AB (ref 4.0–5.6)

## 2021-04-02 MED ORDER — INDAPAMIDE 1.25 MG PO TABS: 1.2500 mg | ORAL_TABLET | Freq: Every day | ORAL | 1 refills | Status: DC

## 2021-04-02 MED ORDER — DEXCOM G6 TRANSMITTER MISC: 1.0000 | Freq: Every day | 5 refills | Status: DC

## 2021-04-02 MED ORDER — CARVEDILOL 12.5 MG PO TABS
12.5000 mg | ORAL_TABLET | Freq: Two times a day (BID) | ORAL | 1 refills | Status: DC
Start: 2021-04-02 — End: 2021-09-20

## 2021-04-02 MED ORDER — GVOKE HYPOPEN 2-PACK 1 MG/0.2ML ~~LOC~~ SOAJ: 1.0000 | Freq: Every day | SUBCUTANEOUS | 5 refills | Status: DC | PRN

## 2021-04-02 MED ORDER — OLMESARTAN MEDOXOMIL 20 MG PO TABS: 20.0000 mg | ORAL_TABLET | Freq: Every day | ORAL | 1 refills | Status: DC

## 2021-04-02 MED ORDER — DEXCOM G6 SENSOR MISC: 1.0000 | Freq: Every day | 5 refills | Status: DC

## 2021-04-02 MED ORDER — DEXCOM G6 RECEIVER DEVI: 1.0000 | Freq: Every day | 5 refills | Status: DC

## 2021-04-02 MED ORDER — LANTUS SOLOSTAR 100 UNIT/ML ~~LOC~~ SOPN: 50.0000 [IU] | PEN_INJECTOR | Freq: Every day | SUBCUTANEOUS | 0 refills | Status: DC

## 2021-04-02 NOTE — Progress Notes (Signed)
Subjective:  Patient ID: Holly [name], female    DOB: [birth year]  Age: 80 y.o. MRN: [id number]  CC: Hypertension and Diabetes  This visit occurred during the SARS-CoV-2 public health emergency.  Safety protocols were in place, including screening questions prior to the visit, additional usage of staff PPE, and extensive cleaning of exam room while observing appropriate contact time as indicated for disinfecting solutions.    HPI Holly [name] presents for f/up -   She continues to complain of chronic low back pain and tells me she is status post 2 epidural steroid injections that did not help.  She tells me her blood pressure and blood sugar have been well controlled.  She denies any recent episodes of chest pain, diaphoresis, shortness of breath, or edema.  Outpatient Medications Prior to Visit  Medication Sig Dispense Refill  . Accu-Chek FastClix Lancets MISC USE THREE TIMES DAILY 306 each 1  . ACCU-CHEK GUIDE test strip USE THREE TIMES DAILY 300 strip 1  . acetaminophen (TYLENOL) 500 MG tablet Take 1,000 mg by mouth every 6 (six) hours as needed for mild pain.    . Blood Glucose Monitoring Suppl (ACCU-CHEK AVIVA PLUS) w/Device KIT Use to check blood sugars daily Dx E11.9 1 kit 0  . cetirizine (ZYRTEC) 10 MG tablet Take 10 mg by mouth daily.    . Cholecalciferol 2000 units TABS Take 1 tablet (2,000 Units total) by mouth daily. 90 tablet 1  . Cyanocobalamin (VITAMIN B-12 CR PO) Take 1 tablet by mouth daily.    . empagliflozin (JARDIANCE) 25 MG TABS tablet Take 1 tablet (25 mg total) by mouth daily before breakfast. 90 tablet 1  . Insulin Pen Needle (NOVOFINE) 32G X 6 MM MISC 1 Act by Does not apply route daily. 100 each 3  . metFORMIN (GLUCOPHAGE-XR) 750 MG 24 hr tablet TAKE 2 TABLETS BY MOUTH ONCE DAILY WITH BREAKFAST 90 tablet 0  . carvedilol (COREG) 12.5 MG tablet Take 1 tablet (12.5 mg total) by mouth 2 (two) times daily with a meal. 180 tablet 1  . indapamide (LOZOL) 1.25 MG  tablet Take 1 tablet (1.25 mg total) by mouth daily. 90 tablet 1  . insulin glargine (LANTUS SOLOSTAR) 100 UNIT/ML Solostar Pen Inject 50 Units into the skin daily. 45 mL 0  . olmesartan (BENICAR) 20 MG tablet Take 1 tablet (20 mg total) by mouth daily. 90 tablet 1  . atorvastatin (LIPITOR) 40 MG tablet Take 1 tablet (40 mg total) by mouth daily. 90 tablet 3   No facility-administered medications prior to visit.    ROS Review of Systems  Constitutional: Negative for chills, diaphoresis, fatigue and fever.  HENT: Negative.   Eyes: Negative.   Respiratory: Negative for cough, chest tightness, shortness of breath and wheezing.   Cardiovascular: Negative for chest pain, palpitations and leg swelling.  Gastrointestinal: Negative for abdominal pain, diarrhea and nausea.  Endocrine: Negative.  Negative for polydipsia, polyphagia and polyuria.  Genitourinary: Negative.  Negative for difficulty urinating.  Musculoskeletal: Positive for back pain.  Skin: Negative.   Neurological: Negative.  Negative for dizziness, weakness, light-headedness and headaches.  Hematological: Negative for adenopathy. Does not bruise/bleed easily.  Psychiatric/Behavioral: Negative.     Objective:  BP (!) 144/86 (BP Location: Left Arm, Patient Position: Sitting, Cuff Size: Large)   Pulse 67   Temp 98.4 F (36.9 C) (Oral)   Resp 16   Ht '5\' 9"'  (1.753 m)   Wt (!) 303 lb (137.4 kg)  SpO2 96%   BMI 44.75 kg/m   BP Readings from Last 3 Encounters:  04/02/21 (!) 144/86  12/18/20 (!) 144/86  09/03/20 130/79    Wt Readings from Last 3 Encounters:  04/02/21 (!) 303 lb (137.4 kg)  12/18/20 (!) 307 lb (139.3 kg)  09/03/20 (!) 306 lb 12.8 oz (139.2 kg)    Physical Exam Vitals reviewed.  Constitutional:      General: She is not in acute distress.    Appearance: She is obese.  HENT:     Nose: Nose normal.  Eyes:     General: No scleral icterus.    Conjunctiva/sclera: Conjunctivae normal.  Cardiovascular:      Rate and Rhythm: Normal rate and regular rhythm.     Heart sounds: No murmur heard.   Pulmonary:     Effort: Pulmonary effort is normal.     Breath sounds: No stridor. No wheezing, rhonchi or rales.  Abdominal:     General: Abdomen is protuberant. Bowel sounds are normal. There is no distension.     Palpations: Abdomen is soft. There is no hepatomegaly, splenomegaly or mass.     Tenderness: There is no abdominal tenderness.  Musculoskeletal:        General: Normal range of motion.     Cervical back: Neck supple.  Lymphadenopathy:     Cervical: No cervical adenopathy.  Skin:    General: Skin is warm and dry.  Neurological:     General: No focal deficit present.     Mental Status: She is alert.  Psychiatric:        Mood and Affect: Mood normal.        Behavior: Behavior normal.     Lab Results  Component Value Date   WBC 4.7 05/05/2020   HGB 13.6 05/05/2020   HCT 42.3 05/05/2020   PLT 192.0 05/05/2020   GLUCOSE 140 (H) 05/05/2020   CHOL 201 (H) 05/05/2020   TRIG 126.0 05/05/2020   HDL 72.20 05/05/2020   LDLDIRECT 134.8 08/27/2010   LDLCALC 104 (H) 05/05/2020   ALT 10 05/05/2020   AST 14 05/05/2020   NA 140 03/01/2021   K 3.9 03/01/2021   CL 100 03/01/2021   CREATININE 1.4 (A) 03/01/2021   BUN 27 (A) 03/01/2021   CO2 26 (A) 03/01/2021   TSH 5.19 (H) 05/05/2020   HGBA1C 7.3 (A) 04/02/2021   MICROALBUR 445.8 03/01/2021    US RENAL  Result Date: 07/03/2020 CLINICAL DATA:  Stage III renal disease with proteinuria EXAM: RENAL / URINARY TRACT ULTRASOUND COMPLETE COMPARISON:  08/08/2014 FINDINGS: Right Kidney: Renal measurements: 11.7 x 4.2 x 7.0 cm. = volume: 182 mL. Echogenicity within normal limits. No mass or hydronephrosis visualized. Left Kidney: Renal measurements: 12.0 x 5.8 x 6.9 cm = volume: 252 mL. No mass lesion or hydronephrosis is noted. 3.6 cm simple cyst is noted in the lower pole of the left kidney relatively stable in appearance from the prior exam.  Bladder: Appears normal for degree of bladder distention. Other: None. IMPRESSION: Simple cyst in the left kidney. No other focal abnormality is noted. Electronically Signed   By: Inez Catalina M.D.   On: 07/03/2020 08:12    Assessment & Plan:   Holly [name] was seen today for hypertension and diabetes.  Diagnoses and all orders for this visit:  Stage 3a chronic kidney disease ([place])- Her renal function is stable.  She is followed by nephrology.  Type II diabetes mellitus with manifestations ([place])- Her A1c is down  to 7.3%.  Her blood sugar is adequately well controlled. -     insulin glargine (LANTUS SOLOSTAR) 100 UNIT/ML Solostar Pen; Inject 50 Units into the skin daily. -     olmesartan (BENICAR) 20 MG tablet; Take 1 tablet (20 mg total) by mouth daily. -     Continuous Blood Gluc Receiver ([place]) DEVI; 1 Act by Does not apply route daily. -     Continuous Blood Gluc Transmit (DEXCOM G6 TRANSMITTER) MISC; 1 Act by Does not apply route daily. -     Continuous Blood Gluc Sensor (DEXCOM G6 SENSOR) MISC; 1 Act by Does not apply route daily. -     Glucagon (GVOKE HYPOPEN 2-PACK) 1 MG/0.2ML SOAJ; Inject 1 Act into the skin daily as needed. -     POCT glycosylated hemoglobin (Hb A1C)  Malignant hypertension- Her blood pressure is adequately well controlled. -     olmesartan (BENICAR) 20 MG tablet; Take 1 tablet (20 mg total) by mouth daily. -     indapamide (LOZOL) 1.25 MG tablet; Take 1 tablet (1.25 mg total) by mouth daily.  Persistent proteinuria  Essential hypertension -     indapamide (LOZOL) 1.25 MG tablet; Take 1 tablet (1.25 mg total) by mouth daily.  Bilateral leg edema- Improvement noted with the thiazide diuretic. -     indapamide (LOZOL) 1.25 MG tablet; Take 1 tablet (1.25 mg total) by mouth daily.  LVH (left ventricular hypertrophy) due to hypertensive disease, with heart failure ([place])- She has a normal volume status.  Atherosclerosis of native artery of right lower  extremity with intermittent claudication ([place])- She has no symptoms related to this.  Primary hypertension  Insulin-requiring or dependent type II diabetes mellitus ([place]) -     Continuous Blood Gluc Receiver ([place]) DEVI; 1 Act by Does not apply route daily. -     Continuous Blood Gluc Transmit (DEXCOM G6 TRANSMITTER) MISC; 1 Act by Does not apply route daily. -     Continuous Blood Gluc Sensor (DEXCOM G6 SENSOR) MISC; 1 Act by Does not apply route daily. -     Glucagon (GVOKE HYPOPEN 2-PACK) 1 MG/0.2ML SOAJ; Inject 1 Act into the skin daily as needed.  Other orders -     carvedilol (COREG) 12.5 MG tablet; Take 1 tablet (12.5 mg total) by mouth 2 (two) times daily with a meal.   I am having [name]. Elpidio Eric start on Dexcom G6 Receiver, Dexcom G6 Transmitter, Dexcom G6 Sensor, and Gvoke HypoPen 2-Pack. I am also having her maintain her cetirizine, Cyanocobalamin (VITAMIN B-12 CR PO), Cholecalciferol, Accu-Chek Aviva Plus, Accu-Chek Guide, Accu-Chek FastClix Lancets, acetaminophen, NovoFine, atorvastatin, empagliflozin, metFORMIN, Lantus SoloStar, carvedilol, olmesartan, and indapamide.  Meds ordered this encounter  Medications  . insulin glargine (LANTUS SOLOSTAR) 100 UNIT/ML Solostar Pen    Sig: Inject 50 Units into the skin daily.    Dispense:  45 mL    Refill:  0  . carvedilol (COREG) 12.5 MG tablet    Sig: Take 1 tablet (12.5 mg total) by mouth 2 (two) times daily with a meal.    Dispense:  180 tablet    Refill:  1  . olmesartan (BENICAR) 20 MG tablet    Sig: Take 1 tablet (20 mg total) by mouth daily.    Dispense:  90 tablet    Refill:  1  . indapamide (LOZOL) 1.25 MG tablet    Sig: Take 1 tablet (1.25 mg total) by mouth daily.  Dispense:  90 tablet    Refill:  1  . Continuous Blood Gluc Receiver (DEXCOM G6 RECEIVER) DEVI    Sig: 1 Act by Does not apply route daily.    Dispense:  1 each    Refill:  5  . Continuous Blood Gluc Transmit (DEXCOM G6 TRANSMITTER)  MISC    Sig: 1 Act by Does not apply route daily.    Dispense:  1 each    Refill:  5  . Continuous Blood Gluc Sensor (DEXCOM G6 SENSOR) MISC    Sig: 1 Act by Does not apply route daily.    Dispense:  1 each    Refill:  5  . Glucagon (GVOKE HYPOPEN 2-PACK) 1 MG/0.2ML SOAJ    Sig: Inject 1 Act into the skin daily as needed.    Dispense:  2 mL    Refill:  5     Follow-up: Return in about 6 months (around 10/03/2021).  Scarlette Calico, MD

## 2021-04-02 NOTE — Patient Instructions (Signed)
Type 2 Diabetes Mellitus, Diagnosis, Adult Type 2 diabetes (type 2 diabetes mellitus) is a long-term, or chronic, disease. In type 2 diabetes, one or both of these problems may be present:  The pancreas does not make enough of a hormone called insulin.  Cells in the body do not respond properly to insulin that the body makes (insulin resistance). Normally, insulin allows blood sugar (glucose) to enter cells in the body. The cells use glucose for energy. Insulin resistance or lack of insulin causes excess glucose to build up in the blood instead of going into cells. This causes high blood glucose (hyperglycemia).  What are the causes? The exact cause of type 2 diabetes is not known. What increases the risk? The following factors may make you more likely to develop this condition:  Having a family member with type 2 diabetes.  Being overweight or obese.  Being inactive (sedentary).  Having been diagnosed with insulin resistance.  Having a history of prediabetes, diabetes when you were pregnant (gestational diabetes), or polycystic ovary syndrome (PCOS). What are the signs or symptoms? In the early stage of this condition, you may not have symptoms. Symptoms develop slowly and may include:  Increased thirst or hunger.  Increased urination.  Unexplained weight loss.  Tiredness (fatigue) or weakness.  Vision changes, such as blurry vision.  Dark patches on the skin. How is this diagnosed? This condition is diagnosed based on your symptoms, your medical history, a physical exam, and your blood glucose level. Your blood glucose may be checked with one or more of the following blood tests:  A fasting blood glucose (FBG) test. You will not be allowed to eat (you will fast) for 8 hours or longer before a blood sample is taken.  A random blood glucose test. This test checks blood glucose at any time of day regardless of when you ate.  An A1C (hemoglobin A1C) blood test. This test  provides information about blood glucose levels over the previous 2-3 months.  An oral glucose tolerance test (OGTT). This test measures your blood glucose at two times: ? After fasting. This is your baseline blood glucose level. ? Two hours after drinking a beverage that contains glucose. You may be diagnosed with type 2 diabetes if:  Your fasting blood glucose level is 126 mg/dL (7.0 mmol/L) or higher.  Your random blood glucose level is 200 mg/dL (11.1 mmol/L) or higher.  Your A1C level is 6.5% or higher.  Your oral glucose tolerance test result is higher than 200 mg/dL (11.1 mmol/L). These blood tests may be repeated to confirm your diagnosis.   How is this treated? Your treatment may be managed by a specialist called an endocrinologist. Type 2 diabetes may be treated by following instructions from your health care provider about:  Making dietary and lifestyle changes. These may include: ? Following a personalized nutrition plan that is developed by a registered dietitian. ? Exercising regularly. ? Finding ways to manage stress.  Checking your blood glucose level as often as told.  Taking diabetes medicines or insulin daily. This helps to keep your blood glucose levels in the healthy range.  Taking medicines to help prevent complications from diabetes. Medicines may include: ? Aspirin. ? Medicine to lower cholesterol. ? Medicine to control blood pressure. Your health care provider will set treatment goals for you. Your goals will be based on your age, other medical conditions you have, and how you respond to diabetes treatment. Generally, the goal of treatment is to maintain the   following blood glucose levels:  Before meals: 80-130 mg/dL (4.4-7.2 mmol/L).  After meals: below 180 mg/dL (10 mmol/L).  A1C level: less than 7%. Follow these instructions at home: Questions to ask your health care provider Consider asking the following questions:  Should I meet with a certified  diabetes care and education specialist?  What diabetes medicines do I need, and when should I take them?  What equipment will I need to manage my diabetes at home?  How often do I need to check my blood glucose?  Where can I find a support group for people with diabetes?  What number can I call if I have questions?  When is my next appointment? General instructions  Take over-the-counter and prescription medicines only as told by your health care provider.  Keep all follow-up visits as told by your health care provider. This is important. Where to find more information  American Diabetes Association (ADA): www.diabetes.org  American Association of Diabetes Care and Education Specialists (ADCES): www.diabeteseducator.org  International Diabetes Federation (IDF): www.idf.org Contact a health care provider if:  Your blood glucose is at or above 240 mg/dL (13.3 mmol/L) for 2 days in a row.  You have been sick or have had a fever for 2 days or longer, and you are not getting better.  You have any of the following problems for more than 6 hours: ? You cannot eat or drink. ? You have nausea and vomiting. ? You have diarrhea. Get help right away if:  You have severe hypoglycemia. This means your blood glucose is lower than 54 mg/dL (3.0 mmol/L).  You become confused or you have trouble thinking clearly.  You have difficulty breathing.  You have moderate or large ketone levels in your urine. These symptoms may represent a serious problem that is an emergency. Do not wait to see if the symptoms will go away. Get medical help right away. Call your local emergency services (911 in the [place] Do not drive yourself to the hospital. Summary  Type 2 diabetes (type 2 diabetes mellitus) is a long-term, or chronic, disease. In type 2 diabetes, the pancreas does not make enough of a hormone called insulin, or cells in the body do not respond properly to insulin that the body makes (insulin  resistance).  This condition is treated by making dietary and lifestyle changes and taking diabetes medicines or insulin.  Your health care provider will set treatment goals for you. Your goals will be based on your age, other medical conditions you have, and how you respond to diabetes treatment.  Keep all follow-up visits as told by your health care provider. This is important. This information is not intended to replace advice given to you by your health care provider. Make sure you discuss any questions you have with your health care provider. Document Revised: 05/27/2020 Document Reviewed: 05/27/2020 Elsevier Patient Education  2021 Elsevier Inc.  

## 2021-04-19 DIAGNOSIS — M48062 Spinal stenosis, lumbar region with neurogenic claudication: Secondary | ICD-10-CM | POA: Diagnosis not present

## 2021-05-04 DIAGNOSIS — M47816 Spondylosis without myelopathy or radiculopathy, lumbar region: Secondary | ICD-10-CM | POA: Diagnosis not present

## 2021-05-10 ENCOUNTER — Other Ambulatory Visit: Payer: Self-pay | Admitting: Internal Medicine

## 2021-05-10 DIAGNOSIS — E118 Type 2 diabetes mellitus with unspecified complications: Secondary | ICD-10-CM

## 2021-05-19 ENCOUNTER — Telehealth: Payer: Self-pay | Admitting: Internal Medicine

## 2021-05-19 NOTE — Telephone Encounter (Signed)
   Patient requesting generic Jardiance be refilled. Patient states the brand name is $152 fofr a 30 day supply  Please send generic to [facility] for better cost

## 2021-05-24 NOTE — Telephone Encounter (Signed)
Patient has [employer] and reports copays for Jardiance and Lantus are cost prohibitive at this time.  Reviewed application process for [employer] patient assistance programs. Patient meets income/out of pocket spend criteria for the program. Patient will provide proof of income, out of pocket spend report, and will sign application. Will collaborate with prescriber Dr Ronnald Ramp for the provider portion of application. Once completed, application will be submitted via Fax   Pt will come to office to sign paperwork. Paperwork left in front office cabinet for patient to sign and return to PharmD.   Charlton Haws, [facility]

## 2021-05-24 NOTE — Telephone Encounter (Signed)
Patient may be eligible for free Jardiance through [facility] pt assistance, depending on household income.  Attempted to contact patient to discuss, unable to reach, LVM to return call to office.

## 2021-06-07 DIAGNOSIS — M48062 Spinal stenosis, lumbar region with neurogenic claudication: Secondary | ICD-10-CM | POA: Diagnosis not present

## 2021-06-08 ENCOUNTER — Telehealth: Payer: Self-pay

## 2021-06-08 NOTE — Telephone Encounter (Signed)
Called pt, LVM stating that pt assistance medication is ready for pick up  Located in the nurse room fridge

## 2021-06-14 ENCOUNTER — Other Ambulatory Visit: Payer: Self-pay

## 2021-06-14 NOTE — Patient Outreach (Signed)
[facility]) Care Management  06/14/2021  Holly [name] Mar 26, 1941 [id number]   Telephone Assessment   Unsuccessful outreach attempt to patient. No answer after several rings and unable to leave message.    Plan: RN CM will make outreach attempt to patient within the month of Oct if no return call.   Enzo Montgomery, RN,BSN,CCM [place] Management Telephonic Care Management Coordinator Direct Phone: [phone] Toll Free: [phone] Fax: [phone]

## 2021-06-17 ENCOUNTER — Telehealth: Payer: Self-pay | Admitting: Internal Medicine

## 2021-06-17 DIAGNOSIS — M48062 Spinal stenosis, lumbar region with neurogenic claudication: Secondary | ICD-10-CM | POA: Diagnosis not present

## 2021-06-17 NOTE — Telephone Encounter (Signed)
LVM for pt to rtn my call to schedule AWV with [facility]. Please schedule this visit if pt calls the office.

## 2021-06-17 NOTE — Telephone Encounter (Signed)
Patient called in to let us know that she is not able to get into the office this week to pick up medication bc she just had injections done in her back   Patient states she will come in the 1st of next week to pick up meds

## 2021-06-17 NOTE — Telephone Encounter (Signed)
Notified son that attempts have been made to reach patient.  Requests that he contact her to let her know that medication is ready for her pick up.

## 2021-06-18 NOTE — Telephone Encounter (Signed)
Noted  

## 2021-07-13 DIAGNOSIS — M170 Bilateral primary osteoarthritis of knee: Secondary | ICD-10-CM | POA: Diagnosis not present

## 2021-07-13 DIAGNOSIS — M48062 Spinal stenosis, lumbar region with neurogenic claudication: Secondary | ICD-10-CM | POA: Diagnosis not present

## 2021-07-29 ENCOUNTER — Other Ambulatory Visit: Payer: Self-pay | Admitting: Internal Medicine

## 2021-07-29 DIAGNOSIS — E118 Type 2 diabetes mellitus with unspecified complications: Secondary | ICD-10-CM

## 2021-08-10 IMAGING — US US RENAL
1 series · 14 of 25 positions shown · non-contrast
Comparison: 08/08/2014

CLINICAL DATA: Stage III renal disease with proteinuria

EXAM:
RENAL / URINARY TRACT ULTRASOUND COMPLETE

[Series 1: us renal · 0.23mm/px · 14 of 56 slices shown]
[im 1/56]
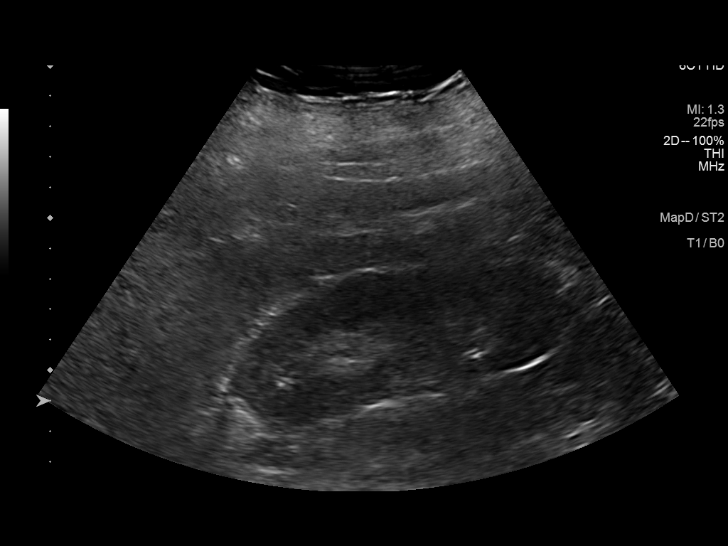
[im 5/56]
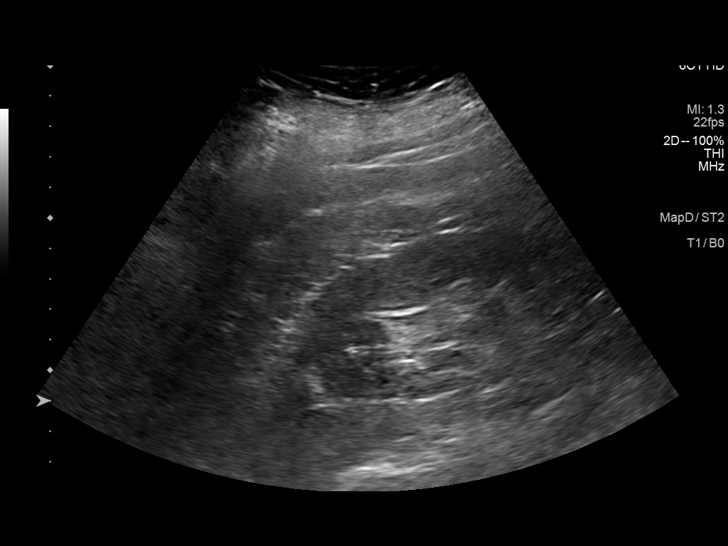
[im 10/56]
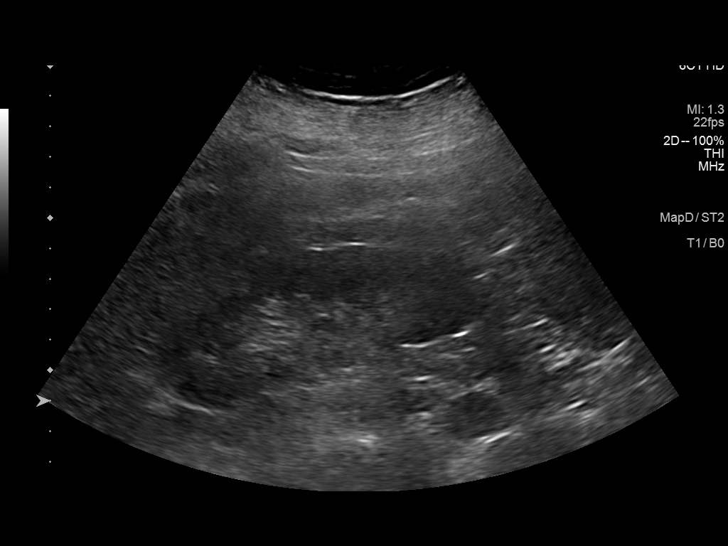
[im 14/56]
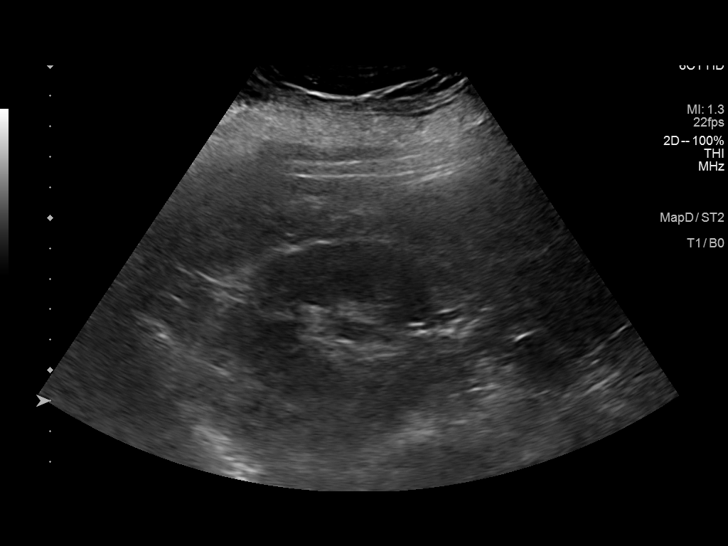
[im 19/56]
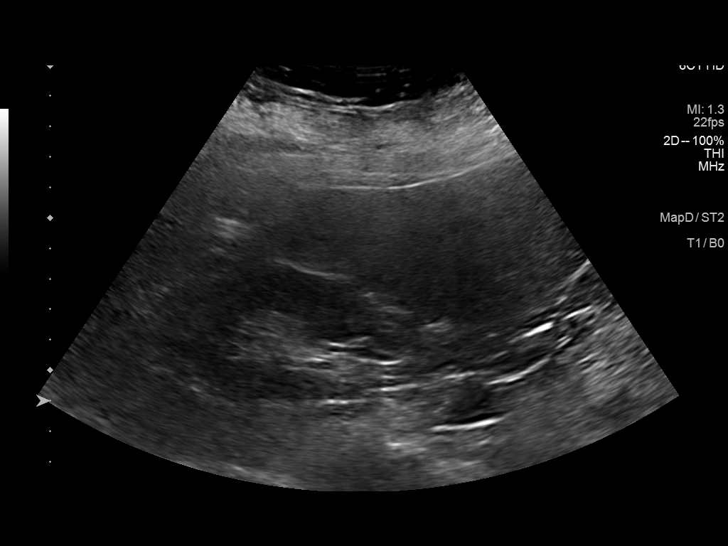
[im 21/56]
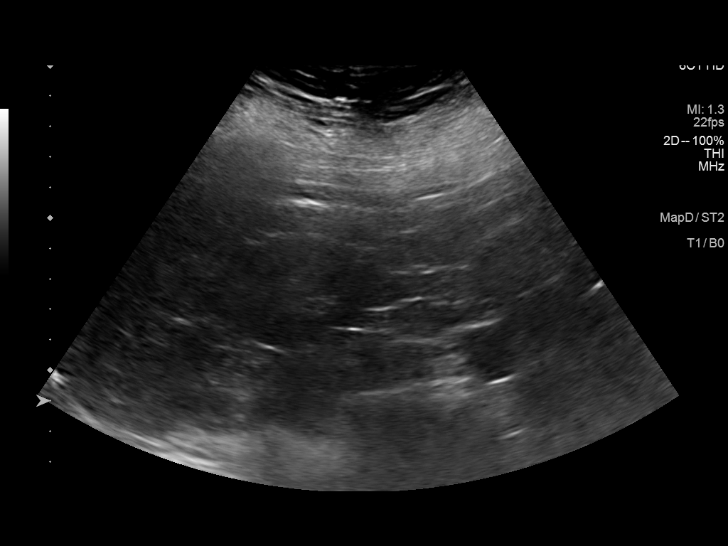
[im 26/56]
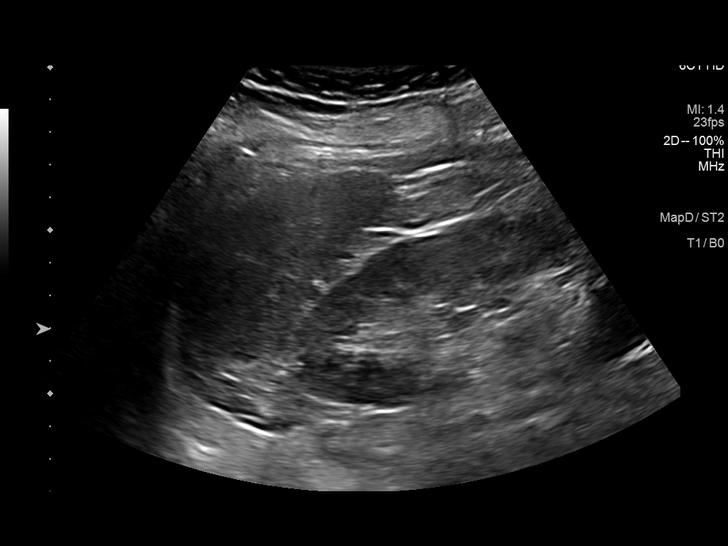
[im 30/56]
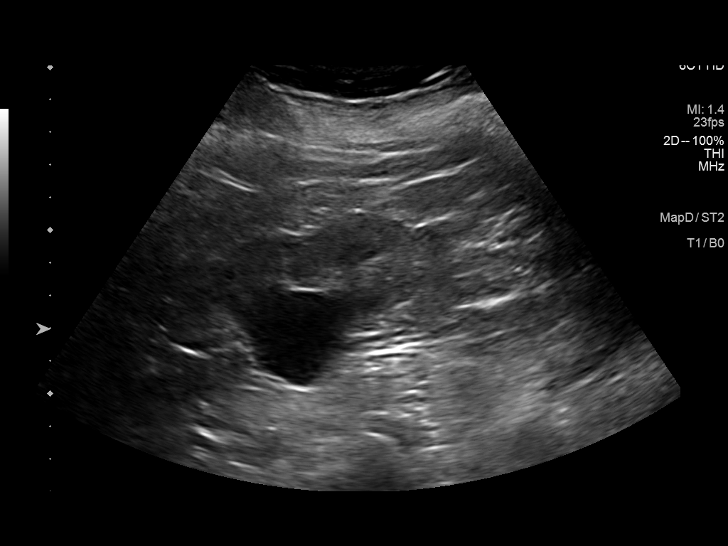
[im 35/56]
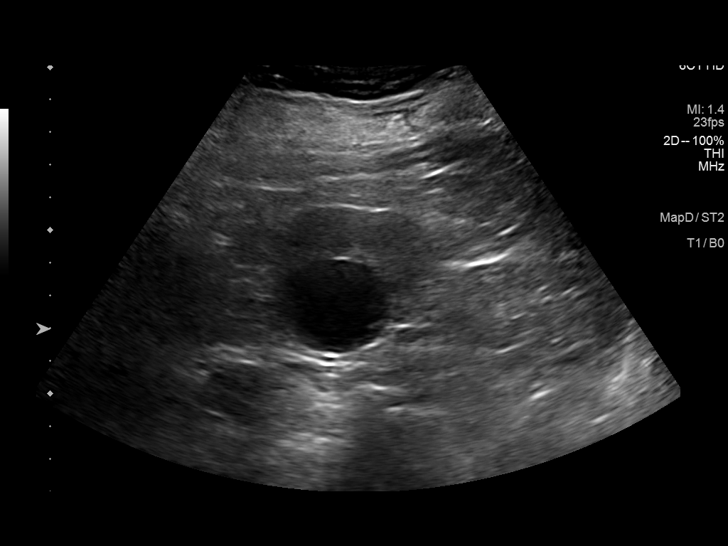
[im 37/56]
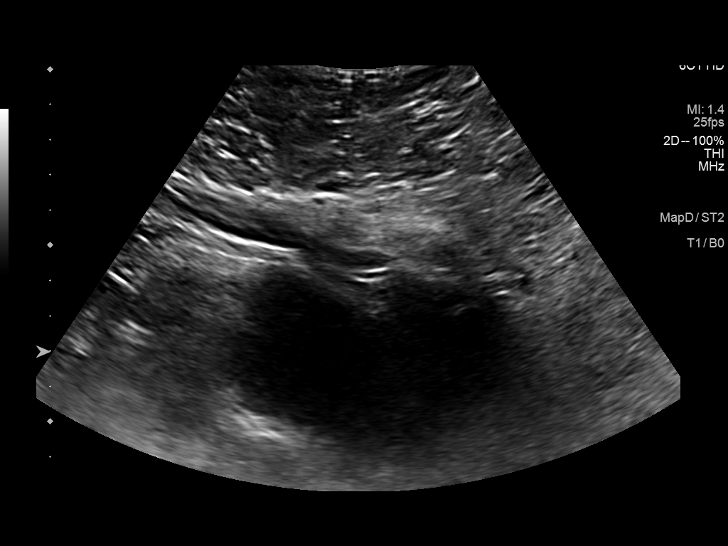
[im 42/56]
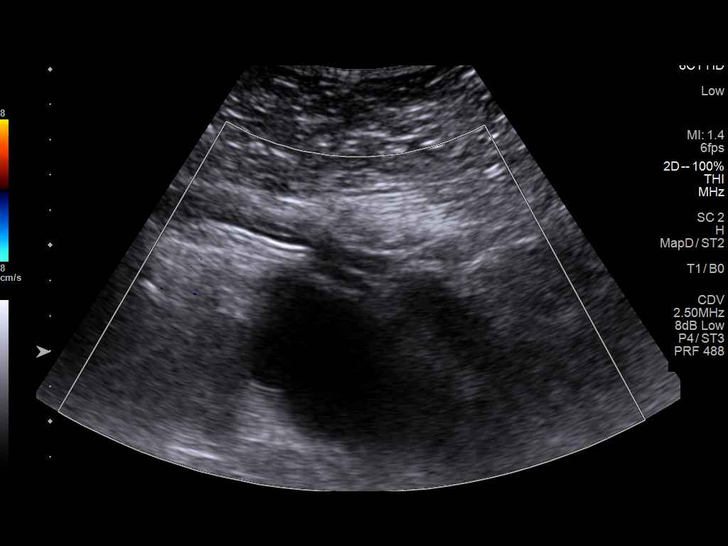
[im 46/56]
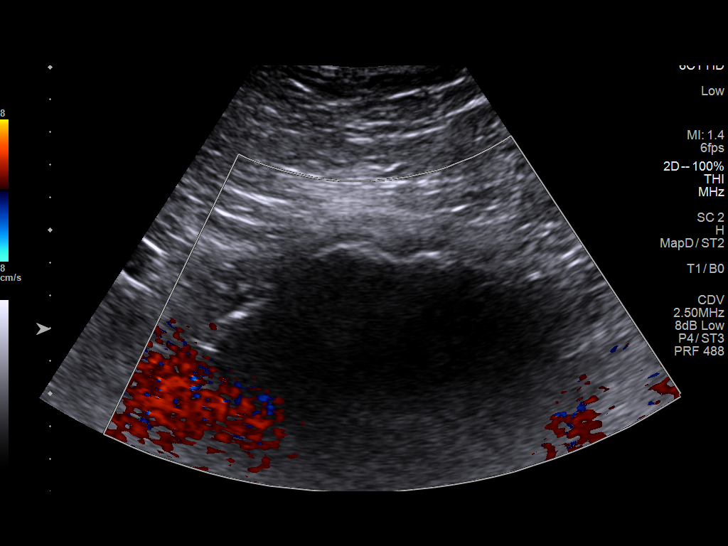
[im 51/56]
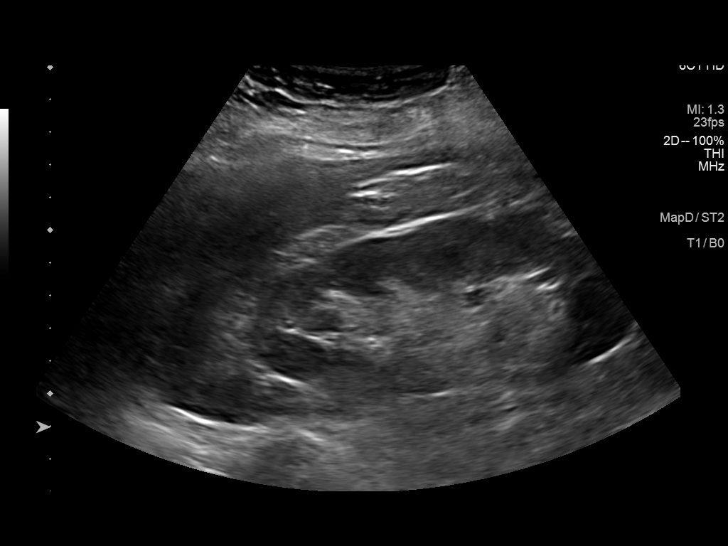
[im 56/56]
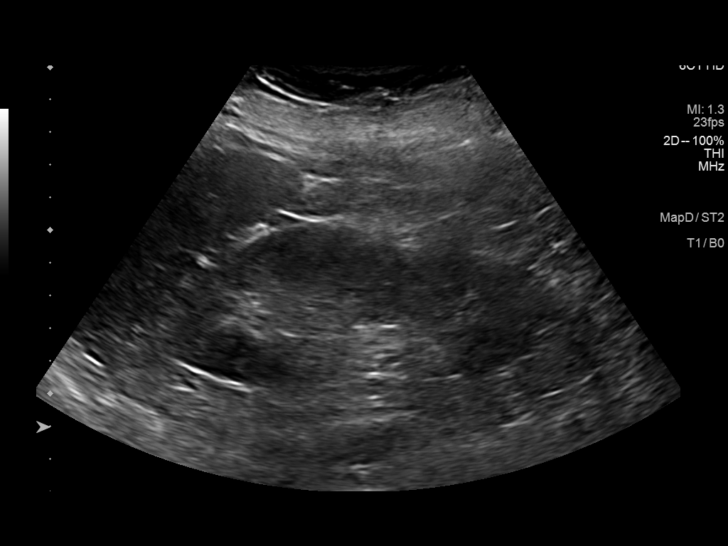

[14 of 25 positions shown; findings below may reference images not displayed]

FINDINGS: Right Kidney:

Renal measurements: 11.7 x 4.2 x 7.0 cm. = volume: 182 mL.
Echogenicity within normal limits. No mass or hydronephrosis
visualized.

Left Kidney:

Renal measurements: 12.0 x 5.8 x 6.9 cm = volume: 252 mL. No mass
lesion or hydronephrosis is noted. 3.6 cm simple cyst is noted in
the lower pole of the left kidney relatively stable in appearance
from the prior exam.

Bladder:

Appears normal for degree of bladder distention.

Other:

None.
IMPRESSION: Simple cyst in the left kidney. No other focal abnormality is noted.

## 2021-08-26 ENCOUNTER — Other Ambulatory Visit: Payer: Self-pay

## 2021-08-26 NOTE — Patient Outreach (Signed)
[facility]) Care Management  08/26/2021  Holly [name] September 24, 1941 [id number]   Telephone Assessment    Outreach attempt to patient. No answer. RN CM left HIPAA compliant voicemail message along with contact info.     Plan: RN CM will send unsuccessful outreach letter to patient. RN CM will make outreach attempt to patient within the month of Jan.   Kolbe Delmonaco [facility] [place] Management Telephonic Care Management Coordinator Direct Phone: [phone] Toll Free: [phone] Fax: [phone]

## 2021-08-30 ENCOUNTER — Ambulatory Visit (INDEPENDENT_AMBULATORY_CARE_PROVIDER_SITE_OTHER): Payer: Medicare PPO

## 2021-08-30 ENCOUNTER — Telehealth: Payer: Self-pay | Admitting: Internal Medicine

## 2021-08-30 ENCOUNTER — Other Ambulatory Visit: Payer: Self-pay

## 2021-08-30 DIAGNOSIS — Z23 Encounter for immunization: Secondary | ICD-10-CM

## 2021-08-30 NOTE — Telephone Encounter (Signed)
Jardiance pt assistance - re-enrollment for 2023. Completed prescriber section and printed med list. Faxed entire application to [place]: [phone]

## 2021-08-30 NOTE — Telephone Encounter (Signed)
Type of form received- Patient Assistance    Form placed in- Provider mailbox   Additional instructions from the patient- Fax when completed   Reminded patient that forms take 7-10 business days

## 2021-09-03 ENCOUNTER — Telehealth: Payer: Self-pay | Admitting: Internal Medicine

## 2021-09-03 NOTE — Telephone Encounter (Signed)
(  Continuing) Patient is requesting rx to be faxed to [facility] [phone]  Patients' last appt 04-04-2021

## 2021-09-03 NOTE — Telephone Encounter (Signed)
Patient is requesting a new rx for atorvastatin (LIPITOR) 40 MG tablet

## 2021-09-06 ENCOUNTER — Other Ambulatory Visit: Payer: Self-pay | Admitting: Internal Medicine

## 2021-09-06 DIAGNOSIS — E785 Hyperlipidemia, unspecified: Secondary | ICD-10-CM

## 2021-09-06 DIAGNOSIS — I70211 Atherosclerosis of native arteries of extremities with intermittent claudication, right leg: Secondary | ICD-10-CM

## 2021-09-06 MED ORDER — ATORVASTATIN CALCIUM 40 MG PO TABS: 40.0000 mg | ORAL_TABLET | Freq: Every day | ORAL | 0 refills | Status: DC

## 2021-09-20 ENCOUNTER — Telehealth: Payer: Self-pay | Admitting: Internal Medicine

## 2021-09-20 ENCOUNTER — Other Ambulatory Visit: Payer: Self-pay | Admitting: Internal Medicine

## 2021-09-20 DIAGNOSIS — E118 Type 2 diabetes mellitus with unspecified complications: Secondary | ICD-10-CM

## 2021-09-20 DIAGNOSIS — I10 Essential (primary) hypertension: Secondary | ICD-10-CM

## 2021-09-20 MED ORDER — EMPAGLIFLOZIN 25 MG PO TABS: 25.0000 mg | ORAL_TABLET | Freq: Every day | ORAL | 0 refills | Status: DC

## 2021-09-20 NOTE — Telephone Encounter (Signed)
1.Medication Requested: empagliflozin (JARDIANCE) 25 MG TABS tablet  2. Pharmacy (Name, Street, [place]): [facility], [place].  3. On Med List: yes  4. Last Visit with PCP: 04-02-2021  5. Next visit date with PCP: 10-05-2021   Agent: Please be advised that RX refills may take up to 3 business days. We ask that you follow-up with your pharmacy.    Patient requesting a call back to discuss prescription assistance application

## 2021-09-21 ENCOUNTER — Telehealth: Payer: Self-pay | Admitting: Pharmacist

## 2021-09-21 NOTE — Progress Notes (Signed)
    Chronic Care Management Pharmacy Assistant   Name: BRITNE BORELLI  MRN: [id number] DOB: [birth year]  A called was made to [place] for today to check on status of  Jardiance application faxed on 40/98/11. Representative stated that patient had no more refills and needed a new script to the end of the year. Did a verbal over the phone for 90 ds. Representative also said that patient needed to re-enroll for next year. Patient aware they will need to provide requested income verification to be sent the manufacturer.    [place] Pharmacist Assistant [phone]

## 2021-09-22 NOTE — Progress Notes (Signed)
    Chronic Care Management Pharmacy Assistant   Name: Holly [name]  MRN: [id number] DOB: [birth year]   Patient assistance renewal application for Lantus has been completed on behalf of the patient.  Patient aware they will need to provide requested income verification to be sent the manufacturer. Patient stated that she will come to the office to sign form the end of November.  [place] Pharmacist Assistant [phone]

## 2021-10-05 ENCOUNTER — Telehealth: Payer: Self-pay | Admitting: Internal Medicine

## 2021-10-05 ENCOUNTER — Encounter: Payer: Self-pay | Admitting: Internal Medicine

## 2021-10-05 ENCOUNTER — Other Ambulatory Visit: Payer: Self-pay

## 2021-10-05 ENCOUNTER — Ambulatory Visit: Payer: Medicare PPO | Admitting: Internal Medicine

## 2021-10-05 VITALS — BP 144/90 | HR 78 | Temp 97.7°F | Ht 69.0 in | Wt 310.0 lb

## 2021-10-05 DIAGNOSIS — E785 Hyperlipidemia, unspecified: Secondary | ICD-10-CM

## 2021-10-05 DIAGNOSIS — I10 Essential (primary) hypertension: Secondary | ICD-10-CM | POA: Diagnosis not present

## 2021-10-05 DIAGNOSIS — E118 Type 2 diabetes mellitus with unspecified complications: Secondary | ICD-10-CM | POA: Diagnosis not present

## 2021-10-05 DIAGNOSIS — Z0001 Encounter for general adult medical examination with abnormal findings: Secondary | ICD-10-CM | POA: Diagnosis not present

## 2021-10-05 DIAGNOSIS — N1831 Chronic kidney disease, stage 3a: Secondary | ICD-10-CM

## 2021-10-05 LAB — HEPATIC FUNCTION PANEL
ALT: 21 U/L (ref 0–35)
AST: 27 U/L (ref 0–37)
Albumin: 4.4 g/dL (ref 3.5–5.2)
Alkaline Phosphatase: 81 U/L (ref 39–117)
Bilirubin, Direct: 0.3 mg/dL (ref 0.0–0.3)
Total Bilirubin: 1.3 mg/dL — ABNORMAL HIGH (ref 0.2–1.2)
Total Protein: 7.6 g/dL (ref 6.0–8.3)

## 2021-10-05 LAB — URINALYSIS, ROUTINE W REFLEX MICROSCOPIC
Bilirubin Urine: NEGATIVE
Ketones, ur: NEGATIVE
Leukocytes,Ua: NEGATIVE
Nitrite: NEGATIVE
Specific Gravity, Urine: 1.02 (ref 1.000–1.030)
Total Protein, Urine: 30 — AB
Urine Glucose: >=1000 — AB
Urobilinogen, UA: 0.2 (ref 0.0–1.0)
pH: 5.5 (ref 5.0–8.0)

## 2021-10-05 LAB — CBC WITH DIFFERENTIAL/PLATELET
Basophils Absolute: 0 10*3/uL (ref 0.0–0.1)
Basophils Relative: 0.6 % (ref 0.0–3.0)
Eosinophils Absolute: 0.1 10*3/uL (ref 0.0–0.7)
Eosinophils Relative: 1.8 % (ref 0.0–5.0)
HCT: 42.6 % (ref 36.0–46.0)
Hemoglobin: 13.5 g/dL (ref 12.0–15.0)
Lymphocytes Relative: 26.7 % (ref 12.0–46.0)
Lymphs Abs: 1.6 10*3/uL (ref 0.7–4.0)
MCHC: 31.8 g/dL (ref 30.0–36.0)
MCV: 85.9 fl (ref 78.0–100.0)
Monocytes Absolute: 0.6 10*3/uL (ref 0.1–1.0)
Monocytes Relative: 9.5 % (ref 3.0–12.0)
Neutro Abs: 3.8 10*3/uL (ref 1.4–7.7)
Neutrophils Relative %: 61.4 % (ref 43.0–77.0)
Platelets: 203 10*3/uL (ref 150.0–400.0)
RBC: 4.95 Mil/uL (ref 3.87–5.11)
RDW: 14.2 % (ref 11.5–15.5)
WBC: 6.2 10*3/uL (ref 4.0–10.5)

## 2021-10-05 LAB — HEMOGLOBIN A1C: Hgb A1c MFr Bld: 7.2 % — ABNORMAL HIGH (ref 4.6–6.5)

## 2021-10-05 LAB — BASIC METABOLIC PANEL
BUN: 35 mg/dL — ABNORMAL HIGH (ref 6–23)
CO2: 27 mEq/L (ref 19–32)
Calcium: 10 mg/dL (ref 8.4–10.5)
Chloride: 99 mEq/L (ref 96–112)
Creatinine, Ser: 1.52 mg/dL — ABNORMAL HIGH (ref 0.40–1.20)
GFR: 32.68 mL/min — ABNORMAL LOW (ref >60.00)
Glucose, Bld: 127 mg/dL — ABNORMAL HIGH (ref 70–99)
Potassium: 3.7 mEq/L (ref 3.5–5.1)
Sodium: 137 mEq/L (ref 135–145)

## 2021-10-05 LAB — LIPID PANEL
Cholesterol: 170 mg/dL (ref 0–200)
HDL: 61.7 mg/dL (ref >39.00)
LDL Cholesterol: 90 mg/dL (ref 0–99)
NonHDL: 108.73
Total CHOL/HDL Ratio: 3
Triglycerides: 96 mg/dL (ref 0.0–149.0)
VLDL: 19.2 mg/dL (ref 0.0–40.0)

## 2021-10-05 LAB — TSH: TSH: 2.69 u[IU]/mL (ref 0.35–5.50)

## 2021-10-05 NOTE — Progress Notes (Signed)
Subjective:  Patient ID: Holly [name], female    DOB: [birth year]  Age: 80 y.o. MRN: [id number]  CC: Annual Exam, Diabetes, Hypertension, and Hyperlipidemia  This visit occurred during the SARS-CoV-2 public health emergency.  Safety protocols were in place, including screening questions prior to the visit, additional usage of staff PPE, and extensive cleaning of exam room while observing appropriate contact time as indicated for disinfecting solutions.    HPI Holly [name] presents for a CPX and f/up -  She complains of fatigue and weight gain.  She denies chest pain, shortness of breath, palpitations, edema, or diaphoresis.  Outpatient Medications Prior to Visit  Medication Sig Dispense Refill   Accu-Chek FastClix Lancets MISC USE THREE TIMES DAILY 306 each 1   ACCU-CHEK GUIDE test strip USE THREE TIMES DAILY 300 strip 1   acetaminophen (TYLENOL) 500 MG tablet Take 1,000 mg by mouth every 6 (six) hours as needed for mild pain.     atorvastatin (LIPITOR) 40 MG tablet Take 1 tablet (40 mg total) by mouth daily. 90 tablet 0   Blood Glucose Monitoring Suppl (ACCU-CHEK AVIVA PLUS) w/Device KIT Use to check blood sugars daily Dx E11.9 1 kit 0   carvedilol (COREG) 12.5 MG tablet TAKE 1 TABLET TWICE DAILY WITH MEALS 180 tablet 0   cetirizine (ZYRTEC) 10 MG tablet Take 10 mg by mouth daily.     Cholecalciferol 2000 units TABS Take 1 tablet (2,000 Units total) by mouth daily. 90 tablet 1   Continuous Blood Gluc Receiver (DEXCOM G6 RECEIVER) DEVI 1 Act by Does not apply route daily. 1 each 5   Continuous Blood Gluc Sensor (DEXCOM G6 SENSOR) MISC 1 Act by Does not apply route daily. 1 each 5   Continuous Blood Gluc Transmit (DEXCOM G6 TRANSMITTER) MISC 1 Act by Does not apply route daily. 1 each 5   Cyanocobalamin (VITAMIN B-12 CR PO) Take 1 tablet by mouth daily.     DROPLET PEN NEEDLES 32G X 6 MM MISC USE DAILY 100 each 3   empagliflozin (JARDIANCE) 25 MG TABS tablet Take 1 tablet (25 mg  total) by mouth daily before breakfast. 90 tablet 0   Glucagon (GVOKE HYPOPEN 2-PACK) 1 MG/0.2ML SOAJ Inject 1 Act into the skin daily as needed. 2 mL 5   indapamide (LOZOL) 1.25 MG tablet Take 1 tablet (1.25 mg total) by mouth daily. 90 tablet 1   insulin glargine (LANTUS SOLOSTAR) 100 UNIT/ML Solostar Pen Inject 50 Units into the skin daily. 45 mL 0   metFORMIN (GLUCOPHAGE-XR) 750 MG 24 hr tablet TAKE 2 TABLETS EVERY DAY WITH BREAKFAST 180 tablet 1   olmesartan (BENICAR) 20 MG tablet TAKE 1 TABLET EVERY DAY 90 tablet 0   No facility-administered medications prior to visit.    ROS Review of Systems  Constitutional:  Positive for diaphoresis, fatigue and unexpected weight change.  HENT: Negative.    Eyes: Negative.   Respiratory:  Negative for cough, chest tightness, shortness of breath and wheezing.   Cardiovascular:  Negative for chest pain, palpitations and leg swelling.  Gastrointestinal:  Negative for abdominal pain, constipation, diarrhea, nausea and vomiting.  Endocrine: Negative.   Genitourinary: Negative.  Negative for difficulty urinating.  Musculoskeletal:  Positive for arthralgias. Negative for myalgias.  Skin: Negative.   Neurological: Negative.  Negative for dizziness, weakness, light-headedness and headaches.  Hematological:  Negative for adenopathy. Does not bruise/bleed easily.  Psychiatric/Behavioral: Negative.     Objective:  BP (!) 144/90 (BP Location: Left  Arm, Patient Position: Sitting, Cuff Size: Large)   Pulse 78   Temp 97.7 F (36.5 C) (Oral)   Ht '5\' 9"'  (1.753 m)   Wt (!) 310 lb (140.6 kg)   SpO2 96%   BMI 45.78 kg/m   BP Readings from Last 3 Encounters:  10/05/21 (!) 144/90  04/02/21 (!) 144/86  12/18/20 (!) 144/86    Wt Readings from Last 3 Encounters:  10/05/21 (!) 310 lb (140.6 kg)  04/02/21 (!) 303 lb (137.4 kg)  12/18/20 (!) 307 lb (139.3 kg)    Physical Exam Vitals reviewed.  HENT:     Nose: Nose normal.     Mouth/Throat:      Mouth: Mucous membranes are moist.  Eyes:     General: No scleral icterus.    Conjunctiva/sclera: Conjunctivae normal.  Cardiovascular:     Rate and Rhythm: Normal rate and regular rhythm.     Heart sounds: No murmur heard. Pulmonary:     Effort: Pulmonary effort is normal.     Breath sounds: No stridor. No wheezing, rhonchi or rales.  Abdominal:     General: Abdomen is protuberant. Bowel sounds are normal. There is no distension.     Palpations: Abdomen is soft. There is no hepatomegaly, splenomegaly or mass.     Tenderness: There is no abdominal tenderness.  Musculoskeletal:        General: Normal range of motion.     Cervical back: Neck supple.     Right lower leg: No edema.     Left lower leg: No edema.  Lymphadenopathy:     Cervical: No cervical adenopathy.  Skin:    General: Skin is warm and dry.  Neurological:     General: No focal deficit present.     Mental Status: She is alert.  Psychiatric:        Mood and Affect: Mood normal.        Behavior: Behavior normal.    Lab Results  Component Value Date   WBC 6.2 10/05/2021   HGB 13.5 10/05/2021   HCT 42.6 10/05/2021   PLT 203.0 10/05/2021   GLUCOSE 127 (H) 10/05/2021   CHOL 170 10/05/2021   TRIG 96.0 10/05/2021   HDL 61.70 10/05/2021   LDLDIRECT 134.8 08/27/2010   LDLCALC 90 10/05/2021   ALT 21 10/05/2021   AST 27 10/05/2021   NA 137 10/05/2021   K 3.7 10/05/2021   CL 99 10/05/2021   CREATININE 1.52 (H) 10/05/2021   BUN 35 (H) 10/05/2021   CO2 27 10/05/2021   TSH 2.69 10/05/2021   HGBA1C 7.2 (H) 10/05/2021   MICROALBUR 445.8 03/01/2021    US RENAL  Result Date: 07/03/2020 CLINICAL DATA:  Stage III renal disease with proteinuria EXAM: RENAL / URINARY TRACT ULTRASOUND COMPLETE COMPARISON:  08/08/2014 FINDINGS: Right Kidney: Renal measurements: 11.7 x 4.2 x 7.0 cm. = volume: 182 mL. Echogenicity within normal limits. No mass or hydronephrosis visualized. Left Kidney: Renal measurements: 12.0 x 5.8 x 6.9 cm  = volume: 252 mL. No mass lesion or hydronephrosis is noted. 3.6 cm simple cyst is noted in the lower pole of the left kidney relatively stable in appearance from the prior exam. Bladder: Appears normal for degree of bladder distention. Other: None. IMPRESSION: Simple cyst in the left kidney. No other focal abnormality is noted. Electronically Signed   By: Inez Catalina M.D.   On: 07/03/2020 08:12    Assessment & Plan:   Shaunice was seen today for annual exam,  diabetes, hypertension and hyperlipidemia.  Diagnoses and all orders for this visit:  Primary hypertension -     CBC with Differential/Platelet; Future -     TSH; Future -     Urinalysis, Routine w reflex microscopic; Future -     Basic metabolic panel; Future -     Basic metabolic panel -     Urinalysis, Routine w reflex microscopic -     TSH -     CBC with Differential/Platelet  Malignant hypertension- Her blood pressure is adequately well controlled. -     CBC with Differential/Platelet; Future -     TSH; Future -     Urinalysis, Routine w reflex microscopic; Future -     Basic metabolic panel; Future -     Hepatic function panel; Future -     Hepatic function panel -     Basic metabolic panel -     Urinalysis, Routine w reflex microscopic -     TSH -     CBC with Differential/Platelet  Type II diabetes mellitus with manifestations ([place])- Her blood sugar is adequately well controlled. -     Hemoglobin A1c; Future -     Basic metabolic panel; Future -     Hepatic function panel; Future -     Hepatic function panel -     Basic metabolic panel -     Hemoglobin A1c -     Finerenone (KERENDIA) 10 MG TABS; Take 1 tablet by mouth daily.  Stage 3a chronic kidney disease ([place])- I recommended that she start taking an MRA for cardiovascular risk reduction and renal protection. -     CBC with Differential/Platelet; Future -     Urinalysis, Routine w reflex microscopic; Future -     Basic metabolic panel; Future -     Basic  metabolic panel -     Urinalysis, Routine w reflex microscopic -     CBC with Differential/Platelet -     Finerenone (KERENDIA) 10 MG TABS; Take 1 tablet by mouth daily.  Hyperlipidemia with target LDL less than 100- LDL goal achieved. Doing well on the statin  -     Lipid panel; Future -     TSH; Future -     Hepatic function panel; Future -     Hepatic function panel -     TSH -     Lipid panel  Encounter for general adult medical examination with abnormal findings- Exam completed, labs reviewed, vaccines reviewed and updated, no cancer screenings indicated, patient education was given.  I am having [name]. Elpidio Eric start on Kerendia. I am also having her maintain her cetirizine, Cyanocobalamin (VITAMIN B-12 CR PO), Cholecalciferol, Accu-Chek Aviva Plus, Accu-Chek Guide, Accu-Chek FastClix Lancets, acetaminophen, Lantus SoloStar, indapamide, Dexcom G6 Receiver, Dexcom G6 Transmitter, Dexcom G6 Sensor, Gvoke HypoPen 2-Pack, metFORMIN, Droplet Pen Needles, atorvastatin, carvedilol, olmesartan, and empagliflozin.  Meds ordered this encounter  Medications   Finerenone (KERENDIA) 10 MG TABS    Sig: Take 1 tablet by mouth daily.    Dispense:  30 tablet    Refill:  0      Follow-up: Return in about 6 months (around 04/04/2022).  Scarlette Calico, MD

## 2021-10-05 NOTE — Chronic Care Management (AMB) (Signed)
Renewal for PAP - lantus - completed and faxed 10/05/2021  Tomasa Blase, PharmD Clinical Pharmacist, [place]

## 2021-10-05 NOTE — Patient Instructions (Signed)

## 2021-10-05 NOTE — Telephone Encounter (Signed)
Type of form received : Patient Assistant Forms for Lantus   Form placed in: Provider mailbox  Additional instructions from the patient: fax ([phone])  Things to remember: [place] office: If form received in person, remind patient that forms take 7-10 business days CMA should attach charge sheet and put on [employer]

## 2021-10-06 ENCOUNTER — Encounter: Payer: Self-pay | Admitting: Internal Medicine

## 2021-10-06 MED ORDER — KERENDIA 10 MG PO TABS: 1.0000 | ORAL_TABLET | Freq: Every day | ORAL | 0 refills | Status: DC

## 2021-10-14 ENCOUNTER — Telehealth: Payer: Self-pay | Admitting: Internal Medicine

## 2021-10-14 NOTE — Telephone Encounter (Signed)
Patient called in  Says she spoke w/ pharmacy & they told her that her insurance is not covering Finerenone (KERENDIA) 10 MG TABS  Patient wants to know if Dr can send in PA to have medication covered or if another medication can be sent in that is covered  Please call patient [phone]

## 2021-10-14 NOTE — Telephone Encounter (Signed)
Approved until 11/13/2022.  Pt has been informed.

## 2021-10-14 NOTE — Telephone Encounter (Signed)
Key: [id number]

## 2021-10-18 DIAGNOSIS — Z1231 Encounter for screening mammogram for malignant neoplasm of breast: Secondary | ICD-10-CM | POA: Diagnosis not present

## 2021-10-18 LAB — HM MAMMOGRAPHY

## 2021-10-22 ENCOUNTER — Other Ambulatory Visit: Payer: Self-pay | Admitting: Internal Medicine

## 2021-10-22 DIAGNOSIS — I10 Essential (primary) hypertension: Secondary | ICD-10-CM

## 2021-10-22 DIAGNOSIS — R600 Localized edema: Secondary | ICD-10-CM

## 2021-10-26 DIAGNOSIS — M47816 Spondylosis without myelopathy or radiculopathy, lumbar region: Secondary | ICD-10-CM | POA: Diagnosis not present

## 2021-11-16 ENCOUNTER — Other Ambulatory Visit: Payer: Self-pay

## 2021-11-16 NOTE — Patient Outreach (Signed)
[facility]) Care Management  11/16/2021  ELENOR WILDES 09/02/1941 [id number]   Telephone Assessment    Unsuccessful quarterly outreach attempt to patient. No answer after multiple rings.      Plan: RN CM will make quarterly outreach attempt to patient within the month of April.   Enzo Montgomery, RN,BSN,CCM [place] Management Telephonic Care Management Coordinator Direct Phone: [phone] Toll Free: [phone] Fax: [phone]

## 2021-11-17 ENCOUNTER — Ambulatory Visit: Payer: Self-pay

## 2021-11-22 ENCOUNTER — Other Ambulatory Visit: Payer: Self-pay | Admitting: Internal Medicine

## 2021-11-22 DIAGNOSIS — E785 Hyperlipidemia, unspecified: Secondary | ICD-10-CM

## 2021-11-22 DIAGNOSIS — I70211 Atherosclerosis of native arteries of extremities with intermittent claudication, right leg: Secondary | ICD-10-CM

## 2021-11-23 DIAGNOSIS — M1712 Unilateral primary osteoarthritis, left knee: Secondary | ICD-10-CM | POA: Diagnosis not present

## 2021-11-23 DIAGNOSIS — M1711 Unilateral primary osteoarthritis, right knee: Secondary | ICD-10-CM | POA: Diagnosis not present

## 2021-11-23 DIAGNOSIS — M25512 Pain in left shoulder: Secondary | ICD-10-CM | POA: Diagnosis not present

## 2021-11-23 DIAGNOSIS — N1831 Chronic kidney disease, stage 3a: Secondary | ICD-10-CM | POA: Diagnosis not present

## 2021-11-23 DIAGNOSIS — M791 Myalgia, unspecified site: Secondary | ICD-10-CM | POA: Diagnosis not present

## 2021-11-23 DIAGNOSIS — M170 Bilateral primary osteoarthritis of knee: Secondary | ICD-10-CM | POA: Diagnosis not present

## 2021-11-30 DIAGNOSIS — E785 Hyperlipidemia, unspecified: Secondary | ICD-10-CM | POA: Diagnosis not present

## 2021-11-30 DIAGNOSIS — I129 Hypertensive chronic kidney disease with stage 1 through stage 4 chronic kidney disease, or unspecified chronic kidney disease: Secondary | ICD-10-CM | POA: Diagnosis not present

## 2021-11-30 DIAGNOSIS — N2581 Secondary hyperparathyroidism of renal origin: Secondary | ICD-10-CM | POA: Diagnosis not present

## 2021-11-30 DIAGNOSIS — D631 Anemia in chronic kidney disease: Secondary | ICD-10-CM | POA: Diagnosis not present

## 2021-11-30 DIAGNOSIS — R809 Proteinuria, unspecified: Secondary | ICD-10-CM | POA: Diagnosis not present

## 2021-11-30 DIAGNOSIS — N1831 Chronic kidney disease, stage 3a: Secondary | ICD-10-CM | POA: Diagnosis not present

## 2021-11-30 DIAGNOSIS — E1122 Type 2 diabetes mellitus with diabetic chronic kidney disease: Secondary | ICD-10-CM | POA: Diagnosis not present

## 2021-12-03 ENCOUNTER — Telehealth: Payer: Self-pay | Admitting: Internal Medicine

## 2021-12-03 NOTE — Telephone Encounter (Signed)
Patient inquiring if another application has to be completed for financial medication assistance for jardiance and insulin glargine (LANTUS SOLOSTAR) 100 UNIT/ML Solostar Pen  Patient also inquiring if DR. Sing w/ [facility] has contacted provider w/ an update on patient's care  Please c/b patient

## 2021-12-07 NOTE — Telephone Encounter (Signed)
Spoke with patient,   Approved for lantus PAP - called [employer] - refill of lantus to be delivered to office within the next 7-10 business days   Tomasa Blase, PharmD Clinical Pharmacist, [place]

## 2021-12-07 NOTE — Telephone Encounter (Signed)
Patient checking status of return call  Patient states she has received a letter regarding medication assistance for insulin glargine (LANTUS SOLOSTAR) 100 UNIT/ML Solostar Pen but not for Jardiance   Patient needs clearance on what form needs to be completed in order to receive financial assistance on medications

## 2021-12-08 ENCOUNTER — Telehealth: Payer: Self-pay

## 2021-12-08 NOTE — Telephone Encounter (Signed)
Note not needed 

## 2021-12-08 NOTE — Telephone Encounter (Signed)
[employer] PAP application for jardiance 25mg  started - left patient portion at front desk for completion   Jardiance 25mg  samples left at front desk  Lot: [id number] Exp: 03/2023 #14 tablets   Tomasa Blase, PharmD Clinical Pharmacist, [place]

## 2021-12-09 ENCOUNTER — Telehealth: Payer: Self-pay | Admitting: Internal Medicine

## 2021-12-09 NOTE — Telephone Encounter (Signed)
[employer] PAP completed and faxed to manufacturer   Patient made aware that 2 weeks worth of sample are waiting for her at front desk, patient will pick up tomorrow  Follow up for status of PAP in 1 week

## 2021-12-09 NOTE — Telephone Encounter (Signed)
PT visited this morning to finish filling out a set of forms. She also would like to let pharmacy know that she is almost out of the Jardiance 25 mg and will run out before the Patient Assistant forms reach their destination.

## 2021-12-13 LAB — BASIC METABOLIC PANEL
BUN: 36 — AB (ref 4–21)
CO2: 30 — AB (ref 13–22)
Chloride: 99 (ref 99–108)
Creatinine: 1.4 — AB (ref 0.5–1.1)
Glucose: 168
Potassium: 3.9 mEq/L (ref 3.5–5.1)
Sodium: 138 (ref 137–147)

## 2021-12-13 LAB — COMPREHENSIVE METABOLIC PANEL
Albumin: 4.6 (ref 3.5–5.0)
Calcium: 10.3 (ref 8.7–10.7)
eGFR: 40

## 2021-12-28 DIAGNOSIS — N1831 Chronic kidney disease, stage 3a: Secondary | ICD-10-CM | POA: Diagnosis not present

## 2022-01-13 ENCOUNTER — Other Ambulatory Visit: Payer: Self-pay | Admitting: Internal Medicine

## 2022-01-13 DIAGNOSIS — E118 Type 2 diabetes mellitus with unspecified complications: Secondary | ICD-10-CM

## 2022-01-28 ENCOUNTER — Ambulatory Visit (INDEPENDENT_AMBULATORY_CARE_PROVIDER_SITE_OTHER): Payer: Medicare PPO

## 2022-01-28 DIAGNOSIS — Z0000 Encounter for general adult medical examination without abnormal findings: Secondary | ICD-10-CM

## 2022-01-28 NOTE — Patient Instructions (Signed)
Holly [name] , ?Thank you for taking time to come for your Medicare Wellness Visit. I appreciate your ongoing commitment to your health goals. Please review the following plan we discussed and let me know if I can assist you in the future.  ? ?Screening recommendations/referrals: ?Colonoscopy: Not recommended for screening due to age ?Mammogram: 10/18/2021; due every 1-2 years ?Bone Density: 01/20/2016; normal results ?Recommended yearly ophthalmology/optometry visit for glaucoma screening and checkup ?Recommended yearly dental visit for hygiene and checkup ? ?Vaccinations: ?Influenza vaccine: 08/30/2021 ?Pneumococcal vaccine: 04/08/2014 ?Tdap vaccine: 09/25/2018; due every 10 years ?Shingles vaccine: never done   ?Covid-19: 12/27/2019, 3/12/202, 09/16/2020, 07/06/2021 ? ?Advanced directives: Please bring a copy of your health care power of attorney and living will to the office at your convenience. ? ?Conditions/risks identified: Yes; Client understands the importance of follow-up appointments with providers by attending scheduled visits and discussed goals to eat healthier, increase physical activity 5 times a week for 30 minutes each, exercise the brain by doing stimulating brain exercises (reading, adult coloring, crafting, listening to music, puzzles, etc.), socialize and enjoy life more, get enough sleep at least 8-9 hours average per night and make time for laughter. ? ?Next appointment:  Please schedule your next Medicare Wellness Visit with your Nurse Health Advisor in 1 year by calling [phone]. ? ? ?Preventive Care 21 Years and Older, Female ?Preventive care refers to lifestyle choices and visits with your health care provider that can promote health and wellness. ?What does preventive care include? ?A yearly physical exam. This is also called an annual well check. ?Dental exams once or twice a year. ?Routine eye exams. Ask your health care provider how often you should have your eyes checked. ?Personal  lifestyle choices, including: ?Daily care of your teeth and gums. ?Regular physical activity. ?Eating a healthy diet. ?Avoiding tobacco and drug use. ?Limiting alcohol use. ?Practicing safe sex. ?Taking low-dose aspirin every day. ?Taking vitamin and mineral supplements as recommended by your health care provider. ?What happens during an annual well check? ?The services and screenings done by your health care provider during your annual well check will depend on your age, overall health, lifestyle risk factors, and family history of disease. ?Counseling  ?Your health care provider may ask you questions about your: ?Alcohol use. ?Tobacco use. ?Drug use. ?Emotional well-being. ?Home and relationship well-being. ?Sexual activity. ?Eating habits. ?History of falls. ?Memory and ability to understand (cognition). ?Work and work Statistician. ?Reproductive health. ?Screening  ?You may have the following tests or measurements: ?Height, weight, and BMI. ?Blood pressure. ?Lipid and cholesterol levels. These may be checked every 5 years, or more frequently if you are over 92 years old. ?Skin check. ?Lung cancer screening. You may have this screening every year starting at age 36 if you have a 30-pack-year history of smoking and currently smoke or have quit within the past 15 years. ?Fecal occult blood test (FOBT) of the stool. You may have this test every year starting at age 61. ?Flexible sigmoidoscopy or colonoscopy. You may have a sigmoidoscopy every 5 years or a colonoscopy every 10 years starting at age 20. ?Hepatitis C blood test. ?Hepatitis B blood test. ?Sexually transmitted disease (STD) testing. ?Diabetes screening. This is done by checking your blood sugar (glucose) after you have not eaten for a while (fasting). You may have this done every 1-3 years. ?Bone density scan. This is done to screen for osteoporosis. You may have this done starting at age 34. ?Mammogram. This may be done every 1-2  years. Talk to your  health care provider about how often you should have regular mammograms. ?Talk with your health care provider about your test results, treatment options, and if necessary, the need for more tests. ?Vaccines  ?Your health care provider may recommend certain vaccines, such as: ?Influenza vaccine. This is recommended every year. ?Tetanus, diphtheria, and acellular pertussis (Tdap, Td) vaccine. You may need a Td booster every 10 years. ?Zoster vaccine. You may need this after age 88. ?Pneumococcal 13-valent conjugate (PCV13) vaccine. One dose is recommended after age 25. ?Pneumococcal polysaccharide (PPSV23) vaccine. One dose is recommended after age 35. ?Talk to your health care provider about which screenings and vaccines you need and how often you need them. ?This information is not intended to replace advice given to you by your health care provider. Make sure you discuss any questions you have with your health care provider. ?Document Released: 11/27/2015 Document Revised: 07/20/2016 Document Reviewed: 09/01/2015 ?Elsevier Interactive Patient Education ? 2017 [place]. ? ?Fall Prevention in the Home ?Falls can cause injuries. They can happen to people of all ages. There are many things you can do to make your home safe and to help prevent falls. ?What can I do on the outside of my home? ?Regularly fix the edges of walkways and driveways and fix any cracks. ?Remove anything that might make you trip as you walk through a door, such as a raised step or threshold. ?Trim any bushes or trees on the path to your home. ?Use bright outdoor lighting. ?Clear any walking paths of anything that might make someone trip, such as rocks or tools. ?Regularly check to see if handrails are loose or broken. Make sure that both sides of any steps have handrails. ?Any raised decks and porches should have guardrails on the edges. ?Have any leaves, snow, or ice cleared regularly. ?Use sand or salt on walking paths during winter. ?Clean  up any spills in your garage right away. This includes oil or grease spills. ?What can I do in the bathroom? ?Use night lights. ?Install grab bars by the toilet and in the tub and shower. Do not use towel bars as grab bars. ?Use non-skid mats or decals in the tub or shower. ?If you need to sit down in the shower, use a plastic, non-slip stool. ?Keep the floor dry. Clean up any water that spills on the floor as soon as it happens. ?Remove soap buildup in the tub or shower regularly. ?Attach bath mats securely with double-sided non-slip rug tape. ?Do not have throw rugs and other things on the floor that can make you trip. ?What can I do in the bedroom? ?Use night lights. ?Make sure that you have a light by your bed that is easy to reach. ?Do not use any sheets or blankets that are too big for your bed. They should not hang down onto the floor. ?Have a firm chair that has side arms. You can use this for support while you get dressed. ?Do not have throw rugs and other things on the floor that can make you trip. ?What can I do in the kitchen? ?Clean up any spills right away. ?Avoid walking on wet floors. ?Keep items that you use a lot in easy-to-reach places. ?If you need to reach something above you, use a strong step stool that has a grab bar. ?Keep electrical cords out of the way. ?Do not use floor polish or wax that makes floors slippery. If you must use wax, use non-skid floor  wax. ?Do not have throw rugs and other things on the floor that can make you trip. ?What can I do with my stairs? ?Do not leave any items on the stairs. ?Make sure that there are handrails on both sides of the stairs and use them. Fix handrails that are broken or loose. Make sure that handrails are as long as the stairways. ?Check any carpeting to make sure that it is firmly attached to the stairs. Fix any carpet that is loose or worn. ?Avoid having throw rugs at the top or bottom of the stairs. If you do have throw rugs, attach them to the  floor with carpet tape. ?Make sure that you have a light switch at the top of the stairs and the bottom of the stairs. If you do not have them, ask someone to add them for you. ?What else can I do to help preve

## 2022-01-28 NOTE — Progress Notes (Signed)
?I connected with Holly [name] today by telephone and verified that I am speaking with the correct person using two identifiers. ?Location patient: home ?Location provider: work ?Persons participating in the virtual visit: patient, provider. ?  ?I discussed the limitations, risks, security and privacy concerns of performing an evaluation and management service by telephone and the availability of in person appointments. I also discussed with the patient that there may be a patient responsible charge related to this service. The patient expressed understanding and verbally consented to this telephonic visit.  ?  ?Interactive audio and video telecommunications were attempted between this provider and patient, however failed, due to patient having technical difficulties OR patient did not have access to video capability.  We continued and completed visit with audio only. ? ?Some vital signs may be absent or patient reported.  ? ?Time Spent with patient on telephone encounter: 40 minutes ? ?Subjective:  ? Holly [name] is a 81 y.o. female who presents for Medicare Annual (Subsequent) preventive examination. ? ?Review of Systems    ? ?Cardiac Risk Factors include: advanced age (>32mn, >>60women);diabetes mellitus;hypertension;dyslipidemia;family history of premature cardiovascular disease ? ?   ?Objective:  ?  ?There were no vitals filed for this visit. ?There is no height or weight on file to calculate BMI. ? ?Advanced Directives 01/28/2022 03/19/2021 05/22/2020 05/28/2019 05/03/2019 08/22/2018 08/14/2017  ?Does Patient Have a Medical Advance Directive? Yes No No No;Yes Yes Yes;No No  ?Type of Advance Directive Living will;Healthcare [place] of [place] -  ?Does patient want to make changes to medical advance directive? No - Patient declined - - No - Patient declined No - Patient declined - -  ?Copy of [facility]  Chart? No - copy requested - - No - copy requested - - -  ?Would patient like information on creating a medical advance directive? - No - Patient declined No - Patient declined - - - Yes (ED - Information included in AVS)  ? ? ?Current Medications (verified) ?Outpatient Encounter Medications as of 01/28/2022  ?Medication Sig  ? Accu-Chek FastClix Lancets MISC USE THREE TIMES DAILY  ? ACCU-CHEK GUIDE test strip USE THREE TIMES DAILY  ? acetaminophen (TYLENOL) 500 MG tablet Take 1,000 mg by mouth every 6 (six) hours as needed for mild pain.  ? atorvastatin (LIPITOR) 40 MG tablet TAKE 1 TABLET (40 MG TOTAL) BY MOUTH DAILY.  ? Blood Glucose Monitoring Suppl (ACCU-CHEK AVIVA PLUS) w/Device KIT Use to check blood sugars daily ?Dx E11.9  ? carvedilol (COREG) 12.5 MG tablet TAKE 1 TABLET TWICE DAILY WITH MEALS  ? cetirizine (ZYRTEC) 10 MG tablet Take 10 mg by mouth daily.  ? Cholecalciferol 2000 units TABS Take 1 tablet (2,000 Units total) by mouth daily.  ? Continuous Blood Gluc Receiver (DEXCOM G6 RECEIVER) DEVI 1 Act by Does not apply route daily.  ? Continuous Blood Gluc Sensor (DEXCOM G6 SENSOR) MISC 1 Act by Does not apply route daily.  ? Continuous Blood Gluc Transmit (DEXCOM G6 TRANSMITTER) MISC 1 Act by Does not apply route daily.  ? Cyanocobalamin (VITAMIN B-12 CR PO) Take 1 tablet by mouth daily.  ? DROPLET PEN NEEDLES 32G X 6 MM MISC USE DAILY  ? empagliflozin (JARDIANCE) 25 MG TABS tablet Take 1 tablet (25 mg total) by mouth daily before breakfast.  ? Finerenone (KERENDIA) 10 MG TABS Take 1 tablet by mouth daily.  ? Glucagon (GVOKE  HYPOPEN 2-PACK) 1 MG/0.2ML SOAJ Inject 1 Act into the skin daily as needed.  ? indapamide (LOZOL) 1.25 MG tablet TAKE 1 TABLET EVERY DAY  ? insulin glargine (LANTUS SOLOSTAR) 100 UNIT/ML Solostar Pen Inject 50 Units into the skin daily.  ? metFORMIN (GLUCOPHAGE-XR) 750 MG 24 hr tablet TAKE 2 TABLETS EVERY DAY  WITH  BREAKFAST  ? olmesartan (BENICAR) 20 MG tablet TAKE 1 TABLET EVERY  DAY  ? ?No facility-administered encounter medications on file as of 01/28/2022.  ? ? ?Allergies (verified) ?Nifedipine, Amlodipine, Oxycodone, and Penicillins  ? ?History: ?Past Medical History:  ?Diagnosis Date  ? Closed dislocation of shoulder   ? unspecified site  ? DJD (degenerative joint disease) of knee   ? Right knee  ? DM (diabetes mellitus) ([place])   ? Hyperlipidemia   ? Hypertension   ? Obesity, morbid ([place])   ? OSA (obstructive sleep apnea) 02/03/2017  ? ?Past Surgical History:  ?Procedure Laterality Date  ? CHOLECYSTECTOMY  1989  ? KNEE ARTHROSCOPY  2010  ? LEFT/ Dr Sharol Given  ? TUBAL LIGATION  1979  ? ?Family History  ?Problem Relation Age of Onset  ? Heart disease Mother   ? Kidney disease Father   ? Aortic aneurysm Brother   ?     Survived rupture  ? ?Social History  ? ?Socioeconomic History  ? Marital status: Divorced  ?  Spouse name: Not on file  ? Number of children: 2  ? Years of education: Not on file  ? Highest education level: Not on file  ?Occupational History  ? Occupation: [employer]  ?  Comment: Secondary school teacher   ? Occupation: Estate manager/land agent  ?  Employer: [employer]  ?  Comment: RETIRED  ?Tobacco Use  ? Smoking status: Former  ?  Packs/day: 0.50  ?  Years: 25.00  ?  Pack years: 12.50  ?  Types: Cigarettes  ? Smokeless tobacco: Never  ?Vaping Use  ? Vaping Use: Never used  ?Substance and Sexual Activity  ? Alcohol use: No  ?  Alcohol/week: 0.0 standard drinks  ? Drug use: No  ? Sexual activity: Not Currently  ?Other Topics Concern  ? Not on file  ?Social History Narrative  ? RETIRED  ? Married 23 yrs, currently divorced  ? Daughter born '65, Son born '69; 2 grandchildren - 1 here, 51 in [place]  ? ?Social Determinants of Health  ? ?Financial Resource Strain: Low Risk   ? Difficulty of Paying Living Expenses: Not hard at all  ?Food Insecurity: No Food Insecurity  ? Worried About Charity fundraiser in the Last Year: Never true  ? Ran Out of Food in the Last Year: Never true  ?Transportation Needs: No  Transportation Needs  ? Lack of Transportation (Medical): No  ? Lack of Transportation (Non-Medical): No  ?Physical Activity: Inactive  ? Days of Exercise per Week: 0 days  ? Minutes of Exercise per Session: 0 min  ?Stress: No Stress Concern Present  ? Feeling of Stress : Not at all  ?Social Connections: Moderately Integrated  ? Frequency of Communication with Friends and Family: More than three times a week  ? Frequency of Social Gatherings with Friends and Family: More than three times a week  ? Attends Religious Services: More than 4 times per year  ? Active Member of Clubs or Organizations: Yes  ? Attends Archivist Meetings: More than 4 times per year  ? Marital Status: Divorced  ? ? ?Tobacco Counseling ?  Counseling given: Not Answered ? ? ?Clinical Intake: ? ?Pre-visit preparation completed: Yes ? ?Pain : No/denies pain ? ?  ? ?Nutritional Risks: None ?Diabetes: Yes ?CBG done?: No ?Did pt. bring in CBG monitor from home?: No ? ?How often do you need to have someone help you when you read instructions, pamphlets, or other written materials from your doctor or pharmacy?: 1 - Never ?What is the last grade level you completed in school?: HSG ? ?Diabetic? yes ? ?Interpreter Needed?: No ? ?Information entered by :: Lisette Abu, LPN ? ? ?Activities of Daily Living ?In your present state of health, do you have any difficulty performing the following activities: 01/28/2022 03/19/2021  ?Hearing? N N  ?Vision? N N  ?Difficulty concentrating or making decisions? N N  ?Walking or climbing stairs? N Y  ?Comment - uses cane  ?Dressing or bathing? N N  ?Doing errands, shopping? N N  ?Preparing Food and eating ? N N  ?Using the Toilet? N N  ?In the past six months, have you accidently leaked urine? Y N  ?Do you have problems with loss of bowel control? N N  ?Managing your Medications? N N  ?Managing your Finances? N N  ?Housekeeping or managing your Housekeeping? N N  ?Some recent data might be hidden  ? ? ?Patient  Care Team: ?[name], MD as PCP - General (Internal Medicine) ?Florance, Tomasa Blase, RN as [place] Management ? ?Indicate any recent Medical Services you may have recei

## 2022-02-14 ENCOUNTER — Other Ambulatory Visit: Payer: Self-pay

## 2022-02-14 NOTE — Patient Outreach (Signed)
[facility]) Care Management ? ?02/14/2022 ? ?Kathreen Cornfield ?[birth year] ?[id number] ? ? ?Telephone Assessment ? ? ?Unsuccessful quarterly outreach attempt to patient. ? ? ? ? ?Plan: ?RN CM will make quarterly outreach attempt to patient within the month of July. ? ?Enzo Montgomery, RN,BSN,CCM ?[facility] Care Management ?Telephonic Care Management Coordinator ?Direct Phone: [phone] ?Toll Free: [phone] ?Fax: [phone] ? ?

## 2022-02-24 ENCOUNTER — Ambulatory Visit (INDEPENDENT_AMBULATORY_CARE_PROVIDER_SITE_OTHER): Payer: Medicare PPO | Admitting: Internal Medicine

## 2022-02-24 ENCOUNTER — Encounter: Payer: Self-pay | Admitting: Internal Medicine

## 2022-02-24 VITALS — BP 144/88 | HR 68 | Temp 98.0°F | Ht 69.0 in | Wt 303.0 lb

## 2022-02-24 DIAGNOSIS — N1831 Chronic kidney disease, stage 3a: Secondary | ICD-10-CM | POA: Diagnosis not present

## 2022-02-24 DIAGNOSIS — E1121 Type 2 diabetes mellitus with diabetic nephropathy: Secondary | ICD-10-CM

## 2022-02-24 DIAGNOSIS — I10 Essential (primary) hypertension: Secondary | ICD-10-CM

## 2022-02-24 DIAGNOSIS — E118 Type 2 diabetes mellitus with unspecified complications: Secondary | ICD-10-CM | POA: Diagnosis not present

## 2022-02-24 LAB — HEMOGLOBIN A1C: Hgb A1c MFr Bld: 7.5 % — ABNORMAL HIGH (ref 4.6–6.5)

## 2022-02-24 MED ORDER — KERENDIA 20 MG PO TABS: 1.0000 | ORAL_TABLET | Freq: Every day | ORAL | 1 refills | Status: DC

## 2022-02-24 NOTE — Patient Instructions (Signed)
Type 2 Diabetes Mellitus, Diagnosis, Adult ?Type 2 diabetes (type 2 diabetes mellitus) is a long-term, or chronic, disease. In type 2 diabetes, one or both of these problems may be present: ?The pancreas does not make enough of a hormone called insulin. ?Cells in the body do not respond properly to the insulin that the body makes (insulin resistance). ?Normally, insulin allows blood sugar (glucose) to enter cells in the body. The cells use glucose for energy. Insulin resistance or lack of insulin causes excess glucose to build up in the blood instead of going into cells. This causes high blood glucose (hyperglycemia).  ?What are the causes? ?The exact cause of type 2 diabetes is not known. ?What increases the risk? ?The following factors may make you more likely to develop this condition: ?Having a family member with type 2 diabetes. ?Being overweight or obese. ?Being inactive (sedentary). ?Having been diagnosed with insulin resistance. ?Having a history of prediabetes, diabetes when you were pregnant (gestational diabetes), or polycystic ovary syndrome (PCOS). ?What are the signs or symptoms? ?In the early stage of this condition, you may not have symptoms. Symptoms develop slowly and may include: ?Increased thirst or hunger. ?Increased urination. ?Unexplained weight loss. ?Tiredness (fatigue) or weakness. ?Vision changes, such as blurry vision. ?Dark patches on the skin. ?How is this diagnosed? ?This condition is diagnosed based on your symptoms, your medical history, a physical exam, and your blood glucose level. Your blood glucose may be checked with one or more of the following blood tests: ?A fasting blood glucose (FBG) test. You will not be allowed to eat (you will fast) for 8 hours or longer before a blood sample is taken. ?A random blood glucose test. This test checks blood glucose at any time of day regardless of when you ate. ?An A1C (hemoglobin A1C) blood test. This test provides information about blood  glucose levels over the previous 2-3 months. ?An oral glucose tolerance test (OGTT). This test measures your blood glucose at two times: ?After fasting. This is your baseline blood glucose level. ?Two hours after drinking a beverage that contains glucose. ?You may be diagnosed with type 2 diabetes if: ?Your fasting blood glucose level is 126 mg/dL (7.0 mmol/L) or higher. ?Your random blood glucose level is 200 mg/dL (11.1 mmol/L) or higher. ?Your A1C level is 6.5% or higher. ?Your oral glucose tolerance test result is higher than 200 mg/dL (11.1 mmol/L). ?These blood tests may be repeated to confirm your diagnosis. ?How is this treated? ?Your treatment may be managed by a specialist called an endocrinologist. Type 2 diabetes may be treated by following instructions from your health care provider about: ?Making dietary and lifestyle changes. These may include: ?Following a personalized nutrition plan that is developed by a registered dietitian. ?Exercising regularly. ?Finding ways to manage stress. ?Checking your blood glucose level as often as told. ?Taking diabetes medicines or insulin daily. This helps to keep your blood glucose levels in the healthy range. ?Taking medicines to help prevent complications from diabetes. Medicines may include: ?Aspirin. ?Medicine to lower cholesterol. ?Medicine to control blood pressure. ?Your health care provider will set treatment goals for you. Your goals will be based on your age, other medical conditions you have, and how you respond to diabetes treatment. Generally, the goal of treatment is to maintain the following blood glucose levels: ?Before meals: 80-130 mg/dL (4.4-7.2 mmol/L). ?After meals: below 180 mg/dL (10 mmol/L). ?A1C level: less than 7%. ?Follow these instructions at home: ?Questions to ask your health care provider ?  Consider asking the following questions: ?Should I meet with a certified diabetes care and education specialist? ?What diabetes medicines do I need,  and when should I take them? ?What equipment will I need to manage my diabetes at home? ?How often do I need to check my blood glucose? ?Where can I find a support group for people with diabetes? ?What number can I call if I have questions? ?When is my next appointment? ?General instructions ?Take over-the-counter and prescription medicines only as told by your health care provider. ?Keep all follow-up visits. This is important. ?Where to find more information ?For help and guidance and for more information about diabetes, please visit: ?American Diabetes Association (ADA): www.diabetes.org ?American Association of Diabetes Care and Education Specialists (ADCES): www.diabeteseducator.org ?International Diabetes Federation (IDF): www.idf.org ?Contact a health care provider if: ?Your blood glucose is at or above 240 mg/dL (13.3 mmol/L) for 2 days in a row. ?You have been sick or have had a fever for 2 days or longer, and you are not getting better. ?You have any of the following problems for more than 6 hours: ?You cannot eat or drink. ?You have nausea and vomiting. ?You have diarrhea. ?Get help right away if: ?You have severe hypoglycemia. This means your blood glucose is lower than 54 mg/dL (3.0 mmol/L). ?You become confused or you have trouble thinking clearly. ?You have difficulty breathing. ?You have moderate or large ketone levels in your urine. ?These symptoms may represent a serious problem that is an emergency. Do not wait to see if the symptoms will go away. Get medical help right away. Call your local emergency services (911 in the [place] Do not drive yourself to the hospital. ?Summary ?Type 2 diabetes mellitus is a long-term, or chronic, disease. In type 2 diabetes, the pancreas does not make enough of a hormone called insulin, or cells in the body do not respond properly to insulin that the body makes. ?This condition is treated by making dietary and lifestyle changes and taking diabetes medicines or  insulin. ?Your health care provider will set treatment goals for you. Your goals will be based on your age, other medical conditions you have, and how you respond to diabetes treatment. ?Keep all follow-up visits. This is important. ?This information is not intended to replace advice given to you by your health care provider. Make sure you discuss any questions you have with your health care provider. ?Document Revised: 01/25/2021 Document Reviewed: 01/25/2021 ?Elsevier Patient Education ? 2022 Elsevier Inc. ? ?

## 2022-02-24 NOTE — Progress Notes (Signed)
? ?Subjective:  ?Patient ID: Holly [name], female    DOB: Apr 22, 1941  Age: 81 y.o. MRN: [id number] ? ?CC: Hypertension and Diabetes ? ? ?HPI ?RION SCHNITZER presents for f/up - ? ?She denies chest pain, shortness of breath, diaphoresis, or edema. ? ?Outpatient Medications Prior to Visit  ?Medication Sig Dispense Refill  ? Accu-Chek FastClix Lancets MISC USE THREE TIMES DAILY 306 each 1  ? ACCU-CHEK GUIDE test strip USE THREE TIMES DAILY 300 strip 1  ? acetaminophen (TYLENOL) 500 MG tablet Take 1,000 mg by mouth every 6 (six) hours as needed for mild pain.    ? Blood Glucose Monitoring Suppl (ACCU-CHEK AVIVA PLUS) w/Device KIT Use to check blood sugars daily ?Dx E11.9 1 kit 0  ? carvedilol (COREG) 12.5 MG tablet TAKE 1 TABLET TWICE DAILY WITH MEALS 180 tablet 0  ? cetirizine (ZYRTEC) 10 MG tablet Take 10 mg by mouth daily.    ? Cholecalciferol 2000 units TABS Take 1 tablet (2,000 Units total) by mouth daily. 90 tablet 1  ? Continuous Blood Gluc Receiver (DEXCOM G6 RECEIVER) DEVI 1 Act by Does not apply route daily. 1 each 5  ? Continuous Blood Gluc Sensor (DEXCOM G6 SENSOR) MISC 1 Act by Does not apply route daily. 1 each 5  ? Continuous Blood Gluc Transmit (DEXCOM G6 TRANSMITTER) MISC 1 Act by Does not apply route daily. 1 each 5  ? Cyanocobalamin (VITAMIN B-12 CR PO) Take 1 tablet by mouth daily.    ? DROPLET PEN NEEDLES 32G X 6 MM MISC USE DAILY 100 each 3  ? empagliflozin (JARDIANCE) 25 MG TABS tablet Take 1 tablet (25 mg total) by mouth daily before breakfast. 90 tablet 0  ? Glucagon (GVOKE HYPOPEN 2-PACK) 1 MG/0.2ML SOAJ Inject 1 Act into the skin daily as needed. 2 mL 5  ? indapamide (LOZOL) 1.25 MG tablet TAKE 1 TABLET EVERY DAY 90 tablet 1  ? insulin glargine (LANTUS SOLOSTAR) 100 UNIT/ML Solostar Pen Inject 50 Units into the skin daily. 45 mL 0  ? metFORMIN (GLUCOPHAGE-XR) 750 MG 24 hr tablet TAKE 2 TABLETS EVERY DAY  WITH  BREAKFAST 180 tablet 0  ? olmesartan (BENICAR) 20 MG tablet TAKE 1 TABLET EVERY  DAY 90 tablet 0  ? Finerenone (KERENDIA) 10 MG TABS Take 1 tablet by mouth daily. 30 tablet 0  ? atorvastatin (LIPITOR) 40 MG tablet TAKE 1 TABLET (40 MG TOTAL) BY MOUTH DAILY. 90 tablet 0  ? ?No facility-administered medications prior to visit.  ? ? ?ROS ?Review of Systems  ?Constitutional:  Negative for diaphoresis and fatigue.  ?HENT: Negative.    ?Eyes: Negative.   ?Respiratory:  Negative for cough, chest tightness, shortness of breath and wheezing.   ?Cardiovascular:  Negative for chest pain, palpitations and leg swelling.  ?Gastrointestinal:  Negative for abdominal pain, constipation, diarrhea, nausea and vomiting.  ?Endocrine: Negative.   ?Genitourinary: Negative.  Negative for difficulty urinating.  ?Musculoskeletal:  Positive for arthralgias. Negative for myalgias.  ?Skin: Negative.   ?Neurological:  Negative for dizziness, weakness, light-headedness and headaches.  ?Hematological:  Negative for adenopathy. Does not bruise/bleed easily.  ?Psychiatric/Behavioral: Negative.    ? ?Objective:  ?BP (!) 144/88 (BP Location: Left Arm, Patient Position: Sitting, Cuff Size: Large)   Pulse 68   Temp 98 ?F (36.7 ?C) (Oral)   Ht _0  (1.753 m)   Wt (!) 303 lb (137.4 kg)   SpO2 92%   BMI 44.75 kg/m?  ? ?BP Readings from Last 3  Encounters:  ?02/24/22 (!) 144/88  ?10/05/21 (!) 144/90  ?04/02/21 (!) 144/86  ? ? ?Wt Readings from Last 3 Encounters:  ?02/24/22 (!) 303 lb (137.4 kg)  ?10/05/21 (!) 310 lb (140.6 kg)  ?04/02/21 (!) 303 lb (137.4 kg)  ? ? ?Physical Exam ?Vitals reviewed.  ?Constitutional:   ?   Appearance: She is obese. She is not ill-appearing.  ?HENT:  ?   Nose: Nose normal.  ?   Mouth/Throat:  ?   Mouth: Mucous membranes are moist.  ?Eyes:  ?   General: No scleral icterus. ?   Conjunctiva/sclera: Conjunctivae normal.  ?Cardiovascular:  ?   Rate and Rhythm: Normal rate and regular rhythm.  ?   Heart sounds: No murmur heard. ?Pulmonary:  ?   Effort: Pulmonary effort is normal.  ?   Breath sounds: No  stridor. No wheezing, rhonchi or rales.  ?Abdominal:  ?   General: Abdomen is protuberant. Bowel sounds are normal. There is no distension.  ?   Palpations: Abdomen is soft. There is no hepatomegaly, splenomegaly or mass.  ?   Tenderness: There is no abdominal tenderness. There is no guarding.  ?Musculoskeletal:  ?   Cervical back: Neck supple.  ?Lymphadenopathy:  ?   Cervical: No cervical adenopathy.  ?Skin: ?   General: Skin is warm and dry.  ?Neurological:  ?   General: No focal deficit present.  ?   Mental Status: She is alert.  ?Psychiatric:     ?   Mood and Affect: Mood normal.     ?   Behavior: Behavior normal.  ? ? ?Lab Results  ?Component Value Date  ? WBC 6.2 10/05/2021  ? HGB 13.5 10/05/2021  ? HCT 42.6 10/05/2021  ? PLT 203.0 10/05/2021  ? GLUCOSE 127 (H) 10/05/2021  ? CHOL 170 10/05/2021  ? TRIG 96.0 10/05/2021  ? HDL 61.70 10/05/2021  ? LDLDIRECT 134.8 08/27/2010  ? [place] 90 10/05/2021  ? ALT 21 10/05/2021  ? AST 27 10/05/2021  ? NA 138 12/13/2021  ? K 3.9 12/13/2021  ? CL 99 12/13/2021  ? CREATININE 1.4 (A) 12/13/2021  ? BUN 36 (A) 12/13/2021  ? CO2 30 (A) 12/13/2021  ? TSH 2.69 10/05/2021  ? HGBA1C 7.5 (H) 02/24/2022  ? MICROALBUR 445.8 03/01/2021  ? ? ?US RENAL ? ?Result Date: 07/03/2020 ?CLINICAL DATA:  Stage III renal disease with proteinuria EXAM: RENAL / URINARY TRACT ULTRASOUND COMPLETE COMPARISON:  08/08/2014 FINDINGS: Right Kidney: Renal measurements: 11.7 x 4.2 x 7.0 cm. = volume: 182 mL. Echogenicity within normal limits. No mass or hydronephrosis visualized. Left Kidney: Renal measurements: 12.0 x 5.8 x 6.9 cm = volume: 252 mL. No mass lesion or hydronephrosis is noted. 3.6 cm simple cyst is noted in the lower pole of the left kidney relatively stable in appearance from the prior exam. Bladder: Appears normal for degree of bladder distention. Other: None. IMPRESSION: Simple cyst in the left kidney. No other focal abnormality is noted. Electronically Signed   By: Inez Catalina M.D.   On:  07/03/2020 08:12  ? ? ?Assessment & Plan:  ? ?Jocilyn was seen today for hypertension and diabetes. ? ?Diagnoses and all orders for this visit: ? ?Malignant hypertension- Her blood pressure is well controlled. ?-     Cancel: Basic metabolic panel; Future ?-     Cancel: Urinalysis, Routine w reflex microscopic; Future ? ?Type II diabetes mellitus with manifestations ([place])- Her blood sugar is adequately well controlled. ?-  Cancel: Microalbumin / creatinine urine ratio; Future ?-     Hemoglobin A1c; Future ?-     Finerenone (KERENDIA) 20 MG TABS; Take 1 tablet by mouth daily. ?-     Hemoglobin A1c ? ?Stage 3a chronic kidney disease ([place])- Will continue the MRA for renal protection. ?-     Cancel: Microalbumin / creatinine urine ratio; Future ?-     Cancel: Urinalysis, Routine w reflex microscopic; Future ?-     Finerenone (KERENDIA) 20 MG TABS; Take 1 tablet by mouth daily. ? ?Diabetic nephropathy associated with type 2 diabetes mellitus ([place]) ?-     Finerenone (KERENDIA) 20 MG TABS; Take 1 tablet by mouth daily. ? ? ?I have discontinued [name]. Querry's [place] Clamp. I am also having her start on Kerendia. Additionally, I am having her maintain her cetirizine, Cyanocobalamin (VITAMIN B-12 CR PO), Cholecalciferol, Accu-Chek Aviva Plus, Accu-Chek Guide, Accu-Chek FastClix Lancets, acetaminophen, Lantus SoloStar, Dexcom G6 Receiver, Dexcom G6 Transmitter, Dexcom G6 Sensor, Gvoke HypoPen 2-Pack, Droplet Pen Needles, carvedilol, olmesartan, empagliflozin, indapamide, atorvastatin, and metFORMIN. ? ?Meds ordered this encounter  ?Medications  ? Finerenone (KERENDIA) 20 MG TABS  ?  Sig: Take 1 tablet by mouth daily.  ?  Dispense:  90 tablet  ?  Refill:  1  ? ? ? ?Follow-up: Return in about 6 months (around 08/26/2022). ? ?Scarlette Calico, MD ?

## 2022-02-28 ENCOUNTER — Other Ambulatory Visit: Payer: Self-pay | Admitting: Internal Medicine

## 2022-02-28 DIAGNOSIS — I10 Essential (primary) hypertension: Secondary | ICD-10-CM

## 2022-02-28 DIAGNOSIS — E118 Type 2 diabetes mellitus with unspecified complications: Secondary | ICD-10-CM

## 2022-03-07 ENCOUNTER — Telehealth: Payer: Self-pay

## 2022-03-07 NOTE — Progress Notes (Signed)
? ? ? ? ?  Chronic Care Management ?Pharmacy Assistant  ? ?Name: Holly [name]  MRN: [id number] DOB: 1941/03/08 ? ? ?Contacted  [employer]   to follow up on patient assistance application for [place]. Per representative at  [employer]  that patient is missing [employer] card the red,white and blue one with the 11 digit number, this card needs to be faxed to [place] to finish processing the application. Called the patient and left message to return call. ? ?Ethelene Hal ?Clinical Pharmacist Assistant ?[phone]  ?

## 2022-03-09 NOTE — Telephone Encounter (Signed)
Pt will come by in the morning to bring card to make copy to be faxed  ?

## 2022-03-10 NOTE — Telephone Encounter (Signed)
PT Visits today with form containing fax number and I both scanned in and made a copy of her medicare card for her (front and back). Form and copy have been left in Dan's mail box! ?

## 2022-03-10 NOTE — Telephone Encounter (Signed)
Medicare card faxed to [facility] as requested  ? ?Tomasa Blase, PharmD ?Clinical Pharmacist, [place]  ? ? ?

## 2022-04-05 DIAGNOSIS — M47816 Spondylosis without myelopathy or radiculopathy, lumbar region: Secondary | ICD-10-CM | POA: Diagnosis not present

## 2022-04-12 DIAGNOSIS — M1711 Unilateral primary osteoarthritis, right knee: Secondary | ICD-10-CM | POA: Diagnosis not present

## 2022-04-12 DIAGNOSIS — M47816 Spondylosis without myelopathy or radiculopathy, lumbar region: Secondary | ICD-10-CM | POA: Diagnosis not present

## 2022-04-12 DIAGNOSIS — M25512 Pain in left shoulder: Secondary | ICD-10-CM | POA: Diagnosis not present

## 2022-04-20 ENCOUNTER — Other Ambulatory Visit: Payer: Self-pay

## 2022-04-20 NOTE — Patient Outreach (Signed)
[facility]) Care Management  04/20/2022  Holly [name] [birth year] [id number]    Case Closure   Multiple attempts to establish contact with patient without success.    Plan: RN CM will close case.  Enzo Montgomery, RN,BSN,CCM [place] Management Telephonic Care Management Coordinator Direct Phone: [phone] Toll Free: [phone] Fax: [phone]

## 2022-04-27 ENCOUNTER — Other Ambulatory Visit: Payer: Self-pay | Admitting: Internal Medicine

## 2022-04-27 DIAGNOSIS — I70211 Atherosclerosis of native arteries of extremities with intermittent claudication, right leg: Secondary | ICD-10-CM

## 2022-04-27 DIAGNOSIS — E785 Hyperlipidemia, unspecified: Secondary | ICD-10-CM

## 2022-05-13 ENCOUNTER — Telehealth: Payer: Self-pay | Admitting: Internal Medicine

## 2022-05-16 ENCOUNTER — Ambulatory Visit: Payer: Self-pay

## 2022-05-30 ENCOUNTER — Telehealth: Payer: Self-pay | Admitting: Internal Medicine

## 2022-06-01 ENCOUNTER — Other Ambulatory Visit: Payer: Self-pay | Admitting: Internal Medicine

## 2022-06-01 ENCOUNTER — Telehealth: Payer: Self-pay | Admitting: Pharmacist

## 2022-06-01 DIAGNOSIS — E118 Type 2 diabetes mellitus with unspecified complications: Secondary | ICD-10-CM

## 2022-06-01 DIAGNOSIS — E119 Type 2 diabetes mellitus without complications: Secondary | ICD-10-CM

## 2022-06-01 MED ORDER — LANTUS SOLOSTAR 100 UNIT/ML ~~LOC~~ SOPN: 50.0000 [IU] | PEN_INJECTOR | Freq: Every day | SUBCUTANEOUS | 0 refills | Status: DC

## 2022-06-01 NOTE — Progress Notes (Signed)
[facility]) Care Management  [place]   06/01/2022  Holly [name] 10-18-1941 [id number]  Reason for referral: Medication Assistance with [employer]  Referral source: [place] Management Social Worker Referral medication(s): Lantus Current insurance:[employer] PPO  HPI: Patient is a 81 year old female with multiple medical conditions including but not limited to:  hyperlipidemia, hypertension, sleep apnea, type 2 diabetes, overactive bladder, vitamin D deficiency, stage III kidney disease, and morbid obesity.    Patient expressed difficulty obtaining her Lantus refills through [employer]'s patient assistance program.   Objective: Allergies  Allergen Reactions   Nifedipine Other (See Comments)    Leg edema   Amlodipine Other (See Comments)    edema   Oxycodone     nausea   Penicillins Hives and Other (See Comments)    fever    Medications Reviewed Today     Reviewed by [name], MD (Physician) on 02/24/22 at [id number]  Med List Status: <None>   Medication Order Taking? Sig Documenting Provider Last Dose Status Informant  Accu-Chek FastClix Lancets MISC [id number] Yes USE THREE TIMES DAILY [name], MD Taking Active   ACCU-CHEK GUIDE test strip [id number] Yes USE THREE TIMES DAILY [name], MD Taking Active   acetaminophen (TYLENOL) 500 MG tablet [id number] Yes Take 1,000 mg by mouth every 6 (six) hours as needed for mild pain. [provider] Taking Active Self  atorvastatin (LIPITOR) 40 MG tablet [id number]  TAKE 1 TABLET (40 MG TOTAL) BY MOUTH DAILY. [name], MD  Active   Blood Glucose Monitoring Suppl (ACCU-CHEK AVIVA PLUS) w/Device KIT [id number] Yes Use to check blood sugars daily Dx E11.9 [name], MD Taking Active   carvedilol (COREG) 12.5 MG tablet [id number] Yes TAKE 1 TABLET TWICE DAILY WITH MEALS [name], MD Taking Active   cetirizine (ZYRTEC) 10 MG tablet [id number] Yes Take 10 mg by mouth daily. [provider] Taking Active Self           Med Note [name] D   Tue May 28, 2019 11:41 AM) Takes as needed  Cholecalciferol 2000 units TABS [id number] Yes Take 1 tablet (2,000 Units total) by mouth daily. [name], MD Taking Active            Med Note [name] Jun 19, 2019  3:23 PM)    Continuous Blood Gluc Receiver ([place]) DEVI [id number] Yes 1 Act by Does not apply route daily. [name], MD Taking Active   Continuous Blood Gluc Sensor ([place]) MISC [id number] Yes 1 Act by Does not apply route daily. [name], MD Taking Active   Continuous Blood Gluc Transmit (DEXCOM G6 TRANSMITTER) MISC [id number] Yes 1 Act by Does not apply route daily. [name], MD Taking Active   Cyanocobalamin (VITAMIN B-12 CR PO) [id number] Yes Take 1 tablet by mouth daily. [provider] Taking Active Self  DROPLET PEN NEEDLES 32G X 6 MM MISC [id number] Yes USE DAILY [name], MD Taking Active   empagliflozin (JARDIANCE) 25 MG TABS tablet [id number] Yes Take 1 tablet (25 mg total) by mouth daily before breakfast. [name], MD Taking Active   Finerenone [facility]) 20 MG TABS [id number] Yes Take 1 tablet by mouth daily. [name], MD  Active   Glucagon (GVOKE HYPOPEN 2-PACK) 1 MG/0.2ML Darden Palmer [id number] Yes Inject 1 Act into the skin daily as needed. Scarlette Calico  L, MD Taking Active   indapamide (LOZOL) 1.25 MG tablet [id number] Yes TAKE 1 TABLET EVERY DAY [name], MD Taking Active   insulin glargine (LANTUS SOLOSTAR) 100 UNIT/ML Solostar Pen [id number] Yes Inject 50 Units into the skin daily. [name], MD Taking Active   metFORMIN (GLUCOPHAGE-XR) 750 MG 24 hr tablet [id number] Yes TAKE 2 TABLETS EVERY DAY  WITH  BREAKFAST [name], MD Taking Active   olmesartan (BENICAR) 20 MG tablet [id number] Yes TAKE 1 TABLET EVERY DAY [name], MD Taking Active             Medication Assistance Findings:   Medication assistance needs identified: Patient has already been approved for [employer]'s Patient Assistance Program.  The program was called with the patient on conference call.  It was determined that a refill fax form is not required for refills to be processed.  The representative processed the patient's refill and set her up on automatic refills for the rest of the year.  However, she did say there is a shortage of Lantus in their warehouse so the patient's shipment would be delayed.  Ms. Szczerba said she would run out of Lantus in less than 1 week.    So that she would not be out of medication, I requested a one-time voucher.  The voucher will have to be taken to a local pharmacy and presented with a valid prescription.  Local Pharmacy the patient chose was: [place], [place] [id number]  Voucher Info for the pharmacy: [id number] [id number] Voucher ID [id number]    Plan: Note will be routed to Dr. Ronnald Ramp requesting a new prescription for Lantus be sent to the patient's pharmacy for a one time fill using the [employer] from [facility].  I requested the voucher to be sent to me via Fax from [place] so that I can send it to the patient's pharmacy and coach them through the billing (once the voucher is received).  Elayne Guerin, PharmD, [place] Clinical Pharmacist [phone]

## 2022-06-02 ENCOUNTER — Telehealth: Payer: Self-pay | Admitting: Pharmacist

## 2022-06-02 NOTE — Progress Notes (Signed)
Holly [name]) Care Management  Holly [name]   06/02/2022  Holly [name] 1941-05-10 [id number]  Reason for referral: Medication assistance  Referral source: Holly [name] Referral medication(s): Lantus Current insurance:[employer]ana   Objective: Allergies  Allergen Reactions   Nifedipine Other (See Comments)    Leg edema   Amlodipine Other (See Comments)    edema   Oxycodone     nausea   Penicillins Hives and Other (See Comments)    fever    Medications Reviewed Today     Reviewed by Holly Lima, MD (Physician) on 02/24/22 at [phone]  Med List Status: <None>   Medication Order Taking? Sig Documenting Provider Last Dose Status Informant  Accu-Chek FastClix Lancets MISC [id number] Yes USE THREE TIMES DAILY Holly Lima, MD Taking Active   ACCU-CHEK GUIDE test strip [id number] Yes USE THREE TIMES DAILY Holly Lima, MD Taking Active   acetaminophen (TYLENOL) 500 MG tablet [id number] Yes Take 1,000 mg by mouth every 6 (six) hours as needed for mild pain. [provider] Taking Active Self  atorvastatin (LIPITOR) 40 MG tablet [id number]  TAKE 1 TABLET (40 MG TOTAL) BY MOUTH DAILY. Holly Lima, MD  Active   Blood Glucose Monitoring Suppl (ACCU-CHEK AVIVA PLUS) w/Device KIT [id number] Yes Use to check blood sugars daily Dx E11.9 Holly Lima, MD Taking Active   carvedilol (COREG) 12.5 MG tablet [id number] Yes TAKE 1 TABLET TWICE DAILY WITH MEALS Holly Lima, MD Taking Active   cetirizine (ZYRTEC) 10 MG tablet [id number] Yes Take 10 mg by mouth daily. [provider] Taking Active Self           Med Note Holly [name] [facility] [name] D   Tue May 28, 2019 11:41 AM) Takes as needed  Cholecalciferol 2000 units TABS [id number] Yes Take 1 tablet (2,000 Units total) by mouth daily. Holly Lima, MD Taking Active            Med Note Holly [name] Jun 19, 2019  3:23 PM)    Continuous Blood Gluc Receiver ([place]) DEVI [id number] Yes 1 Act by Does  not apply route daily. Holly Lima, MD Taking Active   Continuous Blood Gluc Sensor ([place]) MISC [id number] Yes 1 Act by Does not apply route daily. Holly Lima, MD Taking Active   Continuous Blood Gluc Transmit (DEXCOM G6 TRANSMITTER) MISC [id number] Yes 1 Act by Does not apply route daily. Holly Lima, MD Taking Active   Cyanocobalamin (VITAMIN B-12 CR PO) [id number] Yes Take 1 tablet by mouth daily. [provider] Taking Active Self  DROPLET PEN NEEDLES 32G X 6 MM MISC [id number] Yes USE DAILY Holly Lima, MD Taking Active   empagliflozin (JARDIANCE) 25 MG TABS tablet [id number] Yes Take 1 tablet (25 mg total) by mouth daily before breakfast. Holly Lima, MD Taking Active   Finerenone [facility]) 20 MG TABS [id number] Yes Take 1 tablet by mouth daily. Holly Lima, MD  Active   Glucagon (GVOKE HYPOPEN 2-PACK) 1 MG/0.2ML Darden Palmer [id number] Yes Inject 1 Act into the skin daily as needed. Holly Lima, MD Taking Active   indapamide (LOZOL) 1.25 MG tablet [id number] Yes TAKE 1 TABLET EVERY DAY Holly Lima, MD Taking Active   insulin glargine (LANTUS SOLOSTAR) 100 UNIT/ML Solostar Pen [id number] Yes Inject 50 Units into the skin daily. Holly Lima, MD Taking Active   metFORMIN (GLUCOPHAGE-XR) 750 MG 24 hr tablet [id number] Yes  TAKE 2 TABLETS EVERY DAY  WITH  BREAKFAST Holly Lima, MD Taking Active   olmesartan (BENICAR) 20 MG tablet [id number] Yes TAKE 1 TABLET EVERY DAY Holly Lima, MD Taking Active             [place] Medication Assistance Findings:  Medication assistance needs identified: Medication assistance needs identified: Patient has already been approved for [employer]'s Patient Assistance Program.  The program was called with the patient on conference call.  It was determined that a refill fax form is not required for refills to be processed.  The representative processed the patient's refill and set her up on automatic refills for the rest of  the year.  However, she did say there is a shortage of Lantus in their warehouse so the patient's shipment would be delayed.  Holly [name] said she would run out of Lantus in less than 1 week.    Dr. Ronnald [name] sent a new prescription to Holly [name]. Spoke with the Pharmacist at [name] and coached her through correctly billing the coupon/voucher. Unfortunately, she could not get the claim to process correctly.  [employer] was called back.  There seemed to be an issue with the patient's date of birth in their system.  After being escalated to a manager and being tossed around for more than two hours, we were finally able to get it billed    Plan: Patient was called and told she could go to Holly [name] on [address] and told she can pick up her medication at no cost.   Holly [name], PharmD, [place] Pharmacist [phone]

## 2022-06-23 ENCOUNTER — Other Ambulatory Visit: Payer: Self-pay | Admitting: Internal Medicine

## 2022-06-23 DIAGNOSIS — R600 Localized edema: Secondary | ICD-10-CM

## 2022-06-23 DIAGNOSIS — I10 Essential (primary) hypertension: Secondary | ICD-10-CM

## 2022-06-27 DIAGNOSIS — N1831 Chronic kidney disease, stage 3a: Secondary | ICD-10-CM | POA: Diagnosis not present

## 2022-06-27 LAB — BASIC METABOLIC PANEL
BUN: 31 — AB (ref 4–21)
CO2: 22 (ref 13–22)
Chloride: 101 (ref 99–108)
Creatinine: 1.5 — AB (ref 0.5–1.1)
Glucose: 214
Potassium: 3.3 mEq/L — AB (ref 3.5–5.1)
Sodium: 138 (ref 137–147)

## 2022-06-27 LAB — COMPREHENSIVE METABOLIC PANEL
Albumin: 4.5 (ref 3.5–5.0)
Calcium: 10.2 (ref 8.7–10.7)
eGFR: 36

## 2022-06-30 ENCOUNTER — Other Ambulatory Visit: Payer: Self-pay | Admitting: Internal Medicine

## 2022-06-30 DIAGNOSIS — E118 Type 2 diabetes mellitus with unspecified complications: Secondary | ICD-10-CM

## 2022-07-08 ENCOUNTER — Other Ambulatory Visit: Payer: Self-pay | Admitting: Internal Medicine

## 2022-07-08 ENCOUNTER — Telehealth: Payer: Self-pay | Admitting: *Deleted

## 2022-07-08 DIAGNOSIS — D631 Anemia in chronic kidney disease: Secondary | ICD-10-CM | POA: Diagnosis not present

## 2022-07-08 DIAGNOSIS — N2581 Secondary hyperparathyroidism of renal origin: Secondary | ICD-10-CM | POA: Diagnosis not present

## 2022-07-08 DIAGNOSIS — E1122 Type 2 diabetes mellitus with diabetic chronic kidney disease: Secondary | ICD-10-CM | POA: Diagnosis not present

## 2022-07-08 DIAGNOSIS — R809 Proteinuria, unspecified: Secondary | ICD-10-CM | POA: Diagnosis not present

## 2022-07-08 DIAGNOSIS — I129 Hypertensive chronic kidney disease with stage 1 through stage 4 chronic kidney disease, or unspecified chronic kidney disease: Secondary | ICD-10-CM | POA: Diagnosis not present

## 2022-07-08 DIAGNOSIS — N1832 Chronic kidney disease, stage 3b: Secondary | ICD-10-CM | POA: Diagnosis not present

## 2022-07-08 NOTE — Telephone Encounter (Signed)
Called patient and inform her that her Lantus patient assistance is ready for pick up . Patient medication is in fridge in nursing station

## 2022-07-14 LAB — MICROALBUMIN / CREATININE URINE RATIO: Microalb Creat Ratio: 37.4

## 2022-07-14 LAB — PROTEIN / CREATININE RATIO, URINE: Albumin, U: 222

## 2022-07-27 ENCOUNTER — Other Ambulatory Visit: Payer: Self-pay | Admitting: Internal Medicine

## 2022-07-27 DIAGNOSIS — E118 Type 2 diabetes mellitus with unspecified complications: Secondary | ICD-10-CM

## 2022-07-27 DIAGNOSIS — E1121 Type 2 diabetes mellitus with diabetic nephropathy: Secondary | ICD-10-CM

## 2022-07-27 DIAGNOSIS — N1831 Chronic kidney disease, stage 3a: Secondary | ICD-10-CM

## 2022-09-28 ENCOUNTER — Other Ambulatory Visit: Payer: Self-pay | Admitting: Internal Medicine

## 2022-09-28 DIAGNOSIS — I70211 Atherosclerosis of native arteries of extremities with intermittent claudication, right leg: Secondary | ICD-10-CM

## 2022-09-28 DIAGNOSIS — E785 Hyperlipidemia, unspecified: Secondary | ICD-10-CM

## 2022-10-03 ENCOUNTER — Other Ambulatory Visit: Payer: Self-pay | Admitting: Internal Medicine

## 2022-10-03 DIAGNOSIS — E118 Type 2 diabetes mellitus with unspecified complications: Secondary | ICD-10-CM

## 2022-10-05 ENCOUNTER — Encounter: Payer: Self-pay | Admitting: Internal Medicine

## 2022-10-05 ENCOUNTER — Ambulatory Visit (INDEPENDENT_AMBULATORY_CARE_PROVIDER_SITE_OTHER): Payer: Medicare PPO | Admitting: Internal Medicine

## 2022-10-05 VITALS — BP 152/88 | HR 68 | Temp 97.7°F | Resp 16 | Ht 69.0 in | Wt 309.0 lb

## 2022-10-05 DIAGNOSIS — E118 Type 2 diabetes mellitus with unspecified complications: Secondary | ICD-10-CM

## 2022-10-05 DIAGNOSIS — R801 Persistent proteinuria, unspecified: Secondary | ICD-10-CM

## 2022-10-05 DIAGNOSIS — I10 Essential (primary) hypertension: Secondary | ICD-10-CM

## 2022-10-05 DIAGNOSIS — E785 Hyperlipidemia, unspecified: Secondary | ICD-10-CM | POA: Diagnosis not present

## 2022-10-05 DIAGNOSIS — N1831 Chronic kidney disease, stage 3a: Secondary | ICD-10-CM | POA: Diagnosis not present

## 2022-10-05 DIAGNOSIS — Z0001 Encounter for general adult medical examination with abnormal findings: Secondary | ICD-10-CM

## 2022-10-05 DIAGNOSIS — Z0000 Encounter for general adult medical examination without abnormal findings: Secondary | ICD-10-CM

## 2022-10-05 NOTE — Progress Notes (Addendum)
Subjective:  Patient ID: CORETHA CRESWELL, female    DOB: 1941/01/04  Age: 81 y.o. MRN: [id number]  CC: Annual Exam, Hypertension, Hyperlipidemia, and Diabetes   HPI SAMIA KUKLA presents for a CPX and f/up - She walks and does not experience chest pain, shortness of breath, diaphoresis, dizziness, or lightheadedness.     Outpatient Medications Prior to Visit  Medication Sig Dispense Refill   Accu-Chek FastClix Lancets MISC USE THREE TIMES DAILY 306 each 1   ACCU-CHEK GUIDE test strip USE THREE TIMES DAILY 300 strip 1   acetaminophen (TYLENOL) 500 MG tablet Take 1,000 mg by mouth every 6 (six) hours as needed for mild pain.     atorvastatin (LIPITOR) 40 MG tablet TAKE 1 TABLET EVERY DAY 90 tablet 10   Blood Glucose Monitoring Suppl (ACCU-CHEK AVIVA PLUS) w/Device KIT Use to check blood sugars daily Dx E11.9 1 kit 0   carvedilol (COREG) 12.5 MG tablet TAKE 1 TABLET TWICE DAILY WITH MEALS 180 tablet 1   cetirizine (ZYRTEC) 10 MG tablet Take 10 mg by mouth daily.     Cholecalciferol 2000 units TABS Take 1 tablet (2,000 Units total) by mouth daily. 90 tablet 1   Continuous Blood Gluc Receiver (DEXCOM G6 RECEIVER) DEVI 1 Act by Does not apply route daily. 1 each 5   Continuous Blood Gluc Sensor (DEXCOM G6 SENSOR) MISC 1 Act by Does not apply route daily. 1 each 5   Continuous Blood Gluc Transmit (DEXCOM G6 TRANSMITTER) MISC 1 Act by Does not apply route daily. 1 each 5   Cyanocobalamin (VITAMIN B-12 CR PO) Take 1 tablet by mouth daily.     Glucagon (GVOKE HYPOPEN 2-PACK) 1 MG/0.2ML SOAJ Inject 1 Act into the skin daily as needed. 2 mL 5   indapamide (LOZOL) 1.25 MG tablet TAKE 1 TABLET EVERY DAY 90 tablet 1   insulin glargine (LANTUS SOLOSTAR) 100 UNIT/ML Solostar Pen Inject 50 Units into the skin daily. 45 mL 0   Insulin Pen Needle (DROPLET PEN NEEDLES) 32G X 6 MM MISC USE DAILY 100 each 5   metFORMIN (GLUCOPHAGE-XR) 750 MG 24 hr tablet TAKE 2 TABLETS EVERY DAY WITH BREAKFAST 180 tablet  0   olmesartan (BENICAR) 20 MG tablet TAKE 1 TABLET EVERY DAY 90 tablet 1   empagliflozin (JARDIANCE) 25 MG TABS tablet Take 1 tablet (25 mg total) by mouth daily before breakfast. 90 tablet 0   KERENDIA 20 MG TABS TAKE 1 TABLET EVERY DAY 90 tablet 1   No facility-administered medications prior to visit.    ROS Review of Systems  Constitutional:  Negative for chills, diaphoresis, fatigue and fever.  HENT: Negative.    Eyes: Negative.   Respiratory: Negative.  Negative for cough, chest tightness, shortness of breath and wheezing.   Cardiovascular:  Negative for chest pain, palpitations and leg swelling.  Gastrointestinal:  Negative for abdominal pain, constipation, diarrhea and nausea.  Endocrine: Positive for polyuria.  Genitourinary:  Positive for frequency. Negative for difficulty urinating, dysuria, flank pain, hematuria and urgency.  Musculoskeletal:  Positive for arthralgias and back pain. Negative for myalgias and neck pain.  Neurological: Negative.  Negative for weakness.  Hematological:  Negative for adenopathy. Does not bruise/bleed easily.  Psychiatric/Behavioral: Negative.      Objective:  BP (!) 152/88 (BP Location: Left Arm, Patient Position: Sitting, Cuff Size: Large)   Pulse 68   Temp 97.7 F (36.5 C) (Oral)   Resp 16   Ht _0  (1.753 m)  Wt (!) 309 lb (140.2 kg)   SpO2 96%   BMI 45.63 kg/m   BP Readings from Last 3 Encounters:  10/05/22 (!) 152/88  02/24/22 (!) 144/88  10/05/21 (!) 144/90    Wt Readings from Last 3 Encounters:  10/05/22 (!) 309 lb (140.2 kg)  02/24/22 (!) 303 lb (137.4 kg)  10/05/21 (!) 310 lb (140.6 kg)      Lab Results  Component Value Date   WBC 5.4 11/25/2022   HGB 13.4 11/25/2022   HCT 41.8 11/25/2022   PLT 212.0 11/25/2022   GLUCOSE 219 (H) 11/25/2022   CHOL 176 12/01/2022   TRIG 152.0 (H) 12/01/2022   HDL 61.20 12/01/2022   LDLDIRECT 134.8 08/27/2010   LDLCALC 84 12/01/2022   ALT 21 12/01/2022   AST 21 12/01/2022    NA 134 (L) 11/25/2022   K 4.3 11/25/2022   CL 99 11/25/2022   CREATININE 1.27 (H) 11/25/2022   BUN 27 (H) 11/25/2022   CO2 26 11/25/2022   TSH 2.46 12/01/2022   HGBA1C 8.7 (H) 12/01/2022   MICROALBUR 445.8 03/01/2021    US RENAL  Result Date: 07/03/2020 CLINICAL DATA:  Stage III renal disease with proteinuria EXAM: RENAL / URINARY TRACT ULTRASOUND COMPLETE COMPARISON:  08/08/2014 FINDINGS: Right Kidney: Renal measurements: 11.7 x 4.2 x 7.0 cm. = volume: 182 mL. Echogenicity within normal limits. No mass or hydronephrosis visualized. Left Kidney: Renal measurements: 12.0 x 5.8 x 6.9 cm = volume: 252 mL. No mass lesion or hydronephrosis is noted. 3.6 cm simple cyst is noted in the lower pole of the left kidney relatively stable in appearance from the prior exam. Bladder: Appears normal for degree of bladder distention. Other: None. IMPRESSION: Simple cyst in the left kidney. No other focal abnormality is noted. Electronically Signed   By: Inez Catalina M.D.   On: 07/03/2020 08:12    Assessment & Plan:   Kiyla was seen today for annual exam, hypertension, hyperlipidemia and diabetes.  Diagnoses and all orders for this visit:  Type II diabetes mellitus with manifestations ([place])- I will monitor her A1c. -     Hemoglobin A1c; Future -     Cancel: Basic metabolic panel; Future -     Cancel: Microalbumin / creatinine urine ratio; Future -     HM Diabetes Foot Exam  Malignant hypertension- Her blood pressure is adequately well-controlled. -     TSH; Future -     Cancel: Urinalysis, Routine w reflex microscopic; Future -     Cancel: CBC with Differential/Platelet; Future -     EKG 12-Lead  Stage 3a chronic kidney disease ([place])- Her renal function is stable.  She is followed by nephrology. -     Cancel: Urinalysis, Routine w reflex microscopic; Future -     Cancel: Basic metabolic panel; Future -     Cancel: Microalbumin / creatinine urine ratio; Future  Hyperlipidemia with target LDL  less than 100- I will monitor her fasting lipid panel. -     Lipid panel; Future -     TSH; Future -     Hepatic function panel; Future  Persistent proteinuria -     Cancel: Urinalysis, Routine w reflex microscopic; Future -     Cancel: Microalbumin / creatinine urine ratio; Future  Encounter for general adult medical examination with abnormal findings- Exam completed, labs reviewed, vaccines are up-to-date, no cancer screenings indicated, patient education was given.   I am having [name]. Elpidio Eric maintain her cetirizine, Cyanocobalamin (  VITAMIN B-12 CR PO), Cholecalciferol, Accu-Chek Aviva Plus, Accu-Chek Guide, Accu-Chek FastClix Lancets, acetaminophen, Dexcom G6 Receiver, Dexcom G6 Transmitter, Dexcom G6 Sensor, Gvoke HypoPen 2-Pack, carvedilol, olmesartan, Lantus SoloStar, indapamide, metFORMIN, atorvastatin, and Droplet Pen Needles.  No orders of the defined types were placed in this encounter.    Follow-up: Return in about 6 months (around 04/05/2023).  Scarlette Calico, MD

## 2022-10-05 NOTE — Patient Instructions (Signed)

## 2022-10-07 ENCOUNTER — Other Ambulatory Visit: Payer: Self-pay | Admitting: Internal Medicine

## 2022-10-10 ENCOUNTER — Other Ambulatory Visit: Payer: Self-pay

## 2022-10-10 ENCOUNTER — Telehealth: Payer: Self-pay

## 2022-10-10 DIAGNOSIS — E118 Type 2 diabetes mellitus with unspecified complications: Secondary | ICD-10-CM

## 2022-10-10 DIAGNOSIS — E1121 Type 2 diabetes mellitus with diabetic nephropathy: Secondary | ICD-10-CM

## 2022-10-10 DIAGNOSIS — N1831 Chronic kidney disease, stage 3a: Secondary | ICD-10-CM

## 2022-10-10 MED ORDER — KERENDIA 20 MG PO TABS: 1.0000 | ORAL_TABLET | Freq: Every day | ORAL | 3 refills | Status: DC

## 2022-10-10 NOTE — Telephone Encounter (Signed)
All 3 applications have been signed and faxed back with successful confirmation.

## 2022-10-10 NOTE — Telephone Encounter (Signed)
2024 Patient Assistance Program Applications received from:  [place] for [employer] for [place] for [place]  All 3 applications have been completed and given to PCP to review and sign.

## 2022-10-12 NOTE — Telephone Encounter (Signed)
LVM for pt stating that Holly [name] responded stating that proof of income is needed in order to process application.   Edit 10/13/22- Pt has brought proof of income. IT has been faxed to [facility] for processing.

## 2022-10-17 NOTE — Telephone Encounter (Signed)
[employer] Application approved for 2024  Determination sent to scan.

## 2022-10-25 DIAGNOSIS — M1711 Unilateral primary osteoarthritis, right knee: Secondary | ICD-10-CM | POA: Diagnosis not present

## 2022-10-25 DIAGNOSIS — M1712 Unilateral primary osteoarthritis, left knee: Secondary | ICD-10-CM | POA: Diagnosis not present

## 2022-10-25 DIAGNOSIS — M47816 Spondylosis without myelopathy or radiculopathy, lumbar region: Secondary | ICD-10-CM | POA: Diagnosis not present

## 2022-10-25 DIAGNOSIS — M170 Bilateral primary osteoarthritis of knee: Secondary | ICD-10-CM | POA: Diagnosis not present

## 2022-11-03 NOTE — Telephone Encounter (Signed)
Pt's son has picked up medication

## 2022-11-04 DIAGNOSIS — M25552 Pain in left hip: Secondary | ICD-10-CM | POA: Diagnosis not present

## 2022-11-04 DIAGNOSIS — M1711 Unilateral primary osteoarthritis, right knee: Secondary | ICD-10-CM | POA: Diagnosis not present

## 2022-11-04 DIAGNOSIS — M25551 Pain in right hip: Secondary | ICD-10-CM | POA: Diagnosis not present

## 2022-11-04 DIAGNOSIS — M170 Bilateral primary osteoarthritis of knee: Secondary | ICD-10-CM | POA: Diagnosis not present

## 2022-11-10 ENCOUNTER — Other Ambulatory Visit: Payer: Self-pay | Admitting: Internal Medicine

## 2022-11-10 ENCOUNTER — Telehealth: Payer: Self-pay | Admitting: Internal Medicine

## 2022-11-10 DIAGNOSIS — E118 Type 2 diabetes mellitus with unspecified complications: Secondary | ICD-10-CM

## 2022-11-10 DIAGNOSIS — N1831 Chronic kidney disease, stage 3a: Secondary | ICD-10-CM

## 2022-11-10 DIAGNOSIS — E1121 Type 2 diabetes mellitus with diabetic nephropathy: Secondary | ICD-10-CM

## 2022-11-10 MED ORDER — EMPAGLIFLOZIN 25 MG PO TABS: 25.0000 mg | ORAL_TABLET | Freq: Every day | ORAL | 0 refills | Status: DC

## 2022-11-10 MED ORDER — KERENDIA 20 MG PO TABS: 1.0000 | ORAL_TABLET | Freq: Every day | ORAL | 0 refills | Status: DC

## 2022-11-10 NOTE — Telephone Encounter (Signed)
Caller & Relationship to patient:  Patient   Call back number:[phone]   Date of last office visit:10/05/2022   Date of next office visit:   Medication(s) to be refilled:  Carrington Clamp and Geneticist, molecular        Preferred Pharmacy:  [facility] on [address].

## 2022-11-11 ENCOUNTER — Telehealth: Payer: Self-pay

## 2022-11-11 NOTE — Telephone Encounter (Signed)
Pt states the cost for Finerenone (KERENDIA) 20 MG TABS is $500 and she can not afford to pay this every refill. She also states Jardiance cost a lot as well. She states can alternatives to both meds please be in please.  Pt states she used the discount cards and all and it still was to much to afford.

## 2022-11-21 ENCOUNTER — Other Ambulatory Visit (HOSPITAL_COMMUNITY): Payer: Self-pay | Admitting: Physical Medicine and Rehabilitation

## 2022-11-21 ENCOUNTER — Ambulatory Visit (HOSPITAL_COMMUNITY)
Admission: RE | Admit: 2022-11-21 | Discharge: 2022-11-21 | Disposition: A | Discharge status: Home / Self Care | Payer: Medicare PPO | Source: Ambulatory Visit | Attending: Physical Medicine and Rehabilitation | Admitting: Physical Medicine and Rehabilitation

## 2022-11-21 ENCOUNTER — Other Ambulatory Visit: Payer: Self-pay | Admitting: Internal Medicine

## 2022-11-21 DIAGNOSIS — M7989 Other specified soft tissue disorders: Secondary | ICD-10-CM | POA: Diagnosis not present

## 2022-11-21 DIAGNOSIS — M1711 Unilateral primary osteoarthritis, right knee: Secondary | ICD-10-CM | POA: Diagnosis not present

## 2022-11-21 DIAGNOSIS — M170 Bilateral primary osteoarthritis of knee: Secondary | ICD-10-CM | POA: Diagnosis not present

## 2022-11-21 DIAGNOSIS — M1712 Unilateral primary osteoarthritis, left knee: Secondary | ICD-10-CM | POA: Diagnosis not present

## 2022-11-21 NOTE — Progress Notes (Signed)
Right lower extremity venous duplex has been completed. Preliminary results can be found in CV Proc through chart review.  Results were given to [facility] at Dr. Leland Her office.  11/21/22 1:38 PM Holly [name] RVT

## 2022-11-21 NOTE — Telephone Encounter (Signed)
Patient daughter in law called stating patient hasn't had her Jardiance in over 7 days, but the medication is way too expensive. She states patient doesn't need a refill on it she just needs something different that would help. Patient can be reached at [phone], and daughter in law can be reached at [phone].

## 2022-11-21 NOTE — Telephone Encounter (Signed)
Pt's daughter in law stated that she can not afford it and she received determination that she is not eligible/qualify for patient assistance for jaridance for this year. Daughter in law stated that she works in Adult nurse ans stated that pt needs to be Rx'd a generic med because that is what she can afford. She would like for PCP to prescribe glipizide or metformin stating that pt should not have to go without med if she can not afford it. Please advise.

## 2022-11-23 NOTE — Telephone Encounter (Signed)
Pt's daughter in law has been informed.  She stated that "its heartless and inconsiderate that he is not willing to prescribe her anything". Daughter in law expressed that it is going on 2 weeks that the pt has not taken any medication and it is unfair that the patient is being made to choose between a $600/mo drug that she can not afford and living expenses. Pts daughter in law stated "Vania Rea has not always been around. So what was he prescribing for patients prior to?" Daughter in law stated that the family is unsatisfied and needs to know what is PCP going to to for the pt and needs "his patient care to kick in". I informed daughter in law that I would discuss options with PCP.   Please advise.

## 2022-11-24 NOTE — Telephone Encounter (Signed)
Pt's daughter in law has been informed that labs are needed. Pt will come next week to have those drawn.   Daughter in law also stated that they have done patient assistance again with [employer] to see if they will approve her Jardiance. Pt should receive a determination within 7-10 business days. Daughter in law mentioned that he will reach back up to PCP office the receive determination on what the next steps would be in case it is denied.

## 2022-11-25 ENCOUNTER — Other Ambulatory Visit (INDEPENDENT_AMBULATORY_CARE_PROVIDER_SITE_OTHER): Payer: Medicare PPO

## 2022-11-25 DIAGNOSIS — N1831 Chronic kidney disease, stage 3a: Secondary | ICD-10-CM | POA: Diagnosis not present

## 2022-11-25 DIAGNOSIS — I10 Essential (primary) hypertension: Secondary | ICD-10-CM

## 2022-11-25 LAB — CBC WITH DIFFERENTIAL/PLATELET
Basophils Absolute: 0 10*3/uL (ref 0.0–0.1)
Basophils Relative: 0.8 % (ref 0.0–3.0)
Eosinophils Absolute: 0.1 10*3/uL (ref 0.0–0.7)
Eosinophils Relative: 2.7 % (ref 0.0–5.0)
HCT: 41.8 % (ref 36.0–46.0)
Hemoglobin: 13.4 g/dL (ref 12.0–15.0)
Lymphocytes Relative: 27.6 % (ref 12.0–46.0)
Lymphs Abs: 1.5 10*3/uL (ref 0.7–4.0)
MCHC: 32.2 g/dL (ref 30.0–36.0)
MCV: 85.8 fl (ref 78.0–100.0)
Monocytes Absolute: 0.4 10*3/uL (ref 0.1–1.0)
Monocytes Relative: 8.1 % (ref 3.0–12.0)
Neutro Abs: 3.3 10*3/uL (ref 1.4–7.7)
Neutrophils Relative %: 60.8 % (ref 43.0–77.0)
Platelets: 212 10*3/uL (ref 150.0–400.0)
RBC: 4.87 Mil/uL (ref 3.87–5.11)
RDW: 13.7 % (ref 11.5–15.5)
WBC: 5.4 10*3/uL (ref 4.0–10.5)

## 2022-11-25 LAB — BASIC METABOLIC PANEL
BUN: 27 mg/dL — ABNORMAL HIGH (ref 6–23)
CO2: 26 mEq/L (ref 19–32)
Calcium: 9.4 mg/dL (ref 8.4–10.5)
Chloride: 99 mEq/L (ref 96–112)
Creatinine, Ser: 1.27 mg/dL — ABNORMAL HIGH (ref 0.40–1.20)
GFR: 40.22 mL/min — ABNORMAL LOW (ref >60.00)
Glucose, Bld: 219 mg/dL — ABNORMAL HIGH (ref 70–99)
Potassium: 4.3 mEq/L (ref 3.5–5.1)
Sodium: 134 mEq/L — ABNORMAL LOW (ref 135–145)

## 2022-11-25 NOTE — Telephone Encounter (Addendum)
BOTH APPROVAL LETTERS ARE IN CHART(MEDIA)   Received notification from [facility] regarding approval for LANTUS SOLOSTAR. Patient assistance approved from 11/23/2022 to 11/14/2023.  Phone: [phone]  Received notification from [employer]) regarding approval for [employer]. Patient assistance approved from 11/23/2022 to 11/14/2023.  Phone: [phone]  Steve Youngberg L. CPhT Rx Patient Advocate

## 2022-11-25 NOTE — Telephone Encounter (Signed)
Pt's daughter in law has been informed.

## 2022-11-25 NOTE — Telephone Encounter (Signed)
Daughter in law has been informed that [facility] application has been approved for 2024.  See 10/07/22 phone note

## 2022-11-28 DIAGNOSIS — M1712 Unilateral primary osteoarthritis, left knee: Secondary | ICD-10-CM | POA: Diagnosis not present

## 2022-11-28 DIAGNOSIS — M170 Bilateral primary osteoarthritis of knee: Secondary | ICD-10-CM | POA: Diagnosis not present

## 2022-11-28 DIAGNOSIS — M1711 Unilateral primary osteoarthritis, right knee: Secondary | ICD-10-CM | POA: Diagnosis not present

## 2022-11-30 ENCOUNTER — Other Ambulatory Visit: Payer: Self-pay | Admitting: Internal Medicine

## 2022-11-30 DIAGNOSIS — E118 Type 2 diabetes mellitus with unspecified complications: Secondary | ICD-10-CM

## 2022-11-30 NOTE — Addendum Note (Signed)
Addended by: [name] on: 11/30/2022 10:59 AM   Modules accepted: Orders

## 2022-12-01 ENCOUNTER — Other Ambulatory Visit (INDEPENDENT_AMBULATORY_CARE_PROVIDER_SITE_OTHER): Payer: Medicare PPO

## 2022-12-01 DIAGNOSIS — E785 Hyperlipidemia, unspecified: Secondary | ICD-10-CM | POA: Diagnosis not present

## 2022-12-01 DIAGNOSIS — I10 Essential (primary) hypertension: Secondary | ICD-10-CM

## 2022-12-01 DIAGNOSIS — E118 Type 2 diabetes mellitus with unspecified complications: Secondary | ICD-10-CM | POA: Diagnosis not present

## 2022-12-01 LAB — HEPATIC FUNCTION PANEL
ALT: 21 U/L (ref 0–35)
AST: 21 U/L (ref 0–37)
Albumin: 4.1 g/dL (ref 3.5–5.2)
Alkaline Phosphatase: 92 U/L (ref 39–117)
Bilirubin, Direct: 0.2 mg/dL (ref 0.0–0.3)
Total Bilirubin: 1.2 mg/dL (ref 0.2–1.2)
Total Protein: 7.4 g/dL (ref 6.0–8.3)

## 2022-12-01 LAB — LIPID PANEL
Cholesterol: 176 mg/dL (ref 0–200)
HDL: 61.2 mg/dL (ref >39.00)
LDL Cholesterol: 84 mg/dL (ref 0–99)
NonHDL: 114.65
Total CHOL/HDL Ratio: 3
Triglycerides: 152 mg/dL — ABNORMAL HIGH (ref 0.0–149.0)
VLDL: 30.4 mg/dL (ref 0.0–40.0)

## 2022-12-01 LAB — HEMOGLOBIN A1C: Hgb A1c MFr Bld: 8.7 % — ABNORMAL HIGH (ref 4.6–6.5)

## 2022-12-01 LAB — TSH: TSH: 2.46 u[IU]/mL (ref 0.35–5.50)

## 2022-12-02 ENCOUNTER — Other Ambulatory Visit: Payer: Self-pay | Admitting: Internal Medicine

## 2022-12-02 DIAGNOSIS — E119 Type 2 diabetes mellitus without complications: Secondary | ICD-10-CM

## 2022-12-02 MED ORDER — METFORMIN HCL ER 750 MG PO TB24: 750.0000 mg | ORAL_TABLET | Freq: Every day | ORAL | 1 refills | Status: DC

## 2022-12-02 MED ORDER — LANTUS SOLOSTAR 100 UNIT/ML ~~LOC~~ SOPN: 60.0000 [IU] | PEN_INJECTOR | Freq: Every day | SUBCUTANEOUS | 1 refills | Status: DC

## 2022-12-02 NOTE — Addendum Note (Signed)
Addended by: [name] on: 12/02/2022 08:12 AM   Modules accepted: Orders

## 2022-12-05 DIAGNOSIS — M170 Bilateral primary osteoarthritis of knee: Secondary | ICD-10-CM | POA: Diagnosis not present

## 2022-12-05 DIAGNOSIS — M1711 Unilateral primary osteoarthritis, right knee: Secondary | ICD-10-CM | POA: Diagnosis not present

## 2022-12-05 DIAGNOSIS — M1712 Unilateral primary osteoarthritis, left knee: Secondary | ICD-10-CM | POA: Diagnosis not present

## 2022-12-20 DIAGNOSIS — N1832 Chronic kidney disease, stage 3b: Secondary | ICD-10-CM | POA: Diagnosis not present

## 2022-12-27 DIAGNOSIS — E669 Obesity, unspecified: Secondary | ICD-10-CM | POA: Diagnosis not present

## 2022-12-27 DIAGNOSIS — I129 Hypertensive chronic kidney disease with stage 1 through stage 4 chronic kidney disease, or unspecified chronic kidney disease: Secondary | ICD-10-CM | POA: Diagnosis not present

## 2022-12-27 DIAGNOSIS — E785 Hyperlipidemia, unspecified: Secondary | ICD-10-CM | POA: Diagnosis not present

## 2022-12-27 DIAGNOSIS — D631 Anemia in chronic kidney disease: Secondary | ICD-10-CM | POA: Diagnosis not present

## 2022-12-27 DIAGNOSIS — R809 Proteinuria, unspecified: Secondary | ICD-10-CM | POA: Diagnosis not present

## 2022-12-27 DIAGNOSIS — N2581 Secondary hyperparathyroidism of renal origin: Secondary | ICD-10-CM | POA: Diagnosis not present

## 2022-12-27 DIAGNOSIS — E1122 Type 2 diabetes mellitus with diabetic chronic kidney disease: Secondary | ICD-10-CM | POA: Diagnosis not present

## 2022-12-27 DIAGNOSIS — N1832 Chronic kidney disease, stage 3b: Secondary | ICD-10-CM | POA: Diagnosis not present

## 2023-01-20 ENCOUNTER — Other Ambulatory Visit: Payer: Self-pay

## 2023-01-20 ENCOUNTER — Emergency Department (HOSPITAL_COMMUNITY)
Admission: EM | Admit: 2023-01-20 | Discharge: 2023-01-20 | Disposition: A | Discharge status: Home / Self Care | Payer: Medicare PPO | Attending: Emergency Medicine | Admitting: Emergency Medicine

## 2023-01-20 DIAGNOSIS — Z79899 Other long term (current) drug therapy: Secondary | ICD-10-CM | POA: Insufficient documentation

## 2023-01-20 DIAGNOSIS — I10 Essential (primary) hypertension: Secondary | ICD-10-CM | POA: Insufficient documentation

## 2023-01-20 DIAGNOSIS — R001 Bradycardia, unspecified: Secondary | ICD-10-CM | POA: Diagnosis not present

## 2023-01-20 DIAGNOSIS — E1165 Type 2 diabetes mellitus with hyperglycemia: Secondary | ICD-10-CM | POA: Diagnosis not present

## 2023-01-20 DIAGNOSIS — Z7984 Long term (current) use of oral hypoglycemic drugs: Secondary | ICD-10-CM | POA: Insufficient documentation

## 2023-01-20 DIAGNOSIS — R110 Nausea: Secondary | ICD-10-CM | POA: Diagnosis not present

## 2023-01-20 DIAGNOSIS — Z794 Long term (current) use of insulin: Secondary | ICD-10-CM | POA: Insufficient documentation

## 2023-01-20 DIAGNOSIS — R42 Dizziness and giddiness: Secondary | ICD-10-CM | POA: Diagnosis not present

## 2023-01-20 LAB — CBC
HCT: 44.7 % (ref 36.0–46.0)
Hemoglobin: 14.6 g/dL (ref 12.0–15.0)
MCH: 28.6 pg (ref 26.0–34.0)
MCHC: 32.7 g/dL (ref 30.0–36.0)
MCV: 87.5 fL (ref 80.0–100.0)
Platelets: 196 10*3/uL (ref 150–400)
RBC: 5.11 MIL/uL (ref 3.87–5.11)
RDW: 13.2 % (ref 11.5–15.5)
WBC: 6.5 10*3/uL (ref 4.0–10.5)
nRBC: 0 % (ref 0.0–0.2)

## 2023-01-20 LAB — BASIC METABOLIC PANEL
Anion gap: 15 (ref 5–15)
BUN: 35 mg/dL — ABNORMAL HIGH (ref 8–23)
CO2: 20 mmol/L — ABNORMAL LOW (ref 22–32)
Calcium: 9.4 mg/dL (ref 8.9–10.3)
Chloride: 101 mmol/L (ref 98–111)
Creatinine, Ser: 1.37 mg/dL — ABNORMAL HIGH (ref 0.44–1.00)
GFR, Estimated: 39 mL/min — ABNORMAL LOW (ref >60)
Glucose, Bld: 156 mg/dL — ABNORMAL HIGH (ref 70–99)
Potassium: 4 mmol/L (ref 3.5–5.1)
Sodium: 136 mmol/L (ref 135–145)

## 2023-01-20 MED ORDER — ONDANSETRON HCL 4 MG PO TABS: 4.0000 mg | ORAL_TABLET | Freq: Four times a day (QID) | ORAL | 0 refills | Status: DC | PRN

## 2023-01-20 MED ORDER — MECLIZINE HCL 25 MG PO TABS
25.0000 mg | ORAL_TABLET | ORAL | Status: AC
  Administered 2023-01-20: 25 mg via ORAL
  Filled 2023-01-20: qty 1

## 2023-01-20 MED ORDER — SODIUM CHLORIDE 0.9 % IV BOLUS
500.0000 mL | Freq: Once | INTRAVENOUS | Status: AC
  Administered 2023-01-20: 500 mL via INTRAVENOUS

## 2023-01-20 MED ORDER — MECLIZINE HCL 12.5 MG PO TABS: 12.5000 mg | ORAL_TABLET | Freq: Three times a day (TID) | ORAL | 0 refills | Status: DC | PRN

## 2023-01-20 NOTE — ED Provider Notes (Signed)
[facility] Provider Note   CSN: [id number] Arrival date & time: 01/20/23  1731     History  Chief Complaint  Patient presents with   Nausea   Dizziness    Holly [name] is a 82 y.o. female with medical history of DJD, diabetes, hypertension, hyperlipidemia, OSA.  Patient presents to ED for evaluation of dizziness.  Patient reports that this morning around 8 AM she woke up with dizziness and then became nauseated as a result.  The patient reports that her dizziness has persisted throughout the day.  The patient denies a history of dizziness, denies history of vertigo.  Patient reports dizziness is worse with movement of head, standing.  Patient reports dizziness decreased when she closes her eyes and lies still.  Patient denies falls as a result of her dizziness.  Patient reports that she has become very nauseous throughout the day as a result of her dizziness and has vomited 2 times.  Patient denies any abdominal pain, diarrhea, fevers, chest pain, shortness of breath, one-sided weakness or numbness, syncope.  Patient denies blood thinners.   Dizziness Associated symptoms: nausea and vomiting        Home Medications Prior to Admission medications   Medication Sig Start Date End Date Taking? Authorizing Provider  meclizine (ANTIVERT) 12.5 MG tablet Take 1 tablet (12.5 mg total) by mouth 3 (three) times daily as needed for dizziness. 01/20/23  Yes Azucena Cecil, PA-C  ondansetron (ZOFRAN) 4 MG tablet Take 1 tablet (4 mg total) by mouth every 6 (six) hours as needed for nausea or vomiting. 01/20/23  Yes Azucena Cecil, PA-C  Accu-Chek FastClix Lancets MISC USE THREE TIMES DAILY 11/21/19   [name], MD  ACCU-CHEK GUIDE test strip USE THREE TIMES DAILY 11/21/19   [name], MD  acetaminophen (TYLENOL) 500 MG tablet Take 1,000 mg by mouth every 6 (six) hours as needed for mild pain.    [provider]  atorvastatin  (LIPITOR) 40 MG tablet TAKE 1 TABLET EVERY DAY 09/28/22   [name], MD  Blood Glucose Monitoring Suppl (ACCU-CHEK AVIVA PLUS) w/Device KIT Use to check blood sugars daily Dx E11.9 04/01/19   [name], MD  carvedilol (COREG) 12.5 MG tablet TAKE 1 TABLET TWICE DAILY WITH MEALS 12/02/22   [name], MD  cetirizine (ZYRTEC) 10 MG tablet Take 10 mg by mouth daily.    [provider]  Cholecalciferol 2000 units TABS Take 1 tablet (2,000 Units total) by mouth daily. 04/04/18   [name], MD  Continuous Blood Gluc Receiver (DEXCOM G6 RECEIVER) DEVI 1 Act by Does not apply route daily. 04/02/21   [name], MD  Continuous Blood Gluc Sensor (DEXCOM G6 SENSOR) MISC 1 Act by Does not apply route daily. 04/02/21   [name], MD  Continuous Blood Gluc Transmit (DEXCOM G6 TRANSMITTER) MISC 1 Act by Does not apply route daily. 04/02/21   [name], MD  Cyanocobalamin (VITAMIN B-12 CR PO) Take 1 tablet by mouth daily.    [provider]  empagliflozin (JARDIANCE) 25 MG TABS tablet Take 1 tablet (25 mg total) by mouth daily before breakfast. 11/10/22   [name], MD  Finerenone (KERENDIA) 20 MG TABS Take 1 tablet by mouth daily. [employer] Patient Assistance Medication 11/17/22 11/14/23  [name], MD  Glucagon (GVOKE HYPOPEN 2-PACK) 1 MG/0.2ML SOAJ Inject 1 Act into the skin daily as  needed. 04/02/21   [name], MD  indapamide (LOZOL) 1.25 MG tablet TAKE 1 TABLET EVERY DAY 06/23/22   [name], MD  insulin glargine (LANTUS SOLOSTAR) 100 UNIT/ML Solostar Pen Inject 60 Units into the skin daily. 12/02/22   [name], MD  Insulin Pen Needle (DROPLET PEN NEEDLES) 32G X 6 MM MISC USE DAILY 10/03/22   [name], MD  metFORMIN (GLUCOPHAGE-XR) 750 MG 24 hr tablet Take 1 tablet (750 mg total) by mouth daily with breakfast. 12/02/22   [name], MD  olmesartan (BENICAR) 20 MG tablet TAKE 1 TABLET EVERY DAY 02/28/22   [name], MD       Allergies    Nifedipine, Amlodipine, Oxycodone, and Penicillins    Review of Systems   Review of Systems  Gastrointestinal:  Positive for nausea and vomiting.  Neurological:  Positive for dizziness.  All other systems reviewed and are negative.   Physical Exam Updated Vital Signs BP (!) 145/82 (BP Location: Right Arm)   Pulse 70   Temp 97.8 F (36.6 C) (Oral)   Resp 17   SpO2 99%  Physical Exam Vitals and nursing note reviewed.  Constitutional:      General: She is not in acute distress.    Appearance: Normal appearance. She is not ill-appearing, toxic-appearing or diaphoretic.  HENT:     Head: Normocephalic and atraumatic.     Nose: Nose normal.     Mouth/Throat:     Mouth: Mucous membranes are moist.     Pharynx: Oropharynx is clear.  Eyes:     Extraocular Movements: Extraocular movements intact.     Conjunctiva/sclera: Conjunctivae normal.     Pupils: Pupils are equal, round, and reactive to light.  Cardiovascular:     Rate and Rhythm: Normal rate and regular rhythm.  Pulmonary:     Effort: Pulmonary effort is normal.     Breath sounds: Normal breath sounds.  Abdominal:     General: Abdomen is flat.     Palpations: Abdomen is soft.     Tenderness: There is no abdominal tenderness.  Musculoskeletal:     Cervical back: Normal range of motion and neck supple.  Skin:    General: Skin is warm and dry.  Neurological:     General: No focal deficit present.     Mental Status: She is alert and oriented to person, place, and time.     GCS: GCS eye subscore is 4. GCS verbal subscore is 5. GCS motor subscore is 6.     Cranial Nerves: Cranial nerves 2-12 are intact. No cranial nerve deficit.     Sensory: Sensation is intact. No sensory deficit.     Motor: Motor function is intact. No weakness.     Coordination: Coordination is intact. Heel to [facility] Test normal.     Comments: Cranial nerves II through XII intact.  No plantar drift, no slurred speech, no facial droop.  5  out of 5 strength bilateral lower and upper extremities.  Intact finger-nose, heel-to-shin.     ED Results / Procedures / Treatments   Labs (all labs ordered are listed, but only abnormal results are displayed) Labs Reviewed  BASIC METABOLIC PANEL - Abnormal; Notable for the following components:      Result Value   CO2 20 (*)    Glucose, Bld 156 (*)    BUN 35 (*)    Creatinine, Ser 1.37 (*)    GFR, Estimated 39 (*)  All other components within normal limits  CBC    EKG EKG Interpretation  Date/Time:  Friday January 20 2023 18:00:53 EST Ventricular Rate:  71 PR Interval:  200 QRS Duration: 100 QT Interval:  426 QTC Calculation: 462 R Axis:   -1 Text Interpretation: Normal sinus rhythm Normal ECG No previous ECGs available Confirmed by Blanchie Dessert [id number]) on 01/20/2023 6:08:34 PM  Radiology No results found.  Procedures Procedures   Medications Ordered in ED Medications  sodium chloride 0.9 % bolus 500 mL (0 mLs Intravenous Stopped 01/20/23 1855)  meclizine (ANTIVERT) tablet 25 mg (25 mg Oral Given 01/20/23 1805)    ED Course/ Medical Decision Making/ A&P    Medical Decision Making Amount and/or Complexity of Data Reviewed Labs: ordered.   82 year old female presents to the ED for evaluation.  Please see HPI for further details.  On examination the patient is afebrile and nontachycardic.  The patient lung sounds are clear bilaterally, she is not hypoxic on room air.  The abdomen is soft and compressible throughout.  Neurological examination shows no focal neurodeficits.  The patient cranial nerves II through XII are intact, there is no facial droop, there is no slurred speech.  The patient has equal grip strength upper and lower extremities bilaterally.  The patient can perform rapid alternating movements, intact heel-to-shin, finger-nose.  Patient nontoxic in appearance.  Patient labs to be collected will include CBC, BMP, EKG.  Will trial meclizine 25 mg, 500  mL of fluid.  Patient CBC unremarkable without leukocytosis or anemia.  The patient BMP shows elevated glucose to 156, elevated creatinine 1.37 which is consistent with the patient baseline.  Patient EKG is normal sinus rhythm.  On reassessment, patient reports feeling much less dizzy after receiving meclizine and fluid.  The patient was ambulated and showed no gait instability.  Patient reports feeling better at this time.  The patient will be discharged home with meclizine 12 and half milligrams and advised to follow-up with her PCP.  The patient was given return precautions and she and her son at the bedside reported understanding of my return precautions.  The patient had a chance to ask all questions to her satisfaction.  The patient is stable at this time for discharge home.   Final Clinical Impression(s) / ED Diagnoses Final diagnoses:  Dizziness    Rx / DC Orders ED Discharge Orders          Ordered    meclizine (ANTIVERT) 12.5 MG tablet  3 times daily PRN        01/20/23 1904    ondansetron (ZOFRAN) 4 MG tablet  Every 6 hours PRN        01/20/23 1904              Lawana Chambers 01/20/23 Hadassah Pais, MD 01/21/23 1858

## 2023-01-20 NOTE — Discharge Instructions (Addendum)
Please return to the ED with any new or worsening signs or symptoms Please follow-up with your doctor next week for reevaluation Please read attached guides concerning dizziness, benign positional vertigo Please take medication prescribed, meclizine, 3 times a day as needed for dizziness Please take Zofran every 6 hours as needed for nausea and vomiting

## 2023-01-20 NOTE — ED Triage Notes (Signed)
Pt BIB EMS from home. Per EMS, pt c/o nausea and dizziness that started today @ 0800. Pt had one episode of vomiting. '4mg'$  of zofran given en route. Pt negative for stroke screen with EMS. A/Ox4.

## 2023-01-20 NOTE — ED Notes (Signed)
Pt ambulated to bathroom with assistance at this time. Pt states dizziness has improved.

## 2023-01-23 ENCOUNTER — Telehealth: Payer: Self-pay

## 2023-01-23 NOTE — Telephone Encounter (Signed)
Called patient to schedule Medicare Annual Wellness Visit (AWV). Unable to reach patient.  Last date of AWV: 01/28/22  Please schedule an AWV-S appointment at any time with Nurse Health Advisor.    Norton Blizzard, [place] (AAMA)  [facility] [phone]

## 2023-01-24 ENCOUNTER — Encounter: Payer: Self-pay | Admitting: *Deleted

## 2023-01-24 ENCOUNTER — Telehealth: Payer: Self-pay | Admitting: *Deleted

## 2023-01-24 DIAGNOSIS — E118 Type 2 diabetes mellitus with unspecified complications: Secondary | ICD-10-CM

## 2023-01-24 NOTE — Transitions of Care (Post Inpatient/ED Visit) (Signed)
   01/24/2023  Name: Holly [name] MRN: [id number] DOB: December 17, 1940  Today's TOC FU Call Status: Today's TOC FU Call Status:: Successful TOC FU Call Competed TOC FU Call Complete Date: 01/24/23  Transition Care Management Follow-up Telephone Call Date of Discharge: 01/20/23 Discharge [facility]: [facility]) Type of Discharge: Emergency Department Reason for ED Visit: Other: (dizziness) How have you been since you were released from the hospital?: Better ("I am doing somewhat better-- still dizzy but not quite as bad as it was at first; I am using my cane; I made the appointment with dr. Ronnald Ramp yesterday and i'll be going to his office on thursday as planned.") Any questions or concerns?: No  Items Reviewed: Did you receive and understand the discharge instructions provided?: Yes (thoroughly reviewed with patient who verbalizes excellent understanding of same) Medications obtained and verified?: Yes (Medications Reviewed) (confirmed patient obtained/ is taking all newly Rx'd medications as instructed; self-manages medications and denies questions/ concerns around medications today) Any new allergies since your discharge?: No Dietary orders reviewed?: NA Do you have support at home?: Yes People in Home: sibling(s) Name of Support/Comfort Primary Source: patient reports she is independent in self-care activities; resides with her brother who assists, along with her local adult son, in care needs as indicated  Home Care and Equipment/Supplies: [place] Ordered?: NA Any new equipment or medical supplies ordered?: NA  Functional Questionnaire: Do you need assistance with bathing/showering or dressing?: No Do you need assistance with meal preparation?: No Do you need assistance with eating?: No Do you have difficulty maintaining continence: No Do you need assistance with getting out of bed/getting out of a chair/moving?: No Do you have difficulty managing or taking your  medications?: No  Folllow up appointments reviewed: PCP Follow-up appointment confirmed?: Yes Date of PCP follow-up appointment?: 01/26/23 Follow-up Provider: Dr. Ronnald Ramp, PCP [facility] Follow-up appointment confirmed?: NA Do you need transportation to your follow-up appointment?: No Do you understand care options if your condition(s) worsen?: Yes-patient verbalized understanding  SDOH Interventions Today    Flowsheet Row Most Recent Value  SDOH Interventions   Food Insecurity Interventions Intervention Not Indicated  Transportation Interventions Intervention Not Indicated, Other (Comment)  [son provides transportation currently,  patient reports she would like to apply for SCAT: [employer] Care Guide referral placed to provide resources/ assist with SCAT application]      TOC Interventions Today    Flowsheet Row Most Recent Value  TOC Interventions   TOC Interventions Discussed/Reviewed TOC Interventions Discussed      Interventions Today    Flowsheet Row Most Recent Value  Chronic Disease   Chronic disease during today's visit Other  [dizziness/ ED visit]  General Interventions   General Interventions Discussed/Reviewed General Interventions Discussed, Doctor Visits, Emergency planning/management officer  [place] referral placed for transportation resources]  Doctor Visits Discussed/Reviewed Doctor Visits Discussed, PCP  PCP/Specialist Visits Compliance with follow-up visit  Education Interventions   Education Provided Provided Education  Provided Verbal Education On Other  [Fall prevention precautions]  Nutrition Interventions   Nutrition Discussed/Reviewed Nutrition Discussed  Pharmacy Interventions   Pharmacy Dicussed/Reviewed Pharmacy Topics Discussed      Oneta Rack, RN, BSN, CCRN Alumnus RN CM Care Coordination/ Transition of [place] Management [phone]: direct office

## 2023-01-25 ENCOUNTER — Other Ambulatory Visit: Payer: Self-pay | Admitting: Internal Medicine

## 2023-01-25 DIAGNOSIS — E118 Type 2 diabetes mellitus with unspecified complications: Secondary | ICD-10-CM

## 2023-01-25 DIAGNOSIS — I10 Essential (primary) hypertension: Secondary | ICD-10-CM

## 2023-01-26 ENCOUNTER — Ambulatory Visit: Payer: Medicare PPO | Admitting: Internal Medicine

## 2023-01-26 ENCOUNTER — Encounter: Payer: Self-pay | Admitting: Internal Medicine

## 2023-01-26 VITALS — BP 136/78 | HR 60 | Temp 97.9°F | Resp 16 | Ht 69.0 in | Wt 304.0 lb

## 2023-01-26 DIAGNOSIS — R42 Dizziness and giddiness: Secondary | ICD-10-CM | POA: Diagnosis not present

## 2023-01-26 DIAGNOSIS — I10 Essential (primary) hypertension: Secondary | ICD-10-CM

## 2023-01-26 DIAGNOSIS — N1831 Chronic kidney disease, stage 3a: Secondary | ICD-10-CM

## 2023-01-26 DIAGNOSIS — I110 Hypertensive heart disease with heart failure: Secondary | ICD-10-CM

## 2023-01-26 DIAGNOSIS — E118 Type 2 diabetes mellitus with unspecified complications: Secondary | ICD-10-CM

## 2023-01-26 MED ORDER — MECLIZINE HCL 12.5 MG PO TABS: 12.5000 mg | ORAL_TABLET | Freq: Three times a day (TID) | ORAL | 1 refills | Status: DC | PRN

## 2023-01-26 NOTE — Progress Notes (Signed)
Subjective:  Patient ID: Holly [name], female    DOB: [birth year]  Age: 82 y.o. MRN: [id number]  CC: Hypertension and Diabetes   HPI Holly [name] presents for f/up ---  She was seen in the ED 5 days ago with nausea, dizziness, and vertigo.  She has gotten some symptom relief with meclizine and wants to continue taking it.  Outpatient Medications Prior to Visit  Medication Sig Dispense Refill   Accu-Chek FastClix Lancets MISC USE THREE TIMES DAILY 306 each 1   ACCU-CHEK GUIDE test strip USE THREE TIMES DAILY 300 strip 1   acetaminophen (TYLENOL) 500 MG tablet Take 1,000 mg by mouth every 6 (six) hours as needed for mild pain.     atorvastatin (LIPITOR) 40 MG tablet TAKE 1 TABLET EVERY DAY 90 tablet 10   Blood Glucose Monitoring Suppl (ACCU-CHEK AVIVA PLUS) w/Device KIT Use to check blood sugars daily Dx E11.9 1 kit 0   carvedilol (COREG) 12.5 MG tablet TAKE 1 TABLET TWICE DAILY WITH MEALS 180 tablet 0   cetirizine (ZYRTEC) 10 MG tablet Take 10 mg by mouth daily.     Cholecalciferol 2000 units TABS Take 1 tablet (2,000 Units total) by mouth daily. 90 tablet 1   Continuous Blood Gluc Receiver (DEXCOM G6 RECEIVER) DEVI 1 Act by Does not apply route daily. 1 each 5   Continuous Blood Gluc Sensor (DEXCOM G6 SENSOR) MISC 1 Act by Does not apply route daily. 1 each 5   Continuous Blood Gluc Transmit (DEXCOM G6 TRANSMITTER) MISC 1 Act by Does not apply route daily. 1 each 5   Cyanocobalamin (VITAMIN B-12 CR PO) Take 1 tablet by mouth daily.     empagliflozin (JARDIANCE) 25 MG TABS tablet Take 1 tablet (25 mg total) by mouth daily before breakfast. 90 tablet 0   Finerenone (KERENDIA) 20 MG TABS Take 1 tablet by mouth daily. [employer] Patient Assistance Medication 90 tablet 0   Glucagon (GVOKE HYPOPEN 2-PACK) 1 MG/0.2ML SOAJ Inject 1 Act into the skin daily as needed. 2 mL 5   indapamide (LOZOL) 1.25 MG tablet TAKE 1 TABLET EVERY DAY 90 tablet 1   insulin glargine (LANTUS SOLOSTAR) 100 UNIT/ML  Solostar Pen Inject 60 Units into the skin daily. 54 mL 1   Insulin Pen Needle (DROPLET PEN NEEDLES) 32G X 6 MM MISC USE DAILY 100 each 5   metFORMIN (GLUCOPHAGE-XR) 750 MG 24 hr tablet Take 1 tablet (750 mg total) by mouth daily with breakfast. 90 tablet 1   olmesartan (BENICAR) 20 MG tablet TAKE 1 TABLET EVERY DAY 90 tablet 0   ondansetron (ZOFRAN) 4 MG tablet Take 1 tablet (4 mg total) by mouth every 6 (six) hours as needed for nausea or vomiting. 12 tablet 0   meclizine (ANTIVERT) 12.5 MG tablet Take 1 tablet (12.5 mg total) by mouth 3 (three) times daily as needed for dizziness. 30 tablet 0   No facility-administered medications prior to visit.    ROS Review of Systems  Constitutional:  Negative for diaphoresis and fatigue.  HENT: Negative.    Eyes: Negative.   Respiratory:  Negative for cough, chest tightness, shortness of breath and wheezing.   Cardiovascular:  Negative for chest pain, palpitations and leg swelling.  Gastrointestinal:  Negative for abdominal pain, constipation, diarrhea, nausea and vomiting.  Endocrine: Negative.   Genitourinary: Negative.  Negative for difficulty urinating.  Musculoskeletal:  Positive for arthralgias. Negative for back pain.  Skin: Negative.   Neurological:  Positive for dizziness.  Negative for weakness.  Hematological:  Negative for adenopathy. Does not bruise/bleed easily.  Psychiatric/Behavioral: Negative.      Objective:  BP 136/78 (BP Location: Left Arm, Patient Position: Sitting, Cuff Size: Normal)   Pulse 60   Temp 97.9 F (36.6 C) (Oral)   Resp 16   Ht 5\' 9"  (1.753 m)   Wt (!) 304 lb (137.9 kg)   SpO2 97%   BMI 44.89 kg/m   BP Readings from Last 3 Encounters:  01/26/23 136/78  01/20/23 (!) 145/82  10/05/22 (!) 152/88    Wt Readings from Last 3 Encounters:  01/26/23 (!) 304 lb (137.9 kg)  10/05/22 (!) 309 lb (140.2 kg)  02/24/22 (!) 303 lb (137.4 kg)    Physical Exam Vitals reviewed.  Constitutional:       Appearance: Normal appearance.  HENT:     Nose: Nose normal.     Mouth/Throat:     Mouth: Mucous membranes are moist.  Eyes:     General: No scleral icterus.    Conjunctiva/sclera: Conjunctivae normal.  Cardiovascular:     Rate and Rhythm: Normal rate and regular rhythm.     Heart sounds: No murmur heard. Pulmonary:     Effort: Pulmonary effort is normal.     Breath sounds: No stridor. No wheezing, rhonchi or rales.  Abdominal:     General: Abdomen is flat.     Palpations: There is no mass.     Tenderness: There is no abdominal tenderness. There is no guarding.     Hernia: No hernia is present.  Musculoskeletal:        General: Normal range of motion.     Cervical back: Neck supple.     Right lower leg: No edema.     Left lower leg: No edema.  Lymphadenopathy:     Cervical: No cervical adenopathy.  Skin:    General: Skin is warm and dry.  Neurological:     General: No focal deficit present.     Mental Status: She is alert. Mental status is at baseline.  Psychiatric:        Mood and Affect: Mood normal.        Behavior: Behavior normal.     Lab Results  Component Value Date   WBC 6.5 01/20/2023   HGB 14.6 01/20/2023   HCT 44.7 01/20/2023   PLT 196 01/20/2023   GLUCOSE 156 (H) 01/20/2023   CHOL 176 12/01/2022   TRIG 152.0 (H) 12/01/2022   HDL 61.20 12/01/2022   LDLDIRECT 134.8 08/27/2010   LDLCALC 84 12/01/2022   ALT 21 12/01/2022   AST 21 12/01/2022   NA 136 01/20/2023   K 4.0 01/20/2023   CL 101 01/20/2023   CREATININE 1.37 (H) 01/20/2023   BUN 35 (H) 01/20/2023   CO2 20 (L) 01/20/2023   TSH 2.46 12/01/2022   HGBA1C 8.7 (H) 12/01/2022   MICROALBUR 445.8 03/01/2021    No results found.  Assessment & Plan:  Vertigo -     Meclizine HCl; Take 1 tablet (12.5 mg total) by mouth 3 (three) times daily as needed for dizziness.  Dispense: 90 tablet; Refill: 1  Type II diabetes mellitus with manifestations ([place])- Her blood sugar has been adequately  well-controlled.  Stage 3a chronic kidney disease ([place])- Her renal function is stable.  Malignant hypertension- Her blood pressure is well-controlled.  LVH (left ventricular hypertrophy) due to hypertensive disease, with heart failure ([place])- She has a normal volume status.  Follow-up: Return in about 3 months (around 04/28/2023).  Scarlette Calico, MD

## 2023-01-26 NOTE — Patient Instructions (Signed)

## 2023-01-27 ENCOUNTER — Telehealth: Payer: Self-pay | Admitting: *Deleted

## 2023-01-27 NOTE — Telephone Encounter (Signed)
   Telephone encounter was:  Unsuccessful.  01/27/2023 Name: CHENEA KIPPER MRN: [id number] DOB: June 12, 1941  Unsuccessful outbound call made today to assist with:  Transportation Needs   Outreach Attempt:  1st Attempt  A HIPAA compliant voice message was left requesting a return call.  Instructed patient to call back at [phone].  [facility] [phone] [address] , [place] [address] Email : [email or web address]

## 2023-01-31 ENCOUNTER — Telehealth: Payer: Self-pay | Admitting: *Deleted

## 2023-01-31 NOTE — Telephone Encounter (Signed)
   Telephone encounter was:  Unsuccessful.  01/31/2023 Name: VERNIECE DEJOIE MRN: [id number] DOB: [birth year]  Unsuccessful outbound call made today to assist with:  Transportation Needs   Outreach Attempt:  2nd Attempt  A HIPAA compliant voice message was left requesting a return call.  Instructed patient to call back at [phone].  [facility] [phone] [address] , [place] [address] Email : [email or web address]

## 2023-02-01 ENCOUNTER — Telehealth: Payer: Self-pay | Admitting: *Deleted

## 2023-02-01 NOTE — Telephone Encounter (Signed)
   Telephone encounter was:  Unsuccessful.  02/01/2023 Name: CHRISIE BOYER MRN: [id number] DOB: Oct 12, 1941  Unsuccessful outbound call made today to assist with:  Transportation Needs   Outreach Attempt:  3rd Attempt.  Referral closed unable to contact patient. Mailed [employer] application  A HIPAA compliant voice message was left requesting a return call.  Instructed patient to call back at [phone].  [facility] [phone] [address] , [place] [address] Email : [email or web address]

## 2023-02-02 ENCOUNTER — Telehealth: Payer: Self-pay | Admitting: *Deleted

## 2023-02-02 ENCOUNTER — Encounter: Payer: Self-pay | Admitting: *Deleted

## 2023-02-02 NOTE — Patient Outreach (Signed)
  Care Coordination   02/02/2023 Name: TYELER DINES MRN: [id number] DOB: [birth year]   Care Coordination Outreach Attempts:  An unsuccessful telephone outreach was attempted today to offer the patient information about available care coordination services as a benefit of their health plan.   Follow Up Plan:  Additional outreach attempts will be made to offer the patient care coordination information and services.   Encounter Outcome:  No Answer ; left HIPAA compliant voice message requesting call back   Care Coordination Interventions:  No, not indicated unsuccessful outreach attempt    Oneta Rack, RN, BSN, CCRN Alumnus RN CM Care Coordination/ Transition of [place] Management [phone]: direct office

## 2023-02-06 ENCOUNTER — Telehealth: Payer: Self-pay | Admitting: *Deleted

## 2023-02-06 ENCOUNTER — Other Ambulatory Visit: Payer: Self-pay | Admitting: Internal Medicine

## 2023-02-06 DIAGNOSIS — I10 Essential (primary) hypertension: Secondary | ICD-10-CM

## 2023-02-06 DIAGNOSIS — R600 Localized edema: Secondary | ICD-10-CM

## 2023-02-06 NOTE — Telephone Encounter (Signed)
   Telephone encounter was:  Successful.  02/06/2023 Name: TASHICA EYERLY MRN: [id number] DOB: 05/12/1941  AMELIYA FRATE is a 82 y.o. year old female who is a primary care patient of [name], MD . The community resource team was consulted for assistance with Transportation Needs   Care guide performed the following interventions: Patient provided with information about care guide support team and interviewed to confirm resource needs. Called [facility] Rossetti to verify member transportation [id number] no transportation attached to her plan provided with [employer] transportation and also mailed [employer] application  Follow Up Plan:  No further follow up planned at this time. The patient has been provided with needed resources. [facility] [phone] [address] , [place] [address] Email : [email or web address]

## 2023-02-10 ENCOUNTER — Ambulatory Visit: Payer: Self-pay | Admitting: *Deleted

## 2023-02-10 ENCOUNTER — Encounter: Payer: Self-pay | Admitting: *Deleted

## 2023-02-10 NOTE — Patient Outreach (Signed)
Care Coordination Documentation only:  Patient returned my call from 02/02/23- left voice message confirming that she had been provided with transportation resources as requested; in her message, she stated she did not need a call-back from me and confirmed she has my direct contact information should needs arise in the future; she verbalized appreciation for the assistance provided to her  Confirmed from review of EHR that [facility] successfully outreached patient on 02/06/23 to ensure no ongoing community resource needs  Oneta Rack, RN, BSN, CCRN Alumnus RN CM Care Coordination/ Transition of [place] Management [phone]: direct office

## 2023-02-16 ENCOUNTER — Other Ambulatory Visit: Payer: Self-pay | Admitting: Internal Medicine

## 2023-02-20 ENCOUNTER — Telehealth: Payer: Self-pay | Admitting: Internal Medicine

## 2023-02-20 NOTE — Telephone Encounter (Signed)
Contacted Clement Husbands to schedule their annual wellness visit. Appointment made for 02/22/2023.  [facility] Care Guide [facility] Community AWV TEAM Direct Dial: [phone]

## 2023-02-22 ENCOUNTER — Ambulatory Visit (INDEPENDENT_AMBULATORY_CARE_PROVIDER_SITE_OTHER): Payer: Medicare PPO

## 2023-02-22 ENCOUNTER — Telehealth: Payer: Self-pay

## 2023-02-22 VITALS — Ht 69.5 in | Wt 304.0 lb

## 2023-02-22 DIAGNOSIS — Z0000 Encounter for general adult medical examination without abnormal findings: Secondary | ICD-10-CM | POA: Diagnosis not present

## 2023-02-22 DIAGNOSIS — Z9181 History of falling: Secondary | ICD-10-CM | POA: Diagnosis not present

## 2023-02-22 DIAGNOSIS — R600 Localized edema: Secondary | ICD-10-CM

## 2023-02-22 DIAGNOSIS — E1121 Type 2 diabetes mellitus with diabetic nephropathy: Secondary | ICD-10-CM

## 2023-02-22 NOTE — Progress Notes (Signed)
Subjective:   Holly [name] is a 82 y.o. female who presents for Medicare Annual (Subsequent) preventive examination.  I connected with  Clement Husbands on 02/22/23 by a audio enabled telemedicine application and verified that I am speaking with the correct person using two identifiers.  Patient Location: Home  Provider Location: Office/Clinic  I discussed the limitations of evaluation and management by telemedicine. The patient expressed understanding and agreed to proceed.   Review of Systems     Cardiac Risk Factors include: advanced age (>69men, >39 women);diabetes mellitus;dyslipidemia;hypertension;obesity (BMI >30kg/m2);sedentary lifestyle;Other (see comment), Risk factor comments: OSA not using CPAP, atherosclerosis     Objective:    Today's Vitals   02/22/23 0852  Weight: (!) 304 lb (137.9 kg)  Height: 5' 9.5" (1.765 m)  PainSc: 5    Body mass index is 44.25 kg/m.     02/22/2023    9:09 AM 01/28/2022    4:05 PM 03/19/2021   12:46 PM 05/22/2020   11:48 AM 05/28/2019   11:39 AM 05/03/2019    2:25 PM 08/22/2018    1:30 PM  Advanced Directives  Does Patient Have a Medical Advance Directive? No Yes No No No;Yes Yes Yes;No  Type of Advance Directive  Living will;Healthcare Power of [employer] Power of [place];Living will  Healthcare Power of [place];Living will  Does patient want to make changes to medical advance directive?  No - Patient declined   No - Patient declined No - Patient declined   Copy of Healthcare Power of Attorney in Chart?  No - copy requested   No - copy requested    Would patient like information on creating a medical advance directive? No - Patient declined  No - Patient declined No - Patient declined       Current Medications (verified) Outpatient Encounter Medications as of 02/22/2023  Medication Sig   Accu-Chek FastClix Lancets MISC USE THREE TIMES DAILY   ACCU-CHEK GUIDE test strip USE THREE TIMES DAILY   acetaminophen (TYLENOL)  500 MG tablet Take 1,000 mg by mouth every 6 (six) hours as needed for mild pain.   atorvastatin (LIPITOR) 40 MG tablet TAKE 1 TABLET EVERY DAY   Blood Glucose Monitoring Suppl (ACCU-CHEK AVIVA PLUS) w/Device KIT Use to check blood sugars daily Dx E11.9   carvedilol (COREG) 12.5 MG tablet TAKE 1 TABLET TWICE DAILY WITH MEALS   cetirizine (ZYRTEC) 10 MG tablet Take 10 mg by mouth daily.   Cholecalciferol 2000 units TABS Take 1 tablet (2,000 Units total) by mouth daily.   Continuous Blood Gluc Receiver (DEXCOM G6 RECEIVER) DEVI 1 Act by Does not apply route daily.   Continuous Blood Gluc Sensor (DEXCOM G6 SENSOR) MISC 1 Act by Does not apply route daily.   Continuous Blood Gluc Transmit (DEXCOM G6 TRANSMITTER) MISC 1 Act by Does not apply route daily.   Cyanocobalamin (VITAMIN B-12 CR PO) Take 1 tablet by mouth daily.   empagliflozin (JARDIANCE) 25 MG TABS tablet Take 1 tablet (25 mg total) by mouth daily before breakfast.   Finerenone (KERENDIA) 20 MG TABS Take 1 tablet by mouth daily. [employer] Patient Assistance Medication   Glucagon (GVOKE HYPOPEN 2-PACK) 1 MG/0.2ML SOAJ Inject 1 Act into the skin daily as needed.   indapamide (LOZOL) 1.25 MG tablet TAKE 1 TABLET EVERY DAY   insulin glargine (LANTUS SOLOSTAR) 100 UNIT/ML Solostar Pen Inject 60 Units into the skin daily.   Insulin Pen Needle (DROPLET PEN NEEDLES) 32G X 6 MM  MISC USE DAILY   metFORMIN (GLUCOPHAGE-XR) 750 MG 24 hr tablet Take 1 tablet (750 mg total) by mouth daily with breakfast.   olmesartan (BENICAR) 20 MG tablet TAKE 1 TABLET EVERY DAY   meclizine (ANTIVERT) 12.5 MG tablet Take 1 tablet (12.5 mg total) by mouth 3 (three) times daily as needed for dizziness. (Patient not taking: Reported on 02/22/2023)   ondansetron (ZOFRAN) 4 MG tablet Take 1 tablet (4 mg total) by mouth every 6 (six) hours as needed for nausea or vomiting. (Patient not taking: Reported on 02/22/2023)   No facility-administered encounter medications on file as of  02/22/2023.    Allergies (verified) Nifedipine, Amlodipine, Oxycodone, and Penicillins   History: Past Medical History:  Diagnosis Date   Closed dislocation of shoulder    unspecified site   DJD (degenerative joint disease) of knee    Right knee   DM (diabetes mellitus)    Hyperlipidemia    Hypertension    Obesity, morbid    OSA (obstructive sleep apnea) 02/03/2017   Past Surgical History:  Procedure Laterality Date   CHOLECYSTECTOMY  1989   KNEE ARTHROSCOPY  2010   LEFT/ Dr Lajoyce Corners   TUBAL LIGATION  1979   Family History  Problem Relation Age of Onset   Heart disease Mother    Kidney disease Father    Aortic aneurysm Brother        Survived rupture   Social History   Socioeconomic History   Marital status: Divorced    Spouse name: Not on file   Number of children: 2   Years of education: Not on file   Highest education level: Not on file  Occupational History   Occupation: [employer]    Comment: Theatre stage manager    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[employer]    Comment: RETIRED  Tobacco Use   Smoking status: Former    Packs/day: 0.50    Years: 25.00    Additional pack years: 0.00    Total pack years: 12.50    Types: Cigarettes   Smokeless tobacco: Never  Vaping Use   Vaping Use: Never used  Substance and Sexual Activity   Alcohol use: No    Alcohol/week: 0.0 standard drinks of alcohol   Drug use: No   Sexual activity: Not Currently  Other Topics Concern   Not on file  Social History Narrative   RETIRED   Married 23 yrs, currently divorced   Daughter born '65, Son born '69; 2 grandchildren - 1 here, 1 in [place]   Brother stays with her sometimes.   Social Determinants of Health   Financial Resource Strain: Low Risk  (02/22/2023)   Overall Financial Resource Strain (CARDIA)    Difficulty of Paying Living Expenses: Not hard at all  Food Insecurity: No Food Insecurity (02/22/2023)   Hunger Vital Sign    Worried About Running Out of Food in  the Last Year: Never true    Ran Out of Food in the Last Year: Never true  Transportation Needs: No Transportation Needs (02/22/2023)   PRAPARE - Administrator, Civil Service (Medical): No    Lack of Transportation (Non-Medical): No  Physical Activity: Inactive (02/22/2023)   Exercise Vital Sign    Days of Exercise per Week: 0 days    Minutes of Exercise per Session: 0 min  Stress: No Stress Concern Present (02/22/2023)   [employer] of Occupational Health - Occupational Stress Questionnaire    Feeling of Stress :  Not at all  Social Connections: Socially Isolated (02/22/2023)   Social Connection and Isolation Panel [NHANES]    Frequency of Communication with Friends and Family: More than three times a week    Frequency of Social Gatherings with Friends and Family: Three times a week    Attends Religious Services: Never    Active Member of Clubs or Organizations: No    Attends Engineer, structural: Never    Marital Status: Divorced    Tobacco Counseling Counseling given: Not Answered   Clinical Intake:  Pre-visit preparation completed: Yes  Pain : 0-10 Pain Score: 5  Pain Type: Chronic pain Pain Location: Generalized Pain Descriptors / Indicators: Aching Pain Onset: More than a month ago Pain Frequency: Intermittent     BMI - recorded: 44.25 Nutritional Status: BMI > 30  Obese Nutritional Risks: None Diabetes: No  How often do you need to have someone help you when you read instructions, pamphlets, or other written materials from your doctor or pharmacy?: 1 - Never  Diabetic? Nutrition Risk Assessment:  Has the patient had any N/V/D within the last 2 months?  No  Does the patient have any non-healing wounds?  No  Has the patient had any unintentional weight loss or weight gain?  No   Diabetes:  Is the patient diabetic?  Yes  If diabetic, was a CBG obtained today?  No  Did the patient bring in their glucometer from home?  No  How often  do you monitor your CBG's? QD.   Financial Strains and Diabetes Management:  Are you having any financial strains with the device, your supplies or your medication?  No - sees CCM .  Does the patient want to be seen by Chronic Care Management for management of their diabetes?   Already seeing CCM Would the patient like to be referred to a Nutritionist or for Diabetic Management?  No   Diabetic Exams:  Diabetic Eye Exam: Overdue for diabetic eye exam. Pt has been advised about the importance in completing this exam. Patient advised to call and schedule an eye exam. Diabetic Foot Exam: Completed 10/05/2022    Interpreter Needed?: No  Information entered by :: Ellieana Dolecki, LPN   Activities of Daily Living    02/22/2023    9:10 AM  In your present state of health, do you have any difficulty performing the following activities:  Hearing? 0  Vision? 0  Difficulty concentrating or making decisions? 0  Walking or climbing stairs? 1  Comment mild  Dressing or bathing? 0  Doing errands, shopping? 0  Preparing Food and eating ? N  Using the Toilet? N  In the past six months, have you accidently leaked urine? N  Do you have problems with loss of bowel control? N  Managing your Medications? N  Managing your Finances? N  Housekeeping or managing your Housekeeping? N    Patient Care Team: Etta Grandchild, MD as PCP - General (Internal Medicine)  Indicate any recent Medical Services you may have received from other than [facility] providers in the past year (date may be approximate).     Assessment:   This is a routine wellness examination for [place].  Hearing/Vision screen Hearing Screening - Comments:: Denies hearing difficulties   Vision Screening - Comments:: Wears rx glasses - behind with routine eye exams [facility]  Dietary issues and exercise activities discussed: Current Exercise Habits: The patient does not participate in regular exercise at  present,  Exercise limited by: orthopedic condition(s)   Goals Addressed             This Visit's Progress    lose weight    Not on track    Increase the amount of physical activity like chair exercises and continue decrease carbohydrates and sugar,  drink plenty of water, enjoy life and family.     Patient Stated   Not on track    I want to increase my physical activity by walking, doing chair exercises and buy foot pedal equipment.       Depression Screen    02/22/2023    9:06 AM 01/26/2023    9:25 AM 10/05/2022    1:19 PM 01/28/2022    4:04 PM 03/19/2021   12:47 PM 05/22/2020   11:50 AM 01/16/2020    9:42 AM  PHQ 2/9 Scores  PHQ - 2 Score 0 0 0 0 0 0 0    Fall Risk    02/22/2023    8:54 AM 01/26/2023    9:25 AM 01/24/2023    4:35 PM 10/05/2022    1:19 PM 01/28/2022    4:07 PM  Fall Risk   Falls in the past year? 1 0  0 0  Number falls in past yr: 0 0  0 0  Injury with Fall? 0 0  0 0  Risk for fall due to : History of fall(s);Orthopedic patient;Impaired balance/gait;Other (Comment) No Fall Risks Impaired balance/gait;Other (Comment) No Fall Risks No Fall Risks  Risk for fall due to: Comment intermittent vertigo  acute dizziness    Follow up Education provided;Falls prevention discussed Falls evaluation completed Education provided;Falls prevention discussed Falls evaluation completed Falls evaluation completed    FALL RISK PREVENTION PERTAINING TO THE HOME:  Any stairs in or around the home? No  If so, are there any without handrails? No  Home free of loose throw rugs in walkways, pet beds, electrical cords, etc? No  Adequate lighting in your home to reduce risk of falls? Yes   ASSISTIVE DEVICES UTILIZED TO PREVENT FALLS:  Life alert? No  Use of a cane, walker or w/c? Yes  Grab bars in the bathroom? Yes  Shower chair or bench in shower? No  Elevated toilet seat or a handicapped toilet? No   TIMED UP AND GO:  Was the test performed? No . Televisit.  Cognitive  Function:    08/22/2018    2:13 PM 08/14/2017   11:50 AM  MMSE - Mini Mental State Exam  Orientation to time 5 5  Orientation to Place 5 5  Registration 3 3  Attention/ Calculation 5 5  Recall 1 2  Language- name 2 objects 2 2  Language- repeat 1 1  Language- follow 3 step command 3 3  Language- read & follow direction 1 1  Write a sentence 1 1  Copy design 1 1  Total score 28 29        02/22/2023    9:12 AM  6CIT Screen  What Year? 0 points  What month? 0 points  What time? 0 points  Count back from 20 0 points  Months in reverse 0 points  Repeat phrase 0 points  Total Score 0 points    Immunizations Immunization History  Administered Date(s) Administered   Fluad Quad(high Dose 65+) 08/05/2019, 08/30/2021   Influenza Split 08/15/2012   Influenza Whole 07/15/2008, 07/26/2010, 07/19/2011   Influenza, High Dose Seasonal PF 09/04/2013, 09/27/2016, 08/14/2017, 08/22/2018   Influenza,inj,Quad PF,6+ Mos  07/11/2014   Influenza-Unspecified 08/15/2015, 07/23/2020, 07/26/2022   Moderna Covid-19 Vaccine Bivalent Booster 3081yrs & up 07/06/2021   Moderna SARS-COV2 Booster Vaccination 09/16/2020, 08/16/2022   Moderna Sars-Covid-2 Vaccination 12/27/2019, 01/24/2020   Pneumococcal Conjugate-13 04/08/2014   Pneumococcal Polysaccharide-23 02/14/2012, 08/05/2019   Td 09/16/2008   Tdap 09/25/2018   Zoster, Live 07/09/2015    TDAP status: Up to date  Flu Vaccine status: Up to date  Pneumococcal vaccine status: Up to date  Covid-19 vaccine status: Completed vaccines  Qualifies for Shingles Vaccine? Yes   Zostavax completed Yes   Shingrix Completed?: No.    Education has been provided regarding the importance of this vaccine. Patient has been advised to call insurance company to determine out of pocket expense if they have not yet received this vaccine. Advised may also receive vaccine at local pharmacy or Health Dept. Verbalized acceptance and understanding.  Screening  Tests Health Maintenance  Topic Date Due   Hepatitis C Screening  Never done   OPHTHALMOLOGY EXAM  03/11/2021   COVID-19 Vaccine (6 - 2023-24 season) 10/11/2022   HEMOGLOBIN A1C  06/01/2023   INFLUENZA VACCINE  06/15/2023   Diabetic kidney evaluation - Urine ACR  07/15/2023   FOOT EXAM  10/06/2023   Diabetic kidney evaluation - eGFR measurement  01/20/2024   Medicare Annual Wellness (AWV)  02/22/2024   DTaP/Tdap/Td (3 - Td or Tdap) 09/25/2028   Pneumonia Vaccine 465+ Years old  Completed   DEXA SCAN  Completed   HPV VACCINES  Aged Out   Fecal DNA (Cologuard)  Discontinued   Zoster Vaccines- Shingrix  Discontinued    Health Maintenance  Health Maintenance Due  Topic Date Due   Hepatitis C Screening  Never done   OPHTHALMOLOGY EXAM  03/11/2021   COVID-19 Vaccine (6 - 2023-24 season) 10/11/2022    Colorectal cancer screening: No longer required.   Mammogram status: Completed 10/18/2021. Repeat every year  Bone Density status: Completed 01/20/2016. Results reflect: Bone density results: NORMAL. Repeat every 5 years.  Lung Cancer Screening: (Low Dose CT Chest recommended if Age 34-80 years, 30 pack-year currently smoking OR have quit w/in 15years.) does not qualify.   Additional Screening:  Hepatitis C Screening: does not qualify  Vision Screening: Recommended annual ophthalmology exams for early detection of glaucoma and other disorders of the eye. Is the patient up to date with their annual eye exam?  Yes  Who is the provider or what is the name of the office in which the patient attends annual eye exams? [facility] If pt is not established with a provider, would they like to be referred to a provider to establish care? No .   Dental Screening: Recommended annual dental exams for proper oral hygiene  Community Resource Referral / Chronic Care Management: CRR required this visit?  Yes   CCM required this visit?  Yes      Plan:     I have personally  reviewed and noted the following in the patient's chart:   Medical and social history Use of alcohol, tobacco or illicit drugs  Current medications and supplements including opioid prescriptions. Patient is not currently taking opioid prescriptions. Functional ability and status Nutritional status Physical activity Advanced directives List of other physicians Hospitalizations, surgeries, and ER visits in previous 12 months Vitals Screenings to include cognitive, depression, and falls Referrals and appointments  In addition, I have reviewed and discussed with patient certain preventive protocols, quality metrics, and best practice recommendations. A written personalized  care plan for preventive services as well as general preventive health recommendations were provided to patient.     [place] Constable, LPN   1/84/0375   Nurse Notes: wants rx elevated toilet seat, new cpap - hers is broken - sent separate phone note. Sent CCM referral to help get life alert.

## 2023-02-22 NOTE — Telephone Encounter (Signed)
She would like rx for an elevated toilet seat.  Also, she hasn't used her CPAP in a long time because it is broken. Can you write her new rx?

## 2023-02-22 NOTE — Patient Instructions (Signed)
[name] , Thank you for taking time to come for your Medicare Wellness Visit. I appreciate your ongoing commitment to your health goals. Please review the following plan we discussed and let me know if I can assist you in the future.   These are the goals we discussed:  Goals      Client understands the importance of follow-up with providers by attending scheduled visits     My goal is to lose some weight.     lose weight      Increase the amount of physical activity like chair exercises and continue decrease carbohydrates and sugar,  drink plenty of water, enjoy life and family.     Patient Stated     I want to increase my physical activity by walking, doing chair exercises and buy foot pedal equipment.     Weight < 300 lb (136.079 kg)     BMI ;48;  reviewed and educated Educated regarding online nutrition programs as WikiBlast.com.cychosemyplate.gov and LimitLaws.com.cymyfitnesspal.com  Plans to go back to Physician Weight loss with past success   [facility]   Address: [address], [place], [place] [address]  Phone: [phone]                This is a list of the screening recommended for you and due dates:  Health Maintenance  Topic Date Due   Hepatitis C Screening: USPSTF Recommendation to screen - Ages 6218-79 yo.  Never done   Eye exam for diabetics  03/11/2021   COVID-19 Vaccine (6 - 2023-24 season) 10/11/2022   Hemoglobin A1C  06/01/2023   Flu Shot  06/15/2023   Yearly kidney health urinalysis for diabetes  07/15/2023   Complete foot exam   10/06/2023   Yearly kidney function blood test for diabetes  01/20/2024   Medicare Annual Wellness Visit  02/22/2024   DTaP/Tdap/Td vaccine (3 - Td or Tdap) 09/25/2028   Pneumonia Vaccine  Completed   DEXA scan (bone density measurement)  Completed   HPV Vaccine  Aged Out   Cologuard (Stool DNA test)  Discontinued   Zoster (Shingles) Vaccine  Discontinued    Advanced directives: Advance directive  discussed with you today. Even though you declined this today, please call our office should you change your mind, and we can give you the proper paperwork for you to fill out.   Conditions/risks identified: Each day, aim for 6 glasses of water, plenty of protein in your diet and try to get up and walk/ stretch every hour for 5-10 minutes at a time.    Next appointment: Follow up in one year for your annual wellness visit    Preventive Care 65 Years and Older, Female Preventive care refers to lifestyle choices and visits with your health care provider that can promote health and wellness. What does preventive care include? A yearly physical exam. This is also called an annual well check. Dental exams once or twice a year. Routine eye exams. Ask your health care provider how often you should have your eyes checked. Personal lifestyle choices, including: Daily care of your teeth and gums. Regular physical activity. Eating a healthy diet. Avoiding tobacco and drug use. Limiting alcohol use. Practicing safe sex. Taking low-dose aspirin every day. Taking vitamin and mineral supplements as recommended by your health care provider. What happens during an annual well check? The services and screenings done by your health care provider during your annual well check will depend on  your age, overall health, lifestyle risk factors, and family history of disease. Counseling  Your health care provider may ask you questions about your: Alcohol use. Tobacco use. Drug use. Emotional well-being. Home and relationship well-being. Sexual activity. Eating habits. History of falls. Memory and ability to understand (cognition). Work and work Astronomer. Reproductive health. Screening  You may have the following tests or measurements: Height, weight, and BMI. Blood pressure. Lipid and cholesterol levels. These may be checked every 5 years, or more frequently if you are over 7 years old. Skin  check. Lung cancer screening. You may have this screening every year starting at age 55 if you have a 30-pack-year history of smoking and currently smoke or have quit within the past 15 years. Fecal occult blood test (FOBT) of the stool. You may have this test every year starting at age 19. Flexible sigmoidoscopy or colonoscopy. You may have a sigmoidoscopy every 5 years or a colonoscopy every 10 years starting at age 10. Hepatitis C blood test. Hepatitis B blood test. Sexually transmitted disease (STD) testing. Diabetes screening. This is done by checking your blood sugar (glucose) after you have not eaten for a while (fasting). You may have this done every 1-3 years. Bone density scan. This is done to screen for osteoporosis. You may have this done starting at age 61. Mammogram. This may be done every 1-2 years. Talk to your health care provider about how often you should have regular mammograms. Talk with your health care provider about your test results, treatment options, and if necessary, the need for more tests. Vaccines  Your health care provider may recommend certain vaccines, such as: Influenza vaccine. This is recommended every year. Tetanus, diphtheria, and acellular pertussis (Tdap, Td) vaccine. You may need a Td booster every 10 years. Zoster vaccine. You may need this after age 64. Pneumococcal 13-valent conjugate (PCV13) vaccine. One dose is recommended after age 63. Pneumococcal polysaccharide (PPSV23) vaccine. One dose is recommended after age 41. Talk to your health care provider about which screenings and vaccines you need and how often you need them. This information is not intended to replace advice given to you by your health care provider. Make sure you discuss any questions you have with your health care provider. Document Released: 11/27/2015 Document Revised: 07/20/2016 Document Reviewed: 09/01/2015 Elsevier Interactive Patient Education  2017 [employer].  Fall  Prevention in the Home Falls can cause injuries. They can happen to people of all ages. There are many things you can do to make your home safe and to help prevent falls. What can I do on the outside of my home? Regularly fix the edges of walkways and driveways and fix any cracks. Remove anything that might make you trip as you walk through a door, such as a raised step or threshold. Trim any bushes or trees on the path to your home. Use bright outdoor lighting. Clear any walking paths of anything that might make someone trip, such as rocks or tools. Regularly check to see if handrails are loose or broken. Make sure that both sides of any steps have handrails. Any raised decks and porches should have guardrails on the edges. Have any leaves, snow, or ice cleared regularly. Use sand or salt on walking paths during winter. Clean up any spills in your garage right away. This includes oil or grease spills. What can I do in the bathroom? Use night lights. Install grab bars by the toilet and in the tub and shower. Do not  use towel bars as grab bars. Use non-skid mats or decals in the tub or shower. If you need to sit down in the shower, use a plastic, non-slip stool. Keep the floor dry. Clean up any water that spills on the floor as soon as it happens. Remove soap buildup in the tub or shower regularly. Attach bath mats securely with double-sided non-slip rug tape. Do not have throw rugs and other things on the floor that can make you trip. What can I do in the bedroom? Use night lights. Make sure that you have a light by your bed that is easy to reach. Do not use any sheets or blankets that are too big for your bed. They should not hang down onto the floor. Have a firm chair that has side arms. You can use this for support while you get dressed. Do not have throw rugs and other things on the floor that can make you trip. What can I do in the kitchen? Clean up any spills right away. Avoid  walking on wet floors. Keep items that you use a lot in easy-to-reach places. If you need to reach something above you, use a strong step stool that has a grab bar. Keep electrical cords out of the way. Do not use floor polish or wax that makes floors slippery. If you must use wax, use non-skid floor wax. Do not have throw rugs and other things on the floor that can make you trip. What can I do with my stairs? Do not leave any items on the stairs. Make sure that there are handrails on both sides of the stairs and use them. Fix handrails that are broken or loose. Make sure that handrails are as long as the stairways. Check any carpeting to make sure that it is firmly attached to the stairs. Fix any carpet that is loose or worn. Avoid having throw rugs at the top or bottom of the stairs. If you do have throw rugs, attach them to the floor with carpet tape. Make sure that you have a light switch at the top of the stairs and the bottom of the stairs. If you do not have them, ask someone to add them for you. What else can I do to help prevent falls? Wear shoes that: Do not have high heels. Have rubber bottoms. Are comfortable and fit you well. Are closed at the toe. Do not wear sandals. If you use a stepladder: Make sure that it is fully opened. Do not climb a closed stepladder. Make sure that both sides of the stepladder are locked into place. Ask someone to hold it for you, if possible. Clearly mark and make sure that you can see: Any grab bars or handrails. First and last steps. Where the edge of each step is. Use tools that help you move around (mobility aids) if they are needed. These include: Canes. Walkers. Scooters. Crutches. Turn on the lights when you go into a dark area. Replace any light bulbs as soon as they burn out. Set up your furniture so you have a clear path. Avoid moving your furniture around. If any of your floors are uneven, fix them. If there are any pets around  you, be aware of where they are. Review your medicines with your doctor. Some medicines can make you feel dizzy. This can increase your chance of falling. Ask your doctor what other things that you can do to help prevent falls. This information is not intended to replace advice  given to you by your health care provider. Make sure you discuss any questions you have with your health care provider. Document Released: 08/27/2009 Document Revised: 04/07/2016 Document Reviewed: 12/05/2014 Elsevier Interactive Patient Education  2017 [employer].

## 2023-02-23 ENCOUNTER — Telehealth: Payer: Self-pay | Admitting: *Deleted

## 2023-02-23 NOTE — Telephone Encounter (Signed)
   Telephone encounter was:  Successful.  02/23/2023 Name: Holly [name] MRN: [id number] DOB: [birth year]  Holly [name] is a 82 y.o. year old female who is a primary care patient of Etta Grandchild, MD . The community resource team was consulted for assistance with  life alert   Care guide performed the following interventions: Patient provided with information about care guide support team and interviewed to confirm resource needs Discussed resources to assist with life alert  . Will mail her life alert information  Follow Up Plan:  No further follow up planned at this time. The patient has been provided with needed resources. Holly Mao [facility] [place], Population Health [phone] [address] [place] , [place] [address] Email : [name]

## 2023-02-24 ENCOUNTER — Other Ambulatory Visit: Payer: Self-pay | Admitting: Internal Medicine

## 2023-02-24 DIAGNOSIS — G4733 Obstructive sleep apnea (adult) (pediatric): Secondary | ICD-10-CM

## 2023-02-24 NOTE — Addendum Note (Signed)
Addended by: Darryll Capers on: 02/24/2023 08:31 AM   Modules accepted: Orders

## 2023-03-17 DIAGNOSIS — M170 Bilateral primary osteoarthritis of knee: Secondary | ICD-10-CM | POA: Diagnosis not present

## 2023-04-03 ENCOUNTER — Other Ambulatory Visit: Payer: Self-pay | Admitting: Internal Medicine

## 2023-04-03 DIAGNOSIS — E118 Type 2 diabetes mellitus with unspecified complications: Secondary | ICD-10-CM

## 2023-04-11 ENCOUNTER — Other Ambulatory Visit: Payer: Self-pay | Admitting: Internal Medicine

## 2023-04-11 DIAGNOSIS — E118 Type 2 diabetes mellitus with unspecified complications: Secondary | ICD-10-CM

## 2023-04-11 DIAGNOSIS — I10 Essential (primary) hypertension: Secondary | ICD-10-CM

## 2023-04-25 ENCOUNTER — Ambulatory Visit (INDEPENDENT_AMBULATORY_CARE_PROVIDER_SITE_OTHER): Payer: Medicare PPO | Admitting: Internal Medicine

## 2023-04-25 ENCOUNTER — Encounter: Payer: Self-pay | Admitting: Internal Medicine

## 2023-04-25 VITALS — BP 130/84 | HR 69 | Temp 98.2°F | Resp 16 | Ht 69.5 in | Wt 313.0 lb

## 2023-04-25 DIAGNOSIS — I70211 Atherosclerosis of native arteries of extremities with intermittent claudication, right leg: Secondary | ICD-10-CM | POA: Diagnosis not present

## 2023-04-25 DIAGNOSIS — N1831 Chronic kidney disease, stage 3a: Secondary | ICD-10-CM

## 2023-04-25 DIAGNOSIS — E118 Type 2 diabetes mellitus with unspecified complications: Secondary | ICD-10-CM

## 2023-04-25 DIAGNOSIS — E785 Hyperlipidemia, unspecified: Secondary | ICD-10-CM | POA: Diagnosis not present

## 2023-04-25 DIAGNOSIS — E1121 Type 2 diabetes mellitus with diabetic nephropathy: Secondary | ICD-10-CM

## 2023-04-25 DIAGNOSIS — I10 Essential (primary) hypertension: Secondary | ICD-10-CM

## 2023-04-25 DIAGNOSIS — Z7985 Long-term (current) use of injectable non-insulin antidiabetic drugs: Secondary | ICD-10-CM

## 2023-04-25 DIAGNOSIS — R801 Persistent proteinuria, unspecified: Secondary | ICD-10-CM

## 2023-04-25 LAB — HEMOGLOBIN A1C: Hgb A1c MFr Bld: 8.1 % — ABNORMAL HIGH (ref 4.6–6.5)

## 2023-04-25 LAB — BASIC METABOLIC PANEL
BUN: 37 mg/dL — ABNORMAL HIGH (ref 6–23)
CO2: 25 mEq/L (ref 19–32)
Calcium: 9.5 mg/dL (ref 8.4–10.5)
Chloride: 102 mEq/L (ref 96–112)
Creatinine, Ser: 1.58 mg/dL — ABNORMAL HIGH (ref 0.40–1.20)
GFR: 30.85 mL/min — ABNORMAL LOW (ref >60.00)
Glucose, Bld: 140 mg/dL — ABNORMAL HIGH (ref 70–99)
Potassium: 4.1 mEq/L (ref 3.5–5.1)
Sodium: 136 mEq/L (ref 135–145)

## 2023-04-25 LAB — MICROALBUMIN / CREATININE URINE RATIO
Creatinine,U: 63.8 mg/dL
Microalb Creat Ratio: 30 mg/g (ref 0.0–30.0)
Microalb, Ur: 19.2 mg/dL — ABNORMAL HIGH (ref 0.0–1.9)

## 2023-04-25 MED ORDER — TIRZEPATIDE 2.5 MG/0.5ML ~~LOC~~ SOAJ: 2.5000 mg | SUBCUTANEOUS | 0 refills | Status: DC

## 2023-04-25 NOTE — Progress Notes (Unsigned)
Subjective:  Patient ID: Holly [name], female    DOB: [birth year]  Age: 82 y.o. MRN: [id number]  CC: No chief complaint on file.   HPI Holly [name] presents for ***  Outpatient Medications Prior to Visit  Medication Sig Dispense Refill   Accu-Chek FastClix Lancets MISC USE THREE TIMES DAILY 306 each 1   ACCU-CHEK GUIDE test strip USE THREE TIMES DAILY 300 strip 1   acetaminophen (TYLENOL) 500 MG tablet Take 1,000 mg by mouth every 6 (six) hours as needed for mild pain.     Blood Glucose Monitoring Suppl (ACCU-CHEK AVIVA PLUS) w/Device KIT Use to check blood sugars daily Dx E11.9 1 kit 0   carvedilol (COREG) 12.5 MG tablet TAKE 1 TABLET TWICE DAILY WITH MEALS 180 tablet 0   cetirizine (ZYRTEC) 10 MG tablet Take 10 mg by mouth daily.     Cholecalciferol 2000 units TABS Take 1 tablet (2,000 Units total) by mouth daily. 90 tablet 1   Continuous Blood Gluc Receiver (DEXCOM G6 RECEIVER) DEVI 1 Act by Does not apply route daily. 1 each 5   Continuous Blood Gluc Sensor (DEXCOM G6 SENSOR) MISC 1 Act by Does not apply route daily. 1 each 5   Continuous Blood Gluc Transmit (DEXCOM G6 TRANSMITTER) MISC 1 Act by Does not apply route daily. 1 each 5   Cyanocobalamin (VITAMIN B-12 CR PO) Take 1 tablet by mouth daily.     empagliflozin (JARDIANCE) 25 MG TABS tablet TAKE 1 TABLET EVERY DAY BEFORE BREAKFAST 90 tablet 0   Glucagon (GVOKE HYPOPEN 2-PACK) 1 MG/0.2ML SOAJ Inject 1 Act into the skin daily as needed. 2 mL 5   indapamide (LOZOL) 1.25 MG tablet TAKE 1 TABLET EVERY DAY 90 tablet 1   insulin glargine (LANTUS SOLOSTAR) 100 UNIT/ML Solostar Pen Inject 60 Units into the skin daily. 54 mL 1   Insulin Pen Needle (DROPLET PEN NEEDLES) 32G X 6 MM MISC USE DAILY 100 each 5   meclizine (ANTIVERT) 12.5 MG tablet Take 1 tablet (12.5 mg total) by mouth 3 (three) times daily as needed for dizziness. 90 tablet 1   metFORMIN (GLUCOPHAGE-XR) 750 MG 24 hr tablet Take 1 tablet (750 mg total) by mouth daily  with breakfast. 90 tablet 1   olmesartan (BENICAR) 20 MG tablet TAKE 1 TABLET EVERY DAY 90 tablet 0   ondansetron (ZOFRAN) 4 MG tablet Take 1 tablet (4 mg total) by mouth every 6 (six) hours as needed for nausea or vomiting. 12 tablet 0   atorvastatin (LIPITOR) 40 MG tablet TAKE 1 TABLET EVERY DAY 90 tablet 10   Finerenone (KERENDIA) 20 MG TABS Take 1 tablet by mouth daily. [employer] Patient Assistance Medication 90 tablet 0   No facility-administered medications prior to visit.    ROS Review of Systems  Objective:  BP 130/84 (BP Location: Left Arm, Patient Position: Sitting, Cuff Size: Large)   Pulse 69   Temp 98.2 F (36.8 C) (Oral)   Ht 5' 9.5" (1.765 m)   Wt (!) 313 lb (142 kg)   SpO2 92%   BMI 45.56 kg/m   BP Readings from Last 3 Encounters:  04/25/23 130/84  01/26/23 136/78  01/20/23 (!) 145/82    Wt Readings from Last 3 Encounters:  04/25/23 (!) 313 lb (142 kg)  02/22/23 (!) 304 lb (137.9 kg)  01/26/23 (!) 304 lb (137.9 kg)    Physical Exam  Lab Results  Component Value Date   WBC 6.5 01/20/2023   HGB  14.6 01/20/2023   HCT 44.7 01/20/2023   PLT 196 01/20/2023   GLUCOSE 140 (H) 04/25/2023   CHOL 176 12/01/2022   TRIG 152.0 (H) 12/01/2022   HDL 61.20 12/01/2022   LDLDIRECT 134.8 08/27/2010   LDLCALC 84 12/01/2022   ALT 21 12/01/2022   AST 21 12/01/2022   NA 136 04/25/2023   K 4.1 04/25/2023   CL 102 04/25/2023   CREATININE 1.58 (H) 04/25/2023   BUN 37 (H) 04/25/2023   CO2 25 04/25/2023   TSH 2.46 12/01/2022   HGBA1C 8.1 (H) 04/25/2023   MICROALBUR 19.2 (H) 04/25/2023    No results found.  Assessment & Plan:  Malignant hypertension -     Basic metabolic panel; Future  Persistent proteinuria -     Microalbumin / creatinine urine ratio; Future  Stage 3a chronic kidney disease (HCC) -     Basic metabolic panel; Future -     Microalbumin / creatinine urine ratio; Future -     Chauncey Mann; Take 1 tablet (20 mg total) by mouth daily. Bayer Patient  Assistance Medication  Dispense: 90 tablet; Refill: 1  Type II diabetes mellitus with manifestations (HCC) -     Basic metabolic panel; Future -     Hemoglobin A1c; Future -     Microalbumin / creatinine urine ratio; Future -     Tirzepatide; Inject 2.5 mg into the skin once a week.  Dispense: 2 mL; Refill: 0 -     Chauncey Mann; Take 1 tablet (20 mg total) by mouth daily. Bayer Patient Assistance Medication  Dispense: 90 tablet; Refill: 1  Hyperlipidemia with target LDL less than 100 -     Atorvastatin Calcium; Take 1 tablet (40 mg total) by mouth daily.  Dispense: 90 tablet; Refill: 1  Atherosclerosis of native artery of right lower extremity with intermittent claudication (HCC) -     Atorvastatin Calcium; Take 1 tablet (40 mg total) by mouth daily.  Dispense: 90 tablet; Refill: 1  Diabetic nephropathy associated with type 2 diabetes mellitus (HCC) -     Chauncey Mann; Take 1 tablet (20 mg total) by mouth daily. Bayer Patient Assistance Medication  Dispense: 90 tablet; Refill: 1     Follow-up: No follow-ups on file.  Sanda Linger, MD

## 2023-04-27 DIAGNOSIS — Z1231 Encounter for screening mammogram for malignant neoplasm of breast: Secondary | ICD-10-CM | POA: Diagnosis not present

## 2023-04-27 LAB — HM MAMMOGRAPHY

## 2023-04-28 ENCOUNTER — Encounter: Payer: Self-pay | Admitting: Internal Medicine

## 2023-04-28 MED ORDER — KERENDIA 20 MG PO TABS: 1.0000 | ORAL_TABLET | Freq: Every day | ORAL | 1 refills | Status: DC

## 2023-04-28 MED ORDER — ATORVASTATIN CALCIUM 40 MG PO TABS: 40.0000 mg | ORAL_TABLET | Freq: Every day | ORAL | 1 refills | Status: DC

## 2023-05-03 ENCOUNTER — Other Ambulatory Visit: Payer: Self-pay | Admitting: Internal Medicine

## 2023-05-03 DIAGNOSIS — E118 Type 2 diabetes mellitus with unspecified complications: Secondary | ICD-10-CM

## 2023-05-04 ENCOUNTER — Telehealth: Payer: Self-pay | Admitting: Internal Medicine

## 2023-05-04 NOTE — Telephone Encounter (Signed)
Per 6/14 Result letter:  Your A1C is down to 8.1% but your kidney function has declined. Are you taking kerendia?    Pt has been informed of results and stated that she is taking her Chauncey Mann every morning.

## 2023-05-04 NOTE — Telephone Encounter (Signed)
Patient called asking to speak with a nurse about her recent lab results. She would like a call back at [phone].

## 2023-05-23 ENCOUNTER — Telehealth: Payer: Self-pay | Admitting: Internal Medicine

## 2023-05-23 ENCOUNTER — Other Ambulatory Visit: Payer: Self-pay | Admitting: Internal Medicine

## 2023-05-23 DIAGNOSIS — E118 Type 2 diabetes mellitus with unspecified complications: Secondary | ICD-10-CM

## 2023-05-23 MED ORDER — TIRZEPATIDE 5 MG/0.5ML ~~LOC~~ SOAJ: 5.0000 mg | SUBCUTANEOUS | 0 refills | Status: DC

## 2023-05-23 NOTE — Telephone Encounter (Signed)
Prescription Request  05/23/2023  LOV: 04/25/2023  What is the name of the medication or equipment? [email or web address]  Have you contacted your pharmacy to request a refill? No   Which pharmacy would you like this sent to?  [facility] [address] [place], [place] - [address] [place] [address] Phone: [phone] Fax: [phone]    Patient notified that their request is being sent to the clinical staff for review and that they should receive a response within 2 business days.   Please advise at Mobile [phone] (mobile)

## 2023-05-26 ENCOUNTER — Telehealth: Payer: Self-pay | Admitting: Internal Medicine

## 2023-05-26 DIAGNOSIS — M170 Bilateral primary osteoarthritis of knee: Secondary | ICD-10-CM | POA: Diagnosis not present

## 2023-05-26 NOTE — Telephone Encounter (Signed)
Prescription Request  05/26/2023  LOV: 04/25/2023  What is the name of the medication or equipment? Holly [name]  Have you contacted your pharmacy to request a refill? Yes   Which pharmacy would you like this sent to?   [facility] - [place], [place] - [address] [place] [address] Phone: [phone] Fax: [phone]     Patient notified that their request is being sent to the clinical staff for review and that they should receive a response within 2 business days.   Please advise at Mobile [phone] (mobile)

## 2023-05-29 ENCOUNTER — Other Ambulatory Visit: Payer: Self-pay | Admitting: Internal Medicine

## 2023-05-29 DIAGNOSIS — E118 Type 2 diabetes mellitus with unspecified complications: Secondary | ICD-10-CM

## 2023-05-29 DIAGNOSIS — N1831 Chronic kidney disease, stage 3a: Secondary | ICD-10-CM

## 2023-05-29 DIAGNOSIS — E1121 Type 2 diabetes mellitus with diabetic nephropathy: Secondary | ICD-10-CM

## 2023-05-29 MED ORDER — KERENDIA 20 MG PO TABS: 1.0000 | ORAL_TABLET | Freq: Every day | ORAL | 1 refills | Status: DC

## 2023-06-20 DIAGNOSIS — M170 Bilateral primary osteoarthritis of knee: Secondary | ICD-10-CM | POA: Diagnosis not present

## 2023-06-20 DIAGNOSIS — M1712 Unilateral primary osteoarthritis, left knee: Secondary | ICD-10-CM | POA: Diagnosis not present

## 2023-06-26 ENCOUNTER — Other Ambulatory Visit: Payer: Self-pay | Admitting: Internal Medicine

## 2023-06-26 DIAGNOSIS — E118 Type 2 diabetes mellitus with unspecified complications: Secondary | ICD-10-CM

## 2023-06-26 DIAGNOSIS — I10 Essential (primary) hypertension: Secondary | ICD-10-CM

## 2023-07-04 DIAGNOSIS — M170 Bilateral primary osteoarthritis of knee: Secondary | ICD-10-CM | POA: Diagnosis not present

## 2023-07-04 DIAGNOSIS — M1712 Unilateral primary osteoarthritis, left knee: Secondary | ICD-10-CM | POA: Diagnosis not present

## 2023-07-04 DIAGNOSIS — M1711 Unilateral primary osteoarthritis, right knee: Secondary | ICD-10-CM | POA: Diagnosis not present

## 2023-07-13 DIAGNOSIS — I129 Hypertensive chronic kidney disease with stage 1 through stage 4 chronic kidney disease, or unspecified chronic kidney disease: Secondary | ICD-10-CM | POA: Diagnosis not present

## 2023-07-13 DIAGNOSIS — N1832 Chronic kidney disease, stage 3b: Secondary | ICD-10-CM | POA: Diagnosis not present

## 2023-07-13 DIAGNOSIS — E1122 Type 2 diabetes mellitus with diabetic chronic kidney disease: Secondary | ICD-10-CM | POA: Diagnosis not present

## 2023-07-13 DIAGNOSIS — N2581 Secondary hyperparathyroidism of renal origin: Secondary | ICD-10-CM | POA: Diagnosis not present

## 2023-07-13 DIAGNOSIS — D631 Anemia in chronic kidney disease: Secondary | ICD-10-CM | POA: Diagnosis not present

## 2023-07-13 DIAGNOSIS — N189 Chronic kidney disease, unspecified: Secondary | ICD-10-CM | POA: Diagnosis not present

## 2023-07-14 LAB — LAB REPORT - SCANNED: EGFR: 32

## 2023-08-12 DIAGNOSIS — H60503 Unspecified acute noninfective otitis externa, bilateral: Secondary | ICD-10-CM | POA: Diagnosis not present

## 2023-08-12 DIAGNOSIS — H9203 Otalgia, bilateral: Secondary | ICD-10-CM | POA: Diagnosis not present

## 2023-09-11 ENCOUNTER — Other Ambulatory Visit: Payer: Self-pay | Admitting: Internal Medicine

## 2023-09-11 DIAGNOSIS — E118 Type 2 diabetes mellitus with unspecified complications: Secondary | ICD-10-CM

## 2023-09-11 DIAGNOSIS — I10 Essential (primary) hypertension: Secondary | ICD-10-CM

## 2023-09-20 ENCOUNTER — Telehealth: Payer: Self-pay | Admitting: Internal Medicine

## 2023-09-20 NOTE — Telephone Encounter (Signed)
Pt came today dropped off some forms for her PCP to look over a fill out regarding her meds.. Looks like a re-enrollment for med assistance.  Once the paperwork is received please let us know pt would like the copies and notified once this is completed if possible. Paperwork is in Dr. Yetta Barre mailbox..  Please advise. Thanks

## 2023-09-21 ENCOUNTER — Other Ambulatory Visit: Payer: Self-pay

## 2023-09-21 DIAGNOSIS — E118 Type 2 diabetes mellitus with unspecified complications: Secondary | ICD-10-CM

## 2023-09-21 DIAGNOSIS — E1121 Type 2 diabetes mellitus with diabetic nephropathy: Secondary | ICD-10-CM

## 2023-09-21 DIAGNOSIS — N1831 Chronic kidney disease, stage 3a: Secondary | ICD-10-CM

## 2023-09-21 MED ORDER — KERENDIA 20 MG PO TABS: 1.0000 | ORAL_TABLET | Freq: Every day | ORAL | 0 refills | Status: DC

## 2023-09-22 ENCOUNTER — Telehealth: Payer: Self-pay | Admitting: Internal Medicine

## 2023-09-22 NOTE — Telephone Encounter (Signed)
Pt is having ear issues and don't know what to do she want to set a appt with her PCP. He's booked out too far for her. She asked me to ask if theres a way she can either get a referral to a ear doctor or can she be written a prescription?  Please advise. Thanks

## 2023-09-22 NOTE — Telephone Encounter (Signed)
Paperwork has been completed and picked up from the office.

## 2023-09-22 NOTE — Telephone Encounter (Signed)
Patient has been scheduled for Monday.

## 2023-09-22 NOTE — Telephone Encounter (Signed)
Unable to reach patient. LMTRC  

## 2023-09-25 ENCOUNTER — Ambulatory Visit (INDEPENDENT_AMBULATORY_CARE_PROVIDER_SITE_OTHER): Payer: Medicare PPO | Admitting: Family Medicine

## 2023-09-25 ENCOUNTER — Encounter: Payer: Self-pay | Admitting: Family Medicine

## 2023-09-25 VITALS — BP 134/72 | HR 80 | Temp 98.1°F | Resp 20 | Ht 69.5 in | Wt 307.0 lb

## 2023-09-25 DIAGNOSIS — H6013 Cellulitis of external ear, bilateral: Secondary | ICD-10-CM

## 2023-09-25 MED ORDER — NEOMYCIN-POLYMYXIN-HC 3.5-10000-1 OT SOLN: 4.0000 [drp] | Freq: Four times a day (QID) | OTIC | 0 refills | Status: AC

## 2023-09-25 MED ORDER — SULFAMETHOXAZOLE-TRIMETHOPRIM 800-160 MG PO TABS: 1.0000 | ORAL_TABLET | Freq: Two times a day (BID) | ORAL | 0 refills | Status: AC

## 2023-09-25 NOTE — Progress Notes (Signed)
Assessment & Plan:  1. Cellulitis of both ears - sulfamethoxazole-trimethoprim (BACTRIM DS) 800-160 MG tablet; Take 1 tablet by mouth 2 (two) times daily for 7 days.  Dispense: 14 tablet; Refill: 0 - neomycin-polymyxin-hydrocortisone (CORTISPORIN) OTIC solution; Place 4 drops into both ears 4 (four) times daily for 3 days.  Dispense: 10 mL; Refill: 0   Follow up plan: Return if symptoms worsen or fail to improve.  [name], MSN, APRN, FNP-C  Subjective:  HPI: Holly [name] is a 82 y.o. female presenting on 09/25/2023 for Ear Pain (HX: 4 weeks ago - went to urgent care for ear pain - got antibiotic and ear gtts/Then 2 weeks ago until now - having ear itching bilateral - red, dry and itching. )  Patient went to urgent care on 08/12/2023 due to bilateral ear pain.  She was prescribed an antibiotic and drops which resolved her symptoms.  Two weeks ago she started experiencing bilateral itching, redness, and dryness of her ears with the left being worse than the right.  She has been applying Vaseline and cream as with no improvement..  Cipro PO Neo/Poly/HC   ROS: Negative unless specifically indicated above in HPI.   Relevant past medical history reviewed and updated as indicated.   Allergies and medications reviewed and updated.   Current Outpatient Medications:    Accu-Chek FastClix Lancets MISC, USE THREE TIMES DAILY, Disp: 306 each, Rfl: 1   ACCU-CHEK GUIDE test strip, USE THREE TIMES DAILY, Disp: 300 strip, Rfl: 1   acetaminophen (TYLENOL) 500 MG tablet, Take 1,000 mg by mouth every 6 (six) hours as needed for mild pain., Disp: , Rfl:    atorvastatin (LIPITOR) 40 MG tablet, Take 1 tablet (40 mg total) by mouth daily., Disp: 90 tablet, Rfl: 1   Blood Glucose Monitoring Suppl (ACCU-CHEK AVIVA PLUS) w/Device KIT, Use to check blood sugars daily Dx E11.9, Disp: 1 kit, Rfl: 0   carvedilol (COREG) 12.5 MG tablet, TAKE 1 TABLET TWICE DAILY WITH MEALS, Disp: 180 tablet, Rfl: 1    cetirizine (ZYRTEC) 10 MG tablet, Take 10 mg by mouth daily., Disp: , Rfl:    Cholecalciferol 2000 units TABS, Take 1 tablet (2,000 Units total) by mouth daily., Disp: 90 tablet, Rfl: 1   Cyanocobalamin (VITAMIN B-12 CR PO), Take 1 tablet by mouth daily., Disp: , Rfl:    empagliflozin (JARDIANCE) 25 MG TABS tablet, TAKE 1 TABLET EVERY DAY BEFORE BREAKFAST, Disp: 90 tablet, Rfl: 0   Finerenone (KERENDIA) 20 MG TABS, Take 1 tablet (20 mg total) by mouth daily. [employer] Patient Assistance Medication, Disp: 90 tablet, Rfl: 0   Glucagon (GVOKE HYPOPEN 2-PACK) 1 MG/0.2ML SOAJ, Inject 1 Act into the skin daily as needed., Disp: 2 mL, Rfl: 5   indapamide (LOZOL) 1.25 MG tablet, TAKE 1 TABLET EVERY DAY, Disp: 90 tablet, Rfl: 1   insulin glargine (LANTUS SOLOSTAR) 100 UNIT/ML Solostar Pen, Inject 60 Units into the skin daily., Disp: 54 mL, Rfl: 1   Insulin Pen Needle (DROPLET PEN NEEDLES) 32G X 6 MM MISC, USE DAILY, Disp: 100 each, Rfl: 5   metFORMIN (GLUCOPHAGE-XR) 750 MG 24 hr tablet, TAKE 1 TABLET (750 MG TOTAL) BY MOUTH DAILY WITH BREAKFAST., Disp: 90 tablet, Rfl: 1   olmesartan (BENICAR) 20 MG tablet, TAKE 1 TABLET EVERY DAY, Disp: 90 tablet, Rfl: 0   ondansetron (ZOFRAN) 4 MG tablet, Take 1 tablet (4 mg total) by mouth every 6 (six) hours as needed for nausea or vomiting. (Patient not taking:  Reported on 09/25/2023), Disp: 12 tablet, Rfl: 0  Allergies  Allergen Reactions   Nifedipine Other (See Comments)    Leg edema   Amlodipine Other (See Comments)    edema   Oxycodone     nausea   Penicillins Hives and Other (See Comments)    fever    Objective:   BP 134/72   Pulse 80   Temp 98.1 F (36.7 C)   Resp 20   Ht 5' 9.5" (1.765 m)   Wt (!) 307 lb (139.3 kg)   BMI 44.69 kg/m    Physical Exam Vitals reviewed.  Constitutional:      General: She is not in acute distress.    Appearance: Normal appearance. She is not ill-appearing, toxic-appearing or diaphoretic.  HENT:     Head:  Normocephalic and atraumatic.     Right Ear: Tympanic membrane normal. Swelling and tenderness present. No middle ear effusion. There is no impacted cerumen. No foreign body.     Left Ear: Tympanic membrane normal. Swelling and tenderness present.  No middle ear effusion. There is no impacted cerumen. No foreign body.     Ears:     Comments: Bilateral external ears erythematous, swollen, and tender to palpation (left > right). Ear canal erythematous and swollen with fungal appearance. Eyes:     General: No scleral icterus.       Right eye: No discharge.        Left eye: No discharge.     Conjunctiva/sclera: Conjunctivae normal.  Cardiovascular:     Rate and Rhythm: Normal rate.  Pulmonary:     Effort: Pulmonary effort is normal. No respiratory distress.  Musculoskeletal:        General: Normal range of motion.     Cervical back: Normal range of motion.  Skin:    General: Skin is warm and dry.     Capillary Refill: Capillary refill takes less than 2 seconds.  Neurological:     General: No focal deficit present.     Mental Status: She is alert and oriented to person, place, and time. Mental status is at baseline.  Psychiatric:        Mood and Affect: Mood normal.        Behavior: Behavior normal.        Thought Content: Thought content normal.        Judgment: Judgment normal.

## 2023-09-27 LAB — HM DIABETES EYE EXAM

## 2023-10-03 NOTE — Telephone Encounter (Signed)
Patient received a call regarding the paperwork. It was received, but the date she wrote was incorrect. It was supposed to be the date she signed it, but she wrote her birthday by mistake. Patient would like to know if the paperwork can be re-sent with the date next to her signature saying 09/20/2023. Best callback is [phone].

## 2023-10-03 NOTE — Telephone Encounter (Signed)
Spoke with patient, she advised me that they will fax me the paper I will correct the date and fax it back.

## 2023-10-04 ENCOUNTER — Telehealth: Payer: Self-pay

## 2023-10-04 NOTE — Telephone Encounter (Signed)
Calling patient to advise her that she would have to come in the office to resign her patient assistance forms for [employer], They can not send me the paperwork that I already filled out and she mailed. Unable to reach her at the moment. Left a detailed message.

## 2023-10-13 ENCOUNTER — Other Ambulatory Visit: Payer: Self-pay | Admitting: Internal Medicine

## 2023-10-13 DIAGNOSIS — E118 Type 2 diabetes mellitus with unspecified complications: Secondary | ICD-10-CM

## 2023-10-23 ENCOUNTER — Encounter: Payer: Self-pay | Admitting: Family Medicine

## 2023-10-23 ENCOUNTER — Ambulatory Visit (INDEPENDENT_AMBULATORY_CARE_PROVIDER_SITE_OTHER): Payer: Medicare PPO | Admitting: Family Medicine

## 2023-10-23 VITALS — BP 126/68 | HR 66 | Temp 97.5°F | Resp 20 | Ht 69.5 in | Wt 306.0 lb

## 2023-10-23 DIAGNOSIS — L720 Epidermal cyst: Secondary | ICD-10-CM | POA: Diagnosis not present

## 2023-10-23 DIAGNOSIS — Z0000 Encounter for general adult medical examination without abnormal findings: Secondary | ICD-10-CM

## 2023-10-23 DIAGNOSIS — H60543 Acute eczematoid otitis externa, bilateral: Secondary | ICD-10-CM

## 2023-10-23 DIAGNOSIS — L2084 Intrinsic (allergic) eczema: Secondary | ICD-10-CM

## 2023-10-23 MED ORDER — TRIAMCINOLONE ACETONIDE 0.1 % EX CREA: 1.0000 | TOPICAL_CREAM | Freq: Two times a day (BID) | CUTANEOUS | 2 refills | Status: AC

## 2023-10-23 MED ORDER — TRIAMCINOLONE ACETONIDE 0.1 % EX CREA: 1.0000 | TOPICAL_CREAM | Freq: Two times a day (BID) | CUTANEOUS | 2 refills | Status: DC

## 2023-10-23 NOTE — Patient Instructions (Signed)
Telfa Non-adherent dressing

## 2023-10-23 NOTE — Progress Notes (Signed)
Assessment & Plan:  1. Eczema of both external ears Education provided on eczema. - Ambulatory referral to Dermatology - triamcinolone cream (KENALOG) 0.1 %; Apply 1 Application topically 2 (two) times daily.  Dispense: 80 g; Refill: 2  2. Intrinsic eczema Education provided on eczema. - Ambulatory referral to Dermatology - triamcinolone cream (KENALOG) 0.1 %; Apply 1 Application topically 2 (two) times daily.  Dispense: 80 g; Refill: 2  3. Epidermoid cyst of skin of back Education provided on epidermoid cyst. - Ambulatory referral to Dermatology  4. Healthcare maintenance Eye exam records requested from [facility].   Follow up plan: Return if symptoms worsen or fail to improve.  Holly Boston, MSN, APRN, FNP-C  Subjective:  HPI: Holly [name] is a 82 y.o. female presenting on 10/23/2023 for ear itching (Ongoing - got better and then worse again/), Rash (On Chest only - noticed about 2 weeks ago /), and Cyst (On back of neck - was lanced a long time ago, she done heat compressions and it burst. Would like NP to look at it and make sure it is ok. /)  Patient was treated with Bactrim for cellulitis of both ears a month ago.  She reports ear pain, itching, and redness all completely resolved but itching returned 1 week later.  She also reports a rash that started on her chest two weeks ago.  It does itch.  Denies any deodorant, soaps, laundry detergent, fabric softeners, new unwashed clothing, etc.  States she has been using CeraVe lotion but only on her arms and legs.  The rash has not changed.  States it feels rough like sandpaper.  She has been applying cortisone cream which is somewhat helpful.  Patient states she has a cyst on her neck that burst three days ago after applying a heating pad.  She states it was painful and itching.  She reports the exact same place on her neck had to be lanced and drained two and half years ago.  She reportedly got her eye exam recently at  [facility].    ROS: Negative unless specifically indicated above in HPI.   Relevant past medical history reviewed and updated as indicated.   Allergies and medications reviewed and updated.   Current Outpatient Medications:    Accu-Chek FastClix Lancets MISC, USE THREE TIMES DAILY, Disp: 306 each, Rfl: 1   ACCU-CHEK GUIDE test strip, USE THREE TIMES DAILY, Disp: 300 strip, Rfl: 1   acetaminophen (TYLENOL) 500 MG tablet, Take 1,000 mg by mouth every 6 (six) hours as needed for mild pain., Disp: , Rfl:    atorvastatin (LIPITOR) 40 MG tablet, Take 1 tablet (40 mg total) by mouth daily., Disp: 90 tablet, Rfl: 1   Blood Glucose Monitoring Suppl (ACCU-CHEK AVIVA PLUS) w/Device KIT, Use to check blood sugars daily Dx E11.9, Disp: 1 kit, Rfl: 0   carvedilol (COREG) 12.5 MG tablet, TAKE 1 TABLET TWICE DAILY WITH MEALS, Disp: 180 tablet, Rfl: 0   cetirizine (ZYRTEC) 10 MG tablet, Take 10 mg by mouth daily., Disp: , Rfl:    Cholecalciferol 2000 units TABS, Take 1 tablet (2,000 Units total) by mouth daily., Disp: 90 tablet, Rfl: 1   Cyanocobalamin (VITAMIN B-12 CR PO), Take 1 tablet by mouth daily., Disp: , Rfl:    empagliflozin (JARDIANCE) 25 MG TABS tablet, TAKE 1 TABLET EVERY DAY BEFORE BREAKFAST, Disp: 90 tablet, Rfl: 0   Finerenone (KERENDIA) 20 MG TABS, Take 1 tablet (20 mg total) by mouth daily. [employer]  Patient Assistance Medication, Disp: 90 tablet, Rfl: 0   Glucagon (GVOKE HYPOPEN 2-PACK) 1 MG/0.2ML SOAJ, Inject 1 Act into the skin daily as needed., Disp: 2 mL, Rfl: 5   indapamide (LOZOL) 1.25 MG tablet, TAKE 1 TABLET EVERY DAY, Disp: 90 tablet, Rfl: 1   insulin glargine (LANTUS SOLOSTAR) 100 UNIT/ML Solostar Pen, Inject 60 Units into the skin daily., Disp: 54 mL, Rfl: 1   Insulin Pen Needle (DROPLET PEN NEEDLES) 32G X 6 MM MISC, USE DAILY, Disp: 100 each, Rfl: 5   metFORMIN (GLUCOPHAGE-XR) 750 MG 24 hr tablet, TAKE 1 TABLET DAILY WITH BREAKFAST., Disp: 90 tablet, Rfl: 0   olmesartan  (BENICAR) 20 MG tablet, TAKE 1 TABLET EVERY DAY, Disp: 90 tablet, Rfl: 0   ondansetron (ZOFRAN) 4 MG tablet, Take 1 tablet (4 mg total) by mouth every 6 (six) hours as needed for nausea or vomiting., Disp: 12 tablet, Rfl: 0  Allergies  Allergen Reactions   Nifedipine Other (See Comments)    Leg edema   Amlodipine Other (See Comments)    edema   Oxycodone     nausea   Penicillins Hives and Other (See Comments)    fever    Objective:   BP 126/68   Pulse 66   Temp (!) 97.5 F (36.4 C)   Resp 20   Ht 5' 9.5" (1.765 m)   Wt (!) 306 lb (138.8 kg)   BMI 44.54 kg/m    Physical Exam Vitals reviewed.  Constitutional:      General: She is not in acute distress.    Appearance: Normal appearance. She is not ill-appearing, toxic-appearing or diaphoretic.  HENT:     Head: Normocephalic and atraumatic.     Right Ear: Tympanic membrane and ear canal normal.     Left Ear: Tympanic membrane and ear canal normal.     Ears:     Comments: Both external ears appear scaly and thickened.  No erythema. Eyes:     General: No scleral icterus.       Right eye: No discharge.        Left eye: No discharge.     Conjunctiva/sclera: Conjunctivae normal.  Cardiovascular:     Rate and Rhythm: Normal rate.  Pulmonary:     Effort: Pulmonary effort is normal. No respiratory distress.  Musculoskeletal:        General: Normal range of motion.     Cervical back: Normal range of motion.  Skin:    General: Skin is warm and dry.     Capillary Refill: Capillary refill takes less than 2 seconds.     Findings: Rash present. Rash is scaling (with mild erythema to the chest).  Neurological:     General: No focal deficit present.     Mental Status: She is alert and oriented to person, place, and time. Mental status is at baseline.  Psychiatric:        Mood and Affect: Mood normal.        Behavior: Behavior normal.        Thought Content: Thought content normal.        Judgment: Judgment normal.

## 2023-11-01 ENCOUNTER — Telehealth: Payer: Self-pay

## 2023-11-01 NOTE — Telephone Encounter (Signed)
Lantus Solostar has been received and patient has been made aware. She gave a verbal understanding and states that she will have her son pick it up for her.

## 2023-11-02 NOTE — Telephone Encounter (Signed)
Patient has picked up insulin.

## 2023-11-07 ENCOUNTER — Other Ambulatory Visit: Payer: Self-pay | Admitting: Internal Medicine

## 2023-11-07 DIAGNOSIS — R600 Localized edema: Secondary | ICD-10-CM

## 2023-11-07 DIAGNOSIS — I10 Essential (primary) hypertension: Secondary | ICD-10-CM

## 2023-11-13 ENCOUNTER — Other Ambulatory Visit: Payer: Self-pay

## 2023-11-13 ENCOUNTER — Telehealth: Payer: Self-pay

## 2023-11-13 NOTE — Telephone Encounter (Signed)
Copied from CRM [id number]. Topic: Clinical - Prescription Issue >> Nov 13, 2023 11:39 AM Pascal Lux wrote: Reason for CRM: Patient called stated [employer] called her and advised her prescription on file for Indapamide 1.25mg  had expired and will need a new prescription sent over to [facility].

## 2023-11-13 NOTE — Telephone Encounter (Signed)
Medication refill and rx has been sent to Dr. Yetta Barre

## 2023-11-20 ENCOUNTER — Ambulatory Visit: Payer: Medicare PPO | Admitting: Internal Medicine

## 2023-11-27 ENCOUNTER — Other Ambulatory Visit: Payer: Self-pay | Admitting: Internal Medicine

## 2023-11-27 DIAGNOSIS — E118 Type 2 diabetes mellitus with unspecified complications: Secondary | ICD-10-CM

## 2023-11-27 DIAGNOSIS — I10 Essential (primary) hypertension: Secondary | ICD-10-CM

## 2023-12-01 ENCOUNTER — Telehealth: Payer: Self-pay

## 2023-12-01 NOTE — Telephone Encounter (Signed)
PAP: Patient assistance application Lantus for has been approved by PAP Companies: [employer] from 11/23/2023 to 11/13/2024. Medication should be delivered to PAP Delivery: Home For further shipping updates, please contact [employer] at [phone] Pt ID is: [id number]   PLEASE BE ADVISED LETTER FOR APPROVAL IS SCANNED INTO MEDIA OF CHART

## 2023-12-28 ENCOUNTER — Ambulatory Visit: Payer: Medicare PPO | Admitting: Internal Medicine

## 2023-12-28 ENCOUNTER — Encounter: Payer: Self-pay | Admitting: Internal Medicine

## 2023-12-28 VITALS — BP 148/90 | HR 67 | Temp 97.8°F | Ht 69.5 in | Wt 315.1 lb

## 2023-12-28 DIAGNOSIS — R3121 Asymptomatic microscopic hematuria: Secondary | ICD-10-CM

## 2023-12-28 DIAGNOSIS — I70211 Atherosclerosis of native arteries of extremities with intermittent claudication, right leg: Secondary | ICD-10-CM | POA: Diagnosis not present

## 2023-12-28 DIAGNOSIS — I10 Essential (primary) hypertension: Secondary | ICD-10-CM | POA: Diagnosis not present

## 2023-12-28 DIAGNOSIS — E118 Type 2 diabetes mellitus with unspecified complications: Secondary | ICD-10-CM | POA: Diagnosis not present

## 2023-12-28 DIAGNOSIS — I110 Hypertensive heart disease with heart failure: Secondary | ICD-10-CM

## 2023-12-28 DIAGNOSIS — N1831 Chronic kidney disease, stage 3a: Secondary | ICD-10-CM

## 2023-12-28 DIAGNOSIS — E785 Hyperlipidemia, unspecified: Secondary | ICD-10-CM | POA: Diagnosis not present

## 2023-12-28 DIAGNOSIS — E1121 Type 2 diabetes mellitus with diabetic nephropathy: Secondary | ICD-10-CM

## 2023-12-28 DIAGNOSIS — Z0001 Encounter for general adult medical examination with abnormal findings: Secondary | ICD-10-CM

## 2023-12-28 DIAGNOSIS — E559 Vitamin D deficiency, unspecified: Secondary | ICD-10-CM

## 2023-12-28 DIAGNOSIS — R600 Localized edema: Secondary | ICD-10-CM

## 2023-12-28 LAB — MICROALBUMIN / CREATININE URINE RATIO
Creatinine,U: 98.5 mg/dL
Microalb Creat Ratio: 1474.3 mg/g — ABNORMAL HIGH (ref 0.0–30.0)
Microalb, Ur: 145.3 mg/dL — ABNORMAL HIGH (ref 0.0–1.9)

## 2023-12-28 LAB — CBC WITH DIFFERENTIAL/PLATELET
Basophils Absolute: 0 10*3/uL (ref 0.0–0.1)
Basophils Relative: 0.5 % (ref 0.0–3.0)
Eosinophils Absolute: 0.2 10*3/uL (ref 0.0–0.7)
Eosinophils Relative: 3.2 % (ref 0.0–5.0)
HCT: 46.5 % — ABNORMAL HIGH (ref 36.0–46.0)
Hemoglobin: 14.8 g/dL (ref 12.0–15.0)
Lymphocytes Relative: 31.7 % (ref 12.0–46.0)
Lymphs Abs: 2.4 10*3/uL (ref 0.7–4.0)
MCHC: 31.8 g/dL (ref 30.0–36.0)
MCV: 87.3 fL (ref 78.0–100.0)
Monocytes Absolute: 0.7 10*3/uL (ref 0.1–1.0)
Monocytes Relative: 9.7 % (ref 3.0–12.0)
Neutro Abs: 4.1 10*3/uL (ref 1.4–7.7)
Neutrophils Relative %: 54.9 % (ref 43.0–77.0)
Platelets: 215 10*3/uL (ref 150.0–400.0)
RBC: 5.33 Mil/uL — ABNORMAL HIGH (ref 3.87–5.11)
RDW: 14.8 % (ref 11.5–15.5)
WBC: 7.5 10*3/uL (ref 4.0–10.5)

## 2023-12-28 LAB — LIPID PANEL
Cholesterol: 182 mg/dL (ref 0–200)
HDL: 74.3 mg/dL (ref >39.00)
LDL Cholesterol: 91 mg/dL (ref 0–99)
NonHDL: 107.84
Total CHOL/HDL Ratio: 2
Triglycerides: 84 mg/dL (ref 0.0–149.0)
VLDL: 16.8 mg/dL (ref 0.0–40.0)

## 2023-12-28 LAB — HEPATIC FUNCTION PANEL
ALT: 15 U/L (ref 0–35)
AST: 20 U/L (ref 0–37)
Albumin: 4.2 g/dL (ref 3.5–5.2)
Alkaline Phosphatase: 93 U/L (ref 39–117)
Bilirubin, Direct: 0.3 mg/dL (ref 0.0–0.3)
Total Bilirubin: 1.4 mg/dL — ABNORMAL HIGH (ref 0.2–1.2)
Total Protein: 7.8 g/dL (ref 6.0–8.3)

## 2023-12-28 LAB — BASIC METABOLIC PANEL
BUN: 22 mg/dL (ref 6–23)
CO2: 29 meq/L (ref 19–32)
Calcium: 9.6 mg/dL (ref 8.4–10.5)
Chloride: 103 meq/L (ref 96–112)
Creatinine, Ser: 1.32 mg/dL — ABNORMAL HIGH (ref 0.40–1.20)
GFR: 38.1 mL/min — ABNORMAL LOW (ref >60.00)
Glucose, Bld: 77 mg/dL (ref 70–99)
Potassium: 3.7 meq/L (ref 3.5–5.1)
Sodium: 140 meq/L (ref 135–145)

## 2023-12-28 LAB — TSH: TSH: 2.97 u[IU]/mL (ref 0.35–5.50)

## 2023-12-28 LAB — VITAMIN D 25 HYDROXY (VIT D DEFICIENCY, FRACTURES): VITD: 44.46 ng/mL (ref 30.00–100.00)

## 2023-12-28 MED ORDER — INDAPAMIDE 1.25 MG PO TABS: 1.2500 mg | ORAL_TABLET | Freq: Every day | ORAL | 1 refills | Status: DC

## 2023-12-28 MED ORDER — CARVEDILOL 12.5 MG PO TABS: 12.5000 mg | ORAL_TABLET | Freq: Two times a day (BID) | ORAL | 0 refills | Status: DC

## 2023-12-28 MED ORDER — METFORMIN HCL ER 750 MG PO TB24: 750.0000 mg | ORAL_TABLET | Freq: Every day | ORAL | 0 refills | Status: DC

## 2023-12-28 NOTE — Patient Instructions (Signed)

## 2023-12-28 NOTE — Progress Notes (Signed)
Subjective:  Patient ID: Holly [name], female    DOB: 23-May-1941  Age: 83 y.o. MRN: [id number]  CC: Annual Exam, Hypertension, Hyperlipidemia, and Diabetes   HPI Holly [name] presents for a CPX and f/up ----    Discussed the use of AI scribe software for clinical note transcription with the patient, who gave verbal consent to proceed.  History of Present Illness   Holly [name] is an 83 year old female who presents for a routine follow-up visit.  She has experienced a weight gain of nine pounds since December after discontinuing her injections, which she attributes to a change in medication required by her insurance company.  She is unaware of the cause of her anemia, and further investigation may be needed to determine the etiology.  No chest pain, shortness of breath, dizziness, lightheadedness, or stomach issues. No problems with her recent eye exam conducted in November. She is up to date with her flu vaccination and has a foot exam planned for today.       Outpatient Medications Prior to Visit  Medication Sig Dispense Refill   Accu-Chek FastClix Lancets MISC USE THREE TIMES DAILY 306 each 1   ACCU-CHEK GUIDE test strip USE THREE TIMES DAILY 300 strip 1   acetaminophen (TYLENOL) 500 MG tablet Take 1,000 mg by mouth every 6 (six) hours as needed for mild pain.     Blood Glucose Monitoring Suppl (ACCU-CHEK AVIVA PLUS) w/Device KIT Use to check blood sugars daily Dx E11.9 1 kit 0   cetirizine (ZYRTEC) 10 MG tablet Take 10 mg by mouth daily.     Cholecalciferol 2000 units TABS Take 1 tablet (2,000 Units total) by mouth daily. 90 tablet 1   Cyanocobalamin (VITAMIN B-12 CR PO) Take 1 tablet by mouth daily.     empagliflozin (JARDIANCE) 25 MG TABS tablet TAKE 1 TABLET EVERY DAY BEFORE BREAKFAST 90 tablet 0   Glucagon (GVOKE HYPOPEN 2-PACK) 1 MG/0.2ML SOAJ Inject 1 Act into the skin daily as needed. 2 mL 5   insulin glargine (LANTUS SOLOSTAR) 100 UNIT/ML Solostar Pen Inject  60 Units into the skin daily. 54 mL 1   Insulin Pen Needle (DROPLET PEN NEEDLES) 32G X 6 MM MISC USE DAILY 100 each 5   olmesartan (BENICAR) 20 MG tablet TAKE 1 TABLET EVERY DAY 90 tablet 0   triamcinolone cream (KENALOG) 0.1 % Apply 1 Application topically 2 (two) times daily. 80 g 2   atorvastatin (LIPITOR) 40 MG tablet Take 1 tablet (40 mg total) by mouth daily. 90 tablet 1   carvedilol (COREG) 12.5 MG tablet TAKE 1 TABLET TWICE DAILY WITH MEALS 180 tablet 0   Finerenone (KERENDIA) 20 MG TABS Take 1 tablet (20 mg total) by mouth daily. [employer] Patient Assistance Medication 90 tablet 0   indapamide (LOZOL) 1.25 MG tablet TAKE 1 TABLET EVERY DAY 90 tablet 1   metFORMIN (GLUCOPHAGE-XR) 750 MG 24 hr tablet TAKE 1 TABLET DAILY WITH BREAKFAST. 90 tablet 0   ondansetron (ZOFRAN) 4 MG tablet Take 1 tablet (4 mg total) by mouth every 6 (six) hours as needed for nausea or vomiting. 12 tablet 0   No facility-administered medications prior to visit.    ROS Review of Systems  Constitutional:  Positive for fatigue and unexpected weight change (wt gain). Negative for appetite change, chills, diaphoresis and fever.  HENT: Negative.    Eyes: Negative.   Respiratory:  Positive for shortness of breath. Negative for cough, chest tightness and wheezing.  Cardiovascular:  Positive for leg swelling. Negative for chest pain and palpitations.  Gastrointestinal:  Negative for abdominal pain, constipation, diarrhea, nausea and vomiting.  Endocrine: Negative.   Genitourinary: Negative.  Negative for difficulty urinating and dysuria.  Musculoskeletal:  Positive for arthralgias. Negative for myalgias.  Skin: Negative.   Neurological: Negative.  Negative for dizziness and weakness.  Hematological:  Negative for adenopathy. Does not bruise/bleed easily.  Psychiatric/Behavioral: Negative.      Objective:  BP (!) 148/90   Pulse 67   Temp 97.8 F (36.6 C) (Temporal)   Ht 5' 9.5" (1.765 m)   Wt (!) 315 lb 2 oz  (142.9 kg)   SpO2 97%   BMI 45.87 kg/m   BP Readings from Last 3 Encounters:  12/28/23 (!) 148/90  10/23/23 126/68  09/25/23 134/72    Wt Readings from Last 3 Encounters:  12/28/23 (!) 315 lb 2 oz (142.9 kg)  10/23/23 (!) 306 lb (138.8 kg)  09/25/23 (!) 307 lb (139.3 kg)    Physical Exam Vitals reviewed.  Constitutional:      General: She is not in acute distress.    Appearance: She is obese. She is not ill-appearing, toxic-appearing or diaphoretic.  HENT:     Mouth/Throat:     Mouth: Mucous membranes are moist.  Eyes:     General: No scleral icterus.    Conjunctiva/sclera: Conjunctivae normal.  Cardiovascular:     Rate and Rhythm: Normal rate and regular rhythm.     Heart sounds: Normal heart sounds, S1 normal and S2 normal. No murmur heard.    No gallop.     Comments: EKG- SR with SA, 62 bpm Moderate LVH No Q waves or acute ST/T wave changes Unchanged Pulmonary:     Effort: Pulmonary effort is normal.     Breath sounds: No stridor. No wheezing, rhonchi or rales.  Abdominal:     General: Abdomen is protuberant. Bowel sounds are normal. There is no distension.     Palpations: Abdomen is soft. There is no hepatomegaly, splenomegaly or mass.     Tenderness: There is no abdominal tenderness. There is no guarding or rebound.  Musculoskeletal:     Cervical back: Neck supple.     Right lower leg: 1+ Pitting Edema present.     Left lower leg: 1+ Pitting Edema present.  Skin:    General: Skin is warm and dry.     Coloration: Skin is not pale.  Neurological:     General: No focal deficit present.     Mental Status: She is alert. Mental status is at baseline.  Psychiatric:        Mood and Affect: Mood normal.        Behavior: Behavior normal.     Lab Results  Component Value Date   WBC 7.5 12/28/2023   HGB 14.8 12/28/2023   HCT 46.5 (H) 12/28/2023   PLT 215.0 12/28/2023   GLUCOSE 77 12/28/2023   CHOL 182 12/28/2023   TRIG 84.0 12/28/2023   HDL 74.30  12/28/2023   LDLDIRECT 134.8 08/27/2010   LDLCALC 91 12/28/2023   ALT 15 12/28/2023   AST 20 12/28/2023   NA 140 12/28/2023   K 3.7 12/28/2023   CL 103 12/28/2023   CREATININE 1.32 (H) 12/28/2023   BUN 22 12/28/2023   CO2 29 12/28/2023   TSH 2.97 12/28/2023   HGBA1C 7.2 (H) 12/28/2023   MICROALBUR 145.3 (H) 12/28/2023    No results found.  Assessment & Plan:  Hyperlipidemia with target LDL less than 100 -     Lipid panel; Future -     TSH; Future -     Atorvastatin Calcium; Take 1 tablet (40 mg total) by mouth daily.  Dispense: 90 tablet; Refill: 1 -     AMB Referral [facility] Care Management  Type II diabetes mellitus with manifestations (HCC) -     Urinalysis, Routine w reflex microscopic; Future -     Microalbumin / creatinine urine ratio; Future -     Basic metabolic panel; Future -     Hemoglobin A1c; Future -     HM Diabetes Foot Exam -     Chauncey Mann; Take 1 tablet (20 mg total) by mouth daily. [employer] Patient Assistance Medication  Dispense: 90 tablet; Refill: 0 -     AMB Referral [facility]BCI Care Management  Malignant hypertension -     Urinalysis, Routine w reflex microscopic; Future -     Basic metabolic panel; Future -     CBC with Differential/Platelet; Future -     Hepatic function panel; Future -     Carvedilol; Take 1 tablet (12.5 mg total) by mouth 2 (two) times daily with a meal.  Dispense: 180 tablet; Refill: 0 -     AMB Referral [facility] Care Management  Essential hypertension -     EKG 12-Lead  Morbid obesity (HCC)  Stage 3a chronic kidney disease (HCC) -     Urinalysis, Routine w reflex microscopic; Future -     Microalbumin / creatinine urine ratio; Future -     Basic metabolic panel; Future -     VITAMIN D 25 Hydroxy (Vit-D Deficiency, Fractures); Future -     Chauncey Mann; Take 1 tablet (20 mg total) by mouth daily. [employer] Patient Assistance Medication  Dispense: 90 tablet; Refill: 0 -     AMB Referral [facility]BCI Care Management  LVH (left ventricular hypertrophy) due  to hypertensive disease, with heart failure (HCC)  Vitamin D deficiency -     VITAMIN D 25 Hydroxy (Vit-D Deficiency, Fractures); Future  Atherosclerosis of native artery of right lower extremity with intermittent claudication (HCC) -     Atorvastatin Calcium; Take 1 tablet (40 mg total) by mouth daily.  Dispense: 90 tablet; Refill: 1  Diabetic nephropathy associated with type 2 diabetes mellitus (HCC) -     Chauncey Mann; Take 1 tablet (20 mg total) by mouth daily. [employer] Patient Assistance Medication  Dispense: 90 tablet; Refill: 0  Asymptomatic microscopic hematuria -     Ambulatory referral to Urology     Follow-up: Return in about 6 months (around 06/26/2024).  Sanda Linger, MD

## 2023-12-29 ENCOUNTER — Encounter: Payer: Self-pay | Admitting: Internal Medicine

## 2023-12-29 DIAGNOSIS — R3121 Asymptomatic microscopic hematuria: Secondary | ICD-10-CM | POA: Insufficient documentation

## 2023-12-29 DIAGNOSIS — E1121 Type 2 diabetes mellitus with diabetic nephropathy: Secondary | ICD-10-CM | POA: Insufficient documentation

## 2023-12-29 LAB — URINALYSIS, ROUTINE W REFLEX MICROSCOPIC
Bilirubin Urine: NEGATIVE
Ketones, ur: NEGATIVE
Nitrite: NEGATIVE
Specific Gravity, Urine: 1.025 (ref 1.000–1.030)
Total Protein, Urine: 100 — AB
Urine Glucose: >=1000 — AB
Urobilinogen, UA: 0.2 (ref 0.0–1.0)
pH: 6 (ref 5.0–8.0)

## 2023-12-29 LAB — HEMOGLOBIN A1C: Hgb A1c MFr Bld: 7.2 % — ABNORMAL HIGH (ref 4.6–6.5)

## 2023-12-29 MED ORDER — KERENDIA 20 MG PO TABS: 1.0000 | ORAL_TABLET | Freq: Every day | ORAL | 0 refills | Status: DC

## 2023-12-29 MED ORDER — ATORVASTATIN CALCIUM 40 MG PO TABS: 40.0000 mg | ORAL_TABLET | Freq: Every day | ORAL | 1 refills | Status: DC

## 2023-12-29 MED ORDER — CARVEDILOL 12.5 MG PO TABS: 12.5000 mg | ORAL_TABLET | Freq: Two times a day (BID) | ORAL | 0 refills | Status: DC

## 2023-12-30 ENCOUNTER — Other Ambulatory Visit: Payer: Self-pay | Admitting: Internal Medicine

## 2023-12-30 DIAGNOSIS — I70211 Atherosclerosis of native arteries of extremities with intermittent claudication, right leg: Secondary | ICD-10-CM

## 2023-12-30 DIAGNOSIS — E785 Hyperlipidemia, unspecified: Secondary | ICD-10-CM

## 2023-12-30 DIAGNOSIS — I10 Essential (primary) hypertension: Secondary | ICD-10-CM

## 2023-12-30 MED ORDER — INDAPAMIDE 1.25 MG PO TABS: 1.2500 mg | ORAL_TABLET | Freq: Every day | ORAL | 0 refills | Status: DC

## 2023-12-30 MED ORDER — TIRZEPATIDE 2.5 MG/0.5ML ~~LOC~~ SOAJ: 2.5000 mg | SUBCUTANEOUS | 0 refills | Status: DC

## 2023-12-30 NOTE — Assessment & Plan Note (Signed)
 Exam completed, labs reviewed, vaccines reviewed, no cancer screenings indicated, pt ed material was given.

## 2024-01-01 ENCOUNTER — Encounter: Payer: Self-pay | Admitting: Pharmacist

## 2024-01-01 NOTE — Progress Notes (Signed)
Received [employer] PAP app signed by patient and income documents. Finished completing app and received PCP signature. Faxed completed app to [facility]. Uploading copy to chart.  Arbutus Leas, PharmD, BCPS, CPP Clinical Pharmacist Practitioner [facility] [phone]

## 2024-01-03 NOTE — Telephone Encounter (Signed)
Copied from CRM [id number]. Topic: Clinical - Medication Refill >> Jan 03, 2024  2:54 PM Alcus Dad wrote: Most Recent Primary Care Visit:  Provider: Etta Grandchild  Department: [facility]  Visit Type: OFFICE VISIT  Date: 12/28/2023  Courney call from [facility] wanting an update on medicine. She stated that this will be the last attempt and file will be open for 25 more days so the patient can be able to get meds. Can be reached @ [phone] Fax: [phone]  Medication: aerendia  Has the patient contacted their pharmacy? Yes (Agent: If no, request that the patient contact the pharmacy for the refill. If patient does not wish to contact the pharmacy document the reason why and proceed with request.) (Agent: If yes, when and what did the pharmacy advise?)  Is this the correct pharmacy for this prescription? Yes If no, delete pharmacy and type the correct one.  This is the patient's preferred pharmacy:  [facility] [address] [place] - [address] [place] [address] Phone: [phone] Fax: [phone]  [facility] - [place], [place] - [address] [place] [address] Phone: [phone] Fax: [phone]  [facility] [address] [place] - [address] [place] [address] Phone: [phone] Fax: [phone]   Has the prescription been filled recently? No  Is the patient out of the medication? Yes  Has the patient been seen for an appointment in the last year OR does the patient have an upcoming appointment? Yes  Can we respond through MyChart? No  Agent: Please be advised that Rx refills may take up to 3 business days. We ask that you follow-up with your pharmacy.

## 2024-01-04 ENCOUNTER — Telehealth: Payer: Self-pay | Admitting: Internal Medicine

## 2024-01-04 NOTE — Telephone Encounter (Signed)
Copied from CRM [id number]. Topic: Clinical - Prescription Issue >> Jan 04, 2024 12:45 PM Sonny Dandy B wrote: Reason for CRM:  pt called stating . She needs a new prescription for [facility]  Finerenone (KERENDIA) 20 MG TABS. Please call [phone]

## 2024-01-04 NOTE — Telephone Encounter (Signed)
Copied from CRM [id number]. Topic: General - Other >> Jan 04, 2024  1:11 PM [place] A wrote: Reason for CRM: Patient called to give fax number for [employer] [place] Patient [employer] [phone]

## 2024-01-05 ENCOUNTER — Telehealth: Payer: Self-pay | Admitting: Internal Medicine

## 2024-01-05 NOTE — Telephone Encounter (Signed)
Copied from CRM [id number]. Topic: Clinical - Prescription Issue >> Jan 05, 2024 10:28 AM Holly [name] wrote: Reason for CRM: Patient states the pharmacy is trying to charged her $100 for [name] medication (does not know what it is). Also states some of her medications has been cancelled, one of them is Metformin. Patient requesting [name] call back.

## 2024-01-08 ENCOUNTER — Other Ambulatory Visit: Payer: Self-pay

## 2024-01-08 DIAGNOSIS — N1831 Chronic kidney disease, stage 3a: Secondary | ICD-10-CM

## 2024-01-08 DIAGNOSIS — I10 Essential (primary) hypertension: Secondary | ICD-10-CM

## 2024-01-08 DIAGNOSIS — E118 Type 2 diabetes mellitus with unspecified complications: Secondary | ICD-10-CM

## 2024-01-08 DIAGNOSIS — E1121 Type 2 diabetes mellitus with diabetic nephropathy: Secondary | ICD-10-CM

## 2024-01-08 MED ORDER — KERENDIA 20 MG PO TABS: 1.0000 | ORAL_TABLET | Freq: Every day | ORAL | 1 refills | Status: DC

## 2024-01-08 MED ORDER — INDAPAMIDE 1.25 MG PO TABS
1.2500 mg | ORAL_TABLET | Freq: Every day | ORAL | 1 refills | Status: DC
Start: 2024-01-08 — End: 2024-09-16

## 2024-01-08 NOTE — Telephone Encounter (Signed)
 New RX has been faxed. Patient has been made aware.

## 2024-01-08 NOTE — Telephone Encounter (Signed)
 Called patient and discussed her medication with her and she gave me a verbal understanding.

## 2024-01-11 ENCOUNTER — Telehealth: Payer: Self-pay

## 2024-01-11 NOTE — Telephone Encounter (Signed)
 PAP: Patient assistance application for [place] has been approved by PAP Companies: [employer] from 01/02/2024 to 11/13/2024. Medication should be delivered to PAP Delivery: Home. For further shipping updates, please contact [employer] ([employer]) at [phone]. Patient ID is: NO ID   PLEASE BE ADVISED

## 2024-01-12 ENCOUNTER — Telehealth: Payer: Self-pay

## 2024-01-12 NOTE — Progress Notes (Signed)
 Care Guide Pharmacy Note  01/12/2024 Name: [name] MRN: [id number] DOB: Aug 22, 1941  Referred By: Etta Grandchild, MD Reason for referral: Care Coordination (Outreach to schedule with Pharm d )   Holly [name] is a 83 y.o. year old female who is a primary care patient of Etta Grandchild, MD.  [name] was referred to the pharmacist for assistance related to: HTN, HLD, and DMII  Successful contact was made with the patient to discuss pharmacy services including being ready for the pharmacist to call at least 5 minutes before the scheduled appointment time and to have medication bottles and any blood pressure readings ready for review. The patient agreed to meet with the pharmacist via telephone visit on (date/time).01/19/2024  Penne Lash , RMA     [place]  [facility] Guide  Direct Dial: [phone]  Website: [email or web address]

## 2024-01-12 NOTE — Progress Notes (Signed)
 Care Guide Pharmacy Note  01/12/2024 Name: Holly [name] MRN: [id number] DOB: [birth year]  Referred By: Etta Grandchild, MD Reason for referral: Care Coordination (Outreach to schedule with Pharm d )   Holly [name] is a 83 y.o. year old female who is a primary care patient of Etta Grandchild, MD.  Clement Husbands was referred to the pharmacist for assistance related to: HTN, HLD, and DMII  An unsuccessful telephone outreach was attempted today to contact the patient who was referred to the pharmacy team for assistance with medication management. Additional attempts will be made to contact the patient.  Penne Lash , RMA     [facility]  [facility] Guide  Direct Dial: [phone]  Website: [email or web address]

## 2024-01-16 DIAGNOSIS — R350 Frequency of micturition: Secondary | ICD-10-CM | POA: Diagnosis not present

## 2024-01-16 DIAGNOSIS — R3121 Asymptomatic microscopic hematuria: Secondary | ICD-10-CM | POA: Diagnosis not present

## 2024-01-16 DIAGNOSIS — N3941 Urge incontinence: Secondary | ICD-10-CM | POA: Diagnosis not present

## 2024-01-17 ENCOUNTER — Other Ambulatory Visit: Payer: Self-pay | Admitting: Internal Medicine

## 2024-01-17 DIAGNOSIS — I10 Essential (primary) hypertension: Secondary | ICD-10-CM

## 2024-01-19 ENCOUNTER — Other Ambulatory Visit: Payer: Medicare PPO | Admitting: Pharmacist

## 2024-01-19 DIAGNOSIS — E785 Hyperlipidemia, unspecified: Secondary | ICD-10-CM

## 2024-01-19 DIAGNOSIS — E119 Type 2 diabetes mellitus without complications: Secondary | ICD-10-CM

## 2024-01-19 DIAGNOSIS — I70211 Atherosclerosis of native arteries of extremities with intermittent claudication, right leg: Secondary | ICD-10-CM

## 2024-01-19 DIAGNOSIS — Z794 Long term (current) use of insulin: Secondary | ICD-10-CM

## 2024-01-19 DIAGNOSIS — I10 Essential (primary) hypertension: Secondary | ICD-10-CM

## 2024-01-19 MED ORDER — LANTUS SOLOSTAR 100 UNIT/ML ~~LOC~~ SOPN: 40.0000 [IU] | PEN_INJECTOR | Freq: Every day | SUBCUTANEOUS | Status: DC

## 2024-01-19 MED ORDER — TIRZEPATIDE 5 MG/0.5ML ~~LOC~~ SOAJ: 5.0000 mg | SUBCUTANEOUS | 1 refills | Status: DC

## 2024-01-19 MED ORDER — CARVEDILOL 12.5 MG PO TABS: 12.5000 mg | ORAL_TABLET | Freq: Two times a day (BID) | ORAL | 0 refills | Status: DC

## 2024-01-19 MED ORDER — ATORVASTATIN CALCIUM 40 MG PO TABS: 40.0000 mg | ORAL_TABLET | Freq: Every day | ORAL | 1 refills | Status: DC

## 2024-01-19 NOTE — Patient Instructions (Signed)
 It was a pleasure speaking with you today!  Decrease Lantus to 40 units once daily. Increase Mounjaro to 5 mg once you finish 4 weeks of the 2.5 mg dose. The increased dose has been sent to you pharmacy.  I sent refills of carvedilol and atorvastatin to [facility].  Feel free to call with any questions or concerns!  Arbutus Leas, PharmD, BCPS, CPP Clinical Pharmacist Practitioner [place] [facility] [facility] [phone]

## 2024-01-19 NOTE — Progress Notes (Signed)
01/19/2024 Name: Holly [name] MRN: [id number] DOB: 09/10/1941  Chief Complaint  Patient presents with   Diabetes   Hypertension   Hyperlipidemia   Medication Management    Holly [name] is a 83 y.o. year old female who presented for a telephone visit.   They were referred to the pharmacist by their PCP for assistance in managing diabetes, hypertension, hyperlipidemia, and CKD .   Subjective:  Care Team: Primary Care Provider: Etta Grandchild, MD ; Next Scheduled Visit: not scheduled  Medication Access/Adherence  Current Pharmacy:  [facility] - [place], [place] - [address] [place] [address] Phone: [phone] Fax: [phone]  [facility] [address] [place], [place] - [address] [place] [address] Phone: [phone] Fax: [phone]   Patient reports affordability concerns with their medications: Yes  Patient reports access/transportation concerns to their pharmacy: Yes  Patient reports adherence concerns with their medications:  No    Diabetes:  Current medications: Jardiance 25 mg daily, Lantus 60 units daily, Mounjaro 2.5 mg weekly (started 2 weeks ago) Medications tried in the past: metformin stopped 2 weeks ago  Current glucose readings: 99-120 - 3 hours after eating  Using Accu-chek meter; testing 1-2 times daily   Patient denies hypoglycemic s/sx including dizziness, shakiness, sweating.   Current medication access support: Jardiance and Lantus through PAP - renrolled for 2025  Hypertension:  Current medications: Olmesartan 20 mg daily, indapamide 1.25 mg daily, carvedilol 12.5 mg twice daily Medications previously tried: Oceanographer, amlodipine (edema), HCTZ  Patient has a validated, automated, upper arm home BP cuff Current blood pressure readings: none recent, notes BP at Urology appt was 136/72  CKD: Current medication: olmesartan 20 mg daily,  Jardiance 25 mg daily, Kerendia 20 mg daily  Med access support: Kerendia through PAP - reenrolled for 2025  Hyperlipidemia/ASCVD Risk Reduction  Current lipid lowering medications: atorvastatin 40 mg daily Medications tried in the past: simvastatin  Antiplatelet regimen: none    Objective:  Lab Results  Component Value Date   HGBA1C 7.2 (H) 12/28/2023    Lab Results  Component Value Date   CREATININE 1.32 (H) 12/28/2023   BUN 22 12/28/2023   NA 140 12/28/2023   K 3.7 12/28/2023   CL 103 12/28/2023   CO2 29 12/28/2023    Lab Results  Component Value Date   CHOL 182 12/28/2023   HDL 74.30 12/28/2023   LDLCALC 91 12/28/2023   LDLDIRECT 134.8 08/27/2010   TRIG 84.0 12/28/2023   CHOLHDL 2 12/28/2023    Medications Reviewed Today     Reviewed by Bonita Quin, RPH (Pharmacist) on 01/19/24 at 1132  Med List Status: <None>   Medication Order Taking? Sig Documenting Provider Last Dose Status Informant  Accu-Chek FastClix Lancets MISC [id number]  USE THREE TIMES DAILY Etta Grandchild, MD  Active   ACCU-CHEK GUIDE test strip [id number]  USE THREE TIMES DAILY Etta Grandchild, MD  Active   acetaminophen (TYLENOL) 500 MG tablet [id number]  Take 1,000 mg by mouth every 6 (six) hours as needed for mild pain. [provider]  Active Self  atorvastatin (LIPITOR) 40 MG tablet [id number] Yes Take 1 tablet (40 mg total) by mouth daily. Etta Grandchild, MD Taking Active   Blood Glucose Monitoring Suppl (ACCU-CHEK AVIVA PLUS) w/Device KIT [id number]  Use to check blood sugars daily Dx E11.9 Etta Grandchild, MD  Active   carvedilol (COREG)  12.5 MG tablet [id number] Yes Take 1 tablet (12.5 mg total) by mouth 2 (two) times daily with a meal. Etta Grandchild, MD Taking Active   cetirizine (ZYRTEC) 10 MG tablet [id number] Yes Take 10 mg by mouth daily. [provider] Taking Active Self           Med Note Gretchen Short [facility] D   Tue May 28, 2019 11:41 AM) Takes as needed   Cholecalciferol 2000 units TABS [id number] Yes Take 1 tablet (2,000 Units total) by mouth daily. Etta Grandchild, MD Taking Active            Med Note Grayce Sessions Jun 19, 2019  3:23 PM)    Cyanocobalamin (VITAMIN B-12 CR PO) [id number] No Take 1 tablet by mouth daily.  Patient not taking: Reported on 01/19/2024   [provider] Not Taking Active Self  empagliflozin (JARDIANCE) 25 MG TABS tablet [id number] Yes TAKE 1 TABLET EVERY DAY BEFORE BREAKFAST Etta Grandchild, MD Taking Active   Finerenone [facility]) 20 MG TABS [id number] Yes Take 1 tablet (20 mg total) by mouth daily. [employer] Patient Assistance Medication Etta Grandchild, MD Taking Active   Glucagon (GVOKE HYPOPEN 2-PACK) 1 MG/0.2ML Ivory Broad [id number]  Inject 1 Act into the skin daily as needed. Etta Grandchild, MD  Active   indapamide (LOZOL) 1.25 MG tablet [id number] Yes Take 1 tablet (1.25 mg total) by mouth daily. Etta Grandchild, MD Taking Active   insulin glargine (LANTUS SOLOSTAR) 100 UNIT/ML Solostar Pen [id number] Yes Inject 60 Units into the skin daily. Etta Grandchild, MD Taking Active   Insulin Pen Needle (DROPLET PEN NEEDLES) 32G X 6 MM MISC [id number]  USE DAILY Etta Grandchild, MD  Active   olmesartan (BENICAR) 20 MG tablet [id number] Yes TAKE 1 TABLET EVERY DAY Etta Grandchild, MD Taking Active   tirzepatide [facility]) 2.5 MG/0.5ML Pen [id number] Yes Inject 2.5 mg into the skin once a week. Etta Grandchild, MD Taking Active   triamcinolone cream (KENALOG) 0.1 % [id number]  Apply 1 Application topically 2 (two) times daily. [name] F, FNP  Active               Assessment/Plan:   Diabetes: - Currently controlled, A1c goal <7.5%. Post prandial BG are below goal - Reviewed goal A1c, goal fasting, and goal 2 hour post prandial glucose - Recommend to decrease Lantus to 40 units daily and increase Mounjaro to 5 mg weekly after 4 weeks on 2.5 mg - Reviewed cost of Mounjaro - initial fill was $300+ for drug  deductible, next fill should be $47  - Recommend to check glucose 1-2 x per day    Hypertension: - Currently uncontrolled, BP goal <130/80 - Reviewed appropriate blood pressure monitoring technique and reviewed goal blood pressure. Recommended to check home blood pressure and heart rate daily - Recommend to monitor BP at home to see if controlled per home readings. BP may decrease as she loses weight after Mounjaro restart   CKD - Currently uncontrolled. Goal MACR <30. Last MACR >1000 - Continue current regimen   Hyperlipidemia/ASCVD Risk Reduction: - Currently uncontrolled. LDL goal <55 due to T2DM + CAD - Fill history appears late however pt states she is taking atorvastatin daily - Recommend to continue current regimen. Consider increasing to 80 mg    Follow Up Plan: 3/24  Arbutus Leas, PharmD, BCPS, CPP Clinical Pharmacist Practitioner [place] [facility] at [facility]  [phone]    

## 2024-02-02 DIAGNOSIS — R3121 Asymptomatic microscopic hematuria: Secondary | ICD-10-CM | POA: Diagnosis not present

## 2024-02-02 DIAGNOSIS — N3090 Cystitis, unspecified without hematuria: Secondary | ICD-10-CM | POA: Diagnosis not present

## 2024-02-02 DIAGNOSIS — N289 Disorder of kidney and ureter, unspecified: Secondary | ICD-10-CM | POA: Diagnosis not present

## 2024-02-04 NOTE — Progress Notes (Unsigned)
 02/05/2024 Name: Holly [name] MRN: [id number] DOB: 1941/11/07  Chief Complaint  Patient presents with   Hyperlipidemia   Hypertension   Diabetes   Constipation    Holly [name] is a 83 y.o. year old female who presented for a face to face visit.   They were referred to the pharmacist by their PCP for assistance in managing diabetes, hypertension, hyperlipidemia, and CKD .   Subjective:  Care Team: Primary Care Provider: Etta Grandchild, MD ; Next Scheduled Visit: not scheduled  Medication Access/Adherence  Current Pharmacy:  [facility] - [place], [place] - [address] [place] [address] Phone: [phone] Fax: [phone]  [facility] [address] [place], [place] - [address] [place] [address] Phone: [phone] Fax: [phone]   Patient reports affordability concerns with their medications: Yes  Patient reports access/transportation concerns to their pharmacy: Yes  Patient reports adherence concerns with their medications:  No    Diabetes: Current medications: Jardiance 25 mg daily, Lantus 40 units daily, Mounjaro 5 mg weekly (has not taken for 2 weeks due to constipation)  Medications tried in the past: metformin stopped end of Feb  Glucose readings: 120s ~1-3h after eating, fasting BG before breakfast today was 145 Using Accu-chek meter; testing 1-2 times daily  Patient denies hypoglycemic s/sx including dizziness, shakiness, sweating.  Patient reports constipation once started taking mounjaro 5mg ; no changes to diet since dose change to 5mg  (reports trying prune juice and reports drinking plenty of water)  Current medication access support: Jardiance and Lantus through PAP - renrolled for 2025  Hypertension: Current medications: Olmesartan 20 mg daily, indapamide 1.25 mg daily, carvedilol 12.5 mg twice daily Medications previously tried: Oceanographer, amlodipine  (edema), HCTZ  Patient has a validated, automated, upper arm home BP cuff Current blood pressure readings: OV 127/73, last home BP 150/58  CKD: Current medications: olmesartan 20 mg daily, Jardiance 25 mg daily, Kerendia 20 mg daily  Med access support: Kerendia through PAP - reenrolled for 2025  Hyperlipidemia/ASCVD Risk Reduction Current lipid lowering medications: atorvastatin 40 mg daily Medications tried in the past: simvastatin  Patient denies muscle pain/weakness and is willing to increase dosage for LDL control.  Antiplatelet regimen: none   Objective:  Lab Results  Component Value Date   HGBA1C 7.2 (H) 12/28/2023    Lab Results  Component Value Date   CREATININE 1.32 (H) 12/28/2023   BUN 22 12/28/2023   NA 140 12/28/2023   K 3.7 12/28/2023   CL 103 12/28/2023   CO2 29 12/28/2023    Lab Results  Component Value Date   CHOL 182 12/28/2023   HDL 74.30 12/28/2023   LDLCALC 91 12/28/2023   LDLDIRECT 134.8 08/27/2010   TRIG 84.0 12/28/2023   CHOLHDL 2 12/28/2023    Medications Reviewed Today     Reviewed by Verdene Rio, RPH (Pharmacist) on 02/05/24 at 1141  Med List Status: <None>   Medication Order Taking? Sig Documenting Provider Last Dose Status Informant  Accu-Chek FastClix Lancets MISC [id number]  USE THREE TIMES DAILY Etta Grandchild, MD  Active   ACCU-CHEK GUIDE test strip [id number]  USE THREE TIMES DAILY Etta Grandchild, MD  Active   acetaminophen (TYLENOL) 500 MG tablet [id number]  Take 1,000 mg by mouth every 6 (six) hours as needed for mild pain. [provider]  Active Self  atorvastatin (LIPITOR) 40 MG tablet [id number] Yes Take 1 tablet (40  mg total) by mouth daily. Etta Grandchild, MD Taking Active   Blood Glucose Monitoring Suppl (ACCU-CHEK AVIVA PLUS) w/Device KIT [id number]  Use to check blood sugars daily Dx E11.9 Etta Grandchild, MD  Active   carvedilol (COREG) 12.5 MG tablet [id number] Yes Take 1 tablet (12.5 mg total) by mouth  2 (two) times daily with a meal. Etta Grandchild, MD Taking Active   cetirizine (ZYRTEC) 10 MG tablet [id number]  Take 10 mg by mouth daily. [provider]  Active Self           Med Note Gretchen Short, [facility] D   Tue May 28, 2019 11:41 AM) Takes as needed  Cholecalciferol 2000 units TABS [id number]  Take 1 tablet (2,000 Units total) by mouth daily. Etta Grandchild, MD  Active            Med Note Grayce Sessions Jun 19, 2019  3:23 PM)    Cyanocobalamin (VITAMIN B-12 CR PO) [id number]  Take 1 tablet by mouth daily.  Patient not taking: Reported on 01/19/2024   [provider]  Active Self  empagliflozin (JARDIANCE) 25 MG TABS tablet [id number] Yes TAKE 1 TABLET EVERY DAY BEFORE BREAKFAST Etta Grandchild, MD Taking Active   Finerenone [facility]) 20 MG TABS [id number] Yes Take 1 tablet (20 mg total) by mouth daily. [employer] Patient Assistance Medication Etta Grandchild, MD Taking Active   Glucagon (GVOKE HYPOPEN 2-PACK) 1 MG/0.2ML Ivory Broad [id number]  Inject 1 Act into the skin daily as needed. Etta Grandchild, MD  Active   indapamide (LOZOL) 1.25 MG tablet [id number] Yes Take 1 tablet (1.25 mg total) by mouth daily. Etta Grandchild, MD Taking Active   insulin glargine (LANTUS SOLOSTAR) 100 UNIT/ML Solostar Pen [id number] Yes Inject 40 Units into the skin daily. Gets through PAP Etta Grandchild, MD Taking Active   Insulin Pen Needle (DROPLET PEN NEEDLES) 32G X 6 MM MISC [id number]  USE DAILY Etta Grandchild, MD  Active   olmesartan (BENICAR) 20 MG tablet [id number] Yes TAKE 1 TABLET EVERY DAY Etta Grandchild, MD Taking Active   tirzepatide [facility]) 5 MG/0.5ML Pen [id number] Yes Inject 5 mg into the skin once a week. Etta Grandchild, MD Taking Active   triamcinolone cream (KENALOG) 0.1 % [id number]  Apply 1 Application topically 2 (two) times daily. [name] F, FNP  Active               Assessment/Plan:    Diabetes: - Currently controlled, A1c goal <7.5%. Post prandial BG are below  goal - Reviewed goal A1c, goal fasting, and goal 2 hour post prandial glucose - Recommend to decrease Mounjaro to 2.5 mg weekly due to constipation; will consider increasing back to 5mg  once constipation improved - Counseled on taking OTC Docusate 50mg  daily and Miralax 17g daily for constipation and if causes excess diarrhea, can take every other day until constipation has resolved - Recommend to check glucose 1-2 x per day - Sent refills for test strips and needles     Hypertension: - Currently uncontrolled, BP goal <130/80 - Reviewed appropriate blood pressure monitoring technique and reviewed goal blood pressure. Recommended to check home blood pressure and heart rate daily - Recommend to monitor BP at home to see if controlled per home readings. BP may decrease as she loses weight after Mounjaro restart  - Will send refill for Olmesartan  CKD - Currently uncontrolled. Goal MACR <30. Last MACR >1000 - Continue  current regimen   Hyperlipidemia/ASCVD Risk Reduction: - Currently uncontrolled. LDL goal <55 due to T2DM + CAD - Will increase atorvastatin to 80mg , instructed to take 40mg  twice daily until prescription runs out. Will send atorvatatin 80 mg rx  Follow up: 4/14  Verdene Rio, PharmD PGY1 Pharmacy Resident   Arbutus Leas, PharmD, BCPS, CPP Clinical Pharmacist Practitioner [place] [facility] [phone]

## 2024-02-05 ENCOUNTER — Ambulatory Visit (INDEPENDENT_AMBULATORY_CARE_PROVIDER_SITE_OTHER): Admitting: Pharmacist

## 2024-02-05 DIAGNOSIS — I70211 Atherosclerosis of native arteries of extremities with intermittent claudication, right leg: Secondary | ICD-10-CM

## 2024-02-05 DIAGNOSIS — E785 Hyperlipidemia, unspecified: Secondary | ICD-10-CM | POA: Diagnosis not present

## 2024-02-05 DIAGNOSIS — E118 Type 2 diabetes mellitus with unspecified complications: Secondary | ICD-10-CM | POA: Diagnosis not present

## 2024-02-05 DIAGNOSIS — I10 Essential (primary) hypertension: Secondary | ICD-10-CM

## 2024-02-05 MED ORDER — ACCU-CHEK GUIDE TEST VI STRP: ORAL_STRIP | 3 refills | Status: AC

## 2024-02-05 MED ORDER — DROPLET PEN NEEDLES 32G X 6 MM MISC
1.0000 | Freq: Every day | 3 refills | Status: DC
Start: 2024-02-05 — End: 2024-04-09

## 2024-02-05 MED ORDER — TIRZEPATIDE 2.5 MG/0.5ML ~~LOC~~ SOAJ: 2.5000 mg | SUBCUTANEOUS | 1 refills | Status: DC

## 2024-02-05 MED ORDER — OLMESARTAN MEDOXOMIL 20 MG PO TABS: 20.0000 mg | ORAL_TABLET | Freq: Every day | ORAL | 0 refills | Status: DC

## 2024-02-05 MED ORDER — ATORVASTATIN CALCIUM 80 MG PO TABS: 80.0000 mg | ORAL_TABLET | Freq: Every day | ORAL | 1 refills | Status: DC

## 2024-02-05 NOTE — Patient Instructions (Addendum)
 It was a pleasure speaking with you today!  - Start taking 2 tablets of atorvastatin 40mg  daily until you receive a new prescripton for the 80mg  daily - I will send a refill for your Olmesartan - Take Docusate and Miralax daily for constipation; can take every other day if causes excessive diarrhea - In the mean time, I will decrease your Mounjaro to 2.5 until your constipation is resolved; we will then consider increasing the dose back to 5mg     Feel free to call with any questions or concerns!  Verdene Rio, PharmD PGY1 Pharmacy Resident   Arbutus Leas, PharmD, BCPS, CPP Clinical Pharmacist Practitioner [place] [facility] at [facility] [phone]

## 2024-02-15 DIAGNOSIS — R350 Frequency of micturition: Secondary | ICD-10-CM | POA: Diagnosis not present

## 2024-02-15 DIAGNOSIS — R3121 Asymptomatic microscopic hematuria: Secondary | ICD-10-CM | POA: Diagnosis not present

## 2024-02-15 DIAGNOSIS — N3941 Urge incontinence: Secondary | ICD-10-CM | POA: Diagnosis not present

## 2024-02-21 ENCOUNTER — Other Ambulatory Visit: Payer: Self-pay | Admitting: Urology

## 2024-02-21 DIAGNOSIS — D219 Benign neoplasm of connective and other soft tissue, unspecified: Secondary | ICD-10-CM

## 2024-02-26 ENCOUNTER — Ambulatory Visit (INDEPENDENT_AMBULATORY_CARE_PROVIDER_SITE_OTHER): Payer: Medicare PPO

## 2024-02-26 ENCOUNTER — Other Ambulatory Visit: Admitting: Pharmacist

## 2024-02-26 VITALS — Ht 69.5 in | Wt 309.0 lb

## 2024-02-26 DIAGNOSIS — Z0000 Encounter for general adult medical examination without abnormal findings: Secondary | ICD-10-CM | POA: Diagnosis not present

## 2024-02-26 DIAGNOSIS — Z794 Long term (current) use of insulin: Secondary | ICD-10-CM

## 2024-02-26 DIAGNOSIS — E118 Type 2 diabetes mellitus with unspecified complications: Secondary | ICD-10-CM

## 2024-02-26 NOTE — Patient Instructions (Signed)
 It was a pleasure speaking with you today!  Change Mounjaro to 2.5 mg weekly to see if it helps with constipation. You can adjust the Miralax and stool softener as needed.  Continue current medications and monitor BG and BP at home.   Feel free to call with any questions or concerns!  Rainelle Bur, PharmD, BCPS, CPP Clinical Pharmacist Practitioner [place] [facility] at [facility] [phone]

## 2024-02-26 NOTE — Patient Instructions (Addendum)
 Holly [name] , Thank you for taking time to come for your Medicare Wellness Visit. I appreciate your ongoing commitment to your health goals. Please review the following plan we discussed and let me know if I can assist you in the future.   Referrals/Orders/Follow-Ups/Clinician Recommendations: Aim for 30 minutes of exercise or brisk walking, 6-8 glasses of water, and 5 servings of fruits and vegetables each day.   This is a list of the screening recommended for you and due dates:  Health Maintenance  Topic Date Due   COVID-19 Vaccine (6 - 2024-25 season) 07/16/2023   Zoster (Shingles) Vaccine (1 of 2) 03/26/2024*   Flu Shot  06/14/2024   Hemoglobin A1C  06/26/2024   Eye exam for diabetics  09/26/2024   Yearly kidney function blood test for diabetes  12/27/2024   Yearly kidney health urinalysis for diabetes  12/27/2024   Complete foot exam   12/27/2024   Medicare Annual Wellness Visit  02/25/2025   DTaP/Tdap/Td vaccine (3 - Td or Tdap) 09/25/2028   Pneumonia Vaccine  Completed   DEXA scan (bone density measurement)  Completed   HPV Vaccine  Aged Out   Meningitis B Vaccine  Aged Out   Cologuard (Stool DNA test)  Discontinued  *Topic was postponed. The date shown is not the original due date.    Advanced directives: (Provided) Advance directive discussed with you today. I have provided a copy for you to complete at home and have notarized. Once this is complete, please bring a copy in to our office so we can scan it into your chart.   Next Medicare Annual Wellness Visit scheduled for next year: Yes

## 2024-02-26 NOTE — Progress Notes (Signed)
 02/26/2024 Name: Holly [name] MRN: [id number] DOB: [birth year]  Chief Complaint  Patient presents with   Diabetes   Medication Management    Holly [name] is a 83 y.o. year old female who presented for a telephone visit.   They were referred to the pharmacist by their PCP for assistance in managing diabetes, hypertension, hyperlipidemia, and CKD .   Subjective:  Care Team: Primary Care Provider: Etta Grandchild, MD ; Next Scheduled Visit: not scheduled  Medication Access/Adherence  Current Pharmacy:  [facility] - [place], [place] - [address] [place] [address] Phone: [phone] Fax: [phone]  [facility] [address] [place], [place] - [address] [place] [address] Phone: [phone] Fax: [phone]   Patient reports affordability concerns with their medications: Yes  Patient reports access/transportation concerns to their pharmacy: Yes  Patient reports adherence concerns with their medications:  No    Diabetes: Current medications: Jardiance 25 mg daily, Lantus 40 units daily, Mounjaro 5 mg weekly (just finished 5 mg this week, going to reduce to 2.5 mg next week to see if helps with constipation)  Medications tried in the past: metformin stopped end of Feb  Glucose readings: no change from previous Using Accu-chek meter; testing 1-2 times daily  Patient denies hypoglycemic s/sx including dizziness, shakiness, sweating.  Patient reports constipation once started taking mounjaro 5mg ; no changes to diet since dose change to 5mg  (reports trying prune juice and reports drinking plenty of water) - constipation has improved with use of Miralax and stool softener  Current medication access support: Jardiance and Lantus through PAP - renrolled for 2025  Hypertension: Current medications: Olmesartan 20 mg daily, indapamide 1.25 mg daily, carvedilol 12.5 mg twice  daily Medications previously tried: Oceanographer, amlodipine (edema), HCTZ  Patient has a validated, automated, upper arm home BP cuff Current blood pressure readings: n/a  CKD: Current medications: olmesartan 20 mg daily, Jardiance 25 mg daily, Kerendia 20 mg daily  Med access support: Kerendia through PAP - reenrolled for 2025  Hyperlipidemia/ASCVD Risk Reduction Current lipid lowering medications: atorvastatin 40 mg 2 tablets daily Medications tried in the past: simvastatin  Patient denies muscle pain/weakness.  Antiplatelet regimen: none   Objective:  Lab Results  Component Value Date   HGBA1C 7.2 (H) 12/28/2023    Lab Results  Component Value Date   CREATININE 1.32 (H) 12/28/2023   BUN 22 12/28/2023   NA 140 12/28/2023   K 3.7 12/28/2023   CL 103 12/28/2023   CO2 29 12/28/2023    Lab Results  Component Value Date   CHOL 182 12/28/2023   HDL 74.30 12/28/2023   LDLCALC 91 12/28/2023   LDLDIRECT 134.8 08/27/2010   TRIG 84.0 12/28/2023   CHOLHDL 2 12/28/2023    Medications Reviewed Today     Reviewed by Holly [name], RPH (Pharmacist) on 02/26/24 at 1120  Med List Status: <None>   Medication Order Taking? Sig Documenting Provider Last Dose Status Informant  Accu-Chek FastClix Lancets MISC [id number]  USE THREE TIMES DAILY Holly Grandchild, MD  Active   acetaminophen (TYLENOL) 500 MG tablet [id number]  Take 1,000 mg by mouth every 6 (six) hours as needed for mild pain. [provider]  Active Self  atorvastatin (LIPITOR) 80 MG tablet [id number] Yes Take 1 tablet (80 mg total) by mouth daily. Holly Grandchild, MD Taking Active   Blood Glucose Monitoring Suppl (ACCU-CHEK AVIVA PLUS) w/Device  KIT [id number]  Use to check blood sugars daily Dx E11.9 Holly Knuckles, MD  Active   carvedilol (COREG) 12.5 MG tablet [id number]  Take 1 tablet (12.5 mg total) by mouth 2 (two) times daily with a meal. Holly Knuckles, MD  Active   cetirizine (ZYRTEC) 10 MG tablet  [id number]  Take 10 mg by mouth daily. [provider]  Active Self           Med Note Aleta Hutch, [facility] D   Tue May 28, 2019 11:41 AM) Takes as needed  Cholecalciferol 2000 units TABS [id number]  Take 1 tablet (2,000 Units total) by mouth daily. Holly Knuckles, MD  Active            Med Note Vandy Genera Jun 19, 2019  3:23 PM)    Cyanocobalamin (VITAMIN B-12 CR PO) [id number]  Take 1 tablet by mouth daily. [provider]  Active Self  docusate sodium (COLACE) 50 MG capsule [id number] Yes Take 50 mg by mouth daily. [provider] Taking Active   empagliflozin (JARDIANCE) 25 MG TABS tablet [id number] Yes TAKE 1 TABLET EVERY DAY BEFORE BREAKFAST Holly Knuckles, MD Taking Active   Finerenone [facility]) 20 MG TABS [id number]  Take 1 tablet (20 mg total) by mouth daily. [employer] Patient Assistance Medication Holly Knuckles, MD  Active   Glucagon (GVOKE HYPOPEN 2-PACK) 1 MG/0.2ML [place] [id number]  Inject 1 Act into the skin daily as needed. Holly Knuckles, MD  Active   glucose blood (ACCU-CHEK GUIDE TEST) test strip [id number]  Check blood sugars up to TID Holly Knuckles, MD  Active   indapamide (LOZOL) 1.25 MG tablet [id number]  Take 1 tablet (1.25 mg total) by mouth daily. Holly Knuckles, MD  Active   insulin glargine (LANTUS SOLOSTAR) 100 UNIT/ML Solostar Pen [id number]  Inject 40 Units into the skin daily. Gets through PAP Holly Knuckles, MD  Active   Insulin Pen Needle (DROPLET PEN NEEDLES) 32G X 6 MM MISC [id number]  Inject 1 Needle into the skin daily. Holly Knuckles, MD  Active   olmesartan (BENICAR) 20 MG tablet [id number]  Take 1 tablet (20 mg total) by mouth daily. Holly Knuckles, MD  Active   polyethylene glycol powder [facility]) 17 GM/SCOOP powder [id number] Yes Take 119 g by mouth daily. [provider] Taking Active   tirzepatide [facility]) 2.5 MG/0.5ML Pen [id number] No Inject 2.5 mg into the skin once a week.  Patient not taking: Reported on  02/26/2024   Holly Knuckles, MD Not Taking Active            Med Note Lolly Riser, Simora Dingee R   Mon Feb 26, 2024 11:18 AM) Just finished 5 mg, going to start 2.5 mg next week  triamcinolone cream (KENALOG) 0.1 % [id number]  Apply 1 Application topically 2 (two) times daily. Hershel Los F, FNP  Active               Assessment/Plan:    Diabetes: - Currently controlled, A1c goal <7.5%.  - Reviewed goal A1c, goal fasting, and goal 2 hour post prandial glucose - Decrease Mounjaro to 2.5 mg weekly due to constipation; will consider increasing back to 5mg  once constipation improved - Counseled on continuing taking OTC Docusate 50mg  daily and Miralax 17g daily for constipation and if causes excess diarrhea, can take every other day until constipation has resolved - Recommend to check glucose 1-2 x per day  Hypertension: - Currently uncontrolled, BP goal <130/80 - Reviewed appropriate blood pressure monitoring technique and reviewed goal blood pressure. Recommended to check home blood pressure and heart rate daily - Recommend to monitor BP at home to see if controlled per home readings. BP may decrease as she loses weight after Mounjaro restart   CKD - Currently uncontrolled. Goal MACR <30. Last MACR >1000 - Continue current regimen   Hyperlipidemia/ASCVD Risk Reduction: - Currently uncontrolled. LDL goal <55 due to T2DM + CAD - Just increased atorvastatin to 80 mg daily. Await next lipid panel.  Follow up: 6/2   Rainelle Bur, PharmD, BCPS, CPP Clinical Pharmacist Practitioner [facility] [phone]

## 2024-02-26 NOTE — Progress Notes (Signed)
 Subjective:   Holly [name] is a 83 y.o. who presents for a Medicare Wellness preventive visit.  Visit Complete: Virtual I connected with  Clement Husbands on 02/26/24 by a audio enabled telemedicine application and verified that I am speaking with the correct person using two identifiers.  Patient Location: Home  Provider Location: Office/Clinic  I discussed the limitations of evaluation and management by telemedicine. The patient expressed understanding and agreed to proceed.  Vital Signs: Because this visit was a virtual/telehealth visit, some criteria may be missing or patient reported. Any vitals not documented were not able to be obtained and vitals that have been documented are patient reported.  VideoDeclined- This patient declined Librarian, academic. Therefore the visit was completed with audio only.  Persons Participating in Visit: Patient.  AWV Questionnaire: No: Patient Medicare AWV questionnaire was not completed prior to this visit.  Cardiac Risk Factors include: advanced age (>16men, >1 women);obesity (BMI >30kg/m2);diabetes mellitus;hypertension;dyslipidemia     Objective:    Today's Vitals   02/26/24 0850  Weight: (!) 309 lb (140.2 kg)  Height: 5' 9.5" (1.765 m)   Body mass index is 44.98 kg/m.     02/26/2024    8:49 AM 02/22/2023    9:09 AM 01/28/2022    4:05 PM 03/19/2021   12:46 PM 05/22/2020   11:48 AM 05/28/2019   11:39 AM 05/03/2019    2:25 PM  Advanced Directives  Does Patient Have a Medical Advance Directive? No No Yes No No No;Yes Yes  Type of Advance Directive   Living will;Healthcare Power of [employer] Power of [place];Living will   Does patient want to make changes to medical advance directive?   No - Patient declined   No - Patient declined No - Patient declined  Copy of Healthcare Power of Attorney in Chart?   No - copy requested   No - copy requested   Would patient like information on creating a  medical advance directive? Yes (MAU/Ambulatory/Procedural Areas - Information given) No - Patient declined  No - Patient declined No - Patient declined      Current Medications (verified) Outpatient Encounter Medications as of 02/26/2024  Medication Sig   Accu-Chek FastClix Lancets MISC USE THREE TIMES DAILY   acetaminophen (TYLENOL) 500 MG tablet Take 1,000 mg by mouth every 6 (six) hours as needed for mild pain.   atorvastatin (LIPITOR) 80 MG tablet Take 1 tablet (80 mg total) by mouth daily.   Blood Glucose Monitoring Suppl (ACCU-CHEK AVIVA PLUS) w/Device KIT Use to check blood sugars daily Dx E11.9   carvedilol (COREG) 12.5 MG tablet Take 1 tablet (12.5 mg total) by mouth 2 (two) times daily with a meal.   cetirizine (ZYRTEC) 10 MG tablet Take 10 mg by mouth daily.   Cholecalciferol 2000 units TABS Take 1 tablet (2,000 Units total) by mouth daily.   Cyanocobalamin (VITAMIN B-12 CR PO) Take 1 tablet by mouth daily.   empagliflozin (JARDIANCE) 25 MG TABS tablet TAKE 1 TABLET EVERY DAY BEFORE BREAKFAST   Finerenone (KERENDIA) 20 MG TABS Take 1 tablet (20 mg total) by mouth daily. [employer] Patient Assistance Medication   Glucagon (GVOKE HYPOPEN 2-PACK) 1 MG/0.2ML SOAJ Inject 1 Act into the skin daily as needed.   glucose blood (ACCU-CHEK GUIDE TEST) test strip Check blood sugars up to TID   indapamide (LOZOL) 1.25 MG tablet Take 1 tablet (1.25 mg total) by mouth daily.   insulin glargine (LANTUS SOLOSTAR) 100  UNIT/ML Solostar Pen Inject 40 Units into the skin daily. Gets through PAP   Insulin Pen Needle (DROPLET PEN NEEDLES) 32G X 6 MM MISC Inject 1 Needle into the skin daily.   olmesartan (BENICAR) 20 MG tablet Take 1 tablet (20 mg total) by mouth daily.   tirzepatide [facility]) 2.5 MG/0.5ML Pen Inject 2.5 mg into the skin once a week.   triamcinolone cream (KENALOG) 0.1 % Apply 1 Application topically 2 (two) times daily.   No facility-administered encounter medications on file as of  02/26/2024.    Allergies (verified) Nifedipine, Amlodipine, Oxycodone, and Penicillins   History: Past Medical History:  Diagnosis Date   Closed dislocation of shoulder    unspecified site   DJD (degenerative joint disease) of knee    Right knee   DM (diabetes mellitus) (HCC)    Hyperlipidemia    Hypertension    Obesity, morbid (HCC)    OSA (obstructive sleep apnea) 02/03/2017   Past Surgical History:  Procedure Laterality Date   CHOLECYSTECTOMY  1989   KNEE ARTHROSCOPY  2010   LEFT/ Dr Lajoyce Corners   TUBAL LIGATION  1979   Family History  Problem Relation Age of Onset   Heart disease Mother    Kidney disease Father    Aortic aneurysm Brother        Survived rupture   Social History   Socioeconomic History   Marital status: Divorced    Spouse name: Not on file   Number of children: 2   Years of education: Not on file   Highest education level: Not on file  Occupational History   Occupation: [employer]    Comment: Theatre stage manager    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[employer]    Comment: RETIRED  Tobacco Use   Smoking status: Former    Current packs/day: 0.50    Average packs/day: 0.5 packs/day for 25.0 years (12.5 ttl pk-yrs)    Types: Cigarettes    Passive exposure: Past   Smokeless tobacco: Never  Vaping Use   Vaping status: Never Used  Substance and Sexual Activity   Alcohol use: No    Alcohol/week: 0.0 standard drinks of alcohol   Drug use: No   Sexual activity: Not Currently  Other Topics Concern   Not on file  Social History Narrative   RETIRED   Married 23 yrs, currently divorced   Daughter born '65, Son born '69; 2 grandchildren - 1 here, 1 in [place]   Brother stays with her sometimes.   Social Drivers of Corporate investment banker Strain: Low Risk  (02/26/2024)   Overall Financial Resource Strain (CARDIA)    Difficulty of Paying Living Expenses: Not hard at all  Food Insecurity: No Food Insecurity (02/26/2024)   Hunger Vital Sign     Worried About Running Out of Food in the Last Year: Never true    Ran Out of Food in the Last Year: Never true  Transportation Needs: No Transportation Needs (02/26/2024)   PRAPARE - Administrator, Civil Service (Medical): No    Lack of Transportation (Non-Medical): No  Physical Activity: Insufficiently Active (02/26/2024)   Exercise Vital Sign    Days of Exercise per Week: 5 days    Minutes of Exercise per Session: 10 min  Stress: No Stress Concern Present (02/26/2024)   [employer] of Occupational Health - Occupational Stress Questionnaire    Feeling of Stress : Not at all  Social Connections: Moderately Isolated (02/26/2024)  Social Advertising account executive [NHANES]    Frequency of Communication with Friends and Family: More than three times a week    Frequency of Social Gatherings with Friends and Family: Never    Attends Religious Services: Never    Database administrator or Organizations: Yes    Attends Engineer, structural: Never    Marital Status: Divorced    Tobacco Counseling Counseling given: No    Clinical Intake:  Pre-visit preparation completed: Yes  Pain : No/denies pain     BMI - recorded: 44.98 Nutritional Status: BMI > 30  Obese Nutritional Risks: None Diabetes: Yes CBG done?: Yes CBG resulted in Enter/ Edit results?: Yes (fasting - 166) Did pt. bring in CBG monitor from home?: No  Lab Results  Component Value Date   HGBA1C 7.2 (H) 12/28/2023   HGBA1C 8.1 (H) 04/25/2023   HGBA1C 8.7 (H) 12/01/2022     How often do you need to have someone help you when you read instructions, pamphlets, or other written materials from your doctor or pharmacy?: 1 - Never  Interpreter Needed?: No  Information entered by :: Kandy Orris, CMA   Activities of Daily Living     02/26/2024    8:53 AM  In your present state of health, do you have any difficulty performing the following activities:  Hearing? 0  Vision? 0  Difficulty  concentrating or making decisions? 0  Walking or climbing stairs? 0  Dressing or bathing? 0  Doing errands, shopping? 0  Preparing Food and eating ? N  Using the Toilet? N  In the past six months, have you accidently leaked urine? Y  Comment wears a pad - sees a Urologist  Do you have problems with loss of bowel control? N  Managing your Medications? N  Managing your Finances? N  Housekeeping or managing your Housekeeping? N    Patient Care Team: Arcadio Knuckles, MD as PCP - General (Internal Medicine) Dion Frankel, [facility] (Pharmacist) Erman Hayward, MD as Consulting Physician (Urology)  Indicate any recent Medical Services you may have received from other than [facility] providers in the past year (date may be approximate).     Assessment:   This is a routine wellness examination for [place].  Hearing/Vision screen Hearing Screening - Comments:: Denies hearing difficulties   Vision Screening - Comments:: Wears rx glasses - up to date with routine eye exams with [facility]   Goals Addressed               This Visit's Progress     Patient Stated (pt-stated)        Patient stated she wants to stay active due to having arthritis.        Depression Screen     02/26/2024    8:59 AM 10/23/2023    8:16 AM 09/25/2023    9:30 AM 02/22/2023    9:06 AM 01/26/2023    9:25 AM 10/05/2022    1:19 PM 01/28/2022    4:04 PM  PHQ 2/9 Scores  PHQ - 2 Score 0 0 2 0 0 0 0  PHQ- 9 Score 1  3        Fall Risk     02/26/2024    8:55 AM 09/25/2023    9:30 AM 02/22/2023    8:54 AM 01/26/2023    9:25 AM 01/24/2023    4:35 PM  Fall Risk   Falls in the past year? 0 0 1  0   Number falls in past yr: 0 0 0 0   Injury with Fall? 0 0 0 0   Risk for fall due to : No Fall Risks Impaired balance/gait History of fall(s);Orthopedic patient;Impaired balance/gait;Other (Comment) No Fall Risks Impaired balance/gait;Other (Comment)  Risk for fall due to: Comment   intermittent vertigo   acute dizziness  Follow up Falls prevention discussed;Falls evaluation completed Falls evaluation completed Education provided;Falls prevention discussed Falls evaluation completed Education provided;Falls prevention discussed    MEDICARE RISK AT HOME:  Medicare Risk at Home Any stairs in or around the home?: Yes (outside) If so, are there any without handrails?: No Home free of loose throw rugs in walkways, pet beds, electrical cords, etc?: Yes Adequate lighting in your home to reduce risk of falls?: Yes Life alert?: No Use of a cane, walker or w/c?: Yes (cane) Grab bars in the bathroom?: Yes Shower chair or bench in shower?: No Elevated toilet seat or a handicapped toilet?: No  TIMED UP AND GO:  Was the test performed?  No  Cognitive Function: 6CIT completed    08/22/2018    2:13 PM 08/14/2017   11:50 AM 10/21/2015    8:19 AM  MMSE - Mini Mental State Exam  Not completed:   --  Orientation to time 5 5   Orientation to Place 5 5   Registration 3 3   Attention/ Calculation 5 5   Recall 1 2   Language- name 2 objects 2 2   Language- repeat 1 1   Language- follow 3 step command 3 3   Language- read & follow direction 1 1   Write a sentence 1 1   Copy design 1 1   Total score 28 29         02/26/2024    8:59 AM 02/22/2023    9:12 AM  6CIT Screen  What Year? 0 points 0 points  What month? 0 points 0 points  What time? 0 points 0 points  Count back from 20 0 points 0 points  Months in reverse 0 points 0 points  Repeat phrase 2 points 0 points  Total Score 2 points 0 points    Immunizations Immunization History  Administered Date(s) Administered   Fluad Quad(high Dose 65+) 08/05/2019, 08/30/2021   Influenza Split 08/15/2012   Influenza Whole 07/15/2008, 07/26/2010, 07/19/2011   Influenza, High Dose Seasonal PF 09/04/2013, 09/27/2016, 08/14/2017, 08/22/2018   Influenza,inj,Quad PF,6+ Mos 07/11/2014   Influenza-Unspecified 08/15/2015, 07/23/2020, 07/26/2022,  07/30/2023   Moderna Covid-19 Vaccine Bivalent Booster 39yrs & up 07/06/2021   Moderna SARS-COV2 Booster Vaccination 09/16/2020, 08/16/2022   Moderna Sars-Covid-2 Vaccination 12/27/2019, 01/24/2020   Pneumococcal Conjugate-13 04/08/2014   Pneumococcal Polysaccharide-23 02/14/2012, 08/05/2019   Td 09/16/2008   Tdap 09/25/2018   Zoster, Live 07/09/2015    Screening Tests Health Maintenance  Topic Date Due   COVID-19 Vaccine (6 - 2024-25 season) 07/16/2023   Zoster Vaccines- Shingrix (1 of 2) 03/26/2024 (Originally 05/15/1993)   INFLUENZA VACCINE  06/14/2024   HEMOGLOBIN A1C  06/26/2024   OPHTHALMOLOGY EXAM  09/26/2024   Diabetic kidney evaluation - eGFR measurement  12/27/2024   Diabetic kidney evaluation - Urine ACR  12/27/2024   FOOT EXAM  12/27/2024   Medicare Annual Wellness (AWV)  02/25/2025   DTaP/Tdap/Td (3 - Td or Tdap) 09/25/2028   Pneumonia Vaccine 71+ Years old  Completed   DEXA SCAN  Completed   HPV VACCINES  Aged Out   Meningococcal B Vaccine  Aged Out   Fecal DNA (Cologuard)  Discontinued    Health Maintenance  Health Maintenance Due  Topic Date Due   COVID-19 Vaccine (6 - 2024-25 season) 07/16/2023   Health Maintenance Items Addressed:02/26/2024   Additional Screening:  Vision Screening: Recommended annual ophthalmology exams for early detection of glaucoma and other disorders of the eye.  Pt stated had routine/diabetic eye exam in 09/2023 w/[facility].   Dental Screening: Recommended annual dental exams for proper oral hygiene  Community Resource Referral / Chronic Care Management: CRR required this visit?  No   CCM required this visit?  No     Plan:     I have personally reviewed and noted the following in the patient's chart:   Medical and social history Use of alcohol, tobacco or illicit drugs  Current medications and supplements including opioid prescriptions. Patient is not currently taking opioid prescriptions. Functional  ability and status Nutritional status Physical activity Advanced directives List of other physicians Hospitalizations, surgeries, and ER visits in previous 12 months Vitals Screenings to include cognitive, depression, and falls Referrals and appointments  In addition, I have reviewed and discussed with patient certain preventive protocols, quality metrics, and best practice recommendations. A written personalized care plan for preventive services as well as general preventive health recommendations were provided to patient.     Patria Bookbinder, CMA   02/26/2024   After Visit Summary: (Mail) Due to this being a telephonic visit, the after visit summary with patients personalized plan was offered to patient via mail   Notes: Nothing significant to report at this time.

## 2024-02-27 ENCOUNTER — Ambulatory Visit
Admission: RE | Admit: 2024-02-27 | Discharge: 2024-02-27 | Disposition: A | Discharge status: Home / Self Care | Source: Ambulatory Visit | Attending: Urology | Admitting: Urology

## 2024-02-27 ENCOUNTER — Encounter: Payer: Self-pay | Admitting: Urology

## 2024-02-27 DIAGNOSIS — R9389 Abnormal findings on diagnostic imaging of other specified body structures: Secondary | ICD-10-CM | POA: Diagnosis not present

## 2024-02-27 DIAGNOSIS — D219 Benign neoplasm of connective and other soft tissue, unspecified: Secondary | ICD-10-CM

## 2024-03-05 DIAGNOSIS — M1711 Unilateral primary osteoarthritis, right knee: Secondary | ICD-10-CM | POA: Diagnosis not present

## 2024-03-05 DIAGNOSIS — M1712 Unilateral primary osteoarthritis, left knee: Secondary | ICD-10-CM | POA: Diagnosis not present

## 2024-03-05 DIAGNOSIS — M170 Bilateral primary osteoarthritis of knee: Secondary | ICD-10-CM | POA: Diagnosis not present

## 2024-03-20 ENCOUNTER — Telehealth: Payer: Self-pay

## 2024-03-20 ENCOUNTER — Encounter (HOSPITAL_BASED_OUTPATIENT_CLINIC_OR_DEPARTMENT_OTHER): Payer: Self-pay | Admitting: Obstetrics & Gynecology

## 2024-03-20 ENCOUNTER — Ambulatory Visit (HOSPITAL_BASED_OUTPATIENT_CLINIC_OR_DEPARTMENT_OTHER): Admitting: Obstetrics & Gynecology

## 2024-03-20 VITALS — BP 130/82 | HR 69 | Ht 68.5 in | Wt 303.0 lb

## 2024-03-20 DIAGNOSIS — R935 Abnormal findings on diagnostic imaging of other abdominal regions, including retroperitoneum: Secondary | ICD-10-CM | POA: Diagnosis not present

## 2024-03-20 NOTE — Progress Notes (Signed)
 GYNECOLOGY  VISIT  CC:   abnormal ultrasound, referred from Dr. Clarke Crouch  HPI: 83 y.o. G2P2 Divorced [place] or [place] American female here for discussion of recent ultrasound and recommendations.  Pt has been seen by Dr. Clarke Crouch, urology, and underwent ultrasound on 02/28/2024 due to hx of microscopic hematuria.  Pt has not had any abnormal vaginal bleeding.  Ultrasound showed uterus 7.4 x 3.7 x 4.5cm, 67ml volume with thickened endometrium of 15mm.  Ovaries not need.  No fibroid noted.  Images from ultrasound personally reviewed and interpreted independently.  Findings most c/w polyp with some images but no as clear in other images.  Given no bleeding, feel sonohysterogram would be most useful for determining presence of endometrial polyp.  Also denies any pelvic cramping.  We discuss endometrial biopsy as well today.  She would like to proceed with additional ultrasound evaluation prior to biopsy.  Last pap 2015.  With no bleeding, screening pap smear not recommended.  Last MMG:  04/27/2023.  H/o diabetes with last hbA1C on 12/28/2023 which was 7.2 (decreased from 8.1).     Past Medical History:  Diagnosis Date   Closed dislocation of shoulder    unspecified site   DJD (degenerative joint disease) of knee    Right knee   DM (diabetes mellitus) (HCC)    Hyperlipidemia    Hypertension    Obesity, morbid (HCC)    OSA (obstructive sleep apnea) 02/03/2017    MEDS:   Current Outpatient Medications on File Prior to Visit  Medication Sig Dispense Refill   Accu-Chek FastClix Lancets MISC USE THREE TIMES DAILY 306 each 1   acetaminophen  (TYLENOL ) 500 MG tablet Take 1,000 mg by mouth every 6 (six) hours as needed for mild pain.     atorvastatin  (LIPITOR) 80 MG tablet Take 1 tablet (80 mg total) by mouth daily. 90 tablet 1   Blood Glucose Monitoring Suppl (ACCU-CHEK AVIVA PLUS) w/Device KIT Use to check blood sugars daily Dx E11.9 1 kit 0   carvedilol  (COREG ) 12.5 MG tablet Take 1 tablet (12.5 mg  total) by mouth 2 (two) times daily with a meal. 180 tablet 0   cetirizine (ZYRTEC) 10 MG tablet Take 10 mg by mouth daily.     Cholecalciferol  2000 units TABS Take 1 tablet (2,000 Units total) by mouth daily. 90 tablet 1   Cyanocobalamin (VITAMIN B-12 CR PO) Take 1 tablet by mouth daily.     docusate sodium (COLACE) 50 MG capsule Take 50 mg by mouth daily.     empagliflozin  (JARDIANCE ) 25 MG TABS tablet TAKE 1 TABLET EVERY DAY BEFORE BREAKFAST 90 tablet 0   Finerenone  (KERENDIA ) 20 MG TABS Take 1 tablet (20 mg total) by mouth daily. [employer] Patient Assistance Medication 90 tablet 1   Glucagon  (GVOKE HYPOPEN  2-PACK) 1 MG/0.2ML SOAJ Inject 1 Act into the skin daily as needed. 2 mL 5   glucose blood (ACCU-CHEK GUIDE TEST) test strip Check blood sugars up to TID 300 each 3   indapamide  (LOZOL ) 1.25 MG tablet Take 1 tablet (1.25 mg total) by mouth daily. 90 tablet 1   insulin  glargine (LANTUS  SOLOSTAR) 100 UNIT/ML Solostar Pen Inject 40 Units into the skin daily. Gets through PAP     Insulin  Pen Needle (DROPLET PEN NEEDLES) 32G X 6 MM MISC Inject 1 Needle into the skin daily. 100 each 3   olmesartan  (BENICAR ) 20 MG tablet Take 1 tablet (20 mg total) by mouth daily. 90 tablet 0   polyethylene glycol powder (  GLYCOLAX/MIRALAX) 17 GM/SCOOP powder Take 119 g by mouth daily.     triamcinolone  cream (KENALOG ) 0.1 % Apply 1 Application topically 2 (two) times daily. 80 g 2   tirzepatide  (MOUNJARO ) 2.5 MG/0.5ML Pen Inject 2.5 mg into the skin once a week. (Patient not taking: Reported on 03/20/2024) 2 mL 1   No current facility-administered medications on file prior to visit.    ALLERGIES: Nifedipine , Amlodipine , Oxycodone , and Penicillins  SH:  divorced, non smoker  Review of Systems  Constitutional: Negative.   Genitourinary: Negative.     PHYSICAL EXAMINATION:    BP 130/82 (Cuff Size: Large)   Pulse 69   Ht 5' 8.5" (1.74 m)   Wt (!) 303 lb (137.4 kg)   BMI 45.40 kg/m     Physical  Exam Constitutional:      Appearance: Normal appearance.  Neurological:     General: No focal deficit present.     Mental Status: She is alert.  Psychiatric:        Mood and Affect: Mood normal.      Assessment/Plan: 1. Abnormal ultrasound of endometrium (Primary) - pt will return for additional ultrasound imaging with sonohysterogram.  If polyp not clear on follow up u/s will recommend proceeding with endometrial biopsy.  Questions answered. - US  PELVIS TRANSVAGINAL NON-OB (TV ONLY); Future

## 2024-03-20 NOTE — Telephone Encounter (Signed)
 This patient is appearing on a report for being at risk of failing the adherence measure for diabetes medications this calendar year.   Medication: Mounjaro  5 mg weekly Last fill date: 01/19/24 for 28 day supply  Mounjaro  decreased to 2.5 mg dose at last PharmD visit in April due to constipation. Last filled 2.5 mg dose on 02/29/24 for 28 day supply. No further action needed at this time.    Abelina Abide, PharmD PGY1 Pharmacy Resident 03/20/2024 1:30 PM

## 2024-03-26 ENCOUNTER — Telehealth: Payer: Self-pay | Admitting: Internal Medicine

## 2024-03-26 NOTE — Telephone Encounter (Signed)
 Copied from CRM [id number]. Topic: Clinical - Medication Question >> Mar 26, 2024 11:43 AM Bambi Bonine D wrote: Reason for CRM: Pt is calling to check the status of her insulin  medication. Pt stated that she os down to her last 2 pens and was checking to see if the medication is ready for pick up at the office. Pt would like for Dr.Johns nurse to give her a call back today if possible.

## 2024-03-27 ENCOUNTER — Ambulatory Visit (HOSPITAL_BASED_OUTPATIENT_CLINIC_OR_DEPARTMENT_OTHER): Admitting: Obstetrics & Gynecology

## 2024-03-27 ENCOUNTER — Ambulatory Visit (HOSPITAL_BASED_OUTPATIENT_CLINIC_OR_DEPARTMENT_OTHER)

## 2024-03-27 VITALS — Ht 68.5 in | Wt 302.8 lb

## 2024-03-27 DIAGNOSIS — R935 Abnormal findings on diagnostic imaging of other abdominal regions, including retroperitoneum: Secondary | ICD-10-CM | POA: Diagnosis not present

## 2024-03-27 DIAGNOSIS — N950 Postmenopausal bleeding: Secondary | ICD-10-CM

## 2024-03-27 NOTE — Telephone Encounter (Signed)
 Unable to reach patient. LMTRC

## 2024-03-28 NOTE — Telephone Encounter (Signed)
 Patient has been made aware and gave a verbal understanding.

## 2024-03-28 NOTE — Telephone Encounter (Signed)
 Could you please help me with finding out when her medication will be back in the office?

## 2024-03-28 NOTE — Telephone Encounter (Signed)
 When they reply will it come to me or you ? Just so I can't make the patient aware.

## 2024-03-30 ENCOUNTER — Encounter (HOSPITAL_BASED_OUTPATIENT_CLINIC_OR_DEPARTMENT_OTHER): Payer: Self-pay | Admitting: Obstetrics & Gynecology

## 2024-03-30 NOTE — Progress Notes (Signed)
 GYNECOLOGY  VISIT  CC:   Discuss ultrasound results, postmenopausal bleeding  HPI: 83 y.o. G2P2 Divorced [place] or [place] American female here for discussion of ultrasound results.  Ultrasound obtained due to postmenopausal bleeding.  Uterus measures 6.3 x 3.2 x 4.4 cm.  The endometrium is thickened at 1.4 cm.  Cystic spaces are noted.  No abnormal blood flow is noted.  Neither ovary was clearly seen.  However no masses or cystic lesions were noted in the adnexal regions.  There is no free fluid present.  Findings reviewed with patient.  Given thickening of the endometrium and what appears to be an endometrial polyp, I do think she should undergo removal.  Hysteroscopy with polyp removal discussed.Procedure discussed with patient.  Recovery and pain management discussed.  Risks discussed including but not limited to bleeding, rare risk of transfusion, infection, 1% risk of uterine perforation with risks of fluid deficit causing cardiac arrythmia, cerebral swelling and/or need to stop procedure early.  Fluid emboli and rare risk of death discussed.  DVT/PE, rare risk of risk of bowel/bladder/ureteral/vascular injury.  Patient aware if pathology abnormal she may need additional treatment.  All questions answered.     Past Medical History:  Diagnosis Date   Closed dislocation of shoulder    unspecified site   DJD (degenerative joint disease) of knee    Right knee   DM (diabetes mellitus) (HCC)    Hyperlipidemia    Hypertension    Obesity, morbid (HCC)    OSA (obstructive sleep apnea) 02/03/2017    MEDS:   Current Outpatient Medications on File Prior to Visit  Medication Sig Dispense Refill   Accu-Chek FastClix Lancets MISC USE THREE TIMES DAILY 306 each 1   acetaminophen  (TYLENOL ) 500 MG tablet Take 1,000 mg by mouth every 6 (six) hours as needed for mild pain.     atorvastatin  (LIPITOR) 80 MG tablet Take 1 tablet (80 mg total) by mouth daily. 90 tablet 1   Blood Glucose Monitoring Suppl  (ACCU-CHEK AVIVA PLUS) w/Device KIT Use to check blood sugars daily Dx E11.9 1 kit 0   carvedilol  (COREG ) 12.5 MG tablet Take 1 tablet (12.5 mg total) by mouth 2 (two) times daily with a meal. 180 tablet 0   cetirizine (ZYRTEC) 10 MG tablet Take 10 mg by mouth daily.     Cholecalciferol  2000 units TABS Take 1 tablet (2,000 Units total) by mouth daily. 90 tablet 1   Cyanocobalamin (VITAMIN B-12 CR PO) Take 1 tablet by mouth daily.     docusate sodium (COLACE) 50 MG capsule Take 50 mg by mouth daily.     empagliflozin  (JARDIANCE ) 25 MG TABS tablet TAKE 1 TABLET EVERY DAY BEFORE BREAKFAST 90 tablet 0   Finerenone  (KERENDIA ) 20 MG TABS Take 1 tablet (20 mg total) by mouth daily. Bayer Patient Assistance Medication 90 tablet 1   Glucagon  (GVOKE HYPOPEN  2-PACK) 1 MG/0.2ML SOAJ Inject 1 Act into the skin daily as needed. 2 mL 5   glucose blood (ACCU-CHEK GUIDE TEST) test strip Check blood sugars up to TID 300 each 3   indapamide  (LOZOL ) 1.25 MG tablet Take 1 tablet (1.25 mg total) by mouth daily. 90 tablet 1   insulin  glargine (LANTUS  SOLOSTAR) 100 UNIT/ML Solostar Pen Inject 40 Units into the skin daily. Gets through PAP     Insulin  Pen Needle (DROPLET PEN NEEDLES) 32G X 6 MM MISC Inject 1 Needle into the skin daily. 100 each 3   olmesartan  (BENICAR ) 20 MG tablet Take  1 tablet (20 mg total) by mouth daily. 90 tablet 0   polyethylene glycol powder (GLYCOLAX/MIRALAX) 17 GM/SCOOP powder Take 119 g by mouth daily.     tirzepatide  (MOUNJARO ) 2.5 MG/0.5ML Pen Inject 2.5 mg into the skin once a week. (Patient not taking: Reported on 03/20/2024) 2 mL 1   triamcinolone  cream (KENALOG ) 0.1 % Apply 1 Application topically 2 (two) times daily. 80 g 2   No current facility-administered medications on file prior to visit.    ALLERGIES: Nifedipine , Amlodipine , Oxycodone , and Penicillins  SH:  single, non smoker  Review of Systems  Constitutional: Negative.   Genitourinary: Negative.     PHYSICAL EXAMINATION:     Ht 5' 8.5" (1.74 m)   Wt (!) 302 lb 12.8 oz (137.3 kg)   BMI 45.37 kg/m      Physical Exam Constitutional:      Appearance: Normal appearance.  Neurological:     General: No focal deficit present.     Mental Status: She is alert.  Psychiatric:        Mood and Affect: Mood normal.     Assessment/Plan: 1. Postmenopausal bleeding (Primary) - will proceed with scheduling hysteroscopy with polyp resection, D&C - will also reach out to Dr. Rochelle Chu for any additional pre-surgical recommendations  2. Abnormal ultrasound of endometrium

## 2024-04-09 ENCOUNTER — Other Ambulatory Visit: Payer: Self-pay | Admitting: Internal Medicine

## 2024-04-09 DIAGNOSIS — E118 Type 2 diabetes mellitus with unspecified complications: Secondary | ICD-10-CM

## 2024-04-09 NOTE — Telephone Encounter (Unsigned)
 Copied from CRM [id number]. Topic: Clinical - Prescription Issue >> Apr 09, 2024  9:46 AM Kevelyn M wrote: Reason for CRM: Patient stated she's apart of a patient assistance program and the next delivery isn't until June 14th for Insulin  Pen Needle (DROPLET PEN NEEDLES) but her supply is going to run out on Sunday. She want to know if she can't get this prescription at : [facility] [address] [place], [place], [place] [address], : Audiological scientist.

## 2024-04-10 ENCOUNTER — Other Ambulatory Visit: Payer: Self-pay | Admitting: Internal Medicine

## 2024-04-10 DIAGNOSIS — I10 Essential (primary) hypertension: Secondary | ICD-10-CM

## 2024-04-11 ENCOUNTER — Other Ambulatory Visit (INDEPENDENT_AMBULATORY_CARE_PROVIDER_SITE_OTHER): Admitting: Pharmacist

## 2024-04-11 DIAGNOSIS — Z794 Long term (current) use of insulin: Secondary | ICD-10-CM

## 2024-04-11 DIAGNOSIS — E119 Type 2 diabetes mellitus without complications: Secondary | ICD-10-CM

## 2024-04-11 MED ORDER — LANTUS SOLOSTAR 100 UNIT/ML ~~LOC~~ SOPN
40.0000 [IU] | PEN_INJECTOR | Freq: Every day | SUBCUTANEOUS | 0 refills | Status: DC
Start: 2024-04-11 — End: 2024-05-13

## 2024-04-11 NOTE — Progress Notes (Signed)
 04/11/2024 Name: Holly [name] MRN: [id number] DOB: [birth year]  Chief Complaint  Patient presents with   Diabetes   Medication Management    ARROW EMMERICH is a 83 y.o. year old female who presented for a telephone visit.   They were referred to the pharmacist by their PCP for assistance in managing diabetes, hypertension, hyperlipidemia, and CKD.   Subjective:  Care Team: Primary Care Provider: Arcadio Knuckles, MD ; Next Scheduled Visit: not scheduled  Medication Access/Adherence  Current Pharmacy:  [facility] - [place], [place] - [address] [place] [address] Phone: [phone] Fax: [phone]  [facility] [place], [place] - [address] [place] [address] Phone: [phone] Fax: [phone]   Patient reports affordability concerns with their medications: Yes  Patient reports access/transportation concerns to their pharmacy: Yes  Patient reports adherence concerns with their medications:  No    *Pt only has 1 week left of Lantus  and supply from PAP won't be here for atleast 3 weeks  Diabetes: Current medications: Jardiance  25 mg daily, Lantus  40 units daily *Mounjaro  - pt self-discontinued due to constipation Medications tried in the past: metformin  stopped end of Feb  Glucose readings: no change from previous Using Accu-chek meter; testing 1-2 times daily  Patient denies hypoglycemic s/sx including dizziness, shakiness, sweating.   Current medication access support: Jardiance  and Lantus  through PAP - renrolled for 2025  Hypertension: Current medications: Olmesartan  20 mg daily, indapamide  1.25 mg daily, carvedilol  12.5 mg twice daily Medications previously tried: Edarbyclor, amlodipine  (edema), HCTZ  Patient has a validated, automated, upper arm home BP cuff Current blood pressure readings: n/a  CKD: Current medications: olmesartan  20 mg daily,  Jardiance  25 mg daily, Kerendia  20 mg daily  Med access support: Kerendia  through PAP - reenrolled for 2025  Hyperlipidemia/ASCVD Risk Reduction Current lipid lowering medications: atorvastatin  40 mg 2 tablets daily Medications tried in the past: simvastatin   Patient denies muscle pain/weakness.  Antiplatelet regimen: none   Objective:  Lab Results  Component Value Date   HGBA1C 7.2 (H) 12/28/2023    Lab Results  Component Value Date   CREATININE 1.32 (H) 12/28/2023   BUN 22 12/28/2023   NA 140 12/28/2023   K 3.7 12/28/2023   CL 103 12/28/2023   CO2 29 12/28/2023    Lab Results  Component Value Date   CHOL 182 12/28/2023   HDL 74.30 12/28/2023   LDLCALC 91 12/28/2023   LDLDIRECT 134.8 08/27/2010   TRIG 84.0 12/28/2023   CHOLHDL 2 12/28/2023    Medications Reviewed Today     Reviewed by Dion Frankel, RPH (Pharmacist) on 04/11/24 at 1605  Med List Status: <None>   Medication Order Taking? Sig Documenting Provider Last Dose Status Informant  Accu-Chek FastClix Lancets MISC [id number]  USE THREE TIMES DAILY Arcadio Knuckles, MD  Active   acetaminophen  (TYLENOL ) 500 MG tablet [id number]  Take 1,000 mg by mouth every 6 (six) hours as needed for mild pain. [provider]  Active Self  atorvastatin  (LIPITOR) 80 MG tablet [id number]  Take 1 tablet (80 mg total) by mouth daily. Arcadio Knuckles, MD  Active   Blood Glucose Monitoring Suppl (ACCU-CHEK AVIVA PLUS) w/Device KIT [id number]  Use to check blood sugars daily Dx E11.9 Arcadio Knuckles, MD  Active   carvedilol  (COREG ) 12.5 MG tablet [id number]  Take 1 tablet (12.5 mg total) by mouth 2 (two) times  daily with a meal. Arcadio Knuckles, MD  Active   cetirizine (ZYRTEC) 10 MG tablet [id number]  Take 10 mg by mouth daily. [provider]  Active Self           Med Note Aleta Hutch, [facility] D   Tue May 28, 2019 11:41 AM) Takes as needed  Cholecalciferol  2000 units TABS [id number]  Take 1 tablet (2,000 Units  total) by mouth daily. Arcadio Knuckles, MD  Active            Med Note Vandy Genera Jun 19, 2019  3:23 PM)    Cyanocobalamin (VITAMIN B-12 CR PO) [id number]  Take 1 tablet by mouth daily. [provider]  Active Self  docusate sodium (COLACE) 50 MG capsule [id number]  Take 50 mg by mouth daily. [provider]  Active   empagliflozin  (JARDIANCE ) 25 MG TABS tablet [id number]  TAKE 1 TABLET EVERY DAY BEFORE BREAKFAST Arcadio Knuckles, MD  Active   Finerenone  (KERENDIA ) 20 MG TABS [id number]  Take 1 tablet (20 mg total) by mouth daily. [employer]ayer Patient Assistance Medication Arcadio Knuckles, MD  Active   Glucagon  (GVOKE HYPOPEN  2-PACK) 1 MG/0.2ML [place] [id number]  Inject 1 Act into the skin daily as needed. Arcadio Knuckles, MD  Active   glucose blood (ACCU-CHEK GUIDE TEST) test strip [id number]  Check blood sugars up to TID Arcadio Knuckles, MD  Active   indapamide  (LOZOL ) 1.25 MG tablet [id number]  Take 1 tablet (1.25 mg total) by mouth daily. Arcadio Knuckles, MD  Active   insulin  glargine (LANTUS  SOLOSTAR) 100 UNIT/ML Solostar Pen [id number] Yes Inject 40 Units into the skin daily. Arcadio Knuckles, MD Taking Active            Med Note Lolly Riser, Cimone Fahey R   Thu Apr 11, 2024  4:05 PM) Gets from PAP, but is going to run out before next shipment so will end 30 DS to local pharmacy  Insulin  Pen Needle (DROPLET PEN NEEDLES) 32G X 6 MM MISC [id number]  Inject 1 Needle into the skin daily. Arcadio Knuckles, MD  Active   olmesartan  (BENICAR ) 20 MG tablet [id number]  Take 1 tablet (20 mg total) by mouth daily. Arcadio Knuckles, MD  Active   polyethylene glycol powder (GLYCOLAX/MIRALAX) 17 GM/SCOOP powder [id number]  Take 119 g by mouth daily. [provider]  Active   tirzepatide  (MOUNJARO ) 2.5 MG/0.5ML Pen [id number] No Inject 2.5 mg into the skin once a week.  Patient not taking: Reported on 02/26/2024   Arcadio Knuckles, MD Not Taking Active            Med Note Lolly Riser, Maxine Huynh R   Mon Feb 26, 2024 11:18 AM) Just finished 5 mg, going to start 2.5 mg next week  triamcinolone  cream (KENALOG ) 0.1 % [id number]  Apply 1 Application topically 2 (two) times daily. Hershel Los F, FNP  Active               Assessment/Plan:    Diabetes: - Currently controlled, A1c goal <7.5%.  - Reviewed goal A1c, goal fasting, and goal 2 hour post prandial glucose - Sent Lantus  30 DS to [facility] - Will wait for next A1c to determine if we need to try GLP-1 again or other medication change. A1c had reduced to goal due to Mounjaro  - Recommend to check glucose 1-2 x per day   Hypertension: - Currently uncontrolled, BP goal <130/80 - Reviewed  appropriate blood pressure monitoring technique and reviewed goal blood pressure. Recommended to check home blood pressure and heart rate daily - Recommend to monitor BP at home to see if controlled per home readings. BP may decrease as she loses weight after Mounjaro  restart   CKD - Currently uncontrolled. Goal MACR <30. Last MACR >1000 - Continue current regimen   Hyperlipidemia/ASCVD Risk Reduction: - Currently uncontrolled. LDL goal <55 due to T2DM + CAD - Just increased atorvastatin  to 80 mg daily. Await next lipid panel.  Follow up: 6/30   Rainelle Bur, PharmD, BCPS, CPP Clinical Pharmacist Practitioner [facility] [phone]

## 2024-04-11 NOTE — Patient Instructions (Signed)
 It was a pleasure speaking with you today!  I have sent 1 month supply of Lantus  to [facility] for you to pick up.  Feel free to call with any questions or concerns!  Rainelle Bur, PharmD, BCPS, CPP Clinical Pharmacist Practitioner [place] [facility] [facility] [phone]

## 2024-04-12 MED ORDER — DROPLET PEN NEEDLES 32G X 6 MM MISC: 1.0000 | Freq: Every day | 0 refills | Status: AC

## 2024-04-15 ENCOUNTER — Other Ambulatory Visit

## 2024-04-19 ENCOUNTER — Other Ambulatory Visit: Payer: Self-pay | Admitting: Internal Medicine

## 2024-04-19 DIAGNOSIS — I10 Essential (primary) hypertension: Secondary | ICD-10-CM

## 2024-04-22 ENCOUNTER — Telehealth: Payer: Self-pay | Admitting: Pharmacist

## 2024-04-22 DIAGNOSIS — I10 Essential (primary) hypertension: Secondary | ICD-10-CM

## 2024-04-22 MED ORDER — CARVEDILOL 12.5 MG PO TABS
12.5000 mg | ORAL_TABLET | Freq: Two times a day (BID) | ORAL | 0 refills | Status: DC
Start: 2024-04-22 — End: 2024-07-23

## 2024-04-22 NOTE — Telephone Encounter (Signed)
 Reviewed chart and  noted carvedilol  refill request was declined due to "refill too soon" however last refill was 01/19/24 for 90 DS with 0 refill therefore refill is due now. Pt is due for PCP f/u in August, appt already scheduled.  Carvedilol  refill sent to [facility] to cover her until next PCP f/u.  Rainelle Bur, PharmD, BCPS, CPP Clinical Pharmacist Practitioner [facility] [phone]

## 2024-05-03 ENCOUNTER — Telehealth: Payer: Self-pay

## 2024-05-03 NOTE — Telephone Encounter (Signed)
 Gave pt a call and spoke with pt to let her know her prescription has been mail out from [facility]  on 6/14 it will take 7-10 days for provider to received med if pt does not hear from providers office to call back. Pt is aware.

## 2024-05-03 NOTE — Telephone Encounter (Signed)
 Copied from CRM [id number]. Topic: Clinical - Prescription Issue >> May 03, 2024  8:57 AM Jenice Mitts wrote: Reason for CRM: Patient is calling because she is about to run out of insulin  and she has not received a call from us  to come and pick it up. She cannot afford to pick it up at [facility]

## 2024-05-03 NOTE — Telephone Encounter (Signed)
 Gave SANOFI a call to follow up on pt refill status,representative said they have mail out a refill on 04/27/24 it takes 7-10 days for provider's office to received.

## 2024-05-08 ENCOUNTER — Other Ambulatory Visit: Payer: Self-pay | Admitting: Internal Medicine

## 2024-05-08 DIAGNOSIS — E118 Type 2 diabetes mellitus with unspecified complications: Secondary | ICD-10-CM

## 2024-05-08 DIAGNOSIS — I10 Essential (primary) hypertension: Secondary | ICD-10-CM

## 2024-05-13 ENCOUNTER — Other Ambulatory Visit (INDEPENDENT_AMBULATORY_CARE_PROVIDER_SITE_OTHER): Admitting: Pharmacist

## 2024-05-13 DIAGNOSIS — Z794 Long term (current) use of insulin: Secondary | ICD-10-CM

## 2024-05-13 DIAGNOSIS — E119 Type 2 diabetes mellitus without complications: Secondary | ICD-10-CM

## 2024-05-13 MED ORDER — LANTUS SOLOSTAR 100 UNIT/ML ~~LOC~~ SOPN: 50.0000 [IU] | PEN_INJECTOR | Freq: Every day | SUBCUTANEOUS | Status: DC

## 2024-05-13 NOTE — Progress Notes (Signed)
 05/13/2024 Name: Holly [name] MRN: [id number] DOB: 05/15/1941  Chief Complaint  Patient presents with   Medication Management    [name] is a 83 y.o. year old female who presented for a telephone visit.   They were referred to the pharmacist by their PCP for assistance in managing diabetes, hypertension, hyperlipidemia, and CKD.   Subjective:  Care Team: Primary Care Provider: Joshua Debby CROME, MD ; Next Scheduled Visit: 06/27/24  Medication Access/Adherence  Current Pharmacy:  [facility] - [place], [place] - [address] [place] [address] Phone: [phone] Fax: [phone]  [facility] [address] [place], [place] - [address] [place] [address] Phone: [phone] Fax: [phone]   Patient reports affordability concerns with their medications: Yes  Patient reports access/transportation concerns to their pharmacy: Yes  Patient reports adherence concerns with their medications:  No    Diabetes: Current medications: Jardiance  25 mg daily, Lantus  50 units daily(self-increased from 40 to 50 units) *Mounjaro  - pt self-discontinued due to constipation Medications tried in the past: metformin  stopped end of Feb  Glucose readings: 125 this morning, 95 yesterday Using Accu-chek meter; testing 1-2 times daily  Patient denies hypoglycemic s/sx including dizziness, shakiness, sweating.   Current medication access support: Jardiance  and Lantus  through [facility] - renrolled for 2025  *She has had issues getting Lantus  shipment from [facility] - they were supposed to mail it 6/14 but it had not arrived at the office yet  Hypertension: Current medications: Olmesartan  20 mg daily, indapamide  1.25 mg daily, carvedilol  12.5 mg twice daily Medications previously tried: Edarbyclor, amlodipine  (edema), HCTZ  Patient has a validated, automated, upper arm home BP cuff Current blood pressure  readings: n/a  CKD: Current medications: olmesartan  20 mg daily, Jardiance  25 mg daily, Kerendia  20 mg daily  Med access support: Kerendia  through [facility] - reenrolled for 2025  Hyperlipidemia/ASCVD Risk Reduction Current lipid lowering medications: atorvastatin  80 mg daily Medications tried in the past: simvastatin   Patient denies muscle pain/weakness.  Antiplatelet regimen: none   Objective: BP Readings from Last 3 Encounters:  03/20/24 130/82  02/05/24 127/73  12/28/23 (!) 148/90     Lab Results  Component Value Date   HGBA1C 7.2 (H) 12/28/2023    Lab Results  Component Value Date   CREATININE 1.32 (H) 12/28/2023   BUN 22 12/28/2023   NA 140 12/28/2023   K 3.7 12/28/2023   CL 103 12/28/2023   CO2 29 12/28/2023    Lab Results  Component Value Date   CHOL 182 12/28/2023   HDL 74.30 12/28/2023   LDLCALC 91 12/28/2023   LDLDIRECT 134.8 08/27/2010   TRIG 84.0 12/28/2023   CHOLHDL 2 12/28/2023    Medications Reviewed Today     Reviewed by Merceda Lela SAUNDERS, RPH (Pharmacist) on 05/13/24 at 1638  Med List Status: <None>   Medication Order Taking? Sig Documenting Provider Last Dose Status Informant  Accu-Chek FastClix Lancets MISC [id number] Yes USE THREE TIMES DAILY Joshua Debby CROME, MD  Active   acetaminophen  (TYLENOL ) 500 MG tablet [id number] Yes Take 1,000 mg by mouth every 6 (six) hours as needed for mild pain. [provider]  Active Self  atorvastatin  (LIPITOR) 80 MG tablet [id number] Yes Take 1 tablet (80 mg total) by mouth daily. Joshua Debby CROME, MD  Active   Blood Glucose Monitoring Suppl (ACCU-CHEK AVIVA PLUS) w/Device KIT [id number] Yes Use to check blood sugars daily Dx E11.9  Joshua Debby CROME, MD  Active   carvedilol  (COREG ) 12.5 MG tablet [id number] Yes Take 1 tablet (12.5 mg total) by mouth 2 (two) times daily with a meal. Joshua Debby CROME, MD  Active   cetirizine (ZYRTEC) 10 MG tablet [id number] Yes Take 10 mg by mouth daily. [provider]   Active Self           Med Note VICTORIO, [facility] D   Tue May 28, 2019 11:41 AM) Takes as needed  Cholecalciferol  2000 units TABS [id number] Yes Take 1 tablet (2,000 Units total) by mouth daily. Joshua Debby CROME, MD  Active            Med Note MACK CASSIUS JINNY Stevan Jun 19, 2019  3:23 PM)    Cyanocobalamin (VITAMIN B-12 CR PO) [id number] Yes Take 1 tablet by mouth daily. [provider]  Active Self  docusate sodium (COLACE) 50 MG capsule [id number] Yes Take 50 mg by mouth daily. [provider]  Active   empagliflozin  (JARDIANCE ) 25 MG TABS tablet [id number] Yes TAKE 1 TABLET EVERY DAY BEFORE BREAKFAST Joshua Debby CROME, MD  Active   Finerenone  (KERENDIA ) 20 MG TABS [id number] Yes Take 1 tablet (20 mg total) by mouth daily. [employer] Patient Assistance Medication Joshua Debby CROME, MD  Active   Glucagon  (GVOKE HYPOPEN  2-PACK) 1 MG/0.2ML EMMANUEL [id number] Yes Inject 1 Act into the skin daily as needed. Joshua Debby CROME, MD  Active   glucose blood (ACCU-CHEK GUIDE TEST) test strip [id number] Yes Check blood sugars up to TID Joshua Debby CROME, MD  Active   indapamide  (LOZOL ) 1.25 MG tablet [id number] Yes Take 1 tablet (1.25 mg total) by mouth daily. Joshua Debby CROME, MD  Active   insulin  glargine (LANTUS  SOLOSTAR) 100 UNIT/ML Solostar Pen [id number] Yes Inject 40 Units into the skin daily. Joshua Debby CROME, MD  Active            Med Note VIOLETTA, Cordie Beazley R   Thu Apr 11, 2024  4:05 PM) Gets from [facility], but is going to run out before next shipment so will end 30 DS to local pharmacy  Insulin  Pen Needle (DROPLET PEN NEEDLES) 32G X 6 MM MISC [id number] Yes Inject 1 Needle into the skin daily. Joshua Debby CROME, MD  Active   olmesartan  (BENICAR ) 20 MG tablet [id number] Yes TAKE 1 TABLET EVERY DAY Joshua Debby CROME, MD  Active   polyethylene glycol powder (GLYCOLAX/MIRALAX) 17 GM/SCOOP powder [id number] Yes Take 119 g by mouth daily. [provider]  Active   triamcinolone  cream (KENALOG ) 0.1 % [id number] Yes  Apply 1 Application topically 2 (two) times daily. Merlynn Eland F, FNP  Active               Assessment/Plan:    Diabetes: - Currently controlled, A1c goal <7.5%.  - Reviewed goal A1c, goal fasting, and goal 2 hour post prandial glucose - Provided pt with Lantus  samples - Will wait for next A1c to determine if we need to try GLP-1 again or other medication change. A1c had reduced to goal due to Mounjaro  - Recommend to check glucose 1-2 x per day   Hypertension: - Currently controlled, BP goal <130/80 - Reviewed appropriate blood pressure monitoring technique and reviewed goal blood pressure. Recommended to check home blood pressure and heart rate daily  CKD - Currently uncontrolled. Goal MACR <30. Last MACR >1000 - Continue current regimen   Hyperlipidemia/ASCVD Risk Reduction: - Currently uncontrolled. LDL goal <55 due to  T2DM + CAD - Just increased atorvastatin  to 80 mg daily. Await next lipid panel.  Follow up: PCP apt August   Darrelyn Drum, PharmD, BCPS, CPP Clinical Pharmacist Practitioner [facility] [phone]

## 2024-05-13 NOTE — Telephone Encounter (Signed)
 Call [employer]  to follow up on pt refill,[facility] Cristy Pt has not received her refill on Lantus , per [employer] they are out of stock ,might received some this week to call back on Thursday.

## 2024-05-14 ENCOUNTER — Other Ambulatory Visit (HOSPITAL_BASED_OUTPATIENT_CLINIC_OR_DEPARTMENT_OTHER): Payer: Self-pay | Admitting: Obstetrics & Gynecology

## 2024-05-14 DIAGNOSIS — N950 Postmenopausal bleeding: Secondary | ICD-10-CM

## 2024-05-14 DIAGNOSIS — R935 Abnormal findings on diagnostic imaging of other abdominal regions, including retroperitoneum: Secondary | ICD-10-CM

## 2024-05-27 ENCOUNTER — Telehealth: Payer: Self-pay

## 2024-05-27 NOTE — Telephone Encounter (Signed)
 I called patient to see if she's available for surgery w/ Dr. Cleotilde on 07/23/24 @7 :30 am/[facility]. Patient agreed to schedule surgery and confirmed she will arrive at [facility] at 5:30 am on 07/23/24. Pre-Op Instructions an surgery details were provided over the phone.

## 2024-06-27 ENCOUNTER — Ambulatory Visit: Admitting: Internal Medicine

## 2024-06-27 ENCOUNTER — Encounter: Payer: Self-pay | Admitting: Internal Medicine

## 2024-06-27 VITALS — BP 138/78 | HR 67 | Temp 98.1°F | Resp 16 | Ht 68.5 in | Wt 303.0 lb

## 2024-06-27 DIAGNOSIS — Z794 Long term (current) use of insulin: Secondary | ICD-10-CM | POA: Diagnosis not present

## 2024-06-27 DIAGNOSIS — N1831 Chronic kidney disease, stage 3a: Secondary | ICD-10-CM | POA: Diagnosis not present

## 2024-06-27 DIAGNOSIS — E119 Type 2 diabetes mellitus without complications: Secondary | ICD-10-CM

## 2024-06-27 DIAGNOSIS — E118 Type 2 diabetes mellitus with unspecified complications: Secondary | ICD-10-CM

## 2024-06-27 DIAGNOSIS — E1122 Type 2 diabetes mellitus with diabetic chronic kidney disease: Secondary | ICD-10-CM | POA: Diagnosis not present

## 2024-06-27 DIAGNOSIS — E1121 Type 2 diabetes mellitus with diabetic nephropathy: Secondary | ICD-10-CM | POA: Diagnosis not present

## 2024-06-27 LAB — BASIC METABOLIC PANEL WITH GFR
BUN: 28 mg/dL — ABNORMAL HIGH (ref 6–23)
CO2: 26 meq/L (ref 19–32)
Calcium: 9.6 mg/dL (ref 8.4–10.5)
Chloride: 99 meq/L (ref 96–112)
Creatinine, Ser: 1.37 mg/dL — ABNORMAL HIGH (ref 0.40–1.20)
GFR: 36.31 mL/min — ABNORMAL LOW (ref >60.00)
Glucose, Bld: 183 mg/dL — ABNORMAL HIGH (ref 70–99)
Potassium: 3.8 meq/L (ref 3.5–5.1)
Sodium: 134 meq/L — ABNORMAL LOW (ref 135–145)

## 2024-06-27 LAB — URINALYSIS, ROUTINE W REFLEX MICROSCOPIC
Bilirubin Urine: NEGATIVE
Ketones, ur: NEGATIVE
Nitrite: POSITIVE — AB
Specific Gravity, Urine: 1.01 (ref 1.000–1.030)
Total Protein, Urine: 30 — AB
Urine Glucose: >=1000 — AB
Urobilinogen, UA: 0.2 (ref 0.0–1.0)
pH: 6 (ref 5.0–8.0)

## 2024-06-27 LAB — HEMOGLOBIN A1C: Hgb A1c MFr Bld: 7.9 % — ABNORMAL HIGH (ref 4.6–6.5)

## 2024-06-27 LAB — MICROALBUMIN / CREATININE URINE RATIO
Creatinine,U: 61.2 mg/dL
Microalb Creat Ratio: 366.1 mg/g — ABNORMAL HIGH (ref 0.0–30.0)
Microalb, Ur: 22.4 mg/dL — ABNORMAL HIGH (ref 0.0–1.9)

## 2024-06-27 NOTE — Progress Notes (Unsigned)
 Subjective:  Patient ID: Holly [name], female    DOB: 1941/02/11  Age: 83 y.o. MRN: [id number]  CC: Osteoarthritis, Hypertension, and Diabetes   HPI Holly [name] presents for f/up ----  Discussed the use of AI scribe software for clinical note transcription with the patient, who gave verbal consent to proceed.  History of Present Illness Holly [name] is an 83 year old female who presents with knee and back pain.  She experiences significant knee and back pain, describing it as 'a lot of pain.' No dizziness is associated with the pain.  Her blood sugar was high this morning before taking her medication, but she did not experience any symptoms such as dizziness. She took her insulin  and other medications without eating beforehand.  She has a history of constipation related to a diet medication, which she stopped taking due to the side effect. She was advised by a pharmacist to take two laxatives a day, which resulted in diarrhea, but she has since stopped the laxatives and reports normal bowel movements.  She is scheduled for surgery on September 9th for a cervical polyp. She has been evaluated at [facility], where she underwent several tests and was recommended for day surgery. She denies any vaginal bleeding.  No dizziness, chest pain, shortness of breath, swelling in her legs or feet, nausea, vomiting, or diarrhea.    Outpatient Medications Prior to Visit  Medication Sig Dispense Refill   Accu-Chek FastClix Lancets MISC USE THREE TIMES DAILY 306 each 1   acetaminophen  (TYLENOL ) 500 MG tablet Take 1,000 mg by mouth every 6 (six) hours as needed for mild pain.     atorvastatin  (LIPITOR) 80 MG tablet Take 1 tablet (80 mg total) by mouth daily. 90 tablet 1   Blood Glucose Monitoring Suppl (ACCU-CHEK AVIVA PLUS) w/Device KIT Use to check blood sugars daily Dx E11.9 1 kit 0   carvedilol  (COREG ) 12.5 MG tablet Take 1 tablet (12.5 mg total) by mouth 2 (two) times daily with a  meal. 180 tablet 0   cetirizine (ZYRTEC) 10 MG tablet Take 10 mg by mouth daily.     Cholecalciferol  2000 units TABS Take 1 tablet (2,000 Units total) by mouth daily. 90 tablet 1   Cyanocobalamin (VITAMIN B-12 CR PO) Take 1 tablet by mouth daily.     docusate sodium (COLACE) 50 MG capsule Take 50 mg by mouth daily.     empagliflozin  (JARDIANCE ) 25 MG TABS tablet TAKE 1 TABLET EVERY DAY BEFORE BREAKFAST 90 tablet 0   glucose blood (ACCU-CHEK GUIDE TEST) test strip Check blood sugars up to TID 300 each 3   indapamide  (LOZOL ) 1.25 MG tablet Take 1 tablet (1.25 mg total) by mouth daily. 90 tablet 1   insulin  glargine (LANTUS  SOLOSTAR) 100 UNIT/ML Solostar Pen Inject 50 Units into the skin daily.     Insulin  Pen Needle (DROPLET PEN NEEDLES) 32G X 6 MM MISC Inject 1 Needle into the skin daily. 100 each 0   olmesartan  (BENICAR ) 20 MG tablet TAKE 1 TABLET EVERY DAY 90 tablet 3   polyethylene glycol powder (GLYCOLAX/MIRALAX) 17 GM/SCOOP powder Take 119 g by mouth daily.     triamcinolone  cream (KENALOG ) 0.1 % Apply 1 Application topically 2 (two) times daily. 80 g 2   Finerenone  (KERENDIA ) 20 MG TABS Take 1 tablet (20 mg total) by mouth daily. [employer] Patient Assistance Medication 90 tablet 1   Glucagon  (GVOKE HYPOPEN  2-PACK) 1 MG/0.2ML SOAJ Inject 1 Act into the skin  daily as needed. 2 mL 5   No facility-administered medications prior to visit.    ROS Review of Systems  Constitutional:  Negative for appetite change, chills, diaphoresis, fatigue and fever.  HENT: Negative.    Respiratory: Negative.  Negative for cough, chest tightness, shortness of breath and wheezing.   Cardiovascular:  Negative for chest pain, palpitations and leg swelling.  Gastrointestinal: Negative.  Negative for abdominal pain, constipation, diarrhea, nausea and vomiting.  Genitourinary: Negative.  Negative for difficulty urinating, dysuria, hematuria and urgency.  Musculoskeletal:  Positive for arthralgias. Negative for  myalgias.  Skin: Negative.   Neurological: Negative.  Negative for dizziness and weakness.  Hematological:  Negative for adenopathy. Does not bruise/bleed easily.  Psychiatric/Behavioral: Negative.      Objective:  BP 138/78 (BP Location: Left Arm, Patient Position: Sitting, Cuff Size: Large)   Pulse 67   Temp 98.1 F (36.7 C) (Oral)   Resp 16   Ht 5' 8.5 (1.74 m)   Wt (!) 303 lb (137.4 kg)   SpO2 98%   BMI 45.40 kg/m   BP Readings from Last 3 Encounters:  06/27/24 138/78  03/20/24 130/82  02/05/24 127/73    Wt Readings from Last 3 Encounters:  06/27/24 (!) 303 lb (137.4 kg)  03/27/24 (!) 302 lb 12.8 oz (137.3 kg)  03/20/24 (!) 303 lb (137.4 kg)    Physical Exam Vitals reviewed.  HENT:     Nose: Nose normal.     Mouth/Throat:     Mouth: Mucous membranes are moist.  Eyes:     General: No scleral icterus.    Conjunctiva/sclera: Conjunctivae normal.  Cardiovascular:     Rate and Rhythm: Normal rate and regular rhythm.     Heart sounds: No murmur heard.    No friction rub. No gallop.  Pulmonary:     Effort: Pulmonary effort is normal.     Breath sounds: No stridor. No wheezing, rhonchi or rales.  Abdominal:     General: Abdomen is protuberant. Bowel sounds are normal. There is no distension.     Palpations: Abdomen is soft. There is no hepatomegaly, splenomegaly or mass.     Tenderness: There is no abdominal tenderness. There is no guarding.  Musculoskeletal:        General: Normal range of motion.     Cervical back: Neck supple.     Right lower leg: No edema.     Left lower leg: No edema.  Lymphadenopathy:     Cervical: No cervical adenopathy.  Skin:    General: Skin is warm.  Neurological:     General: No focal deficit present.     Mental Status: She is alert.  Psychiatric:        Mood and Affect: Mood normal.        Behavior: Behavior normal.     Lab Results  Component Value Date   WBC 7.5 12/28/2023   HGB 14.8 12/28/2023   HCT 46.5 (H)  12/28/2023   PLT 215.0 12/28/2023   GLUCOSE 183 (H) 06/27/2024   CHOL 182 12/28/2023   TRIG 84.0 12/28/2023   HDL 74.30 12/28/2023   LDLDIRECT 134.8 08/27/2010   LDLCALC 91 12/28/2023   ALT 15 12/28/2023   AST 20 12/28/2023   NA 134 (L) 06/27/2024   K 3.8 06/27/2024   CL 99 06/27/2024   CREATININE 1.37 (H) 06/27/2024   BUN 28 (H) 06/27/2024   CO2 26 06/27/2024   TSH 2.97 12/28/2023   HGBA1C 7.9 (H)  06/27/2024   MICROALBUR 22.4 (H) 06/27/2024    US  PELVIC COMPLETE WITH TRANSVAGINAL Result Date: 02/28/2024 CLINICAL DATA:  Microscopic hematuria.  Uterine fibroids. EXAM: TRANSABDOMINAL AND TRANSVAGINAL ULTRASOUND OF PELVIS TECHNIQUE: Both transabdominal and transvaginal ultrasound examinations of the pelvis were performed. Transabdominal technique was performed for global imaging of the pelvis including uterus, ovaries, adnexal regions, and pelvic cul-de-sac. It was necessary to proceed with endovaginal exam following the transabdominal exam to visualize the uterus, endometrium and ovaries. COMPARISON:  Mar 27, 2007 FINDINGS: Uterus Measurements: 7.4 x 3.7 x 4.5 cm = volume: 66.9 mL. No fibroids or other mass visualized. Endometrium Thickness: 15 mm.  No focal abnormality visualized. Right ovary Not visualized. Left ovary Not visualized. Other findings No abnormal free fluid. 0.6 cm nabothian in cyst are identified in the cervix. IMPRESSION: 1. No acute abnormality identified. 2. Endometrium is thickened measuring 15 mm. In the setting of post-menopausal bleeding, endometrial sampling is indicated to exclude carcinoma. If results are benign, sonohysterogram should be considered for focal lesion work-up. (Ref: Radiological Reasoning: Algorithmic Workup of Abnormal Vaginal Bleeding with Endovaginal Sonography and Sonohysterography. AJR 2008; 808:D31-26) Electronically Signed   By: Craig Farr M.D.   On: 02/28/2024 15:31    Assessment & Plan:   Stage 3a chronic kidney disease (HCC)- Will avoid  nephrotoxic agents  -     Basic metabolic panel with GFR; Future -     Microalbumin / creatinine urine ratio; Future -     Urinalysis, Routine w reflex microscopic; Future -     CBC with Differential/Platelet; Future  Type II diabetes mellitus with manifestations (HCC)- Blood sugar is adequately well controlled. -     Basic metabolic panel with GFR; Future -     Hemoglobin A1c; Future -     Microalbumin / creatinine urine ratio; Future -     Urinalysis, Routine w reflex microscopic; Future  Diabetic nephropathy associated with type 2 diabetes mellitus (HCC)  Type 2 diabetes mellitus with stage 3a chronic kidney disease, without long-term current use of insulin  (HCC) -     Kerendia ; Take 1 tablet (20 mg total) by mouth daily. [employer] Patient Assistance Medication  Dispense: 90 tablet; Refill: 1  Insulin -requiring or dependent type II diabetes mellitus (HCC) -     Gvoke HypoPen  2-Pack; Inject 1 Act into the skin daily as needed.  Dispense: 2 mL; Refill: 5     Follow-up: Return in about 6 months (around 12/28/2024).  Debby Molt, MD

## 2024-06-27 NOTE — Patient Instructions (Signed)

## 2024-06-30 ENCOUNTER — Ambulatory Visit: Payer: Self-pay | Admitting: Internal Medicine

## 2024-07-01 DIAGNOSIS — N1831 Chronic kidney disease, stage 3a: Secondary | ICD-10-CM | POA: Insufficient documentation

## 2024-07-01 MED ORDER — GVOKE HYPOPEN 2-PACK 1 MG/0.2ML ~~LOC~~ SOAJ: 1.0000 | Freq: Every day | SUBCUTANEOUS | 5 refills | Status: DC | PRN

## 2024-07-01 MED ORDER — KERENDIA 20 MG PO TABS: 1.0000 | ORAL_TABLET | Freq: Every day | ORAL | 1 refills | Status: DC

## 2024-07-04 ENCOUNTER — Other Ambulatory Visit (HOSPITAL_BASED_OUTPATIENT_CLINIC_OR_DEPARTMENT_OTHER): Payer: Self-pay | Admitting: Obstetrics & Gynecology

## 2024-07-04 DIAGNOSIS — R935 Abnormal findings on diagnostic imaging of other abdominal regions, including retroperitoneum: Secondary | ICD-10-CM

## 2024-07-04 DIAGNOSIS — N950 Postmenopausal bleeding: Secondary | ICD-10-CM

## 2024-07-08 ENCOUNTER — Other Ambulatory Visit: Payer: Self-pay | Admitting: Internal Medicine

## 2024-07-08 DIAGNOSIS — E119 Type 2 diabetes mellitus without complications: Secondary | ICD-10-CM

## 2024-07-09 ENCOUNTER — Encounter (HOSPITAL_COMMUNITY): Payer: Self-pay

## 2024-07-09 ENCOUNTER — Inpatient Hospital Stay (HOSPITAL_COMMUNITY): Admit: 2024-07-09 | Admitting: Family Medicine

## 2024-07-09 SURGERY — Surgical Case
Anesthesia: Regional

## 2024-07-16 ENCOUNTER — Telehealth: Payer: Self-pay

## 2024-07-16 NOTE — Telephone Encounter (Signed)
 Copied from CRM [id number]. Topic: Clinical - Prescription Issue >> Jul 16, 2024 10:04 AM Chiquita SQUIBB wrote: Reason for CRM: Don from [facility] is calling in regarding the GVOKE HYPOPEN  2-PACK 1 MG/0.2ML SOAJ [[id number]] not being covered by insurance. She did stated they have an alterative  which is Zegallgue .6 MG. Fax number is [phone] and a good phone number for questions is [phone].

## 2024-07-17 ENCOUNTER — Other Ambulatory Visit: Payer: Self-pay | Admitting: Internal Medicine

## 2024-07-17 DIAGNOSIS — I10 Essential (primary) hypertension: Secondary | ICD-10-CM

## 2024-07-18 ENCOUNTER — Encounter (HOSPITAL_COMMUNITY): Payer: Self-pay | Admitting: Obstetrics & Gynecology

## 2024-07-18 NOTE — Telephone Encounter (Signed)
**Note De-identified  [place] Obfuscation** Please advise 

## 2024-07-18 NOTE — Progress Notes (Addendum)
 Spoke w/ via phone for pre-op interview--- [facility] needs dos---- BMP, and CBG per anesthesia.        Lab results------ Current EKG in Epic dated 12/28/23. Current A1C 7.9 dated 06/27/24 COVID test -----patient states asymptomatic no test needed Arrive at -------0530 NPO after MN NO Solid Food.   Pre-Surgery Ensure or G2:  Med rec completed Medications to take morning of surgery ----- Coreg  Diabetic medication ----- NONE AM of surgery. Hold Jardiance  3 days prior pt verbalized understanding of these instructions.  GLP1 agonist last dose: GLP1 instructions:  Patient instructed no nail polish to be worn day of surgery Patient instructed to bring photo id and insurance card day of surgery Patient aware to have Driver (ride ) / caregiver    for 24 hours after surgery - Son Rena Hunke Patient Special Instructions ----- per surgeon instructions hold ASA 5 days prior to procedure. Pre-Op special Instructions -----NS IV fluid, pt has history of CKD.  Patient verbalized understanding of instructions that were given at this phone interview. Patient denies chest pain, sob, fever, cough at the interview.

## 2024-07-22 ENCOUNTER — Other Ambulatory Visit: Payer: Self-pay | Admitting: Internal Medicine

## 2024-07-22 DIAGNOSIS — E1122 Type 2 diabetes mellitus with diabetic chronic kidney disease: Secondary | ICD-10-CM

## 2024-07-22 MED ORDER — ZEGALOGUE 0.6 MG/0.6ML ~~LOC~~ SOAJ
1.0000 | Freq: Every day | SUBCUTANEOUS | 5 refills | Status: AC | PRN
Start: 2024-07-22 — End: ?

## 2024-07-23 ENCOUNTER — Encounter (HOSPITAL_COMMUNITY)
Admission: RE | Disposition: A | Discharge status: Home / Self Care | Payer: Self-pay | Source: Home / Self Care | Attending: Obstetrics & Gynecology

## 2024-07-23 ENCOUNTER — Ambulatory Visit (HOSPITAL_COMMUNITY)

## 2024-07-23 ENCOUNTER — Encounter (HOSPITAL_COMMUNITY): Payer: Self-pay | Admitting: Obstetrics & Gynecology

## 2024-07-23 ENCOUNTER — Other Ambulatory Visit (HOSPITAL_COMMUNITY): Payer: Self-pay

## 2024-07-23 ENCOUNTER — Ambulatory Visit (HOSPITAL_COMMUNITY)
Admission: RE | Admit: 2024-07-23 | Discharge: 2024-07-23 | Disposition: A | Discharge status: Home / Self Care | Attending: Obstetrics & Gynecology | Admitting: Obstetrics & Gynecology

## 2024-07-23 ENCOUNTER — Other Ambulatory Visit: Payer: Self-pay

## 2024-07-23 DIAGNOSIS — Z87891 Personal history of nicotine dependence: Secondary | ICD-10-CM | POA: Insufficient documentation

## 2024-07-23 DIAGNOSIS — N950 Postmenopausal bleeding: Secondary | ICD-10-CM

## 2024-07-23 DIAGNOSIS — G4733 Obstructive sleep apnea (adult) (pediatric): Secondary | ICD-10-CM

## 2024-07-23 DIAGNOSIS — Z780 Asymptomatic menopausal state: Secondary | ICD-10-CM | POA: Diagnosis not present

## 2024-07-23 DIAGNOSIS — E1151 Type 2 diabetes mellitus with diabetic peripheral angiopathy without gangrene: Secondary | ICD-10-CM | POA: Diagnosis not present

## 2024-07-23 DIAGNOSIS — I10 Essential (primary) hypertension: Secondary | ICD-10-CM | POA: Diagnosis not present

## 2024-07-23 DIAGNOSIS — Z6841 Body Mass Index (BMI) 40.0 and over, adult: Secondary | ICD-10-CM | POA: Insufficient documentation

## 2024-07-23 DIAGNOSIS — N840 Polyp of corpus uteri: Secondary | ICD-10-CM | POA: Diagnosis not present

## 2024-07-23 DIAGNOSIS — Z794 Long term (current) use of insulin: Secondary | ICD-10-CM | POA: Diagnosis not present

## 2024-07-23 DIAGNOSIS — E66813 Obesity, class 3: Secondary | ICD-10-CM | POA: Insufficient documentation

## 2024-07-23 DIAGNOSIS — R935 Abnormal findings on diagnostic imaging of other abdominal regions, including retroperitoneum: Secondary | ICD-10-CM

## 2024-07-23 DIAGNOSIS — I129 Hypertensive chronic kidney disease with stage 1 through stage 4 chronic kidney disease, or unspecified chronic kidney disease: Secondary | ICD-10-CM | POA: Diagnosis not present

## 2024-07-23 DIAGNOSIS — R9389 Abnormal findings on diagnostic imaging of other specified body structures: Secondary | ICD-10-CM | POA: Diagnosis not present

## 2024-07-23 DIAGNOSIS — Z7722 Contact with and (suspected) exposure to environmental tobacco smoke (acute) (chronic): Secondary | ICD-10-CM | POA: Diagnosis not present

## 2024-07-23 DIAGNOSIS — Z79899 Other long term (current) drug therapy: Secondary | ICD-10-CM | POA: Insufficient documentation

## 2024-07-23 DIAGNOSIS — N189 Chronic kidney disease, unspecified: Secondary | ICD-10-CM | POA: Diagnosis not present

## 2024-07-23 DIAGNOSIS — N1831 Chronic kidney disease, stage 3a: Secondary | ICD-10-CM | POA: Diagnosis not present

## 2024-07-23 DIAGNOSIS — E1122 Type 2 diabetes mellitus with diabetic chronic kidney disease: Secondary | ICD-10-CM | POA: Diagnosis not present

## 2024-07-23 DIAGNOSIS — Z7984 Long term (current) use of oral hypoglycemic drugs: Secondary | ICD-10-CM | POA: Insufficient documentation

## 2024-07-23 HISTORY — PX: HYSTEROSCOPY WITH D & C: SHX1775

## 2024-07-23 LAB — BASIC METABOLIC PANEL WITH GFR
Anion gap: 14 (ref 5–15)
BUN: 32 mg/dL — ABNORMAL HIGH (ref 8–23)
CO2: 21 mmol/L — ABNORMAL LOW (ref 22–32)
Calcium: 9.6 mg/dL (ref 8.9–10.3)
Chloride: 102 mmol/L (ref 98–111)
Creatinine, Ser: 1.34 mg/dL — ABNORMAL HIGH (ref 0.44–1.00)
GFR, Estimated: 40 mL/min — ABNORMAL LOW (ref >60)
Glucose, Bld: 224 mg/dL — ABNORMAL HIGH (ref 70–99)
Potassium: 3.7 mmol/L (ref 3.5–5.1)
Sodium: 137 mmol/L (ref 135–145)

## 2024-07-23 LAB — GLUCOSE, CAPILLARY
Glucose-Capillary: 197 mg/dL — ABNORMAL HIGH (ref 70–99)
Glucose-Capillary: 212 mg/dL — ABNORMAL HIGH (ref 70–99)

## 2024-07-23 LAB — CBC
HCT: 45 % (ref 36.0–46.0)
Hemoglobin: 14.6 g/dL (ref 12.0–15.0)
MCH: 27.8 pg (ref 26.0–34.0)
MCHC: 32.4 g/dL (ref 30.0–36.0)
MCV: 85.6 fL (ref 80.0–100.0)
Platelets: 177 K/uL (ref 150–400)
RBC: 5.26 MIL/uL — ABNORMAL HIGH (ref 3.87–5.11)
RDW: 13.4 % (ref 11.5–15.5)
WBC: 5.7 K/uL (ref 4.0–10.5)
nRBC: 0 % (ref 0.0–0.2)

## 2024-07-23 SURGERY — DILATATION AND CURETTAGE /HYSTEROSCOPY
Anesthesia: General

## 2024-07-23 MED ORDER — SODIUM CHLORIDE 0.9 % IV SOLN: INTRAVENOUS | Status: DC

## 2024-07-23 MED ORDER — ALBUTEROL SULFATE HFA 108 (90 BASE) MCG/ACT IN AERS
INHALATION_SPRAY | RESPIRATORY_TRACT | Status: AC
  Filled 2024-07-23: qty 6.7

## 2024-07-23 MED ORDER — POVIDONE-IODINE 10 % EX SWAB: 2.0000 | Freq: Once | CUTANEOUS | Status: DC

## 2024-07-23 MED ORDER — FENTANYL CITRATE (PF) 100 MCG/2ML IJ SOLN: 25.0000 ug | INTRAMUSCULAR | Status: DC | PRN

## 2024-07-23 MED ORDER — SODIUM CHLORIDE 0.9 % IR SOLN
Status: DC | PRN
  Administered 2024-07-23: 3000 mL

## 2024-07-23 MED ORDER — ROCURONIUM BROMIDE 10 MG/ML (PF) SYRINGE
PREFILLED_SYRINGE | INTRAVENOUS | Status: DC | PRN
  Administered 2024-07-23: 50 mg via INTRAVENOUS

## 2024-07-23 MED ORDER — ALBUTEROL SULFATE HFA 108 (90 BASE) MCG/ACT IN AERS
INHALATION_SPRAY | RESPIRATORY_TRACT | Status: DC | PRN
  Administered 2024-07-23: 2 via RESPIRATORY_TRACT

## 2024-07-23 MED ORDER — LIDOCAINE 2% (20 MG/ML) 5 ML SYRINGE
INTRAMUSCULAR | Status: AC
  Filled 2024-07-23: qty 5

## 2024-07-23 MED ORDER — ONDANSETRON HCL 4 MG/2ML IJ SOLN
INTRAMUSCULAR | Status: AC
  Filled 2024-07-23: qty 2

## 2024-07-23 MED ORDER — ONDANSETRON HCL 4 MG/2ML IJ SOLN: 4.0000 mg | Freq: Once | INTRAMUSCULAR | Status: DC | PRN

## 2024-07-23 MED ORDER — PHENYLEPHRINE 80 MCG/ML (10ML) SYRINGE FOR IV PUSH (FOR BLOOD PRESSURE SUPPORT)
PREFILLED_SYRINGE | INTRAVENOUS | Status: AC
  Filled 2024-07-23: qty 10

## 2024-07-23 MED ORDER — HYDROCODONE-ACETAMINOPHEN 5-325 MG PO TABS
1.0000 | ORAL_TABLET | Freq: Four times a day (QID) | ORAL | 0 refills | Status: DC | PRN
  Filled 2024-07-23: qty 12, 3d supply, fill #0

## 2024-07-23 MED ORDER — INSULIN ASPART 100 UNIT/ML IJ SOLN: 0.0000 [IU] | INTRAMUSCULAR | Status: DC | PRN

## 2024-07-23 MED ORDER — CHLORHEXIDINE GLUCONATE 0.12 % MT SOLN
OROMUCOSAL | Status: AC
  Filled 2024-07-23: qty 15

## 2024-07-23 MED ORDER — FENTANYL CITRATE (PF) 250 MCG/5ML IJ SOLN
INTRAMUSCULAR | Status: DC | PRN
  Administered 2024-07-23: 100 ug via INTRAVENOUS

## 2024-07-23 MED ORDER — PROPOFOL 10 MG/ML IV BOLUS
INTRAVENOUS | Status: DC | PRN
  Administered 2024-07-23: 120 mg via INTRAVENOUS

## 2024-07-23 MED ORDER — ACETAMINOPHEN 500 MG PO TABS
1000.0000 mg | ORAL_TABLET | ORAL | Status: AC
  Administered 2024-07-23: 1000 mg via ORAL

## 2024-07-23 MED ORDER — ACETAMINOPHEN 500 MG PO TABS
ORAL_TABLET | ORAL | Status: AC
  Filled 2024-07-23: qty 2

## 2024-07-23 MED ORDER — DEXAMETHASONE SODIUM PHOSPHATE 10 MG/ML IJ SOLN
INTRAMUSCULAR | Status: AC
  Filled 2024-07-23: qty 1

## 2024-07-23 MED ORDER — SUGAMMADEX SODIUM 200 MG/2ML IV SOLN
INTRAVENOUS | Status: DC | PRN
  Administered 2024-07-23 (×2): 200 mg via INTRAVENOUS

## 2024-07-23 MED ORDER — LIDOCAINE 2% (20 MG/ML) 5 ML SYRINGE
INTRAMUSCULAR | Status: DC | PRN
  Administered 2024-07-23: 80 mg via INTRAVENOUS

## 2024-07-23 MED ORDER — ROCURONIUM BROMIDE 10 MG/ML (PF) SYRINGE
PREFILLED_SYRINGE | INTRAVENOUS | Status: AC
  Filled 2024-07-23: qty 10

## 2024-07-23 MED ORDER — ONDANSETRON HCL 4 MG/2ML IJ SOLN
INTRAMUSCULAR | Status: DC | PRN
  Administered 2024-07-23: 4 mg via INTRAVENOUS

## 2024-07-23 MED ORDER — LIDOCAINE-EPINEPHRINE 1 %-1:100000 IJ SOLN
INTRAMUSCULAR | Status: AC
  Filled 2024-07-23: qty 1

## 2024-07-23 MED ORDER — FENTANYL CITRATE (PF) 250 MCG/5ML IJ SOLN
INTRAMUSCULAR | Status: AC
  Filled 2024-07-23: qty 5

## 2024-07-23 MED ORDER — ORAL CARE MOUTH RINSE: 15.0000 mL | Freq: Once | OROMUCOSAL | Status: AC

## 2024-07-23 MED ORDER — PROPOFOL 10 MG/ML IV BOLUS
INTRAVENOUS | Status: AC
  Filled 2024-07-23: qty 20

## 2024-07-23 MED ORDER — CHLORHEXIDINE GLUCONATE 0.12 % MT SOLN
15.0000 mL | Freq: Once | OROMUCOSAL | Status: AC
  Administered 2024-07-23: 15 mL via OROMUCOSAL

## 2024-07-23 MED ORDER — DEXAMETHASONE SODIUM PHOSPHATE 10 MG/ML IJ SOLN
INTRAMUSCULAR | Status: DC | PRN
  Administered 2024-07-23: 5 mg via INTRAVENOUS

## 2024-07-23 MED ORDER — LIDOCAINE-EPINEPHRINE 1 %-1:100000 IJ SOLN
INTRAMUSCULAR | Status: DC | PRN
  Administered 2024-07-23: 10 mL

## 2024-07-23 MED ORDER — PHENYLEPHRINE 80 MCG/ML (10ML) SYRINGE FOR IV PUSH (FOR BLOOD PRESSURE SUPPORT)
PREFILLED_SYRINGE | INTRAVENOUS | Status: DC | PRN
  Administered 2024-07-23: 80 ug via INTRAVENOUS

## 2024-07-23 SURGICAL SUPPLY — 16 items
CANISTER SUCTION 3000ML PPV (SUCTIONS) ×1 IMPLANT
CATH ROBINSON RED A/P 16FR (CATHETERS) ×1 IMPLANT
COVER MAYO STAND STRL (DRAPES) ×1 IMPLANT
DEVICE MYOSURE LITE (MISCELLANEOUS) IMPLANT
DEVICE MYOSURE REACH (MISCELLANEOUS) IMPLANT
DILATOR CANAL MILEX (MISCELLANEOUS) IMPLANT
GLOVE ECLIPSE 6.5 STRL STRAW (GLOVE) ×1 IMPLANT
GLOVE SURG UNDER POLY LF SZ7 (GLOVE) ×2 IMPLANT
GOWN STRL REUS W/ TWL LRG LVL3 (GOWN DISPOSABLE) ×2 IMPLANT
KIT PROCEDURE FLUENT (KITS) ×1 IMPLANT
KIT TURNOVER KIT B (KITS) ×1 IMPLANT
PACK VAGINAL MINOR WOMEN LF (CUSTOM PROCEDURE TRAY) ×1 IMPLANT
PAD OB MATERNITY 11 LF (PERSONAL CARE ITEMS) ×1 IMPLANT
SEAL ROD LENS SCOPE MYOSURE (ABLATOR) ×1 IMPLANT
TOWEL GREEN STERILE FF (TOWEL DISPOSABLE) ×2 IMPLANT
UNDERPAD 30X36 HEAVY ABSORB (UNDERPADS AND DIAPERS) ×1 IMPLANT

## 2024-07-23 NOTE — Discharge Instructions (Addendum)
 Post-surgical Instructions, Outpatient Surgery  You may expect to feel dizzy, weak, and drowsy for as long as 24 hours after receiving the medicine that made you sleep (anesthetic). For the first 24 hours after your surgery:   Do not drive a car, ride a bicycle, participate in physical activities, or take public transportation until you are done taking narcotic pain medicines or as directed by Dr. Cleotilde.  Do not drink alcohol or take tranquilizers.  Do not take medicine that has not been prescribed by your physicians.  Do not sign important papers or make important decisions while on narcotic pain medicines.  Have a responsible person with you.   PAIN MANAGEMENT Vicodin 5/325mg .  For more severe pain, take one or two tablets every four to six hours as needed for pain control.  (Remember that narcotic pain medications increase your risk of constipation.  If this becomes a problem, you may take an over the counter stool softener like Colace 100mg  up to four times a day.) If you feel you need something for cramping but not the narcotic pain medication, you can take 2 Tylenol  every 6 hours.    DO'S AND DON'T'S Do not take a tub bath for one week.  You may shower on the first day after your surgery Do not do any heavy lifting for one to two weeks.  This increases the chance of bleeding. Do move around as you feel able.  Stairs are fine.  You may begin to exercise again as you feel able.  Do not lift any weights for two weeks. Do not put anything in the vagina for two weeks--no tampons, intercourse, or douching.    REGULAR MEDIATIONS/VITAMINS: You may restart all of your regular medications as prescribed. You may restart all of your vitamins as you normally take them.    PLEASE CALL OR SEEK MEDICAL CARE IF: You have persistent nausea and vomiting.  You have trouble eating or drinking.  You have an oral temperature above 100.5.  You have constipation that is not helped by adjusting diet or  increasing fluid intake. Pain medicines are a common cause of constipation.  You have heavy vaginal bleeding    Post Anesthesia Home Care Instructions  Activity: Get plenty of rest for the remainder of the day. A responsible individual must stay with you for 24 hours following the procedure.  For the next 24 hours, DO NOT: -Drive a car -Advertising copywriter -Drink alcoholic beverages -Take any medication unless instructed by your physician -Make any legal decisions or sign important papers.  Meals: Start with liquid foods such as gelatin or soup. Progress to regular foods as tolerated. Avoid greasy, spicy, heavy foods. If nausea and/or vomiting occur, drink only clear liquids until the nausea and/or vomiting subsides. Call your physician if vomiting continues.  Special Instructions/Symptoms: Your throat may feel dry or sore from the anesthesia or the breathing tube placed in your throat during surgery. If this causes discomfort, gargle with warm salt water. The discomfort should disappear within 24 hours.  If you had a scopolamine patch placed behind your ear for the management of post- operative nausea and/or vomiting:  1. The medication in the patch is effective for 72 hours, after which it should be removed.  Wrap patch in a tissue and discard in the trash. Wash hands thoroughly with soap and water. 2. You may remove the patch earlier than 72 hours if you experience unpleasant side effects which may include dry mouth, dizziness or visual disturbances.  3. Avoid touching the patch. Wash your hands with soap and water after contact with the patch.

## 2024-07-23 NOTE — Anesthesia Postprocedure Evaluation (Signed)
 Anesthesia Post Note  Patient: Holly [name]  Procedure(s) Performed: DILATATION AND CURETTAGE /HYSTEROSCOPY WITH MYOSURE AND [place]     Patient location during evaluation: PACU Anesthesia Type: General Level of consciousness: awake and alert Pain management: pain level controlled Vital Signs Assessment: post-procedure vital signs reviewed and stable Respiratory status: spontaneous breathing, nonlabored ventilation, respiratory function stable and patient connected to nasal cannula oxygen Cardiovascular status: blood pressure returned to baseline and stable Postop Assessment: no apparent nausea or vomiting Anesthetic complications: no   No notable events documented.  Last Vitals:  Vitals:   07/23/24 0900 07/23/24 0906  BP: 136/77 (!) 140/77  Pulse: 64 66  Resp: 13 16  Temp:  36.4 C  SpO2: 97% 96%    Last Pain:  Vitals:   07/23/24 0906  TempSrc:   PainSc: 0-No pain                 Rome Ade

## 2024-07-23 NOTE — H&P (Signed)
 Holly [name] is an 83 y.o. female G2P2 AAfemale with hx of PMP bleeding.  Evaluation was performed with ultrasound showing uterus measuring 6.3 x 3.2 x 4.4cm.  endometrium was thickened at 1.38cm with cystic spaces noted.  Given bleeding and this findings, hysteroscopy with likely polyp removal and D&C recommended.  Risks and benefits were reviewed on 5/14 and reviewed again today.  Given the findings, endometrial sampling or removal would be the recommendations for this and not following conservatively.    Pertinent Gynecological History: Menses: post-menopausal Bleeding: post menopausal bleeding Contraception: post menopausal status DES exposure: denies Blood transfusions: none Sexually transmitted diseases: no past history Previous GYN Procedures: tubal ligations  Last mammogram: normal Date: 04/2023 Last pap: normal Date: 05/2020 OB History: G2, P2   Menstrual History: No LMP recorded. Patient is postmenopausal.    Past Medical History:  Diagnosis Date   Closed dislocation of shoulder    unspecified site   DJD (degenerative joint disease) of knee    Right knee   DM (diabetes mellitus) (HCC)    Hyperlipidemia    Hypertension    Obesity, morbid (HCC)    OSA (obstructive sleep apnea) 02/03/2017    Past Surgical History:  Procedure Laterality Date   CHOLECYSTECTOMY  1989   KNEE ARTHROSCOPY  2010   LEFT/ Dr Harden   TUBAL LIGATION  1979    Family History  Problem Relation Age of Onset   Heart disease Mother    Kidney disease Father    Aortic aneurysm Brother        Survived rupture    Social History:  reports that she has quit smoking. Her smoking use included cigarettes. She has a 12.5 pack-year smoking history. She has been exposed to tobacco smoke. She has never used smokeless tobacco. She reports that she does not drink alcohol and does not use drugs.  Allergies:  Allergies  Allergen Reactions   Nifedipine  Other (See Comments)    Leg edema   Amlodipine  Other  (See Comments)    edema   Oxycodone      nausea   Penicillins Hives and Other (See Comments)    fever    Medications Prior to Admission  Medication Sig Dispense Refill Last Dose/Taking   Accu-Chek FastClix Lancets MISC USE THREE TIMES DAILY 306 each 1 07/22/2024   acetaminophen  (TYLENOL ) 500 MG tablet Take 1,000 mg by mouth every 6 (six) hours as needed for mild pain.   Past Week   atorvastatin  (LIPITOR) 80 MG tablet Take 1 tablet (80 mg total) by mouth daily. 90 tablet 1 07/22/2024   Blood Glucose Monitoring Suppl (ACCU-CHEK AVIVA PLUS) w/Device KIT Use to check blood sugars daily Dx E11.9 1 kit 0 07/22/2024   carvedilol  (COREG ) 12.5 MG tablet Take 1 tablet (12.5 mg total) by mouth 2 (two) times daily with a meal. 180 tablet 0 07/23/2024 at  4:30 AM   Cholecalciferol  2000 units TABS Take 1 tablet (2,000 Units total) by mouth daily. 90 tablet 1 07/22/2024   Cyanocobalamin (VITAMIN B-12 CR PO) Take 1 tablet by mouth daily.   07/22/2024   Dasiglucagon  HCl (ZEGALOGUE ) 0.6 MG/0.6ML SOAJ Inject 1 Act into the skin daily as needed. 0.6 mL 5 07/22/2024   docusate sodium (COLACE) 50 MG capsule Take 50 mg by mouth daily.   Past Month   empagliflozin  (JARDIANCE ) 25 MG TABS tablet TAKE 1 TABLET EVERY DAY BEFORE BREAKFAST 90 tablet 0 07/19/2024   Finerenone  (KERENDIA ) 20 MG TABS Take 1 tablet (20  mg total) by mouth daily. [employer] Patient Assistance Medication 90 tablet 1 07/22/2024   glucose blood (ACCU-CHEK GUIDE TEST) test strip Check blood sugars up to TID 300 each 3 07/22/2024   indapamide  (LOZOL ) 1.25 MG tablet Take 1 tablet (1.25 mg total) by mouth daily. 90 tablet 1 07/22/2024   insulin  glargine (LANTUS  SOLOSTAR) 100 UNIT/ML Solostar Pen Inject 50 Units into the skin daily.   07/22/2024   Insulin  Pen Needle (DROPLET PEN NEEDLES) 32G X 6 MM MISC Inject 1 Needle into the skin daily. 100 each 0 07/22/2024   olmesartan  (BENICAR ) 20 MG tablet TAKE 1 TABLET EVERY DAY 90 tablet 3 07/22/2024   polyethylene glycol powder  (GLYCOLAX/MIRALAX) 17 GM/SCOOP powder Take 119 g by mouth daily.   Past Month   triamcinolone  cream (KENALOG ) 0.1 % Apply 1 Application topically 2 (two) times daily. 80 g 2 07/22/2024   cetirizine (ZYRTEC) 10 MG tablet Take 10 mg by mouth daily.   More than a month    Review of Systems  Constitutional: Negative.   Respiratory: Negative.    Cardiovascular: Negative.     Blood pressure (!) 140/77, pulse 70, temperature 97.8 F (36.6 C), temperature source Oral, resp. rate 18, height 5' 9 (1.753 m), weight (!) 137.4 kg, SpO2 98%. Physical Exam Constitutional:      Appearance: Normal appearance.  Cardiovascular:     Rate and Rhythm: Normal rate and regular rhythm.  Pulmonary:     Effort: Pulmonary effort is normal.     Breath sounds: Normal breath sounds.  Neurological:     General: No focal deficit present.     Mental Status: She is alert.  Psychiatric:        Mood and Affect: Mood normal.        Behavior: Behavior normal.     Results for orders placed or performed during the hospital encounter of 07/23/24 (from the past 24 hours)  CBC     Status: Abnormal   Collection Time: 07/23/24  6:14 AM  Result Value Ref Range   WBC 5.7 4.0 - 10.5 K/uL   RBC 5.26 (H) 3.87 - 5.11 MIL/uL   Hemoglobin 14.6 12.0 - 15.0 g/dL   HCT 54.9 63.9 - 53.9 %   MCV 85.6 80.0 - 100.0 fL   MCH 27.8 26.0 - 34.0 pg   MCHC 32.4 30.0 - 36.0 g/dL   RDW 86.5 88.4 - 84.4 %   Platelets 177 150 - 400 K/uL   nRBC 0.0 0.0 - 0.2 %  Glucose, capillary     Status: Abnormal   Collection Time: 07/23/24  6:17 AM  Result Value Ref Range   Glucose-Capillary 197 (H) 70 - 99 mg/dL    No results found.  Assessment/Plan: 83 yo G2P2 AAF with PMP bleeding, abnormal appearing endometrium here for hysteroscopy, possible polyp removal, D&C.  Questions answered.  Pt ready to proceed.  [name] 07/23/2024, 6:43 AM

## 2024-07-23 NOTE — Transfer of Care (Signed)
 Immediate Anesthesia Transfer of Care Note  Patient: Holly [name]  Procedure(s) Performed: DILATATION AND CURETTAGE /HYSTEROSCOPY WITH MYOSURE AND Papsmere  Patient Location: PACU  Anesthesia Type:General  Level of Consciousness: awake, alert , oriented, and patient cooperative  Airway & Oxygen Therapy: Patient Spontanous Breathing and Patient connected to nasal cannula oxygen  Post-op Assessment: Report given to RN, Post -op Vital signs reviewed and stable, Patient moving all extremities X 4, and Patient able to stick tongue midline  Post vital signs: Reviewed and stable  Last Vitals:  Vitals Value Taken Time  BP 129/81 07/23/24 08:20  Temp 36.6 C 07/23/24 08:20  Pulse 75 07/23/24 08:21  Resp 16 07/23/24 08:21  SpO2 95 % 07/23/24 08:21  Vitals shown include unfiled device data.  Last Pain:  Vitals:   07/23/24 0602  TempSrc: Oral  PainSc: 0-No pain      Patients Stated Pain Goal: 6 (07/23/24 0602)  Complications: No notable events documented.

## 2024-07-23 NOTE — Anesthesia Preprocedure Evaluation (Signed)
 Anesthesia Evaluation  Patient identified by MRN, date of birth, ID band Patient awake    Reviewed: Allergy & Precautions, NPO status , Patient's Chart, lab work & pertinent test results  History of Anesthesia Complications Negative for: history of anesthetic complications  Airway Mallampati: III  TM Distance: >3 FB Neck ROM: Full    Dental no notable dental hx. (+) Teeth Intact   Pulmonary sleep apnea and Continuous Positive Airway Pressure Ventilation , neg COPD, Patient abstained from smoking.Not current smoker, former smoker   Pulmonary exam normal breath sounds clear to auscultation       Cardiovascular Exercise Tolerance: Good METShypertension, Pt. on medications + Peripheral Vascular Disease  (-) CAD and (-) Past MI (-) dysrhythmias  Rhythm:Regular Rate:Normal - Systolic murmurs    Neuro/Psych negative neurological ROS  negative psych ROS   GI/Hepatic ,neg GERD  ,,(+)     (-) substance abuse    Endo/Other  diabetes, Well Controlled, Insulin  Dependent  Class 3 obesity  Renal/GU CRFRenal disease     Musculoskeletal   Abdominal  (+) + obese  Peds  Hematology   Anesthesia Other Findings Past Medical History: No date: Closed dislocation of shoulder     Comment:  unspecified site No date: DJD (degenerative joint disease) of knee     Comment:  Right knee No date: DM (diabetes mellitus) (HCC) No date: Hyperlipidemia No date: Hypertension No date: Obesity, morbid (HCC) 02/03/2017: OSA (obstructive sleep apnea)  Reproductive/Obstetrics                              Anesthesia Physical Anesthesia Plan  ASA: 3  Anesthesia Plan: General   Post-op Pain Management: Tylenol  PO (pre-op)*   Induction: Intravenous  PONV Risk Score and Plan: 4 or greater and Ondansetron , Dexamethasone  and Treatment may vary due to age or medical condition  Airway Management Planned: Oral  ETT  Additional Equipment: None  Intra-op Plan:   Post-operative Plan: Extubation in OR  Informed Consent: I have reviewed the patients History and Physical, chart, labs and discussed the procedure including the risks, benefits and alternatives for the proposed anesthesia with the patient or authorized representative who has indicated his/her understanding and acceptance.     Dental advisory given  Plan Discussed with: CRNA and Surgeon  Anesthesia Plan Comments: (Discussed risks of anesthesia with patient, including PONV, sore throat, lip/dental/eye damage. Rare risks discussed as well, such as cardiorespiratory and neurological sequelae, and allergic reactions. Discussed the role of CRNA in patient's perioperative care. Patient understands. Patient informed about increased incidence of above perioperative risk due to high BMI. Patient understands.  )        Anesthesia Quick Evaluation

## 2024-07-23 NOTE — Anesthesia Procedure Notes (Signed)
 Procedure Name: Intubation Date/Time: 07/23/2024 7:36 AM  Performed by: Viviana Almarie DASEN, CRNAPre-anesthesia Checklist: Patient identified, Emergency Drugs available, Suction available and Patient being monitored Patient Re-evaluated:Patient Re-evaluated prior to induction Oxygen Delivery Method: Circle System Utilized Preoxygenation: Pre-oxygenation with 100% oxygen Induction Type: IV induction Ventilation: Mask ventilation without difficulty Grade View: Grade I Tube type: Oral Tube size: 6.5 mm Number of attempts: 1 Airway Equipment and Method: Stylet, Oral airway and Bite block Placement Confirmation: ETT inserted through vocal cords under direct vision, positive ETCO2 and breath sounds checked- equal and bilateral Secured at: 21 cm Tube secured with: Tape Dental Injury: Teeth and Oropharynx as per pre-operative assessment

## 2024-07-23 NOTE — Op Note (Signed)
 07/23/2024  8:08 AM  PATIENT:  Holly [name]  83 y.o. female  PRE-OPERATIVE DIAGNOSIS:  PMB Abnormal finding on ultrasound polyp  POST-OPERATIVE DIAGNOSIS:  PMBAbnormal finding on ultrasoundpolyp  PROCEDURE:  Procedure(s): DILATATION AND CURETTAGE /HYSTEROSCOPY WITH MYOSURE AND [place]  SURGEON:  [name]  ASSISTANTS: OR staff  ANESTHESIA:   general  ESTIMATED BLOOD LOSS: 5 mL  BLOOD ADMINISTERED:none   FLUIDS: 800cc LR  UOP: voided before going back to the OR  SPECIMEN:  endometrial polyps and curetting  DISPOSITION OF SPECIMEN:  PATHOLOGY  FINDINGS: two polyps with a possible third that was flat and more erythematous in appearance and flat on the anterior endometrial surface.  All three were resected.  Thin endometrium otherwise.  DESCRIPTION OF OPERATION: Patient was taken to the operating room.  She is placed in the supine position. SCDs were on her lower extremities and functioning properly. General anesthesia was administered without difficulty. Dr. Boone, anesthesia, oversaw case.  Legs were then placed in the [facility] stirrups in the low lithotomy position. The legs were lifted to the high lithotomy position and the Betadine  prep was used on the inner thighs perineum and vagina x3. Patient was draped in a normal standard fashion. A bivalve speculum was placed the vagina. The anterior lip of the cervix was grasped with single-tooth tenaculum.  A paracervical block of 1% lidocaine  mixed one-to-one with epinephrine  (1:100,000 units).  10 cc was used total. The cervix is dilated up to #21 [facility] [place] dilators. The endometrial cavity sounded to 7 cm.   A 5.5 millimeter Myosure hysteroscope was obtained. Normal saline was used as a hysteroscopic fluid. The hysteroscope was advanced through the endocervical canal into the endometrial cavity. The tubal ostia were noted bilaterally. Additional findings included at least two polyps with a possible third, flat anterior polyp as noted  above.  Photo documentation obtained.  Using the Myosure lite device, the polyps were resected until flush with the endometrium.  Polyps were fully resected.  The hysteroscope was removed. A #1 toothed curette was used to curette the cavity until rough gritty texture was noted in all quadrants. With revisualization of the hysteroscope, there was no longer any abnormal findings. At this point no other procedure was needed and this procedure was ended. The hysteroscope was removed. The fluid deficit was 110 cc NS. The tenaculum was removed from the anterior lip of the cervix. The speculum was removed from the vagina. The prep was cleansed of the patient's skin. The legs are positioned back in the supine position. Sponge, lap, needle, instrument counts were correct x2. Patient was taken to recovery in stable condition.  COUNTS:  YES  PLAN OF CARE: Transfer to PACU

## 2024-07-24 ENCOUNTER — Encounter (HOSPITAL_COMMUNITY): Payer: Self-pay | Admitting: Obstetrics & Gynecology

## 2024-07-24 LAB — SURGICAL PATHOLOGY

## 2024-07-25 LAB — CYTOLOGY - PAP: Diagnosis: NEGATIVE

## 2024-07-27 ENCOUNTER — Other Ambulatory Visit: Payer: Self-pay | Admitting: Internal Medicine

## 2024-07-27 DIAGNOSIS — I70211 Atherosclerosis of native arteries of extremities with intermittent claudication, right leg: Secondary | ICD-10-CM

## 2024-07-27 DIAGNOSIS — E785 Hyperlipidemia, unspecified: Secondary | ICD-10-CM

## 2024-08-01 ENCOUNTER — Ambulatory Visit (HOSPITAL_BASED_OUTPATIENT_CLINIC_OR_DEPARTMENT_OTHER): Payer: Self-pay | Admitting: Obstetrics & Gynecology

## 2024-08-13 NOTE — Telephone Encounter (Signed)
 Spoke with patient and advised of results. Patient agreeable to post-op appointment and has no further questions or concerns.   Morna LOISE Quale, RN

## 2024-08-21 ENCOUNTER — Ambulatory Visit (HOSPITAL_BASED_OUTPATIENT_CLINIC_OR_DEPARTMENT_OTHER): Payer: Self-pay | Admitting: Obstetrics & Gynecology

## 2024-08-21 ENCOUNTER — Encounter (HOSPITAL_BASED_OUTPATIENT_CLINIC_OR_DEPARTMENT_OTHER): Payer: Self-pay | Admitting: Obstetrics & Gynecology

## 2024-08-21 VITALS — BP 127/74 | HR 76 | Ht 69.0 in | Wt 300.8 lb

## 2024-08-21 DIAGNOSIS — R935 Abnormal findings on diagnostic imaging of other abdominal regions, including retroperitoneum: Secondary | ICD-10-CM | POA: Diagnosis not present

## 2024-08-21 DIAGNOSIS — N840 Polyp of corpus uteri: Secondary | ICD-10-CM

## 2024-08-21 DIAGNOSIS — Z9889 Other specified postprocedural states: Secondary | ICD-10-CM | POA: Diagnosis not present

## 2024-08-21 NOTE — Progress Notes (Signed)
 GYNECOLOGY  VISIT  CC:   post op recheck  HPI: 83 y.o. G2P2 Divorced Black or [place] American female here for recheck after undergoing Dilatation and Curettage/Hysteroscopy on 07/23/2024.  The first day there was red blood but this gradually eased off and stopped within the first week after the procedure.  Currently, she reports bleeding is none.  She has no pain.  Bowel function is Normal.  Bladder function is normal.    Pathology reviewed:  Yes .  Questions answered.    MEDS:   Current Outpatient Medications on File Prior to Visit  Medication Sig Dispense Refill   Accu-Chek FastClix Lancets MISC USE THREE TIMES DAILY 306 each 1   acetaminophen  (TYLENOL ) 500 MG tablet Take 1,000 mg by mouth every 6 (six) hours as needed for mild pain.     atorvastatin  (LIPITOR) 80 MG tablet TAKE 1 TABLET EVERY DAY 90 tablet 1   Blood Glucose Monitoring Suppl (ACCU-CHEK AVIVA PLUS) w/Device KIT Use to check blood sugars daily Dx E11.9 1 kit 0   carvedilol  (COREG ) 12.5 MG tablet TAKE 1 TABLET TWICE DAILY WITH MEALS 180 tablet 3   cetirizine (ZYRTEC) 10 MG tablet Take 10 mg by mouth daily.     Cholecalciferol  2000 units TABS Take 1 tablet (2,000 Units total) by mouth daily. 90 tablet 1   Cyanocobalamin (VITAMIN B-12 CR PO) Take 1 tablet by mouth daily.     Dasiglucagon  HCl (ZEGALOGUE ) 0.6 MG/0.6ML SOAJ Inject 1 Act into the skin daily as needed. 0.6 mL 5   docusate sodium (COLACE) 50 MG capsule Take 50 mg by mouth daily.     empagliflozin  (JARDIANCE ) 25 MG TABS tablet TAKE 1 TABLET EVERY DAY BEFORE BREAKFAST 90 tablet 0   Finerenone  (KERENDIA ) 20 MG TABS Take 1 tablet (20 mg total) by mouth daily. [employer] Patient Assistance Medication 90 tablet 1   glucose blood (ACCU-CHEK GUIDE TEST) test strip Check blood sugars up to TID 300 each 3   HYDROcodone -acetaminophen  (NORCO/VICODIN) 5-325 MG tablet Take 1 tablet by mouth every 6 (six) hours as needed for moderate pain (pain score 4-6) or severe pain (pain score  7-10). 12 tablet 0   indapamide  (LOZOL ) 1.25 MG tablet Take 1 tablet (1.25 mg total) by mouth daily. 90 tablet 1   insulin  glargine (LANTUS  SOLOSTAR) 100 UNIT/ML Solostar Pen Inject 50 Units into the skin daily.     Insulin  Pen Needle (DROPLET PEN NEEDLES) 32G X 6 MM MISC Inject 1 Needle into the skin daily. 100 each 0   olmesartan  (BENICAR ) 20 MG tablet TAKE 1 TABLET EVERY DAY 90 tablet 3   polyethylene glycol powder (GLYCOLAX/MIRALAX) 17 GM/SCOOP powder Take 119 g by mouth daily.     triamcinolone  cream (KENALOG ) 0.1 % Apply 1 Application topically 2 (two) times daily. 80 g 2   No current facility-administered medications on file prior to visit.    SH:  Smoking No    PHYSICAL EXAMINATION:    BP 127/74 (BP Location: Left Arm, Patient Position: Sitting, Cuff Size: Large)   Pulse 76   Ht 5' 9 (1.753 m)   Wt (!) 300 lb 12.8 oz (136.4 kg)   SpO2 100%   BMI 44.42 kg/m     General appearance: alert, cooperative and appears stated age CV:  Regular rate and rhythm Lungs:  clear to auscultation, no wheezes, rales or rhonchi, symmetric air entry   Assessment/Plan: 1. Status post hysteroscopy (Primary) - Doing well.  Advised to call with any spotting/bleeding.  2. Endometrial polyp  3. Abnormal ultrasound of endometrium

## 2024-09-03 ENCOUNTER — Telehealth: Payer: Self-pay | Admitting: Pharmacist

## 2024-09-04 ENCOUNTER — Telehealth: Payer: Self-pay

## 2024-09-04 DIAGNOSIS — Z1231 Encounter for screening mammogram for malignant neoplasm of breast: Secondary | ICD-10-CM | POA: Diagnosis not present

## 2024-09-04 LAB — HM MAMMOGRAPHY

## 2024-09-04 NOTE — Telephone Encounter (Signed)
 Gave [employer] a call pt has not received reill ,spoke with representative explain they need to clarify if pt is using 60 unit or 50 units if nothing change we need to call back if it change to 50 units provider needs to send a new rx.

## 2024-09-05 NOTE — Telephone Encounter (Signed)
 Contacted patient to inform her refill form has been sent for her Lantus . Waiting for confirmation that it was received and when it should be shipped out.  Darrelyn Drum, PharmD, BCPS, CPP Clinical Pharmacist Practitioner [place] [facility] at [facility] [phone]

## 2024-09-05 NOTE — Telephone Encounter (Signed)
This has been placed in your "in box"

## 2024-09-06 ENCOUNTER — Encounter: Payer: Self-pay | Admitting: Internal Medicine

## 2024-09-06 ENCOUNTER — Other Ambulatory Visit: Payer: Self-pay | Admitting: Internal Medicine

## 2024-09-06 DIAGNOSIS — N1831 Chronic kidney disease, stage 3a: Secondary | ICD-10-CM

## 2024-09-06 NOTE — Telephone Encounter (Signed)
 Requested pharmacy is [facility], not listed in pharmacy directory

## 2024-09-06 NOTE — Telephone Encounter (Signed)
 Copied from CRM [id number]. Topic: Clinical - Medication Refill >> Sep 06, 2024  4:49 PM Hadassah PARAS wrote: Medication: Finerenone  (KERENDIA ) 20 MG TABS  Has the patient contacted their pharmacy? Yes (Agent: If no, request that the patient contact the pharmacy for the refill. If patient does not wish to contact the pharmacy document the reason why and proceed with request.) (Agent: If yes, when and what did the pharmacy advise?)  This is the patient's preferred pharmacy:   [facility] [address] [place], [place] [address] [phone]  Is this the correct pharmacy for this prescription? Yes If no, delete pharmacy and type the correct one.   Has the prescription been filled recently? No  Is the patient out of the medication? unknown  Has the patient been seen for an appointment in the last year OR does the patient have an upcoming appointment? Yes  Can we respond through MyChart? No, please advice [phone]  Agent: Please be advised that Rx refills may take up to 3 business days. We ask that you follow-up with your pharmacy.

## 2024-09-09 NOTE — Telephone Encounter (Signed)
 Call [employer] to verify they received provider updated reorder form on Lantus ,spoke with representative explain med is in progress to mail out it will take 7-10 days to arrived.

## 2024-09-14 ENCOUNTER — Other Ambulatory Visit: Payer: Self-pay | Admitting: Internal Medicine

## 2024-09-14 DIAGNOSIS — I10 Essential (primary) hypertension: Secondary | ICD-10-CM

## 2024-09-14 MED ORDER — KERENDIA 20 MG PO TABS: 1.0000 | ORAL_TABLET | Freq: Every day | ORAL | 1 refills | Status: DC

## 2024-09-16 ENCOUNTER — Telehealth: Payer: Self-pay

## 2024-09-16 MED ORDER — TOUJEO MAX SOLOSTAR 300 UNIT/ML ~~LOC~~ SOPN: 50.0000 [IU] | PEN_INJECTOR | Freq: Every day | SUBCUTANEOUS | Status: AC

## 2024-09-16 MED ORDER — BASAGLAR KWIKPEN 100 UNIT/ML ~~LOC~~ SOPN: 50.0000 [IU] | PEN_INJECTOR | Freq: Every day | SUBCUTANEOUS | Status: AC

## 2024-09-16 NOTE — Telephone Encounter (Signed)
 Contacted [employer] regarding patient's Lantus  shipment. Spoke to representative who informed me her Lantus  has not been shipped because they have limited availability and they are shipping as they come available. Pt's Lantus  was set to ship 09/05/24 but has not yet been shipped.  We do have Toujeo  sample available that will last her 36 days while waiting for her Lantus  to arrive.   Initiated re-enrollment applications for Jardiance  and Kerendia . Will be switching Lantus  to Basaglar  for PAP - medication is the same, just different brand, therefore dose will remain the same. Leaving applications at the front desk for her to sign.  Darrelyn Drum, PharmD, BCPS, CPP Clinical Pharmacist Practitioner [place] [facility] at [facility] [phone]

## 2024-09-16 NOTE — Telephone Encounter (Signed)
 Gave pt a call pt is coming up due for re-enrollment on [employer] (Jardiance ) and [employer](Lantus ) left a HIPAA VM.

## 2024-09-16 NOTE — Telephone Encounter (Signed)
 Pt call back explain [facility] Cristy spoke with her this morning and have left pap at the front desk at provider office ,pt is aware and will come in tomorrow around 11am.

## 2024-09-23 NOTE — Telephone Encounter (Signed)
 Completed applications for Jardiance , Kerendia , and Basaglar  have been received and indexed to pt's media. Spreadsheet has been updated.

## 2024-09-27 ENCOUNTER — Other Ambulatory Visit: Payer: Self-pay | Admitting: Internal Medicine

## 2024-09-27 ENCOUNTER — Telehealth: Payer: Self-pay | Admitting: Pharmacist

## 2024-09-27 DIAGNOSIS — E118 Type 2 diabetes mellitus with unspecified complications: Secondary | ICD-10-CM

## 2024-09-27 DIAGNOSIS — E1122 Type 2 diabetes mellitus with diabetic chronic kidney disease: Secondary | ICD-10-CM

## 2024-09-27 NOTE — Telephone Encounter (Signed)
 Pt called regarding Kerendia  - she notes she called yesterday because she needs a refill sent from PAP however she notes they told her they need a new prescription sent. PAP renewal was faxed 09/23/24 and included an Rx on the application. Unsure if they need a separate Rx now.  Pt also asks about Basaglar  and how long that should take to get a shipment in to ensure she does not run out of insulin . This PAP application was also faxed on 09/23/24.  Will asks med assistance team to reach out regarding these.  Darrelyn Drum, PharmD, BCPS, CPP Clinical Pharmacist Practitioner [place] [facility] at [facility] [phone]

## 2024-09-30 MED ORDER — KERENDIA 20 MG PO TABS: 1.0000 | ORAL_TABLET | Freq: Every day | ORAL | 4 refills | Status: AC

## 2024-09-30 NOTE — Telephone Encounter (Signed)
 Call [facility] they started process to mail out pt med Basaglar  on 11/13 pt should received with in 7-10 days.

## 2024-09-30 NOTE — Progress Notes (Signed)
 Contacted [facility] phone number to call in an Rx for Kerendia  for patient to receive through PAP. Should take about a week to get to the patient. Called patient to inform her of this update. No answer, left VM with direct call back number.  Darrelyn Drum, PharmD, BCPS, CPP Clinical Pharmacist Practitioner [place] [facility] at [facility] [phone]

## 2024-10-03 ENCOUNTER — Telehealth: Payer: Self-pay

## 2024-10-03 NOTE — Telephone Encounter (Signed)
 Unable to reach the patient. Left a very detailed message about her medication being delivered in the office and she can pick it up. The medication has been placed in the fridge

## 2024-10-25 ENCOUNTER — Telehealth: Payer: Self-pay | Admitting: Pharmacist

## 2024-10-28 NOTE — Telephone Encounter (Signed)
 Per [facility] Cristy [employer] is requesting proof of income from pt left a HIPAA VM at pt number.

## 2024-10-29 NOTE — Telephone Encounter (Signed)
 Received letter that [employer] is requiring income verification for Jardiance  PAP.  Asked Med access team to outreach patient regarding this.    Darrelyn Drum, PharmD, BCPS, CPP Clinical Pharmacist Practitioner [place] [facility] at [facility] [phone]

## 2024-12-31 ENCOUNTER — Ambulatory Visit: Admitting: Internal Medicine

## 2025-02-27 ENCOUNTER — Ambulatory Visit

## 2025-04-02 ENCOUNTER — Ambulatory Visit
